# Patient Record
Sex: Male | Born: 1945 | Race: White | Hispanic: No | Marital: Married | Smoking: Former smoker
Health system: Southern US, Community
[De-identification: ages and names within clinical notes are randomized; demographics above are authoritative.]

## PROBLEM LIST (undated history)

## (undated) DIAGNOSIS — R911 Solitary pulmonary nodule: Secondary | ICD-10-CM

## (undated) DIAGNOSIS — I10 Essential (primary) hypertension: Secondary | ICD-10-CM

## (undated) DIAGNOSIS — I495 Sick sinus syndrome: Secondary | ICD-10-CM

## (undated) DIAGNOSIS — I499 Cardiac arrhythmia, unspecified: Secondary | ICD-10-CM

## (undated) DIAGNOSIS — I6529 Occlusion and stenosis of unspecified carotid artery: Secondary | ICD-10-CM

## (undated) DIAGNOSIS — C20 Malignant neoplasm of rectum: Secondary | ICD-10-CM

## (undated) DIAGNOSIS — I219 Acute myocardial infarction, unspecified: Secondary | ICD-10-CM

## (undated) DIAGNOSIS — R569 Unspecified convulsions: Secondary | ICD-10-CM

## (undated) DIAGNOSIS — E875 Hyperkalemia: Secondary | ICD-10-CM

## (undated) DIAGNOSIS — J45909 Unspecified asthma, uncomplicated: Secondary | ICD-10-CM

## (undated) DIAGNOSIS — I739 Peripheral vascular disease, unspecified: Secondary | ICD-10-CM

## (undated) DIAGNOSIS — M1990 Unspecified osteoarthritis, unspecified site: Secondary | ICD-10-CM

## (undated) DIAGNOSIS — J449 Chronic obstructive pulmonary disease, unspecified: Secondary | ICD-10-CM

## (undated) DIAGNOSIS — H269 Unspecified cataract: Secondary | ICD-10-CM

## (undated) DIAGNOSIS — I209 Angina pectoris, unspecified: Secondary | ICD-10-CM

## (undated) DIAGNOSIS — B029 Zoster without complications: Secondary | ICD-10-CM

## (undated) DIAGNOSIS — I2510 Atherosclerotic heart disease of native coronary artery without angina pectoris: Secondary | ICD-10-CM

## (undated) DIAGNOSIS — E785 Hyperlipidemia, unspecified: Secondary | ICD-10-CM

## (undated) DIAGNOSIS — I4892 Unspecified atrial flutter: Secondary | ICD-10-CM

## (undated) DIAGNOSIS — F419 Anxiety disorder, unspecified: Secondary | ICD-10-CM

## (undated) DIAGNOSIS — C61 Malignant neoplasm of prostate: Secondary | ICD-10-CM

## (undated) DIAGNOSIS — R55 Syncope and collapse: Secondary | ICD-10-CM

## (undated) HISTORY — DX: Atherosclerotic heart disease of native coronary artery without angina pectoris: I25.10

## (undated) HISTORY — DX: Essential (primary) hypertension: I10

## (undated) HISTORY — DX: Malignant neoplasm of rectum: C20

## (undated) HISTORY — DX: Peripheral vascular disease, unspecified: I73.9

## (undated) HISTORY — PX: KNEE ARTHROSCOPY: SUR90

## (undated) HISTORY — DX: Sick sinus syndrome: I49.5

## (undated) HISTORY — DX: Occlusion and stenosis of unspecified carotid artery: I65.29

## (undated) HISTORY — DX: Zoster without complications: B02.9

## (undated) HISTORY — DX: Unspecified atrial flutter: I48.92

## (undated) HISTORY — DX: Chronic obstructive pulmonary disease, unspecified: J44.9

## (undated) HISTORY — PX: CORONARY ANGIOPLASTY: SHX604

## (undated) HISTORY — PX: OTHER SURGICAL HISTORY: SHX169

## (undated) HISTORY — PX: CATARACT EXTRACTION: SUR2

## (undated) HISTORY — DX: Malignant neoplasm of prostate: C61

## (undated) HISTORY — DX: Syncope and collapse: R55

## (undated) HISTORY — DX: Solitary pulmonary nodule: R91.1

## (undated) HISTORY — PX: COLON SURGERY: SHX602

## (undated) HISTORY — DX: Hyperlipidemia, unspecified: E78.5

## (undated) HISTORY — DX: Unspecified cataract: H26.9

---

## 2003-10-31 ENCOUNTER — Ambulatory Visit: Payer: Self-pay | Admitting: Internal Medicine

## 2003-12-02 ENCOUNTER — Ambulatory Visit: Payer: Self-pay | Admitting: Internal Medicine

## 2003-12-31 ENCOUNTER — Ambulatory Visit: Payer: Self-pay | Admitting: Internal Medicine

## 2004-02-24 ENCOUNTER — Ambulatory Visit: Payer: Self-pay | Admitting: Internal Medicine

## 2004-03-02 ENCOUNTER — Ambulatory Visit: Payer: Self-pay | Admitting: Internal Medicine

## 2004-04-20 ENCOUNTER — Ambulatory Visit: Payer: Self-pay | Admitting: Internal Medicine

## 2004-04-30 ENCOUNTER — Ambulatory Visit: Payer: Self-pay | Admitting: Internal Medicine

## 2004-05-30 ENCOUNTER — Ambulatory Visit: Payer: Self-pay | Admitting: Internal Medicine

## 2004-06-30 ENCOUNTER — Ambulatory Visit: Payer: Self-pay | Admitting: Internal Medicine

## 2004-07-30 ENCOUNTER — Ambulatory Visit: Payer: Self-pay | Admitting: Internal Medicine

## 2004-09-07 ENCOUNTER — Ambulatory Visit: Payer: Self-pay | Admitting: Internal Medicine

## 2004-09-30 ENCOUNTER — Ambulatory Visit: Payer: Self-pay | Admitting: Internal Medicine

## 2004-10-30 ENCOUNTER — Ambulatory Visit: Payer: Self-pay | Admitting: Internal Medicine

## 2004-12-01 ENCOUNTER — Ambulatory Visit: Payer: Self-pay | Admitting: Internal Medicine

## 2004-12-30 ENCOUNTER — Ambulatory Visit: Payer: Self-pay | Admitting: Internal Medicine

## 2005-01-30 ENCOUNTER — Ambulatory Visit: Payer: Self-pay | Admitting: Internal Medicine

## 2005-03-02 ENCOUNTER — Other Ambulatory Visit: Payer: Self-pay

## 2005-03-03 ENCOUNTER — Inpatient Hospital Stay: Payer: Self-pay | Admitting: Internal Medicine

## 2005-03-03 ENCOUNTER — Other Ambulatory Visit: Payer: Self-pay

## 2005-03-09 ENCOUNTER — Ambulatory Visit: Payer: Self-pay | Admitting: Internal Medicine

## 2005-03-21 ENCOUNTER — Other Ambulatory Visit: Payer: Self-pay

## 2005-03-21 ENCOUNTER — Inpatient Hospital Stay: Payer: Self-pay | Admitting: Cardiology

## 2005-03-22 ENCOUNTER — Other Ambulatory Visit: Payer: Self-pay

## 2005-03-30 ENCOUNTER — Ambulatory Visit: Payer: Self-pay | Admitting: Internal Medicine

## 2005-04-30 ENCOUNTER — Ambulatory Visit: Payer: Self-pay | Admitting: Internal Medicine

## 2005-05-30 ENCOUNTER — Ambulatory Visit: Payer: Self-pay | Admitting: Internal Medicine

## 2005-06-30 ENCOUNTER — Ambulatory Visit: Payer: Self-pay | Admitting: Internal Medicine

## 2005-07-29 ENCOUNTER — Inpatient Hospital Stay: Payer: Self-pay | Admitting: Internal Medicine

## 2005-07-29 ENCOUNTER — Other Ambulatory Visit: Payer: Self-pay

## 2005-07-30 ENCOUNTER — Ambulatory Visit: Payer: Self-pay | Admitting: Internal Medicine

## 2005-08-30 ENCOUNTER — Ambulatory Visit: Payer: Self-pay | Admitting: Internal Medicine

## 2005-09-30 ENCOUNTER — Ambulatory Visit: Payer: Self-pay | Admitting: Internal Medicine

## 2005-10-30 ENCOUNTER — Ambulatory Visit: Payer: Self-pay | Admitting: Internal Medicine

## 2005-11-30 ENCOUNTER — Ambulatory Visit: Payer: Self-pay | Admitting: Internal Medicine

## 2005-12-30 ENCOUNTER — Ambulatory Visit: Payer: Self-pay | Admitting: Internal Medicine

## 2006-02-07 ENCOUNTER — Ambulatory Visit: Payer: Self-pay | Admitting: Internal Medicine

## 2006-03-02 ENCOUNTER — Ambulatory Visit: Payer: Self-pay | Admitting: Internal Medicine

## 2006-03-31 ENCOUNTER — Ambulatory Visit: Payer: Self-pay | Admitting: Internal Medicine

## 2006-04-05 ENCOUNTER — Other Ambulatory Visit: Payer: Self-pay

## 2006-04-05 ENCOUNTER — Ambulatory Visit: Payer: Self-pay | Admitting: Internal Medicine

## 2006-05-01 ENCOUNTER — Ambulatory Visit: Payer: Self-pay | Admitting: Internal Medicine

## 2006-05-31 ENCOUNTER — Ambulatory Visit: Payer: Self-pay | Admitting: Internal Medicine

## 2006-07-01 ENCOUNTER — Ambulatory Visit: Payer: Self-pay | Admitting: Internal Medicine

## 2006-07-31 ENCOUNTER — Ambulatory Visit: Payer: Self-pay | Admitting: Internal Medicine

## 2006-09-05 ENCOUNTER — Ambulatory Visit: Payer: Self-pay | Admitting: Internal Medicine

## 2006-10-01 ENCOUNTER — Ambulatory Visit: Payer: Self-pay | Admitting: Internal Medicine

## 2006-10-17 ENCOUNTER — Ambulatory Visit: Payer: Self-pay | Admitting: Internal Medicine

## 2006-10-31 ENCOUNTER — Ambulatory Visit: Payer: Self-pay | Admitting: Internal Medicine

## 2006-12-01 ENCOUNTER — Ambulatory Visit: Payer: Self-pay | Admitting: Internal Medicine

## 2006-12-31 ENCOUNTER — Ambulatory Visit: Payer: Self-pay | Admitting: Internal Medicine

## 2007-01-31 ENCOUNTER — Ambulatory Visit: Payer: Self-pay | Admitting: Internal Medicine

## 2007-02-18 ENCOUNTER — Ambulatory Visit: Payer: Self-pay | Admitting: Internal Medicine

## 2007-03-03 ENCOUNTER — Ambulatory Visit: Payer: Self-pay | Admitting: Internal Medicine

## 2007-03-15 ENCOUNTER — Ambulatory Visit: Payer: Self-pay | Admitting: Surgery

## 2007-03-31 ENCOUNTER — Ambulatory Visit: Payer: Self-pay | Admitting: Internal Medicine

## 2007-05-01 ENCOUNTER — Ambulatory Visit: Payer: Self-pay | Admitting: Internal Medicine

## 2007-05-31 ENCOUNTER — Ambulatory Visit: Payer: Self-pay | Admitting: Internal Medicine

## 2007-07-01 ENCOUNTER — Ambulatory Visit: Payer: Self-pay | Admitting: Internal Medicine

## 2007-07-23 ENCOUNTER — Ambulatory Visit: Payer: Self-pay | Admitting: Internal Medicine

## 2007-07-31 ENCOUNTER — Ambulatory Visit: Payer: Self-pay | Admitting: Internal Medicine

## 2007-08-31 ENCOUNTER — Ambulatory Visit: Payer: Self-pay | Admitting: Internal Medicine

## 2007-09-25 ENCOUNTER — Ambulatory Visit: Payer: Self-pay | Admitting: Internal Medicine

## 2008-07-27 ENCOUNTER — Emergency Department: Payer: Self-pay | Admitting: Internal Medicine

## 2008-08-18 ENCOUNTER — Ambulatory Visit: Payer: Self-pay | Admitting: Family Medicine

## 2009-01-30 HISTORY — PX: CARDIAC SURGERY: SHX584

## 2009-01-30 HISTORY — PX: INSERT / REPLACE / REMOVE PACEMAKER: SUR710

## 2009-07-30 HISTORY — PX: CARDIAC CATHETERIZATION: SHX172

## 2009-08-07 ENCOUNTER — Inpatient Hospital Stay: Payer: Self-pay | Admitting: *Deleted

## 2010-01-13 ENCOUNTER — Ambulatory Visit: Payer: Self-pay | Admitting: Cardiology

## 2010-01-17 ENCOUNTER — Ambulatory Visit: Payer: Self-pay | Admitting: Cardiology

## 2010-01-17 HISTORY — PX: PACEMAKER PLACEMENT: SHX43

## 2010-01-30 HISTORY — PX: OTHER SURGICAL HISTORY: SHX169

## 2010-08-05 ENCOUNTER — Ambulatory Visit: Payer: Self-pay | Admitting: Oncology

## 2010-08-31 ENCOUNTER — Ambulatory Visit: Payer: Self-pay | Admitting: Oncology

## 2010-09-30 ENCOUNTER — Ambulatory Visit: Payer: Self-pay | Admitting: Surgery

## 2010-10-05 LAB — PATHOLOGY REPORT

## 2010-10-19 ENCOUNTER — Ambulatory Visit: Payer: Self-pay | Admitting: Ophthalmology

## 2010-10-31 ENCOUNTER — Ambulatory Visit: Payer: Self-pay | Admitting: Ophthalmology

## 2011-09-20 ENCOUNTER — Ambulatory Visit: Payer: Self-pay | Admitting: Family Medicine

## 2011-11-01 ENCOUNTER — Emergency Department: Payer: Self-pay | Admitting: Emergency Medicine

## 2012-03-27 ENCOUNTER — Ambulatory Visit: Payer: Self-pay | Admitting: Specialist

## 2012-05-25 ENCOUNTER — Emergency Department: Payer: Self-pay | Admitting: Emergency Medicine

## 2012-05-25 LAB — COMPREHENSIVE METABOLIC PANEL
Alkaline Phosphatase: 78 U/L (ref 50–136)
BUN: 13 mg/dL (ref 7–18)
Bilirubin,Total: 0.4 mg/dL (ref 0.2–1.0)
Calcium, Total: 8.4 mg/dL — ABNORMAL LOW (ref 8.5–10.1)
Co2: 25 mmol/L (ref 21–32)
Creatinine: 0.71 mg/dL (ref 0.60–1.30)
EGFR (African American): >60
EGFR (Non-African Amer.): >60
Osmolality: 275 (ref 275–301)
Potassium: 3.7 mmol/L (ref 3.5–5.1)
SGPT (ALT): 36 U/L (ref 12–78)
Sodium: 137 mmol/L (ref 136–145)
Total Protein: 6.7 g/dL (ref 6.4–8.2)

## 2012-05-25 LAB — CBC
HCT: 43.8 % (ref 40.0–52.0)
HGB: 15.3 g/dL (ref 13.0–18.0)
MCHC: 35 g/dL (ref 32.0–36.0)
MCV: 95 fL (ref 80–100)
Platelet: 197 10*3/uL (ref 150–440)
RBC: 4.62 10*6/uL (ref 4.40–5.90)
RDW: 14.4 % (ref 11.5–14.5)

## 2012-05-25 LAB — URINALYSIS, COMPLETE
Bacteria: NONE SEEN
Blood: NEGATIVE
Glucose,UR: NEGATIVE mg/dL (ref 0–75)
Leukocyte Esterase: NEGATIVE
Nitrite: NEGATIVE
Ph: 5 (ref 4.5–8.0)
Protein: NEGATIVE
Specific Gravity: 1.016 (ref 1.003–1.030)

## 2012-05-25 LAB — TROPONIN I: Troponin-I: <0.02 ng/mL

## 2012-05-25 LAB — LIPASE, BLOOD: Lipase: 91 U/L (ref 73–393)

## 2012-10-10 ENCOUNTER — Ambulatory Visit: Payer: Self-pay | Admitting: Specialist

## 2012-10-12 ENCOUNTER — Observation Stay: Payer: Self-pay | Admitting: Student

## 2012-10-12 LAB — COMPREHENSIVE METABOLIC PANEL
Anion Gap: 4 — ABNORMAL LOW (ref 7–16)
Bilirubin,Total: 0.4 mg/dL (ref 0.2–1.0)
Calcium, Total: 9.1 mg/dL (ref 8.5–10.1)
Co2: 27 mmol/L (ref 21–32)
Glucose: 137 mg/dL — ABNORMAL HIGH (ref 65–99)
Osmolality: 277 (ref 275–301)
SGOT(AST): 35 U/L (ref 15–37)
SGPT (ALT): 40 U/L (ref 12–78)
Sodium: 138 mmol/L (ref 136–145)
Total Protein: 7.4 g/dL (ref 6.4–8.2)

## 2012-10-12 LAB — CBC
MCH: 31.9 pg (ref 26.0–34.0)
MCHC: 34.2 g/dL (ref 32.0–36.0)
MCV: 93 fL (ref 80–100)
Platelet: 201 10*3/uL (ref 150–440)
RBC: 4.93 10*6/uL (ref 4.40–5.90)
RDW: 13.3 % (ref 11.5–14.5)
WBC: 6.7 10*3/uL (ref 3.8–10.6)

## 2012-10-13 LAB — CBC WITH DIFFERENTIAL/PLATELET
Basophil #: 0 10*3/uL (ref 0.0–0.1)
Eosinophil %: 0 %
HCT: 47.7 % (ref 40.0–52.0)
Lymphocyte %: 4.3 %
MCH: 32.6 pg (ref 26.0–34.0)
MCHC: 34.7 g/dL (ref 32.0–36.0)
Monocyte #: 0.2 x10 3/mm (ref 0.2–1.0)
Monocyte %: 2.4 %
Neutrophil %: 93.2 %
RBC: 5.07 10*6/uL (ref 4.40–5.90)
RDW: 13.1 % (ref 11.5–14.5)
WBC: 9.6 10*3/uL (ref 3.8–10.6)

## 2012-10-13 LAB — BASIC METABOLIC PANEL
BUN: 17 mg/dL (ref 7–18)
Calcium, Total: 9.1 mg/dL (ref 8.5–10.1)
Chloride: 103 mmol/L (ref 98–107)
EGFR (Non-African Amer.): >60
Glucose: 154 mg/dL — ABNORMAL HIGH (ref 65–99)
Potassium: 3.9 mmol/L (ref 3.5–5.1)
Sodium: 135 mmol/L — ABNORMAL LOW (ref 136–145)

## 2013-01-15 ENCOUNTER — Emergency Department: Payer: Self-pay | Admitting: Emergency Medicine

## 2013-01-15 LAB — TROPONIN I: Troponin-I: <0.02 ng/mL

## 2013-01-15 LAB — BASIC METABOLIC PANEL
Calcium, Total: 9.1 mg/dL (ref 8.5–10.1)
Chloride: 105 mmol/L (ref 98–107)
Co2: 25 mmol/L (ref 21–32)
Creatinine: 0.82 mg/dL (ref 0.60–1.30)
EGFR (African American): >60
Glucose: 118 mg/dL — ABNORMAL HIGH (ref 65–99)
Osmolality: 275 (ref 275–301)
Potassium: 4.3 mmol/L (ref 3.5–5.1)

## 2013-01-15 LAB — PRO B NATRIURETIC PEPTIDE: B-Type Natriuretic Peptide: 138 pg/mL — ABNORMAL HIGH (ref 0–125)

## 2013-01-15 LAB — CBC
MCH: 31.9 pg (ref 26.0–34.0)
MCV: 93 fL (ref 80–100)
WBC: 6.9 10*3/uL (ref 3.8–10.6)

## 2013-01-30 DIAGNOSIS — I219 Acute myocardial infarction, unspecified: Secondary | ICD-10-CM

## 2013-01-30 HISTORY — PX: CARDIAC CATHETERIZATION: SHX172

## 2013-01-30 HISTORY — DX: Acute myocardial infarction, unspecified: I21.9

## 2013-02-18 ENCOUNTER — Ambulatory Visit: Payer: Self-pay | Admitting: Internal Medicine

## 2013-02-18 LAB — BASIC METABOLIC PANEL
Anion Gap: 3 — ABNORMAL LOW (ref 7–16)
BUN: 18 mg/dL (ref 7–18)
CHLORIDE: 106 mmol/L (ref 98–107)
Calcium, Total: 9.2 mg/dL (ref 8.5–10.1)
Co2: 28 mmol/L (ref 21–32)
Creatinine: 0.8 mg/dL (ref 0.60–1.30)
EGFR (African American): >60
GLUCOSE: 91 mg/dL (ref 65–99)
OSMOLALITY: 275 (ref 275–301)
Potassium: 4.8 mmol/L (ref 3.5–5.1)
SODIUM: 137 mmol/L (ref 136–145)

## 2013-03-28 ENCOUNTER — Encounter: Payer: Self-pay | Admitting: Pulmonary Disease

## 2013-03-28 ENCOUNTER — Ambulatory Visit (INDEPENDENT_AMBULATORY_CARE_PROVIDER_SITE_OTHER): Payer: Medicare Other | Admitting: Pulmonary Disease

## 2013-03-28 VITALS — BP 142/74 | HR 90 | Ht 69.0 in | Wt 229.0 lb

## 2013-03-28 DIAGNOSIS — J449 Chronic obstructive pulmonary disease, unspecified: Secondary | ICD-10-CM

## 2013-03-28 DIAGNOSIS — R911 Solitary pulmonary nodule: Secondary | ICD-10-CM | POA: Insufficient documentation

## 2013-03-28 DIAGNOSIS — J432 Centrilobular emphysema: Secondary | ICD-10-CM | POA: Insufficient documentation

## 2013-03-28 DIAGNOSIS — J441 Chronic obstructive pulmonary disease with (acute) exacerbation: Secondary | ICD-10-CM

## 2013-03-28 MED ORDER — ALBUTEROL SULFATE HFA 108 (90 BASE) MCG/ACT IN AERS: 2.0000 | INHALATION_SPRAY | Freq: Four times a day (QID) | RESPIRATORY_TRACT | Status: DC | PRN

## 2013-03-28 MED ORDER — ROFLUMILAST 500 MCG PO TABS: 500.0000 ug | ORAL_TABLET | Freq: Every day | ORAL | Status: DC

## 2013-03-28 NOTE — Assessment & Plan Note (Addendum)
I am very concerned about all the recurrent exacerbations Donald Juarez has experienced in his COPD lately. I do not know the severity based on spirometry but clinically he has severe disease. We need to try to identify underlying conditions that may be contributing. I first questioned MAI versus ABPA but as these are rare conditions, the most likely explanation is weight gain and effects of aging.  Plan:  -Obtain full pulmonary function test -IgE -CBC with differential with eosinophils -continue Spiriva and Advair as these are more than adequate therapy for COPD. -Add Roflumilast for ongoing severe infections

## 2013-03-28 NOTE — Assessment & Plan Note (Signed)
He has had 2 CT chests since February of 2014 which showed a 7 mm nodule which has not grown. The most recent study recommended a six-month followup. We will order a CT chest without contrast to evaluate this nodule.

## 2013-03-28 NOTE — Progress Notes (Signed)
Subjective:    Patient ID: Donald Juarez, male    DOB: 1945/06/12, 68 y.o.   MRN: [id number]  HPI  PCP: Dr. Ozella Juarez  Mr. Donald Juarez has COPD and is here to see me because started having more dyspnea in 9/204 after experiencing shigles for about 6 months.  He was atually admitted for what sounds like a COPD flare at that time. Not long after that he started having recurrent COPD flares that required admission again and visits with his PCP. He has beeen seen by his cardiology doctor and had a repeat stress and left heart cath which were abnormal.  Before 09/2012 he might have one or two episodes of pneumonia ever.  He says that when he climbs one flight of stairs he has a bad episode of dyspnea.  His wife is disabled and he does all the work around his house.  He is really struggling to keep up right now.  In addition to dyspnea he has noticed fatigue, some malaise and poor sleep quality.  He is not coughing more.  He does have more chest congestion which is clear.   He has ben noted to have a pulmonary nodule in the past and went through serial CT scans for this.    He previously smoked 1 pack per day for 30 years an quit 4 years ago.    He used moradic acid one time to clean ceramic floors in a room poorly ventilated.  This was in the 1990's, after that he has had a lot of trouble with his breathing.    He has been treated with prednisone and this really helps a lot. As soon as he comes off he starts to change worse.    He has been on symbicort and Spiriva for years. He also uses an albuterol nebulizer a few times per day and it helps.    Past Medical History  Diagnosis Date  . COPD (chronic obstructive pulmonary disease)   . Heart disease   . Hx of colon cancer, stage I   . Hypertension   . Irregular heartbeat      Family History  Problem Relation Age of Onset  . Cancer Mother     'male cancer"  . Heart disease Father      History   Social History  . Marital Status:  Married    Spouse Name: N/A    Number of Children: N/A  . Years of Education: N/A   Occupational History  . Not on file.   Social History Main Topics  . Smoking status: Former Smoker -- 1.00 packs/day for 30 years    Types: Cigarettes    Quit date: 07/26/2008  . Smokeless tobacco: Never Used  . Alcohol Use: No  . Drug Use: No  . Sexual Activity: Not on file   Other Topics Concern  . Not on file   Social History Narrative  . No narrative on file     Not on File   No outpatient prescriptions prior to visit.   No facility-administered medications prior to visit.      Review of Systems  Constitutional: Negative for fever and unexpected weight change.  HENT: Positive for congestion, postnasal drip and sinus pressure. Negative for dental problem, ear pain, nosebleeds, rhinorrhea, sneezing, sore throat and trouble swallowing.   Eyes: Negative for redness and itching.  Respiratory: Positive for cough and shortness of breath. Negative for chest tightness and wheezing.   Cardiovascular: Negative for palpitations and  leg swelling.  Gastrointestinal: Negative for nausea and vomiting.  Genitourinary: Negative for dysuria.  Musculoskeletal: Negative for joint swelling.  Skin: Negative for rash.  Neurological: Negative for headaches.  Hematological: Does not bruise/bleed easily.  Psychiatric/Behavioral: Negative for dysphoric mood. The patient is not nervous/anxious.        Objective:   Physical Exam  Filed Vitals:   03/28/13 1338 03/28/13 1431  BP: 142/74   Pulse:  90  Height: 5\' 9"  (1.753 m)   Weight: 229 lb (103.874 kg)   SpO2:  95%   Gen: well appearing, no acute distress HEENT: NCAT, PERRL, EOMi, OP clear, neck supple without masses PULM: few crackles in bases with exp wheezing CV: RRR, no mgr, no JVD AB: BS+, soft, nontender, no hsm Ext: warm, no edema, no clubbing, no cyanosis Derm: no rash or skin breakdown Neuro: A&Ox4, CN II-XII intact, strength 5/5 in  all 4 extremities        Assessment & Plan:   COPD, moderate I am very concerned about all the recurrent exacerbations Donald Juarez has experienced in his COPD lately. I do not know the severity based on spirometry but clinically he has severe disease. We need to try to identify underlying conditions that may be contributing. I first questioned MAI versus ABPA but as these are rare conditions, the most likely explanation is weight gain and effects of aging.  Plan:  -Obtain full pulmonary function test -IgE -CBC with differential with eosinophils -continue Spiriva and Advair as these are more than adequate therapy for COPD. -Add Roflumilast for ongoing severe infections  Solitary pulmonary nodule He has had 2 CT chests since February of 2014 which showed a 7 mm nodule which has not grown. The most recent study recommended a six-month followup. We will order a CT chest without contrast to evaluate this nodule.    Updated Medication List Outpatient Encounter Prescriptions as of 03/28/2013  Medication Sig  . albuterol (PROVENTIL HFA;VENTOLIN HFA) 108 (90 BASE) MCG/ACT inhaler Inhale 1-2 puffs into the lungs every 6 (six) hours as needed for wheezing or shortness of breath.  [name] albuterol (PROVENTIL) (2.5 MG/3ML) 0.083% nebulizer solution Take 2.5 mg by nebulization every 6 (six) hours as needed for wheezing or shortness of breath.  [name] apixaban (ELIQUIS) 5 MG TABS tablet Take 5 mg by mouth 2 (two) times daily.  [name] aspirin 81 MG tablet Take 81 mg by mouth daily.  [name] atorvastatin (LIPITOR) 40 MG tablet Take 40 mg by mouth daily.  . budesonide-formoterol (SYMBICORT) 160-4.5 MCG/ACT inhaler Inhale 2 puffs into the lungs 2 (two) times daily.  [name] diltiazem (TIAZAC) 300 MG 24 hr capsule Take 300 mg by mouth daily.  . isosorbide dinitrate (ISORDIL) 30 MG tablet Take 30 mg by mouth daily.  . metoprolol (LOPRESSOR) 100 MG tablet Take 100 mg by mouth 2 (two) times daily.  [name] tiotropium (SPIRIVA) 18 MCG  inhalation capsule Place 18 mcg into inhaler and inhale daily.

## 2013-03-28 NOTE — Patient Instructions (Signed)
Take the daliresp daily; if you experience upset stomach, take it every other day Keep taking your inhalers as you are doing Provide [place] three samples of mucus We will set up a lung function test for you  We will see you back in 6-8 weeks or sooner if needed (after the culture results are back)

## 2013-04-01 ENCOUNTER — Other Ambulatory Visit: Payer: Self-pay | Admitting: Pulmonary Disease

## 2013-04-01 ENCOUNTER — Ambulatory Visit: Payer: Self-pay | Admitting: Pulmonary Disease

## 2013-04-01 ENCOUNTER — Other Ambulatory Visit: Payer: Medicare Other

## 2013-04-01 LAB — PULMONARY FUNCTION TEST

## 2013-04-03 ENCOUNTER — Telehealth: Payer: Self-pay | Admitting: Pulmonary Disease

## 2013-04-03 NOTE — Telephone Encounter (Signed)
Dr. Lake Bells placed an order for a battery operated nebulizer machine with [facility] for the pt. Mandy called to let us know that they will have to order the machine for the pt. When [facility] called the pt to let him know, he got upset and told them that he didn't want to wait. Leafy Ro states that it will not take long to get this machine in. I attempted to call the pt. Left a message from him to call back and try to explain to him that it will not take long to get the machine.

## 2013-04-04 NOTE — Telephone Encounter (Signed)
Spoke with pt. He denies telling [facility] that he didn't want to wait on the battery operated neb machine. [facility] told him that they couldn't get the machine. I have cleared this up and let him know that they are ordering the machine and he should get it shortly.  Called Mandy to clarify that this can still be ordered. She states that they have already ordered this and will get to the pt as soon as they get it in. Nothing further is needed.

## 2013-04-04 NOTE — Telephone Encounter (Signed)
[id number] on cell number. Home number listed is disconnected.Crockett Bing, CMA

## 2013-04-09 ENCOUNTER — Encounter: Payer: Self-pay | Admitting: Pulmonary Disease

## 2013-04-10 ENCOUNTER — Telehealth: Payer: Self-pay

## 2013-04-10 NOTE — Telephone Encounter (Signed)
Pt aware of results and recs.  Nothing further needed. 

## 2013-04-10 NOTE — Telephone Encounter (Signed)
Message copied by Len Blalock on Thu Apr 10, 2013  2:02 PM ------      Message from: Simonne Maffucci B      Created: Wed Apr 09, 2013  9:30 PM       A,      Please let him know that his PFTs showed COPD with asthma and there is nothing new to do today.      Thanks      B ------

## 2013-05-01 ENCOUNTER — Telehealth: Payer: Self-pay | Admitting: Pulmonary Disease

## 2013-05-01 MED ORDER — ROFLUMILAST 500 MCG PO TABS: 500.0000 ug | ORAL_TABLET | Freq: Every day | ORAL | Status: DC

## 2013-05-01 MED ORDER — ALBUTEROL SULFATE (2.5 MG/3ML) 0.083% IN NEBU: 2.5000 mg | INHALATION_SOLUTION | Freq: Four times a day (QID) | RESPIRATORY_TRACT | Status: DC | PRN

## 2013-05-01 NOTE — Telephone Encounter (Signed)
Called and spoke with pt and he stated that he will need the albuterol for the neb sent in to the pharmacy.  This has been done.  Pt stated that the daliresp the pharmacy did not have this rx.  rx has been sent in for the daliresp but this may need a PA done.  Will wait to hear from the pharmacy on this med.

## 2013-05-14 ENCOUNTER — Telehealth: Payer: Self-pay | Admitting: *Deleted

## 2013-05-14 LAB — AFB CULTURE WITH SMEAR (NOT AT ARMC): Acid Fast Smear: NONE SEEN

## 2013-05-14 NOTE — Telephone Encounter (Signed)
Called and did the PA for the daliresp 500 mg  1 tablet daily.  This has been approved until 05-16-2014.  i have called the pharmacy and they will get this ready for the pt.  Nothing further is needed.

## 2013-05-16 LAB — AFB CULTURE WITH SMEAR (NOT AT ARMC): ACID FAST SMEAR: NONE SEEN

## 2013-05-28 ENCOUNTER — Encounter: Payer: Self-pay | Admitting: Pulmonary Disease

## 2013-05-28 ENCOUNTER — Ambulatory Visit (INDEPENDENT_AMBULATORY_CARE_PROVIDER_SITE_OTHER): Payer: Medicare Other | Admitting: Pulmonary Disease

## 2013-05-28 VITALS — BP 138/82 | HR 107 | Ht 69.0 in | Wt 223.0 lb

## 2013-05-28 DIAGNOSIS — J45901 Unspecified asthma with (acute) exacerbation: Secondary | ICD-10-CM

## 2013-05-28 DIAGNOSIS — J449 Chronic obstructive pulmonary disease, unspecified: Secondary | ICD-10-CM

## 2013-05-28 DIAGNOSIS — R911 Solitary pulmonary nodule: Secondary | ICD-10-CM

## 2013-05-28 MED ORDER — AEROCHAMBER MV MISC: Status: DC

## 2013-05-28 NOTE — Progress Notes (Signed)
Subjective:    Patient ID: Donald Juarez, male    DOB: 01-Mar-1945, 68 y.o.   MRN: [id number]  Synopsis: Mr. Belson first saw the [facility] clinic in 2014 for COPD after smoking 1 PPD for 30 years and quitting in 2010.  He has frequent exacerbations.    03/2013 Full PFT> Ratio 53% FEV1 1.89 (65% pred, 13% change), TLC 5.83 L (93% pred), DLCO 16.6 (69% pred)  HPI   05/28/2013 [name] > Since the last visit Javon ended up in an urgent care facility again with dyspnea, runny nose, cough, sinus congestion and itching eyes.  He was put on a prednisone taper, flonase.  He now feels much better.  He was doing Ok prior to this and felt like his symptoms had initially improved after our last visit.  For some reason he did not get his lab work.  He continues to cough up clear mucus.  He has been using his albuterol more in the last week or so with the sinus symptoms.  He has noticed increasing dyspnea with walking to the mailbox.  He has been taking his Symbicort twice a day but feels like sometimes he "doesn't get any" when he uses the inhaler.  Past Medical History  Diagnosis Date  . COPD (chronic obstructive pulmonary disease)   . Heart disease   . Hx of colon cancer, stage I   . Hypertension   . Irregular heartbeat      Review of Systems  Constitutional: Positive for fatigue. Negative for fever and chills.  HENT: Positive for postnasal drip, rhinorrhea and sinus pressure.   Respiratory: Positive for cough, shortness of breath and wheezing.   Cardiovascular: Negative for chest pain, palpitations and leg swelling.       Objective:   Physical Exam Filed Vitals:   05/28/13 0937  BP: 138/82  Pulse: 107  Height: 5\' 9"  (1.753 m)  Weight: 223 lb (101.152 kg)  SpO2: 96%    RA  Gen: well appearing, no acute distress HEENT: NCAT, EOMi, OP clear PULM: few exp wheezes, good air movement CV: RRR, no mgr, no JVD AB: BS+, soft, nontender Ext: warm, no edema, no clubbing, no  cyanosis Derm: no rash or skin breakdown Neuro: A&Ox4, MAEW       Assessment & Plan:   Solitary pulmonary nodule For some reason he did not have the CT chest we ordered after the last visit (nor did he have the blood work).  Will order after next visit.  COPD with asthma I am concerned about his ongoing exacerbations (though the most recent seemed more sinus related).  He is not interested in [facility] due to cost.  I explained the importance of adherence to his medication regimen, but that he needed to stick to the inhalers.  I wonder if he is really getting anything from the Symbicort with is current strategy.  Will add a spacer to see if that helps with drug delivery.  Plan: -continue prednisone taper -2 weeks after prednisone taper, get cbc with diff and IgE -add spacer to symbicort -continue Spiriva -discuss restarting Daliresp (applying for assistance) next visit    Updated Medication List Outpatient Encounter Prescriptions as of 05/28/2013  Medication Sig  . albuterol (PROAIR HFA) 108 (90 BASE) MCG/ACT inhaler Inhale 2 puffs into the lungs every 6 (six) hours as needed for wheezing or shortness of breath.  [name] albuterol (PROVENTIL) (2.5 MG/3ML) 0.083% nebulizer solution Take 3 mLs (2.5 mg total) by nebulization every  6 (six) hours as needed for wheezing or shortness of breath.  [name] apixaban (ELIQUIS) 5 MG TABS tablet Take 5 mg by mouth 2 (two) times daily.  [name] aspirin 81 MG tablet Take 81 mg by mouth daily.  [name] atorvastatin (LIPITOR) 40 MG tablet Take 40 mg by mouth daily.  . budesonide-formoterol (SYMBICORT) 160-4.5 MCG/ACT inhaler Inhale 2 puffs into the lungs 2 (two) times daily.  [name] diltiazem (TIAZAC) 300 MG 24 hr capsule Take 300 mg by mouth daily.  . fluticasone (FLONASE) 50 MCG/ACT nasal spray Place 2 sprays into both nostrils daily.  . isosorbide dinitrate (ISORDIL) 30 MG tablet Take 30 mg by mouth daily.  . metoprolol (LOPRESSOR) 100 MG tablet Take 100 mg by mouth 2 (two)  times daily.  . predniSONE (DELTASONE) 10 MG tablet Taper as directed  . tiotropium (SPIRIVA) 18 MCG inhalation capsule Place 18 mcg into inhaler and inhale daily.  . [DISCONTINUED] roflumilast (DALIRESP) 500 MCG TABS tablet Take 1 tablet (500 mcg total) by mouth daily.

## 2013-05-28 NOTE — Patient Instructions (Signed)
Take your symbicort with a spacer device two puffs twice a day no matter how you feel Continue using the symbicort for two weeks after completing the prednisone taper and let us know how it is working for you Get blood work done at our office two weeks after completing the prednisone We will see you back in 6 weeks

## 2013-05-29 NOTE — Assessment & Plan Note (Addendum)
I am concerned about his ongoing exacerbations (though the most recent seemed more sinus related).  He is not interested in [facility] due to cost.  I explained the importance of adherence to his medication regimen, but that he needed to stick to the inhalers.  I wonder if he is really getting anything from the Symbicort with is current strategy.  Will add a spacer to see if that helps with drug delivery.  Plan: -continue prednisone taper -2 weeks after prednisone taper, get cbc with diff and IgE -add spacer to symbicort -continue Spiriva -discuss restarting Daliresp (applying for assistance) next visit

## 2013-05-29 NOTE — Assessment & Plan Note (Signed)
For some reason he did not have the CT chest we ordered after the last visit (nor did he have the blood work).  Will order after next visit.

## 2013-06-02 ENCOUNTER — Encounter: Payer: Self-pay | Admitting: Pulmonary Disease

## 2013-06-24 ENCOUNTER — Telehealth: Payer: Self-pay | Admitting: Pulmonary Disease

## 2013-06-24 ENCOUNTER — Other Ambulatory Visit (INDEPENDENT_AMBULATORY_CARE_PROVIDER_SITE_OTHER): Payer: Medicare Other

## 2013-06-24 ENCOUNTER — Encounter: Payer: Self-pay | Admitting: Pulmonary Disease

## 2013-06-24 ENCOUNTER — Ambulatory Visit (INDEPENDENT_AMBULATORY_CARE_PROVIDER_SITE_OTHER): Payer: Medicare Other | Admitting: Pulmonary Disease

## 2013-06-24 VITALS — BP 140/72 | HR 121 | Ht 69.0 in | Wt 225.0 lb

## 2013-06-24 DIAGNOSIS — R911 Solitary pulmonary nodule: Secondary | ICD-10-CM

## 2013-06-24 DIAGNOSIS — J449 Chronic obstructive pulmonary disease, unspecified: Secondary | ICD-10-CM

## 2013-06-24 LAB — CBC WITH DIFFERENTIAL/PLATELET
BASOS PCT: 0.2 % (ref 0.0–3.0)
Basophils Absolute: 0 10*3/uL (ref 0.0–0.1)
EOS PCT: 1.5 % (ref 0.0–5.0)
Eosinophils Absolute: 0.2 10*3/uL (ref 0.0–0.7)
HEMATOCRIT: 46.4 % (ref 39.0–52.0)
HEMOGLOBIN: 16 g/dL (ref 13.0–17.0)
LYMPHS ABS: 1.3 10*3/uL (ref 0.7–4.0)
Lymphocytes Relative: 12.7 % (ref 12.0–46.0)
MCHC: 34.4 g/dL (ref 30.0–36.0)
MCV: 94.4 fl (ref 78.0–100.0)
MONOS PCT: 6.9 % (ref 3.0–12.0)
Monocytes Absolute: 0.7 10*3/uL (ref 0.1–1.0)
NEUTROS ABS: 8.2 10*3/uL — AB (ref 1.4–7.7)
Neutrophils Relative %: 78.7 % — ABNORMAL HIGH (ref 43.0–77.0)
Platelets: 271 10*3/uL (ref 150.0–400.0)
RBC: 4.92 Mil/uL (ref 4.22–5.81)
RDW: 14.2 % (ref 11.5–15.5)
WBC: 10.4 10*3/uL (ref 4.0–10.5)

## 2013-06-24 MED ORDER — PREDNISONE 10 MG PO TABS: 10.0000 mg | ORAL_TABLET | Freq: Every day | ORAL | Status: DC

## 2013-06-24 MED ORDER — BUDESONIDE 0.5 MG/2ML IN SUSP: 0.5000 mg | Freq: Two times a day (BID) | RESPIRATORY_TRACT | Status: DC

## 2013-06-24 MED ORDER — ARFORMOTEROL TARTRATE 15 MCG/2ML IN NEBU: 15.0000 ug | INHALATION_SOLUTION | Freq: Two times a day (BID) | RESPIRATORY_TRACT | Status: DC

## 2013-06-24 MED ORDER — TRAMADOL HCL 50 MG PO TABS: 50.0000 mg | ORAL_TABLET | Freq: Four times a day (QID) | ORAL | Status: DC | PRN

## 2013-06-24 NOTE — Patient Instructions (Addendum)
Take the brovana and pulmicort twice a day no matter how you feel Take the tramadol as needed for cough every 6 hours Take the prednisone as prescribed We will arrange a CT chest for you at [facility] We will arrange pulmonary rehab We will see you back in 4 weeks or sooner if needed

## 2013-06-24 NOTE — Assessment & Plan Note (Signed)
Donald Juarez has really been struggling lately. I question his compliance with his medicines but he claims that they have not been helping.  Today I will not let him leave until we get some blood work to check for eosinophils and an IgE (we have been trying to get this for several weeks now).  We also need to get a CT chest to followup the pulmonary nodule and make sure there is nothing else going on.  Plan: -Start pulmonary rehabilitation -Slow prednisone taper after blood work today - Tramadol as needed for cough> 0 refills provided -Change Symbicort 2 nebulized brovana and Pulmicort - Followup 4 weeks

## 2013-06-24 NOTE — Telephone Encounter (Signed)
Spoke with [employer]. She is calling to let us know that the pt will need alternatives for Brovana and Budesonide. Garlon Hatchet is going to cost $130, Budesonide is going to cost $95. Pt was previously on Symbicort which only cost him $45.  BQ -  Please advise. Thanks.

## 2013-06-24 NOTE — Assessment & Plan Note (Signed)
Repeat CT chest now for a final evaluation of the pulmonary nodule.

## 2013-06-24 NOTE — Progress Notes (Signed)
Subjective:    Patient ID: Donald Juarez, male    DOB: September 01, 1945, 68 y.o.   MRN: [id number]  Synopsis: Donald Juarez first saw the [facility] Pulmonary clinic in 2014 for COPD after smoking 1 PPD for 30 years and quitting in 2010.  He has frequent exacerbations.    03/2013 Full PFT> Ratio 53% FEV1 1.89 (65% pred, 13% change), TLC 5.83 L (93% pred), DLCO 16.6 (69% pred)  HPI    05/28/2013 [name] > Since the last visit Donald Juarez ended up in an urgent care facility again with dyspnea, runny nose, cough, sinus congestion and itching eyes.  He was put on a prednisone taper, flonase.  He now feels much better.  He was doing Ok prior to this and felt like his symptoms had initially improved after our last visit.  For some reason he did not get his lab work.  He continues to cough up clear mucus.  He has been using his albuterol more in the last week or so with the sinus symptoms.  He has noticed increasing dyspnea with walking to the mailbox.  He has been taking his Symbicort twice a day but feels like sometimes he "doesn't get any" when he uses the inhaler.  06/24/2013 [name] > Donald Juarez has really been struggling since the last visit. He continues to have shortness of breath with minimal activity. He coughs on a regular basis. He often has trouble producing mucus. The cough bothers him all day long. He has been on a prednisone taper since I saw in the last time. He has not had fevers or chills. He has gained a significant amount of weight and he says that this is contributing to his dyspnea. Albuterol helps when he takes it. He continues to take the Symbicort and the Spiriva. He says that it takes at least 3-4 hours for them to kick in and do any good.  Past Medical History  Diagnosis Date  . COPD (chronic obstructive pulmonary disease)   . Heart disease   . Hx of colon cancer, stage I   . Hypertension   . Irregular heartbeat      Review of Systems  Constitutional: Positive for fatigue. Negative for  fever and chills.  HENT: Positive for postnasal drip, rhinorrhea and sinus pressure.   Respiratory: Positive for cough, shortness of breath and wheezing.   Cardiovascular: Negative for chest pain, palpitations and leg swelling.       Objective:   Physical Exam  Filed Vitals:   06/24/13 1147  BP: 140/72  Pulse: 121  Height: 5\' 9"  (1.753 m)  Weight: 225 lb (102.059 kg)  SpO2: 95%    RA  Gen: well appearing, no acute distress HEENT: NCAT, EOMi, OP clear PULM: Wheezes bilaterally, limited air movement CV: RRR, no mgr, no JVD AB: BS+, soft, nontender Ext: warm, no edema, no clubbing, no cyanosis Derm: no rash or skin breakdown Neuro: A&Ox4, MAEW       Assessment & Plan:   COPD with asthma Donald Juarez has really been struggling lately. I question his compliance with his medicines but he claims that they have not been helping.  Today I will not let him leave until we get some blood work to check for eosinophils and an IgE (we have been trying to get this for several weeks now).  We also need to get a CT chest to followup the pulmonary nodule and make sure there is nothing else going on.  Plan: -Start pulmonary rehabilitation -Slow  prednisone taper after blood work today - Tramadol as needed for cough> 0 refills provided -Change Symbicort 2 nebulized brovana and Pulmicort - Followup 4 weeks  Solitary pulmonary nodule Repeat CT chest now for a final evaluation of the pulmonary nodule.    Updated Medication List Outpatient Encounter Prescriptions as of 06/24/2013  Medication Sig  . albuterol (PROAIR HFA) 108 (90 BASE) MCG/ACT inhaler Inhale 2 puffs into the lungs every 6 (six) hours as needed for wheezing or shortness of breath.  [name] albuterol (PROVENTIL) (2.5 MG/3ML) 0.083% nebulizer solution Take 3 mLs (2.5 mg total) by nebulization every 6 (six) hours as needed for wheezing or shortness of breath.  [name] apixaban (ELIQUIS) 5 MG TABS tablet Take 5 mg by mouth 2 (two) times  daily.  [name] aspirin 81 MG tablet Take 81 mg by mouth daily.  [name] atorvastatin (LIPITOR) 40 MG tablet Take 40 mg by mouth daily.  . budesonide-formoterol (SYMBICORT) 160-4.5 MCG/ACT inhaler Inhale 2 puffs into the lungs 2 (two) times daily.  [name] diltiazem (TIAZAC) 300 MG 24 hr capsule Take 300 mg by mouth daily.  . fluticasone (FLONASE) 50 MCG/ACT nasal spray Place 2 sprays into both nostrils daily.  . isosorbide dinitrate (ISORDIL) 30 MG tablet Take 30 mg by mouth daily.  . metoprolol (LOPRESSOR) 100 MG tablet Take 100 mg by mouth 2 (two) times daily.  [name] Spacer/Aero-Holding Chambers (AEROCHAMBER MV) inhaler Use as instructed  . tiotropium (SPIRIVA) 18 MCG inhalation capsule Place 18 mcg into inhaler and inhale daily.  . [DISCONTINUED] predniSONE (DELTASONE) 10 MG tablet Taper as directed

## 2013-06-25 MED ORDER — ARFORMOTEROL TARTRATE 15 MCG/2ML IN NEBU: 15.0000 ug | INHALATION_SOLUTION | Freq: Two times a day (BID) | RESPIRATORY_TRACT | Status: DC

## 2013-06-25 MED ORDER — BUDESONIDE 0.5 MG/2ML IN SUSP: 0.5000 mg | Freq: Two times a day (BID) | RESPIRATORY_TRACT | Status: DC

## 2013-06-25 NOTE — Telephone Encounter (Signed)
Let's switch the prescription to a DME like [facility] If the DME needs Perforomist instead of Brovana I am OK with that Please explain to the patient that it should be cheaper this way.

## 2013-06-25 NOTE — Telephone Encounter (Signed)
Spoke with Anderson Malta at [employer]. Advised her that we are going to go through a DME to get medications.  Rx will be printed and given to BQ to sign to get over to [facility]. Will route message to [place] to follow up on.

## 2013-06-25 NOTE — Telephone Encounter (Signed)
This has been signed by BQ and given to [facility] to fax accordingly.  Nothing further needed.

## 2013-06-27 ENCOUNTER — Emergency Department: Payer: Self-pay | Admitting: Emergency Medicine

## 2013-06-27 ENCOUNTER — Other Ambulatory Visit: Payer: Medicare Other

## 2013-06-27 LAB — CBC
HCT: 44.6 % (ref 40.0–52.0)
HGB: 15.2 g/dL (ref 13.0–18.0)
MCH: 32.4 pg (ref 26.0–34.0)
MCHC: 34 g/dL (ref 32.0–36.0)
MCV: 95 fL (ref 80–100)
Platelet: 241 10*3/uL (ref 150–440)
RBC: 4.68 10*6/uL (ref 4.40–5.90)
RDW: 14.3 % (ref 11.5–14.5)
WBC: 9.6 10*3/uL (ref 3.8–10.6)

## 2013-06-27 LAB — BASIC METABOLIC PANEL
ANION GAP: 8 (ref 7–16)
BUN: 23 mg/dL — ABNORMAL HIGH (ref 7–18)
CALCIUM: 8.7 mg/dL (ref 8.5–10.1)
Chloride: 108 mmol/L — ABNORMAL HIGH (ref 98–107)
Co2: 23 mmol/L (ref 21–32)
Creatinine: 0.7 mg/dL (ref 0.60–1.30)
EGFR (African American): >60
EGFR (Non-African Amer.): >60
GLUCOSE: 105 mg/dL — AB (ref 65–99)
Osmolality: 282 (ref 275–301)
Potassium: 4.3 mmol/L (ref 3.5–5.1)
SODIUM: 139 mmol/L (ref 136–145)

## 2013-06-27 LAB — PRO B NATRIURETIC PEPTIDE: B-Type Natriuretic Peptide: 236 pg/mL — ABNORMAL HIGH (ref 0–125)

## 2013-06-27 LAB — TROPONIN I

## 2013-06-30 ENCOUNTER — Ambulatory Visit: Payer: Self-pay | Admitting: Pulmonary Disease

## 2013-07-04 ENCOUNTER — Encounter: Payer: Self-pay | Admitting: Pulmonary Disease

## 2013-07-04 ENCOUNTER — Telehealth: Payer: Self-pay

## 2013-07-04 NOTE — Telephone Encounter (Signed)
Message copied by Len Blalock on Fri Jul 04, 2013 11:09 AM ------      Message from: Juanito Doom      Created: Fri Jul 04, 2013 10:11 AM       A,            Please let him know that his nodules haven't grown since 2008 so they are benign, no more CT's needed            Thanks      B ------

## 2013-07-04 NOTE — Telephone Encounter (Signed)
Pt aware of ct results and recs.  Nothing further needed.

## 2013-07-11 ENCOUNTER — Telehealth: Payer: Self-pay | Admitting: Pulmonary Disease

## 2013-07-11 NOTE — Telephone Encounter (Signed)
Called spoke with pt. He is set up with pulm rehab. He spoke with his insurance company and was told we needed to call them. Please advise PCC's thanks

## 2013-07-11 NOTE — Telephone Encounter (Signed)
Spoke to [email or web address]  pt does not need precert nor notification he is ok to go to pul rehab on [email or web address]  aware also [employer]

## 2013-07-15 ENCOUNTER — Encounter: Payer: Self-pay | Admitting: Pulmonary Disease

## 2013-07-16 ENCOUNTER — Encounter: Payer: Self-pay | Admitting: Pulmonary Disease

## 2013-07-18 ENCOUNTER — Ambulatory Visit (INDEPENDENT_AMBULATORY_CARE_PROVIDER_SITE_OTHER): Payer: Medicare Other | Admitting: Pulmonary Disease

## 2013-07-18 ENCOUNTER — Encounter: Payer: Self-pay | Admitting: Pulmonary Disease

## 2013-07-18 ENCOUNTER — Other Ambulatory Visit: Payer: Medicare Other

## 2013-07-18 VITALS — BP 130/76 | HR 104 | Ht 69.0 in | Wt 231.0 lb

## 2013-07-18 DIAGNOSIS — J4541 Moderate persistent asthma with (acute) exacerbation: Secondary | ICD-10-CM

## 2013-07-18 DIAGNOSIS — R911 Solitary pulmonary nodule: Secondary | ICD-10-CM

## 2013-07-18 DIAGNOSIS — J45901 Unspecified asthma with (acute) exacerbation: Secondary | ICD-10-CM

## 2013-07-18 DIAGNOSIS — J449 Chronic obstructive pulmonary disease, unspecified: Secondary | ICD-10-CM

## 2013-07-18 NOTE — Patient Instructions (Signed)
Take the QVar one puff twice a day  Take Spiriva daily Take Symbicort 2 puffs twice a day Go see your cardiologist We will see you back in 2 months in [place]

## 2013-07-18 NOTE — Progress Notes (Signed)
Subjective:    Patient ID: Donald Juarez, male    DOB: [birth year], 68 y.o.   MRN: [id number]  Synopsis: Donald Juarez first saw the [facility] in 2014 for COPD after smoking 1 PPD for 30 years and quitting in 2010.  He has frequent exacerbations.    03/2013 Full PFT> Ratio 53% FEV1 1.89 (65% pred, 13% change), TLC 5.83 L (93% pred), DLCO 16.6 (69% pred)  HPI  Chief Complaint  Patient presents with  . Follow-up    Review CT chest. Starts pulm rehab on Monday.  Pt states he did not sleep well last night, SOB this morning.  has been great this week up until today.    07/18/2013 [name] > Donald Juarez was feeling well up until this morning when he started to note increased dyspnea and wheezing this morning.  He has not had increased sputum production, fevers or chills.  He has a hard time recalling the names of his inhalers but with some difficulty he tells me that he has been taking them appropriately.    Past Medical History  Diagnosis Date  . COPD (chronic obstructive pulmonary disease)   . Heart disease   . Hx of colon cancer, stage I   . Hypertension   . Irregular heartbeat      Review of Systems  Constitutional: Positive for fatigue. Negative for fever and chills.  HENT: Positive for postnasal drip, rhinorrhea and sinus pressure.   Respiratory: Positive for cough, shortness of breath and wheezing.   Cardiovascular: Negative for chest pain, palpitations and leg swelling.       Objective:   Physical Exam  Filed Vitals:   07/18/13 0959  BP: 130/76  Pulse: 104  Height: 5\' 9"  (1.753 m)  Weight: 231 lb (104.781 kg)  SpO2: 97%    RA  Gen: well appearing, no acute distress HEENT: NCAT, EOMi, OP clear PULM: good air movement, no wheeze CV: RRR, no mgr, no JVD AB: BS+, soft, nontender Ext: warm, no edema, no clubbing, no cyanosis Derm: no rash or skin breakdown       Assessment & Plan:   COPD with asthma It's not clear to me that he is taking his  medications appropriately.  He has COPD and Asthma. He is not clearly in an exacerbation today.  Plan: -add QVar trial for a small particle inhaler for asthma -exercise regularly -reviewed the importance of medication compliance -f/u 2 months   Solitary pulmonary nodule This nodule hasn't changed in 7 years, consistent exceedingly low risk for malignancy.  No f/u imaging needed.    Updated Medication List Outpatient Encounter Prescriptions as of 07/18/2013  Medication Sig  . albuterol (PROAIR HFA) 108 (90 BASE) MCG/ACT inhaler Inhale 2 puffs into the lungs every 6 (six) hours as needed for wheezing or shortness of breath.  [name] albuterol (PROVENTIL) (2.5 MG/3ML) 0.083% nebulizer solution Take 3 mLs (2.5 mg total) by nebulization every 6 (six) hours as needed for wheezing or shortness of breath.  [name] apixaban (ELIQUIS) 5 MG TABS tablet Take 5 mg by mouth 2 (two) times daily.  [name] aspirin 81 MG tablet Take 81 mg by mouth daily.  [name] atorvastatin (LIPITOR) 40 MG tablet Take 40 mg by mouth daily.  . budesonide-formoterol (SYMBICORT) 160-4.5 MCG/ACT inhaler Inhale 2 puffs into the lungs 2 (two) times daily.  [name] diltiazem (TIAZAC) 300 MG 24 hr capsule Take 300 mg by mouth daily.  . fluticasone (FLONASE) 50 MCG/ACT nasal spray Place  2 sprays into both nostrils daily.  . isosorbide dinitrate (ISORDIL) 30 MG tablet Take 30 mg by mouth daily.  . metoprolol (LOPRESSOR) 100 MG tablet Take 100 mg by mouth 2 (two) times daily.  [name] Spacer/Aero-Holding Chambers (AEROCHAMBER MV) inhaler Use as instructed  . tiotropium (SPIRIVA) 18 MCG inhalation capsule Place 18 mcg into inhaler and inhale daily.  . [DISCONTINUED] arformoterol (BROVANA) 15 MCG/2ML NEBU Take 2 mLs (15 mcg total) by nebulization 2 (two) times daily.  . [DISCONTINUED] budesonide (PULMICORT) 0.5 MG/2ML nebulizer solution Take 2 mLs (0.5 mg total) by nebulization every 12 (twelve) hours.  . [DISCONTINUED] predniSONE (DELTASONE) 10 MG tablet Take 1  tablet (10 mg total) by mouth daily with breakfast.  . [DISCONTINUED] traMADol (ULTRAM) 50 MG tablet Take 1 tablet (50 mg total) by mouth every 6 (six) hours as needed (cough).

## 2013-07-19 LAB — IGE: IGE (IMMUNOGLOBULIN E), SERUM: 24.2 [IU]/mL (ref 0.0–180.0)

## 2013-07-28 ENCOUNTER — Encounter: Payer: Self-pay | Admitting: Pulmonary Disease

## 2013-07-28 NOTE — Assessment & Plan Note (Signed)
This nodule hasn't changed in 7 years, consistent exceedingly low risk for malignancy.  No f/u imaging needed.

## 2013-07-28 NOTE — Assessment & Plan Note (Addendum)
It's not clear to me that he is taking his medications appropriately.  He has COPD and Asthma. He is not clearly in an exacerbation today.  Plan: -add QVar trial for a small particle inhaler for asthma -exercise regularly -reviewed the importance of medication compliance -f/u 2 months

## 2013-07-29 ENCOUNTER — Encounter: Payer: Self-pay | Admitting: *Deleted

## 2013-07-29 NOTE — Progress Notes (Signed)
Quick Note:  Spoke with pt, he is aware of results. Nothing further needed. ______

## 2013-08-05 ENCOUNTER — Ambulatory Visit (INDEPENDENT_AMBULATORY_CARE_PROVIDER_SITE_OTHER): Payer: Medicare Other | Admitting: Internal Medicine

## 2013-08-05 ENCOUNTER — Encounter: Payer: Self-pay | Admitting: Internal Medicine

## 2013-08-05 VITALS — BP 125/89 | HR 102 | Ht 69.0 in | Wt 227.5 lb

## 2013-08-05 DIAGNOSIS — I6523 Occlusion and stenosis of bilateral carotid arteries: Secondary | ICD-10-CM

## 2013-08-05 DIAGNOSIS — I658 Occlusion and stenosis of other precerebral arteries: Secondary | ICD-10-CM

## 2013-08-05 DIAGNOSIS — I6529 Occlusion and stenosis of unspecified carotid artery: Secondary | ICD-10-CM

## 2013-08-05 DIAGNOSIS — I509 Heart failure, unspecified: Secondary | ICD-10-CM

## 2013-08-05 DIAGNOSIS — R0602 Shortness of breath: Secondary | ICD-10-CM

## 2013-08-05 NOTE — Progress Notes (Signed)
ELECTROPHYSIOLOGY CONSULT NOTE  Patient ID: Donald Juarez, MRN: [id number], DOB/AGE: 1945/02/15 68 y.o. Admit date: (Not on file) Date of Consult: 08/05/2013  Primary Physician: Sofie Hartigan, MD Primary Cardiologist: BK/DC  Chief Complaint: pacemaker follouwp  HPI Donald Juarez is a 68 y.o. male  Seen in referral from D. Kayleen Memos ANP/BK  He has a history of a pacemaker implanted a few years ago for reasons that are not clear to him. I presume it was related to bradycardia in the context of  What has subsequently been demonstrated to be atrial flutter that has been persistent for about 7 years.  There has been some modest exercise intolerance.  Left ventricular function assessments have been variable. At the time of catheterization 2011 at which time he underwent stenting of the circumflex ejection fraction by echo was recorded at 35-40%. Nuclear scan 12/14 apparently showed EF of 55%. Ventriculography was described catheterization 1/15 but I could not find the report to read catheterization at that time demonstrated stable coronary disease  This was undertaken for chest pain and shortness of breath. He has some nocturnal dyspnea. He has not had peripheral edema.  He has noted that his heart rate has been in the 100 range for some time. There has been ongoing up titration of his AV nodal blocking agents   He has known peripheral vascular disease with carotid stenosis in 2003 reported at 75-99%  He denies prior TIA. CHADS-VASc score is 3 (age, hypertension, vascular disease) He takes apixaban as an anticoagulant although he is not aware of why he takes it.       Past Medical History  Diagnosis Date  . COPD (chronic obstructive pulmonary disease)   . Heart disease   . Hx of colon cancer, stage I   . Hypertension   . Irregular heartbeat   . MI (myocardial infarction)   . Syncope and collapse   . Coronary artery disease   . Cancer   . Rectum cancer       Surgical  History:  Past Surgical History  Procedure Laterality Date  . Cardiac surgery  2011    Stents placed   . Cataract surgery  2012  . Colon surgery      cancer removal  . Knee arthroscopy Right 1980's  . Cardiac catheterization  07/2009    [facility]  . Cardiac catheterization  01/2013    [facility]  . Insert / replace / remove pacemaker       Home Meds: Prior to Admission medications   Medication Sig Start Date End Date Taking? Authorizing Provider  albuterol (PROAIR HFA) 108 (90 BASE) MCG/ACT inhaler Inhale 2 puffs into the lungs every 6 (six) hours as needed for wheezing or shortness of breath. 03/28/13   Donald Doom, MD  albuterol (PROVENTIL) (2.5 MG/3ML) 0.083% nebulizer solution Take 3 mLs (2.5 mg total) by nebulization every 6 (six) hours as needed for wheezing or shortness of breath. 05/01/13   Donald Doom, MD  apixaban (ELIQUIS) 5 MG TABS tablet Take 5 mg by mouth 2 (two) times daily.    Historical Provider, MD  aspirin 81 MG tablet Take 81 mg by mouth daily.    Historical Provider, MD  atorvastatin (LIPITOR) 40 MG tablet Take 40 mg by mouth daily.    Historical Provider, MD  budesonide-formoterol (SYMBICORT) 160-4.5 MCG/ACT inhaler Inhale 2 puffs into the lungs 2 (two) times daily.    Historical Provider, MD  diltiazem (TIAZAC) 300 MG 24  hr capsule Take 300 mg by mouth daily.    Historical Provider, MD  fluticasone (FLONASE) 50 MCG/ACT nasal spray Place 2 sprays into both nostrils daily. 05/12/13   Historical Provider, MD  isosorbide dinitrate (ISORDIL) 30 MG tablet Take 30 mg by mouth daily.    Historical Provider, MD  metoprolol (LOPRESSOR) 100 MG tablet Take 100 mg by mouth 2 (two) times daily.    Historical Provider, MD  Spacer/Aero-Holding Chambers (AEROCHAMBER MV) inhaler Use as instructed 05/28/13   Donald Doom, MD  tiotropium (SPIRIVA) 18 MCG inhalation capsule Place 18 mcg into inhaler and inhale daily.    Historical Provider, MD    Allergies: Not on File  History    Social History  . Marital Status: Married    Spouse Name: N/A    Number of Children: N/A  . Years of Education: N/A   Occupational History  . Not on file.   Social History Main Topics  . Smoking status: Former Smoker -- 1.00 packs/day for 30 years    Types: Cigarettes    Quit date: 07/26/2008  . Smokeless tobacco: Never Used  . Alcohol Use: No  . Drug Use: No  . Sexual Activity: Not on file   Other Topics Concern  . Not on file   Social History Narrative  . No narrative on file     Family History  Problem Relation Age of Onset  . Cancer Mother     'male cancer"  . Hypertension Mother   . Hyperlipidemia Mother   . Heart disease Father   . Heart attack Father   . Hypertension Father   . Hyperlipidemia Father      ROS:  Please see the history of present illness.     All other systems reviewed and negative.    Physical Exa   Blood pressure 125/89, pulse 102, height 5\' 9"  (1.753 m), weight 227 lb 8 oz (103.193 kg). General: Well developed, well nourished male in no acute distress. Head: Normocephalic, atraumatic, sclera non-icteric, no xanthomas, nares are without discharge. EENT: normal Lymph Nodes:  none Back: without scoliosis/kyphosis , no CVA tendersness Neck: Negative for carotid bruits. JVD 7-8 cm Lungs: Clear bilaterally to auscultation without wheezes, rales, or rhonchi. Breathing is unlabored. Heart: rapid and irregular rate with 2/6 systolic murmur , rubs, or gallops appreciated. Abdomen: Soft, non-tender, non-distended with normoactive bowel sounds. No hepatomegaly. No rebound/guarding. No obvious abdominal masses. Msk:  Strength and tone appear norma tr edema.  Distal pedal pulses are 2+ and equal bilaterally. Skin: Warm and Dry Neuro: Alert and oriented X 3. CN III-XII intact Grossly normal sensory and motor function . Psych:  Responds to questions appropriately with a normal affect.      Labs: Cardiac Enzymes No results found for this  basename: CKTOTAL, CKMB, TROPONINI,  in the last 72 hours CBC Lab Results  Component Value Date   WBC 10.4 06/24/2013   HGB 16.0 06/24/2013   HCT 46.4 06/24/2013   MCV 94.4 06/24/2013   PLT 271.0 06/24/2013   PROTIME: No results found for this basename: LABPROT, INR,  in the last 72 hours Chemistry No results found for this basename: NA, K, CL, CO2, BUN, CREATININE, CALCIUM, LABALBU, PROT, BILITOT, ALKPHOS, ALT, AST, GLUCOSE,  in the last 168 hours Lipids No results found for this basename: CHOL,  HDL,  LDLCALC,  TRIG   BNP No results found for this basename: probnp   Miscellaneous No results found for this basename: DDIMER  Radiology/Studies:  No results found.  EKG: Atfrial flutter  With variable conduction but mostly 2-1 ECG may/15 and 12/14 will demonstrate atrial flutter ECGs 2007-2013 All demonstrate atrial flutter>> there is no evidence of atrial fibrillation     Assessment and Plan:   Atrial flutter-rapid ventricular response  Pacemaker-[employer]? Indication  Ischemic heart disease with prior stenting  Left ventricular dysfunction by echo 2011 current status unknown  Carotid stenosis-75-99% 2003 current status unknown  Dyspnea on exertion-heart failure  The patient has persistent atrial arrhythmias. Currently we have only seen atrial flutter and this is consistent with the long-term histogram data from his pacemaker. This being the case I suggested that we anticipate undertaking catheter ablation of his atrial flutter substrate.  We need to understand left ventricular function. We'll undertake an echo to look at this. This will also inform our understanding of his left atrial size.  In the event that he has atrial fibrillation it will likely be easier to control his atrial flutter. Currently 75% of his heartbeat or faster than 110 beats per minute  He also significant carotid stenosis. We will repeat a carotid Doppler.  ACE inhibitors and diuretics will be  deferred currently as he is only mildly volume overloaded and we don't know his left ventricular function  Donald Juarez

## 2013-08-05 NOTE — Patient Instructions (Signed)
Your physician has requested that you have a carotid duplex. This test is an ultrasound of the carotid arteries in your neck. It looks at blood flow through these arteries that supply the brain with blood. Allow one hour for this exam. There are no restrictions or special instructions.  Your physician has requested that you have an echocardiogram. Echocardiography is a painless test that uses sound waves to create images of your heart. It provides your doctor with information about the size and shape of your heart and how well your heart's chambers and valves are working. This procedure takes approximately one hour. There are no restrictions for this procedure.  Roderic Palau will call you

## 2013-08-19 ENCOUNTER — Other Ambulatory Visit (INDEPENDENT_AMBULATORY_CARE_PROVIDER_SITE_OTHER): Payer: Medicare Other

## 2013-08-19 ENCOUNTER — Encounter (INDEPENDENT_AMBULATORY_CARE_PROVIDER_SITE_OTHER): Payer: Medicare Other

## 2013-08-19 ENCOUNTER — Other Ambulatory Visit: Payer: Self-pay

## 2013-08-19 DIAGNOSIS — I509 Heart failure, unspecified: Secondary | ICD-10-CM

## 2013-08-19 DIAGNOSIS — I6523 Occlusion and stenosis of bilateral carotid arteries: Secondary | ICD-10-CM

## 2013-08-19 DIAGNOSIS — R0602 Shortness of breath: Secondary | ICD-10-CM

## 2013-08-19 DIAGNOSIS — I6529 Occlusion and stenosis of unspecified carotid artery: Secondary | ICD-10-CM

## 2013-08-28 ENCOUNTER — Telehealth: Payer: Self-pay

## 2013-08-28 ENCOUNTER — Other Ambulatory Visit: Payer: Self-pay

## 2013-08-28 MED ORDER — TIOTROPIUM BROMIDE MONOHYDRATE 18 MCG IN CAPS: 18.0000 ug | ORAL_CAPSULE | Freq: Every day | RESPIRATORY_TRACT | Status: DC

## 2013-08-28 NOTE — Telephone Encounter (Signed)
Spoke with pt's wife Blanch Media to let her know that I still have their original paperwork from the [facility] for pt's Spiriva assistance program.  I let her know that I would be in [place] with VM on 8/12 and would leave them at the front office for them, as they do not want me to mail such sensitive paperwork.  I will hold this and place it up front on 8/12.  Nothing further needed.

## 2013-08-28 NOTE — Telephone Encounter (Signed)
Patient assistance for [employer] has been faxed for Spiriva.  Nothing needed at this time.

## 2013-09-05 ENCOUNTER — Telehealth: Payer: Self-pay | Admitting: *Deleted

## 2013-09-05 DIAGNOSIS — I6523 Occlusion and stenosis of bilateral carotid arteries: Secondary | ICD-10-CM

## 2013-09-05 NOTE — Telephone Encounter (Signed)
Called patient to schedule flutter ablation  Patient stated he was very confused and can not understand me over the phone He stated he would not schedule procedure until he had a conversation in a person with a doctor about it  I asked him if he can see Donald Bayley NP as Dr. Caryl Comes has no availability until September  He agreed  Patient scheduled to see Donald Juarez 08/15 Patient scheduled for ablation on 10/10/13 at 10 am  I scheduled this now because it is the next available slot with general anesthesia

## 2013-09-05 NOTE — Telephone Encounter (Signed)
Message copied by Tracie Harrier on Fri Sep 05, 2013  9:24 AM ------      Message from: [name]      Created: Wed Sep 03, 2013  8:34 AM       That should be scheduled  I would tell him that his LA size is prettty good as is his LV function      Please schedule with Sherri on a Friday with general anesthesia      Thanks steve      ----- Message -----         From: Tracie Harrier, RN         Sent: 08/25/2013  10:16 AM           To: [name], MD            Catheter ablation has not been scheduled       When would you like to do ablation?       ASAP?       ------

## 2013-09-11 ENCOUNTER — Ambulatory Visit (INDEPENDENT_AMBULATORY_CARE_PROVIDER_SITE_OTHER): Payer: Medicare Other | Admitting: Nurse Practitioner

## 2013-09-11 ENCOUNTER — Encounter: Payer: Self-pay | Admitting: Nurse Practitioner

## 2013-09-11 ENCOUNTER — Encounter: Payer: Self-pay | Admitting: *Deleted

## 2013-09-11 VITALS — BP 122/82 | HR 116 | Ht 69.0 in | Wt 227.0 lb

## 2013-09-11 DIAGNOSIS — I658 Occlusion and stenosis of other precerebral arteries: Secondary | ICD-10-CM

## 2013-09-11 DIAGNOSIS — I739 Peripheral vascular disease, unspecified: Secondary | ICD-10-CM

## 2013-09-11 DIAGNOSIS — I6523 Occlusion and stenosis of bilateral carotid arteries: Secondary | ICD-10-CM

## 2013-09-11 DIAGNOSIS — I4892 Unspecified atrial flutter: Secondary | ICD-10-CM

## 2013-09-11 DIAGNOSIS — I2510 Atherosclerotic heart disease of native coronary artery without angina pectoris: Secondary | ICD-10-CM | POA: Insufficient documentation

## 2013-09-11 DIAGNOSIS — Z01812 Encounter for preprocedural laboratory examination: Secondary | ICD-10-CM

## 2013-09-11 DIAGNOSIS — I10 Essential (primary) hypertension: Secondary | ICD-10-CM

## 2013-09-11 DIAGNOSIS — I499 Cardiac arrhythmia, unspecified: Secondary | ICD-10-CM

## 2013-09-11 DIAGNOSIS — I6529 Occlusion and stenosis of unspecified carotid artery: Secondary | ICD-10-CM | POA: Insufficient documentation

## 2013-09-11 NOTE — Progress Notes (Signed)
Patient Name: Donald Juarez Date of Encounter: 09/11/2013  Primary Care Provider:  [facility], MD Primary Cardiologist:  Olin Pia, MD   Patient Profile  68 y/o male with h/o CAD, carotid stenosis, COPD, and persistent atrial flutter who presents today to discuss test results and plans for aflutter ablation.  Problem List   Past Medical History  Diagnosis Date  . COPD (chronic obstructive pulmonary disease)   . Carotid stenosis     a. 07/2013 Carotid U/S: 100% RICA (pt prev reported h/o 91%), LICA 47-82%, bilat ECA 50%, normal vertebrals and subclavians bilat-->F/U needed in 01/2014.  [name] Rectal cancer     a. Status post colostomy  . Hypertension   . Paroxysmal atrial flutter     a. CHA2DS2VASc = 3 - on eliquis;  b. 07/2013 Echo: EF 50-55%, mildly dil LA.  [name] Syncope and collapse   . Coronary artery disease     a. 06/1996 & 03/2005 Cath: non-obs dzs;  b. 07/2009 NSTEMI/Cath: LCX 99->PCI/DES extending into OM1;  c. 01/2013 Cath: LM nl, LAD 30p, 76m, LCX 30ost, 20p ISR, 69m, OM1 patent stent, OM2 30/small, RCA 20/40p, 91m, RPL 75, EF 55%-->Med Rx.  . SVT (supraventricular tachycardia)     a. Dating back to the mid '90's with reported prior DCCV.  [name] Hyperlipidemia   . Cataract, right     a. 10/2010 s/p cataract surgery   . Shingles     a. 09/2012.  . Pulmonary nodule, right     a. 0.7cm RLL - stable by CT 06/2013.  . Bilateral claudication of lower limb   . SSS (sick sinus syndrome)     a. 12/2009 S/P MDT PPM ([name])   Past Surgical History  Procedure Laterality Date  . Cardiac surgery  2011    Stents placed   . Cataract surgery  2012  . Colon surgery      cancer removal  . Knee arthroscopy Right 1980's  . Cardiac catheterization  07/2009    [facility]  . Cardiac catheterization  01/2013    [facility]  . Insert / replace / remove pacemaker      Allergies  No Known Allergies  HPI  69 y/o male with the above problem list. He was previously followed in [place] [facility] with a  h/o SVT dating back to the 90's, paroxysmal atrial flutter, and CAD with prior stenting of the LCX/OM1 with patent stents on cath 01/2013.  He was recently seen by Dr. Caryl Comes in July 2/2 persistent atrial flutter with associated fatigue and reduced exercise tolerance.  He was felt to be a good candidate for aflutter RFCA.  Echo was performed, which showed nl LV fxn and mild LAE.  Pt also reported a h/o right sided carotid dzs and had not had it evaluated in some time.  A carotid U/S was performed and revealed an occluded RICA with 95-62% LICA stenosis and 13% bilat ECA stenosis.    He presents today to discuss test results and plans for ablation.  He remains relatively fatigued but does try to exercise 3 days/wk.  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  He has some degree of chronic DOE and this has not changed recently.  He denies chest pain, palpitations, pnd, orthopnea, n, v, dizziness, syncope, early satiety, unilateral wkns, or aphasia.  He does report bilat LE/calf claudication when walking up inclines.  This has been present for some time.  Home Medications  Prior to Admission medications  Medication Sig Start Date End Date Taking? Authorizing Provider  albuterol (PROAIR HFA) 108 (90 BASE) MCG/ACT inhaler Inhale 2 puffs into the lungs every 6 (six) hours as needed for wheezing or shortness of breath. 03/28/13  Yes Juanito Doom, MD  albuterol (PROVENTIL) (2.5 MG/3ML) 0.083% nebulizer solution Take 3 mLs (2.5 mg total) by nebulization every 6 (six) hours as needed for wheezing or shortness of breath. 05/01/13  Yes Juanito Doom, MD  apixaban (ELIQUIS) 5 MG TABS tablet Take 5 mg by mouth 2 (two) times daily.   Yes Historical Provider, MD  aspirin 81 MG tablet Take 81 mg by mouth daily.   Yes Historical Provider, MD  atorvastatin (LIPITOR) 40 MG tablet Take 40 mg by mouth daily.   Yes Historical Provider, MD  budesonide-formoterol (SYMBICORT) 160-4.5 MCG/ACT inhaler Inhale 2 puffs into  the lungs 2 (two) times daily.   Yes Historical Provider, MD  Cholecalciferol (VITAMIN D) 2000 UNITS tablet Take 2,000 Units by mouth daily.   Yes Historical Provider, MD  diltiazem (TIAZAC) 360 MG 24 hr capsule Take 360 mg by mouth daily.   Yes Historical Provider, MD  fluticasone (FLONASE) 50 MCG/ACT nasal spray Place 2 sprays into both nostrils daily. 05/12/13  Yes Historical Provider, MD  isosorbide dinitrate (ISORDIL) 30 MG tablet Take 30 mg by mouth daily.   Yes Historical Provider, MD  metoprolol (LOPRESSOR) 100 MG tablet Take 100 mg by mouth 2 (two) times daily.   Yes Historical Provider, MD  Spacer/Aero-Holding Chambers (AEROCHAMBER MV) inhaler Use as instructed 05/28/13  Yes Juanito Doom, MD  tiotropium (SPIRIVA) 18 MCG inhalation capsule Place 1 capsule (18 mcg total) into inhaler and inhale daily. 08/28/13  Yes Juanito Doom, MD    Family History  Family History  Problem Relation Age of Onset  . Cancer Mother     'male cancer"  . Hypertension Mother   . Hyperlipidemia Mother   . Heart disease Father   . Heart attack Father   . Hypertension Father   . Hyperlipidemia Father     Social History  History   Social History  . Marital Status: Married    Spouse Name: N/A    Number of Children: N/A  . Years of Education: N/A   Occupational History  . Not on file.   Social History Main Topics  . Smoking status: Former Smoker -- 1.00 packs/day for 30 years    Types: Cigarettes    Quit date: 07/26/2008  . Smokeless tobacco: Never Used  . Alcohol Use: No  . Drug Use: No  . Sexual Activity: Not on file   Other Topics Concern  . Not on file   Social History Narrative  . No narrative on file     Review of Systems General:  No chills, fever, night sweats or weight changes.  Cardiovascular:  No chest pain, +++ dyspnea on exertion, +++ bilat claudication.  No edema, orthopnea, palpitations, paroxysmal nocturnal dyspnea. Dermatological: No rash,  lesions/masses Respiratory: No cough, dyspnea Urologic: No hematuria, dysuria Abdominal:   No nausea, vomiting, diarrhea, bright red blood per rectum, melena, or hematemesis Neurologic:  No visual changes, wkns, changes in mental status. All other systems reviewed and are otherwise negative except as noted above.  Physical Exam  Blood pressure 122/82, pulse 116, height 5\' 9"  (1.753 m), weight 227 lb (102.967 kg).  General: Pleasant, NAD Psych: Normal affect. Neuro: Alert and oriented X 3. Moves all extremities spontaneously. HEENT: Normal  Neck: Supple  without bruits or JVD. Lungs:  Resp regular and unlabored, diminished breath sounds bilat. Heart: RRR, tachy, distant.  No murmurs. Abdomen: Soft, non-tender, non-distended, BS + x 4.  Extremities: Bilat forearms with extensive bruising.  No clubbing, cyanosis or edema. PT 1+ bilat - difficult to palpate DPs.  Accessory Clinical Findings  ECG - aflutter, 116, no acute st/t changes.  Assessment & Plan  1.  Persistent atrial flutter with RVR:  Pt presents with persistent atrial flutter to discuss planned RFCA, currently scheduled for 10/10/2013.  We discussed the indications, risks, and benefits of the procedure and all questions were answered.  He is very interested in proceeding as he does have baseline fatigue and relatively poor exercise tolerance, which he attributes to both his rhythm as well as high dose dilt and BB therapy.  He remains on eliquis and he is tolerating this, though he bruises easily.  2.  CAD:  No chest pain/angina.  Cath in January showed patent LCX/OM1 stents.  Cont asa, bb, nitrate, statin.  3.  Bilateral Carotid Arterial Disease:  Carotid U/S last month showed 100% occlusion of the RICA with 41-93% LICA stenosis.  He is asymptomatic and denies unilateral wkns, aphasia.  Neuro exam is nl.  Discussed with Dr. Fletcher Anon.  Cont ASA/Statin.  He will need f/u U/S in months (January 2016).  4.  Bilateral LE Claudication:   Pt reports a long h/o bilateral calf cramping and pain with ambulation, esp with inclines.  I will check ABI's. Cont asa/statin.  5.  HTN:  Stable.  Cont current meds.  6.  HL:  Cont statin.  7.  COPD:  Quit smoking ~ 5 yrs ago. Followed closely by pulmonology.  He is hopeful that RFCA for flutter will allow him to come off of BB.  8.  Dispo:  Ablation scheduled for 9/11 with Dr. Caryl Comes.  Will arrange f/u for 4-6 wks after that with Dr. Caryl Comes in the office.  Needs f/u carotid u/s in January 2016.   Murray Hodgkins, NP 09/11/2013, 3:01 PM

## 2013-09-11 NOTE — Patient Instructions (Addendum)
Your physician has recommended that you have an ablation. Catheter ablation is a medical procedure used to treat some cardiac arrhythmias (irregular heartbeats). During catheter ablation, a long, thin, flexible tube is put into a blood vessel in your groin (upper thigh), or neck. This tube is called an ablation catheter. It is then guided to your heart through the blood vessel. Radio frequency waves destroy small areas of heart tissue where abnormal heartbeats may cause an arrhythmia to start. Please see the instruction sheet given to you today.  Your ablation is scheduled for 10/10/13.  Please arrive at the [facility] entrance of [facility] at [place] has requested that you have an ankle brachial index (ABI). During this test an ultrasound and blood pressure cuff are used to evaluate the arteries that supply the arms and legs with blood. Allow thirty minutes for this exam. There are no restrictions or special instructions.  Please followup w/ Dr. Caryl Comes 4-6 weeks after your procedure (the end of October).

## 2013-09-12 LAB — BASIC METABOLIC PANEL
BUN / CREAT RATIO: 21 (ref 10–22)
BUN: 24 mg/dL (ref 8–27)
CHLORIDE: 103 mmol/L (ref 97–108)
CO2: 19 mmol/L (ref 18–29)
Calcium: 10 mg/dL (ref 8.6–10.2)
Creatinine, Ser: 1.12 mg/dL (ref 0.76–1.27)
GFR calc non Af Amer: 67 mL/min/{1.73_m2} (ref >59)
GFR, EST AFRICAN AMERICAN: 77 mL/min/{1.73_m2} (ref >59)
Glucose: 104 mg/dL — ABNORMAL HIGH (ref 65–99)
Potassium: 5.9 mmol/L — ABNORMAL HIGH (ref 3.5–5.2)
Sodium: 145 mmol/L — ABNORMAL HIGH (ref 134–144)

## 2013-09-12 LAB — CBC WITH DIFFERENTIAL
Basophils Absolute: 0 10*3/uL (ref 0.0–0.2)
Basos: 0 %
Eos: 2 %
Eosinophils Absolute: 0.2 10*3/uL (ref 0.0–0.4)
HEMATOCRIT: 46.3 % (ref 37.5–51.0)
Hemoglobin: 16.5 g/dL (ref 12.6–17.7)
Immature Grans (Abs): 0 10*3/uL (ref 0.0–0.1)
Immature Granulocytes: 0 %
LYMPHS ABS: 0.9 10*3/uL (ref 0.7–3.1)
Lymphs: 10 %
MCH: 33.6 pg — AB (ref 26.6–33.0)
MCHC: 35.6 g/dL (ref 31.5–35.7)
MCV: 94 fL (ref 79–97)
MONOCYTES: 10 %
Monocytes Absolute: 0.9 10*3/uL (ref 0.1–0.9)
NEUTROS ABS: 7.3 10*3/uL — AB (ref 1.4–7.0)
Neutrophils Relative %: 78 %
Platelets: 229 10*3/uL (ref 150–379)
RBC: 4.91 x10E6/uL (ref 4.14–5.80)
RDW: 13.7 % (ref 12.3–15.4)
WBC: 9.2 10*3/uL (ref 3.4–10.8)

## 2013-09-12 LAB — PROTIME-INR
INR: 1.1 (ref 0.8–1.2)
PROTHROMBIN TIME: 11.2 s (ref 9.1–12.0)

## 2013-09-15 ENCOUNTER — Other Ambulatory Visit: Payer: Self-pay

## 2013-09-15 DIAGNOSIS — E875 Hyperkalemia: Secondary | ICD-10-CM

## 2013-09-16 ENCOUNTER — Other Ambulatory Visit (HOSPITAL_COMMUNITY): Payer: Self-pay | Admitting: *Deleted

## 2013-09-16 ENCOUNTER — Ambulatory Visit (INDEPENDENT_AMBULATORY_CARE_PROVIDER_SITE_OTHER): Payer: Medicare Other

## 2013-09-16 DIAGNOSIS — I70219 Atherosclerosis of native arteries of extremities with intermittent claudication, unspecified extremity: Secondary | ICD-10-CM

## 2013-09-16 DIAGNOSIS — E875 Hyperkalemia: Secondary | ICD-10-CM

## 2013-09-17 ENCOUNTER — Other Ambulatory Visit: Payer: Self-pay

## 2013-09-17 ENCOUNTER — Other Ambulatory Visit: Payer: Self-pay | Admitting: Cardiovascular Disease

## 2013-09-17 DIAGNOSIS — E875 Hyperkalemia: Secondary | ICD-10-CM

## 2013-09-17 LAB — BASIC METABOLIC PANEL
ANION GAP: 5 — AB (ref 7–16)
BUN/Creatinine Ratio: 15 (ref 10–22)
BUN: 21 mg/dL (ref 8–27)
BUN: 22 mg/dL — AB (ref 7–18)
CALCIUM: 9.5 mg/dL (ref 8.6–10.2)
CALCIUM: 9.2 mg/dL (ref 8.5–10.1)
CO2: 23 mmol/L (ref 18–29)
Chloride: 103 mmol/L (ref 97–108)
Chloride: 105 mmol/L (ref 98–107)
Co2: 31 mmol/L (ref 21–32)
Creatinine, Ser: 1.36 mg/dL — ABNORMAL HIGH (ref 0.76–1.27)
Creatinine: 1.13 mg/dL (ref 0.60–1.30)
EGFR (Non-African Amer.): >60
GFR calc Af Amer: 61 mL/min/{1.73_m2} (ref >59)
GFR, EST NON AFRICAN AMERICAN: 53 mL/min/{1.73_m2} — AB (ref >59)
GLUCOSE: 100 mg/dL — AB (ref 65–99)
GLUCOSE: 127 mg/dL — AB (ref 65–99)
OSMOLALITY: 286 (ref 275–301)
Potassium: 6.4 mmol/L — ABNORMAL HIGH (ref 3.5–5.2)
Potassium: 5.2 mmol/L — ABNORMAL HIGH (ref 3.5–5.1)
SODIUM: 143 mmol/L (ref 134–144)
SODIUM: 141 mmol/L (ref 136–145)

## 2013-09-18 ENCOUNTER — Encounter: Payer: Self-pay | Admitting: Cardiovascular Disease

## 2013-09-23 ENCOUNTER — Encounter: Payer: Self-pay | Admitting: Pulmonary Disease

## 2013-09-23 ENCOUNTER — Ambulatory Visit (INDEPENDENT_AMBULATORY_CARE_PROVIDER_SITE_OTHER): Payer: Medicare Other | Admitting: Pulmonary Disease

## 2013-09-23 ENCOUNTER — Other Ambulatory Visit: Payer: Self-pay

## 2013-09-23 VITALS — BP 124/68 | HR 73 | Ht 69.0 in | Wt 227.0 lb

## 2013-09-23 DIAGNOSIS — I4892 Unspecified atrial flutter: Secondary | ICD-10-CM

## 2013-09-23 DIAGNOSIS — J449 Chronic obstructive pulmonary disease, unspecified: Secondary | ICD-10-CM

## 2013-09-23 MED ORDER — ARFORMOTEROL TARTRATE 15 MCG/2ML IN NEBU: 15.0000 ug | INHALATION_SOLUTION | Freq: Two times a day (BID) | RESPIRATORY_TRACT | Status: DC

## 2013-09-23 MED ORDER — PREDNISONE 10 MG PO TABS: ORAL_TABLET | ORAL | Status: DC

## 2013-09-23 MED ORDER — ALBUTEROL SULFATE (2.5 MG/3ML) 0.083% IN NEBU: 2.5000 mg | INHALATION_SOLUTION | Freq: Four times a day (QID) | RESPIRATORY_TRACT | Status: DC | PRN

## 2013-09-23 MED ORDER — BUDESONIDE 0.25 MG/2ML IN SUSP: 0.2500 mg | Freq: Two times a day (BID) | RESPIRATORY_TRACT | Status: DC

## 2013-09-23 MED ORDER — DOXYCYCLINE HYCLATE 100 MG PO CAPS: 100.0000 mg | ORAL_CAPSULE | Freq: Two times a day (BID) | ORAL | Status: DC

## 2013-09-23 NOTE — Telephone Encounter (Signed)
Message copied by Tracie Harrier on Tue Sep 23, 2013  8:31 AM ------      Message from: [name]      Created: Fri Aug 22, 2013  5:02 PM       Please Inform Patient that carotid study   is abnormal and will require further eval   Please ask Dr MA to look at the study and suggest appropriate followup             Thanks       ------

## 2013-09-23 NOTE — Assessment & Plan Note (Addendum)
Today on exam he is was rate controlled in the 70's  Plan for likely ablation from Dr. Caryl Comes in the future, suspect he will feel better in sinus rhythm

## 2013-09-23 NOTE — Patient Instructions (Signed)
Take the prednisone and the doxycycline for five days We will send prescriptions for Brovana and Pulmicort to [place]; when you get these they should be able to replace the Symbicort and QVar  We will see you back in 3 months or sooner if needed

## 2013-09-23 NOTE — Assessment & Plan Note (Signed)
Donald Juarez is having another mild exacerbation as he is wheezing again.  He thinks that it has something to do when the cannister of Symbicort is running low.  I doubt this is the case, but I do think he would have better drug delivery with nebulized bronchodilators and steroids.  Plan: -prednisone 5 days -doxycycline 5 days -change symbicort and QVar to pulmicort/brovana from a DME company (NOT pharmacy as we tried before) -f/u 2 months

## 2013-09-23 NOTE — Progress Notes (Signed)
Subjective:    Patient ID: Donald Juarez, male    DOB: [birth year], 68 y.o.   MRN: [id number]  Synopsis: Donald Juarez first saw the [facility] clinic in 2014 for COPD after smoking 1 PPD for 30 years and quitting in 2010.  He has frequent exacerbations.    03/2013 Full PFT> Ratio 53% FEV1 1.89 (65% pred, 13% change), TLC 5.83 L (93% pred), DLCO 16.6 (69% pred)  HPI  Chief Complaint  Patient presents with  . Follow-up    Pt c/o increased SOB with exertion X1 week, prod cough with clear mucus, sinus congestion, PND.     09/23/2013 [name]> Donald Juarez thinks that he felt better after taking the QVar with the Symbicort. He said that he felt better for the first time in a lont time.  However he started feeling worse again about a week ago, mostly low energy.  He has been eercising with silver sneaker and his oxygen level has been running 94-95% on room air with exercise.  His blood pressure has been a little higher lately.  In the last week he has been using his rescue inhaler more and has been more short of breath.  He continues to use the Spiriva, symbicort, and QVar.  He feels chest congestion that he can't get out.      Past Medical History  Diagnosis Date  . COPD (chronic obstructive pulmonary disease)   . Carotid stenosis     a. 07/2013 Carotid U/S: 100% RICA (pt prev reported h/o 25%), LICA 36-64%, bilat ECA 50%, normal vertebrals and subclavians bilat-->F/U needed in 01/2014.  [name] Rectal cancer     a. Status post colostomy  . Hypertension   . Paroxysmal atrial flutter     a. CHA2DS2VASc = 3 - on eliquis;  b. 07/2013 Echo: EF 50-55%, mildly dil LA.  [name] Syncope and collapse   . Coronary artery disease     a. 06/1996 & 03/2005 Cath: non-obs dzs;  b. 07/2009 NSTEMI/Cath: LCX 99->PCI/DES extending into OM1;  c. 01/2013 Cath: LM nl, LAD 30p, 71m, LCX 30ost, 20p ISR, 57m, OM1 patent stent, OM2 30/small, RCA 20/40p, 58m, RPL 75, EF 55%-->Med Rx.  . SVT (supraventricular tachycardia)     a.  Dating back to the mid '90's with reported prior DCCV.  [name] Hyperlipidemia   . Cataract, right     a. 10/2010 s/p cataract surgery   . Shingles     a. 09/2012.  . Pulmonary nodule, right     a. 0.7cm RLL - stable by CT 06/2013.  . Bilateral claudication of lower limb   . SSS (sick sinus syndrome)     a. 12/2009 S/P MDT PPM ([name])     Review of Systems  Constitutional: Positive for fatigue. Negative for fever and chills.  HENT: Negative for postnasal drip, rhinorrhea and sinus pressure.   Respiratory: Positive for cough, shortness of breath and wheezing.   Cardiovascular: Negative for chest pain, palpitations and leg swelling.       Objective:   Physical Exam  Filed Vitals:   09/23/13 1521  BP: 124/68  Pulse: 73  Height: 5\' 9"  (1.753 m)  Weight: 227 lb (102.967 kg)  SpO2: 95%    RA  Gen: well appearing, no acute distress HEENT: NCAT, EOMi, OP clear PULM: wheezing today, mildly reduced air movement CV: RRR, no mgr, no JVD AB: BS+, soft, nontender Ext: warm, no edema, no clubbing, no cyanosis Derm: no rash or skin breakdown  Assessment & Plan:   COPD with asthma Donald Juarez is having another mild exacerbation as he is wheezing again.  He thinks that it has something to do when the cannister of Symbicort is running low.  I doubt this is the case, but I do think he would have better drug delivery with nebulized bronchodilators and steroids.  Plan: -prednisone 5 days -doxycycline 5 days -change symbicort and QVar to pulmicort/brovana from a DME company (NOT pharmacy as we tried before) -f/u 2 months  Paroxysmal atrial flutter Today on exam he is was rate controlled in the 70's  Plan for likely ablation from Dr. Caryl Comes in the future, suspect he will feel better in sinus rhythm    Updated Medication List Outpatient Encounter Prescriptions as of 09/23/2013  Medication Sig  . albuterol (PROAIR HFA) 108 (90 BASE) MCG/ACT inhaler Inhale 2 puffs into the lungs  every 6 (six) hours as needed for wheezing or shortness of breath.  [name] albuterol (PROVENTIL) (2.5 MG/3ML) 0.083% nebulizer solution Take 3 mLs (2.5 mg total) by nebulization every 6 (six) hours as needed for wheezing or shortness of breath.  [name] apixaban (ELIQUIS) 5 MG TABS tablet Take 5 mg by mouth 2 (two) times daily.  [name] aspirin 81 MG tablet Take 81 mg by mouth daily.  [name] atorvastatin (LIPITOR) 40 MG tablet Take 40 mg by mouth daily.  . budesonide-formoterol (SYMBICORT) 160-4.5 MCG/ACT inhaler Inhale 2 puffs into the lungs 2 (two) times daily.  . Cholecalciferol (VITAMIN D) 2000 UNITS tablet Take 2,000 Units by mouth daily.  [name] diltiazem (TIAZAC) 360 MG 24 hr capsule Take 360 mg by mouth daily.  . fluticasone (FLONASE) 50 MCG/ACT nasal spray Place 2 sprays into both nostrils daily.  . isosorbide dinitrate (ISORDIL) 30 MG tablet Take 30 mg by mouth daily.  . metoprolol (LOPRESSOR) 100 MG tablet Take 100 mg by mouth 2 (two) times daily.  [name] Spacer/Aero-Holding Chambers (AEROCHAMBER MV) inhaler Use as instructed  . tiotropium (SPIRIVA) 18 MCG inhalation capsule Place 1 capsule (18 mcg total) into inhaler and inhale daily.

## 2013-10-07 ENCOUNTER — Telehealth: Payer: Self-pay | Admitting: *Deleted

## 2013-10-07 ENCOUNTER — Encounter (HOSPITAL_COMMUNITY): Payer: Self-pay | Admitting: Pharmacy Technician

## 2013-10-07 ENCOUNTER — Other Ambulatory Visit: Payer: Medicare Other

## 2013-10-07 DIAGNOSIS — Z01812 Encounter for preprocedural laboratory examination: Secondary | ICD-10-CM

## 2013-10-07 DIAGNOSIS — I4892 Unspecified atrial flutter: Secondary | ICD-10-CM

## 2013-10-07 NOTE — Telephone Encounter (Signed)
Scheduled patient for labs and ekg for upcomming apt

## 2013-10-08 ENCOUNTER — Other Ambulatory Visit: Payer: Self-pay

## 2013-10-08 ENCOUNTER — Other Ambulatory Visit: Payer: Medicare Other

## 2013-10-08 ENCOUNTER — Other Ambulatory Visit: Payer: Self-pay | Admitting: Internal Medicine

## 2013-10-08 DIAGNOSIS — Z01812 Encounter for preprocedural laboratory examination: Secondary | ICD-10-CM

## 2013-10-08 DIAGNOSIS — I4892 Unspecified atrial flutter: Secondary | ICD-10-CM

## 2013-10-08 LAB — CBC WITH DIFFERENTIAL/PLATELET
BASOS ABS: 0.1 10*3/uL (ref 0.0–0.1)
BASOS PCT: 1 %
EOS ABS: 0.1 10*3/uL (ref 0.0–0.7)
EOS PCT: 1 %
HCT: 51.9 % (ref 40.0–52.0)
HGB: 16.8 g/dL (ref 13.0–18.0)
Lymphocyte #: 0.9 10*3/uL — ABNORMAL LOW (ref 1.0–3.6)
Lymphocyte %: 7.7 %
MCH: 31.9 pg (ref 26.0–34.0)
MCHC: 32.4 g/dL (ref 32.0–36.0)
MCV: 99 fL (ref 80–100)
Monocyte #: 0.8 x10 3/mm (ref 0.2–1.0)
Monocyte %: 7.1 %
NEUTROS PCT: 83.2 %
Neutrophil #: 9.9 10*3/uL — ABNORMAL HIGH (ref 1.4–6.5)
Platelet: 228 10*3/uL (ref 150–440)
RBC: 5.27 10*6/uL (ref 4.40–5.90)
RDW: 13.4 % (ref 11.5–14.5)
WBC: 11.9 10*3/uL — AB (ref 3.8–10.6)

## 2013-10-08 LAB — BASIC METABOLIC PANEL
Anion Gap: 5 — ABNORMAL LOW (ref 7–16)
BUN: 24 mg/dL — AB (ref 7–18)
CREATININE: 1.1 mg/dL (ref 0.60–1.30)
Calcium, Total: 9.2 mg/dL (ref 8.5–10.1)
Chloride: 106 mmol/L (ref 98–107)
Co2: 28 mmol/L (ref 21–32)
EGFR (African American): >60
EGFR (Non-African Amer.): >60
GLUCOSE: 116 mg/dL — AB (ref 65–99)
Osmolality: 283 (ref 275–301)
Potassium: 4.8 mmol/L (ref 3.5–5.1)
SODIUM: 139 mmol/L (ref 136–145)

## 2013-10-08 LAB — PROTIME-INR
INR: 1.1
Prothrombin Time: 14.3 secs (ref 11.5–14.7)

## 2013-10-09 ENCOUNTER — Other Ambulatory Visit: Payer: Self-pay

## 2013-10-09 MED ORDER — ALBUTEROL SULFATE (2.5 MG/3ML) 0.083% IN NEBU: 2.5000 mg | INHALATION_SOLUTION | Freq: Four times a day (QID) | RESPIRATORY_TRACT | Status: DC | PRN

## 2013-10-10 ENCOUNTER — Encounter (HOSPITAL_COMMUNITY): Payer: Self-pay | Admitting: Anesthesiology

## 2013-10-10 ENCOUNTER — Encounter (HOSPITAL_COMMUNITY)
Admission: RE | Disposition: A | Discharge status: Home / Self Care | Payer: Self-pay | Source: Ambulatory Visit | Attending: Internal Medicine

## 2013-10-10 ENCOUNTER — Ambulatory Visit (HOSPITAL_COMMUNITY)
Admission: RE | Admit: 2013-10-10 | Discharge: 2013-10-10 | Disposition: A | Discharge status: Home / Self Care | Payer: Medicare Other | Source: Ambulatory Visit | Attending: Internal Medicine | Admitting: Internal Medicine

## 2013-10-10 DIAGNOSIS — I10 Essential (primary) hypertension: Secondary | ICD-10-CM | POA: Diagnosis not present

## 2013-10-10 DIAGNOSIS — I2510 Atherosclerotic heart disease of native coronary artery without angina pectoris: Secondary | ICD-10-CM | POA: Insufficient documentation

## 2013-10-10 DIAGNOSIS — J449 Chronic obstructive pulmonary disease, unspecified: Secondary | ICD-10-CM | POA: Insufficient documentation

## 2013-10-10 DIAGNOSIS — Z79899 Other long term (current) drug therapy: Secondary | ICD-10-CM | POA: Diagnosis not present

## 2013-10-10 DIAGNOSIS — I495 Sick sinus syndrome: Secondary | ICD-10-CM | POA: Diagnosis not present

## 2013-10-10 DIAGNOSIS — J4489 Other specified chronic obstructive pulmonary disease: Secondary | ICD-10-CM | POA: Insufficient documentation

## 2013-10-10 DIAGNOSIS — I6529 Occlusion and stenosis of unspecified carotid artery: Secondary | ICD-10-CM | POA: Insufficient documentation

## 2013-10-10 DIAGNOSIS — Z933 Colostomy status: Secondary | ICD-10-CM | POA: Diagnosis not present

## 2013-10-10 DIAGNOSIS — Z950 Presence of cardiac pacemaker: Secondary | ICD-10-CM | POA: Diagnosis not present

## 2013-10-10 DIAGNOSIS — Z85048 Personal history of other malignant neoplasm of rectum, rectosigmoid junction, and anus: Secondary | ICD-10-CM | POA: Insufficient documentation

## 2013-10-10 DIAGNOSIS — Z7982 Long term (current) use of aspirin: Secondary | ICD-10-CM | POA: Insufficient documentation

## 2013-10-10 DIAGNOSIS — I4892 Unspecified atrial flutter: Secondary | ICD-10-CM | POA: Diagnosis present

## 2013-10-10 DIAGNOSIS — I739 Peripheral vascular disease, unspecified: Secondary | ICD-10-CM | POA: Diagnosis not present

## 2013-10-10 DIAGNOSIS — E785 Hyperlipidemia, unspecified: Secondary | ICD-10-CM | POA: Diagnosis not present

## 2013-10-10 DIAGNOSIS — Z87891 Personal history of nicotine dependence: Secondary | ICD-10-CM | POA: Diagnosis not present

## 2013-10-10 DIAGNOSIS — Z5309 Procedure and treatment not carried out because of other contraindication: Secondary | ICD-10-CM | POA: Insufficient documentation

## 2013-10-10 DIAGNOSIS — Z7901 Long term (current) use of anticoagulants: Secondary | ICD-10-CM | POA: Diagnosis not present

## 2013-10-10 SURGERY — ATRIAL FLUTTER ABLATION
Anesthesia: Monitor Anesthesia Care

## 2013-10-10 MED ORDER — SODIUM CHLORIDE 0.9 % IV SOLN
INTRAVENOUS | Status: DC
  Administered 2013-10-10: 08:00:00 via INTRAVENOUS

## 2013-10-10 NOTE — H&P (View-Only) (Signed)
Patient Name: Donald Juarez Date of Encounter: 09/11/2013  Primary Care Provider:  [facility], MD Primary Cardiologist:  Olin Pia, MD   Patient Profile  68 y/o male with h/o CAD, carotid stenosis, COPD, and persistent atrial flutter who presents today to discuss test results and plans for aflutter ablation.  Problem List   Past Medical History  Diagnosis Date  . COPD (chronic obstructive pulmonary disease)   . Carotid stenosis     a. 07/2013 Carotid U/S: 100% RICA (pt prev reported h/o 42%), LICA 70-62%, bilat ECA 50%, normal vertebrals and subclavians bilat-->F/U needed in 01/2014.  [name] Rectal cancer     a. Status post colostomy  . Hypertension   . Paroxysmal atrial flutter     a. CHA2DS2VASc = 3 - on eliquis;  b. 07/2013 Echo: EF 50-55%, mildly dil LA.  [name] Syncope and collapse   . Coronary artery disease     a. 06/1996 & 03/2005 Cath: non-obs dzs;  b. 07/2009 NSTEMI/Cath: LCX 99->PCI/DES extending into OM1;  c. 01/2013 Cath: LM nl, LAD 30p, 52m, LCX 30ost, 20p ISR, 53m, OM1 patent stent, OM2 30/small, RCA 20/40p, 97m, RPL 75, EF 55%-->Med Rx.  . SVT (supraventricular tachycardia)     a. Dating back to the mid '90's with reported prior DCCV.  [name] Hyperlipidemia   . Cataract, right     a. 10/2010 s/p cataract surgery   . Shingles     a. 09/2012.  . Pulmonary nodule, right     a. 0.7cm RLL - stable by CT 06/2013.  . Bilateral claudication of lower limb   . SSS (sick sinus syndrome)     a. 12/2009 S/P MDT PPM ([name])   Past Surgical History  Procedure Laterality Date  . Cardiac surgery  2011    Stents placed   . Cataract surgery  2012  . Colon surgery      cancer removal  . Knee arthroscopy Right 1980's  . Cardiac catheterization  07/2009    [facility]  . Cardiac catheterization  01/2013    [facility]  . Insert / replace / remove pacemaker      Allergies  No Known Allergies  HPI  68 y/o male with the above problem list. He was previously followed in [place] [facility] with a  h/o SVT dating back to the 90's, paroxysmal atrial flutter, and CAD with prior stenting of the LCX/OM1 with patent stents on cath 01/2013.  He was recently seen by Dr. Caryl Comes in July 2/2 persistent atrial flutter with associated fatigue and reduced exercise tolerance.  He was felt to be a good candidate for aflutter RFCA.  Echo was performed, which showed nl LV fxn and mild LAE.  Pt also reported a h/o right sided carotid dzs and had not had it evaluated in some time.  A carotid U/S was performed and revealed an occluded RICA with 37-62% LICA stenosis and 83% bilat ECA stenosis.    He presents today to discuss test results and plans for ablation.  He remains relatively fatigued but does try to exercise 3 days/wk.  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  He has some degree of chronic DOE and this has not changed recently.  He denies chest pain, palpitations, pnd, orthopnea, n, v, dizziness, syncope, early satiety, unilateral wkns, or aphasia.  He does report bilat LE/calf claudication when walking up inclines.  This has been present for some time.  Home Medications  Prior to Admission medications  Medication Sig Start Date End Date Taking? Authorizing Provider  albuterol (PROAIR HFA) 108 (90 BASE) MCG/ACT inhaler Inhale 2 puffs into the lungs every 6 (six) hours as needed for wheezing or shortness of breath. 03/28/13  Yes Juanito Doom, MD  albuterol (PROVENTIL) (2.5 MG/3ML) 0.083% nebulizer solution Take 3 mLs (2.5 mg total) by nebulization every 6 (six) hours as needed for wheezing or shortness of breath. 05/01/13  Yes Juanito Doom, MD  apixaban (ELIQUIS) 5 MG TABS tablet Take 5 mg by mouth 2 (two) times daily.   Yes Historical Provider, MD  aspirin 81 MG tablet Take 81 mg by mouth daily.   Yes Historical Provider, MD  atorvastatin (LIPITOR) 40 MG tablet Take 40 mg by mouth daily.   Yes Historical Provider, MD  budesonide-formoterol (SYMBICORT) 160-4.5 MCG/ACT inhaler Inhale 2 puffs into  the lungs 2 (two) times daily.   Yes Historical Provider, MD  Cholecalciferol (VITAMIN D) 2000 UNITS tablet Take 2,000 Units by mouth daily.   Yes Historical Provider, MD  diltiazem (TIAZAC) 360 MG 24 hr capsule Take 360 mg by mouth daily.   Yes Historical Provider, MD  fluticasone (FLONASE) 50 MCG/ACT nasal spray Place 2 sprays into both nostrils daily. 05/12/13  Yes Historical Provider, MD  isosorbide dinitrate (ISORDIL) 30 MG tablet Take 30 mg by mouth daily.   Yes Historical Provider, MD  metoprolol (LOPRESSOR) 100 MG tablet Take 100 mg by mouth 2 (two) times daily.   Yes Historical Provider, MD  Spacer/Aero-Holding Chambers (AEROCHAMBER MV) inhaler Use as instructed 05/28/13  Yes Juanito Doom, MD  tiotropium (SPIRIVA) 18 MCG inhalation capsule Place 1 capsule (18 mcg total) into inhaler and inhale daily. 08/28/13  Yes Juanito Doom, MD    Family History  Family History  Problem Relation Age of Onset  . Cancer Mother     'male cancer"  . Hypertension Mother   . Hyperlipidemia Mother   . Heart disease Father   . Heart attack Father   . Hypertension Father   . Hyperlipidemia Father     Social History  History   Social History  . Marital Status: Married    Spouse Name: N/A    Number of Children: N/A  . Years of Education: N/A   Occupational History  . Not on file.   Social History Main Topics  . Smoking status: Former Smoker -- 1.00 packs/day for 30 years    Types: Cigarettes    Quit date: 07/26/2008  . Smokeless tobacco: Never Used  . Alcohol Use: No  . Drug Use: No  . Sexual Activity: Not on file   Other Topics Concern  . Not on file   Social History Narrative  . No narrative on file     Review of Systems General:  No chills, fever, night sweats or weight changes.  Cardiovascular:  No chest pain, +++ dyspnea on exertion, +++ bilat claudication.  No edema, orthopnea, palpitations, paroxysmal nocturnal dyspnea. Dermatological: No rash,  lesions/masses Respiratory: No cough, dyspnea Urologic: No hematuria, dysuria Abdominal:   No nausea, vomiting, diarrhea, bright red blood per rectum, melena, or hematemesis Neurologic:  No visual changes, wkns, changes in mental status. All other systems reviewed and are otherwise negative except as noted above.  Physical Exam  Blood pressure 122/82, pulse 116, height 5\' 9"  (1.753 m), weight 227 lb (102.967 kg).  General: Pleasant, NAD Psych: Normal affect. Neuro: Alert and oriented X 3. Moves all extremities spontaneously. HEENT: Normal  Neck: Supple  without bruits or JVD. Lungs:  Resp regular and unlabored, diminished breath sounds bilat. Heart: RRR, tachy, distant.  No murmurs. Abdomen: Soft, non-tender, non-distended, BS + x 4.  Extremities: Bilat forearms with extensive bruising.  No clubbing, cyanosis or edema. PT 1+ bilat - difficult to palpate DPs.  Accessory Clinical Findings  ECG - aflutter, 116, no acute st/t changes.  Assessment & Plan  1.  Persistent atrial flutter with RVR:  Pt presents with persistent atrial flutter to discuss planned RFCA, currently scheduled for 10/10/2013.  We discussed the indications, risks, and benefits of the procedure and all questions were answered.  He is very interested in proceeding as he does have baseline fatigue and relatively poor exercise tolerance, which he attributes to both his rhythm as well as high dose dilt and BB therapy.  He remains on eliquis and he is tolerating this, though he bruises easily.  2.  CAD:  No chest pain/angina.  Cath in January showed patent LCX/OM1 stents.  Cont asa, bb, nitrate, statin.  3.  Bilateral Carotid Arterial Disease:  Carotid U/S last month showed 100% occlusion of the RICA with 64-33% LICA stenosis.  He is asymptomatic and denies unilateral wkns, aphasia.  Neuro exam is nl.  Discussed with Dr. Fletcher Anon.  Cont ASA/Statin.  He will need f/u U/S in months (January 2016).  4.  Bilateral LE Claudication:   Pt reports a long h/o bilateral calf cramping and pain with ambulation, esp with inclines.  I will check ABI's. Cont asa/statin.  5.  HTN:  Stable.  Cont current meds.  6.  HL:  Cont statin.  7.  COPD:  Quit smoking ~ 5 yrs ago. Followed closely by pulmonology.  He is hopeful that RFCA for flutter will allow him to come off of BB.  8.  Dispo:  Ablation scheduled for 9/11 with Dr. Caryl Comes.  Will arrange f/u for 4-6 wks after that with Dr. Caryl Comes in the office.  Needs f/u carotid u/s in January 2016.   Murray Hodgkins, NP 09/11/2013, 3:01 PM

## 2013-10-10 NOTE — Anesthesia Preprocedure Evaluation (Deleted)
Anesthesia Evaluation  Patient identified by MRN, date of birth, ID band Patient awake    Reviewed: Allergy & Precautions, H&P , NPO status , Patient's Chart, lab work & pertinent test results, reviewed documented beta blocker date and time   Airway       Dental   Pulmonary asthma , COPD COPD inhaler, former smoker,          Cardiovascular hypertension, Pt. on home beta blockers and Pt. on medications + CAD, + Cardiac Stents and + Peripheral Vascular Disease + dysrhythmias Atrial Fibrillation and Supra Ventricular Tachycardia  EF 50-55%. Valves normal.   Neuro/Psych negative neurological ROS  negative psych ROS   GI/Hepatic negative GI ROS, Neg liver ROS,   Endo/Other  Morbid obesity  Renal/GU negative Renal ROS  negative genitourinary   Musculoskeletal negative musculoskeletal ROS (+)   Abdominal   Peds  Hematology negative hematology ROS (+)   Anesthesia Other Findings   Reproductive/Obstetrics negative OB ROS                          Anesthesia Physical Anesthesia Plan  ASA: III  Anesthesia Plan: General   Post-op Pain Management:    Induction: Intravenous  Airway Management Planned: Oral ETT  Additional Equipment: None  Intra-op Plan:   Post-operative Plan: Extubation in OR  Informed Consent: I have reviewed the patients History and Physical, chart, labs and discussed the procedure including the risks, benefits and alternatives for the proposed anesthesia with the patient or authorized representative who has indicated his/her understanding and acceptance.   Dental advisory given  Plan Discussed with: CRNA, Anesthesiologist and Surgeon  Anesthesia Plan Comments:         Anesthesia Quick Evaluation

## 2013-10-10 NOTE — Interval H&P Note (Signed)
History and Physical Interval Note:  10/10/2013 10:40 AM  Rosalita Levan  has presented today for surgery, with the diagnosis of flutter  The various methods of treatment have been discussed with the patient and family. After consideration of risks, benefits and other options for treatment, the patient has consented to  Procedure(s): ATRIAL FLUTTER ABLATION (N/A) as a surgical intervention .  The patient's history has been reviewed, patient examined, no change in status, stable for surgery.  I have reviewed the patient's chart and labs.  Questions were answered to the patient's satisfaction.     Virl Axe  Failed to take apixoban last pm and this am Will defer procedure and plan TEE DCCV next week and RFCA in 3-4 weeks

## 2013-10-10 NOTE — Interval H&P Note (Signed)
History and Physical Interval Note:  10/10/2013 10:31 AM  Donald Juarez  has presented today for surgery, with the diagnosis of flutter  The various methods of treatment have been discussed with the patient and family. After consideration of risks, benefits and other options for treatment, the patient has consented to  Procedure(s): ATRIAL FLUTTER ABLATION (N/A) as a surgical intervention .  The patient's history has been reviewed, patient examined, no change in status, stable for surgery.  I have reviewed the patient's chart and labs.  Questions were answered to the patient's satisfaction.     Donald Juarez  has seen Dr Lake Bells with interval problem for asthmatic bronchitis   Pt much improved occ chest pains associated with coughing Took apixoban

## 2013-10-13 ENCOUNTER — Telehealth: Payer: Self-pay | Admitting: Pulmonary Disease

## 2013-10-13 ENCOUNTER — Telehealth: Payer: Self-pay | Admitting: *Deleted

## 2013-10-13 NOTE — Telephone Encounter (Signed)
Called and spoke with pt and he is aware of his medications and the ones that he will need to take per BQ last OV note.  Nothing further is needed.

## 2013-10-13 NOTE — Telephone Encounter (Signed)
Spoke with patient to set up TEE/Cardioversion per verbal from Dr. Caryl Comes  Patient stated he did not want to have this procedure if he did not have to He wanted to know if he can just wait and have the ablation first   LVM for Dr. Caryl Comes

## 2013-10-14 ENCOUNTER — Telehealth: Payer: Self-pay | Admitting: Pulmonary Disease

## 2013-10-14 NOTE — Telephone Encounter (Signed)
OK to write albuterol 1 daily prn

## 2013-10-14 NOTE — Telephone Encounter (Signed)
Spoke with the pharmacist at [facility]  She states since the pt takes Brovana, her ins will only allow the albuterol neb to be taken 1 qd prn  Dr Lake Bells, please advise thanks!

## 2013-10-15 NOTE — Telephone Encounter (Signed)
LMTCB

## 2013-10-16 NOTE — Telephone Encounter (Signed)
LMTCB

## 2013-10-16 NOTE — Telephone Encounter (Signed)
Spoke with Anderson Malta and notified of recs per BQ  She verbalized understanding and nothing further needed

## 2013-10-17 NOTE — Telephone Encounter (Signed)
Per Dr. Caryl Comes have patient keep his appt to discuss cardioversion  LVM to inform patient

## 2013-10-17 NOTE — Telephone Encounter (Signed)
Patient returned call and stated he will keep his 9/22 appt to talk with Dr. Caryl Comes

## 2013-10-21 ENCOUNTER — Ambulatory Visit (INDEPENDENT_AMBULATORY_CARE_PROVIDER_SITE_OTHER): Payer: Medicare Other | Admitting: Internal Medicine

## 2013-10-21 ENCOUNTER — Other Ambulatory Visit: Payer: Self-pay | Admitting: *Deleted

## 2013-10-21 ENCOUNTER — Other Ambulatory Visit (HOSPITAL_COMMUNITY): Payer: Self-pay | Admitting: *Deleted

## 2013-10-21 ENCOUNTER — Encounter: Payer: Self-pay | Admitting: *Deleted

## 2013-10-21 ENCOUNTER — Encounter (INDEPENDENT_AMBULATORY_CARE_PROVIDER_SITE_OTHER): Payer: Medicare Other

## 2013-10-21 ENCOUNTER — Ambulatory Visit: Payer: Self-pay | Admitting: Internal Medicine

## 2013-10-21 ENCOUNTER — Encounter: Payer: Self-pay | Admitting: Internal Medicine

## 2013-10-21 ENCOUNTER — Other Ambulatory Visit: Payer: Self-pay

## 2013-10-21 ENCOUNTER — Ambulatory Visit: Payer: Medicare Other | Admitting: Internal Medicine

## 2013-10-21 VITALS — BP 130/92 | HR 91 | Ht 69.0 in | Wt 228.0 lb

## 2013-10-21 DIAGNOSIS — I4892 Unspecified atrial flutter: Secondary | ICD-10-CM

## 2013-10-21 DIAGNOSIS — I739 Peripheral vascular disease, unspecified: Secondary | ICD-10-CM

## 2013-10-21 DIAGNOSIS — I70219 Atherosclerosis of native arteries of extremities with intermittent claudication, unspecified extremity: Secondary | ICD-10-CM

## 2013-10-21 NOTE — Progress Notes (Signed)
Patient Care Team: Sofie Hartigan, MD as PCP - General (Family Medicine)   HPI  Donald Juarez is a 68 y.o. male Seen in followup for atrial flutter. He was scheduled for catheter ablation on 9/11. Unfortunately, he came in having failed to take his prior 24 hours of apixaban. The patient procedure was deferred.  He comes in today with complaints of abrupt onset of a brief vertiginous-like episode yesterday associated with left-sided headache. There is no significant residua.  He continues to complain of exercise intolerance. He recalls similar symptoms prior to his stenting in 2012.   CAD with prior stenting of the LCX/OM1 with patent stents on cath 01/2013.   Echo was performed, which showed nl LV fxn and mild LAE. Pt also reported a h/o right sided carotid dzs and had not had it evaluated in some time. A carotid U/S was performed and revealed an occluded RICA with 54-00% LICA stenosis and 86% bilat ECA stenosis.     Past Medical History  Diagnosis Date  . COPD (chronic obstructive pulmonary disease)   . Carotid stenosis     a. 07/2013 Carotid U/S: 100% RICA (pt prev reported h/o 76%), LICA 19-50%, bilat ECA 50%, normal vertebrals and subclavians bilat-->F/U needed in 01/2014.  [name] Rectal cancer     a. Status post colostomy  . Hypertension   . Paroxysmal atrial flutter     a. CHA2DS2VASc = 3 - on eliquis;  b. 07/2013 Echo: EF 50-55%, mildly dil LA.  [name] Syncope and collapse   . Coronary artery disease     a. 06/1996 & 03/2005 Cath: non-obs dzs;  b. 07/2009 NSTEMI/Cath: LCX 99->PCI/DES extending into OM1;  c. 01/2013 Cath: LM nl, LAD 30p, 53m, LCX 30ost, 20p ISR, 69m, OM1 patent stent, OM2 30/small, RCA 20/40p, 43m, RPL 75, EF 55%-->Med Rx.  . SVT (supraventricular tachycardia)     a. Dating back to the mid '90's with reported prior DCCV.  [name] Hyperlipidemia   . Cataract, right     a. 10/2010 s/p cataract surgery   . Shingles     a. 09/2012.  . Pulmonary nodule, right     a.  0.7cm RLL - stable by CT 06/2013.  . Bilateral claudication of lower limb   . SSS (sick sinus syndrome)     a. 12/2009 S/P MDT PPM ([name])    Past Surgical History  Procedure Laterality Date  . Cardiac surgery  2011    Stents placed   . Cataract surgery  2012  . Colon surgery      cancer removal  . Knee arthroscopy Right 1980's  . Cardiac catheterization  07/2009    [facility]  . Cardiac catheterization  01/2013    [facility]  . Insert / replace / remove pacemaker      Current Outpatient Prescriptions  Medication Sig Dispense Refill  . albuterol (PROVENTIL) (2.5 MG/3ML) 0.083% nebulizer solution Take 3 mLs (2.5 mg total) by nebulization every 6 (six) hours as needed for wheezing or shortness of breath. DX code 493.20  120 mL  3  . apixaban (ELIQUIS) 5 MG TABS tablet Take 5 mg by mouth 2 (two) times daily.      [name] arformoterol (BROVANA) 15 MCG/2ML NEBU Take 2 mLs (15 mcg total) by nebulization 2 (two) times daily. DX code 493.20      . aspirin 81 MG tablet Take 81 mg by mouth daily.      [name] atorvastatin (LIPITOR) 40 MG  tablet Take 40 mg by mouth daily.      . Cholecalciferol (VITAMIN D) 2000 UNITS tablet Take 2,000 Units by mouth daily.      [name] diltiazem (TIAZAC) 360 MG 24 hr capsule Take 360 mg by mouth daily.      . fluticasone (FLONASE) 50 MCG/ACT nasal spray Place 2 sprays into both nostrils daily.      . isosorbide dinitrate (ISORDIL) 30 MG tablet Take 30 mg by mouth daily.      . metoprolol (LOPRESSOR) 100 MG tablet Take 100 mg by mouth 2 (two) times daily.      [name] Spacer/Aero-Holding Chambers (AEROCHAMBER MV) inhaler Use as instructed  1 each  0  . tiotropium (SPIRIVA) 18 MCG inhalation capsule Place 1 capsule (18 mcg total) into inhaler and inhale daily.  30 capsule  11   No current facility-administered medications for this visit.    No Known Allergies  Review of Systems negative except from HPI and PMH  Physical Exam BP 130/92  Pulse 91  Ht 5\' 9"  (1.753 m)  Wt 228 lb (103.42  kg)  BMI 33.65 kg/m2 Well developed and well nourished in no acute distress HENT normal E scleral and icterus clear Neck Supple JVP flat; carotids brisk and full Clear to ausculation Fast and irregular  rate and rhythm, no murmurs gallops or rub Soft with active bowel sounds No clubbing cyanosis Trace Edema Alert and oriented, grossly normal motor and sensory function Skin Warm and Dry    Assessment and  Plan  Atrial flutter-persistent  Vertigo/headache  Hypertension  Coronary disease with prior stenting  HFpEF  Given his new onset headache in the context of anticoagulation CT scanning is indicated.  We will reschedule his catheter ablation 10-14 days for now and we have gone over the instructions reminding him that he should not interrupt his anticoagulation prior to the procedure.  Cath was unremarkable January 2015

## 2013-10-21 NOTE — Patient Instructions (Signed)
Non-Cardiac CT scanning, (CAT scanning), is a noninvasive, special x-ray that produces cross-sectional images of the body using x-rays and a computer. CT scans help physicians diagnose and treat medical conditions. For some CT exams, a contrast material is used to enhance visibility in the area of the body being studied. CT scans provide greater clarity and reveal more details than regular x-ray exams. Please report to the registration desk at [facility] for your head CT when you leave clinic today.   Your physician recommends that you return for lab work and EKG: 10/29/13 EKG  CBC  BMP INR   Your physician has recommended that you have an ablation. Catheter ablation is a medical procedure used to treat some cardiac arrhythmias (irregular heartbeats). During catheter ablation, a long, thin, flexible tube is put into a blood vessel in your groin (upper thigh), or neck. This tube is called an ablation catheter. It is then guided to your heart through the blood vessel. Radio frequency waves destroy small areas of heart tissue where abnormal heartbeats may cause an arrhythmia to start. Please see the instruction sheet given to you today. Please see attached letter for instructions.

## 2013-10-28 ENCOUNTER — Encounter (HOSPITAL_COMMUNITY): Payer: Self-pay | Admitting: Pharmacy Technician

## 2013-10-29 ENCOUNTER — Other Ambulatory Visit: Payer: Medicare Other

## 2013-10-29 ENCOUNTER — Telehealth: Payer: Self-pay | Admitting: *Deleted

## 2013-10-29 ENCOUNTER — Ambulatory Visit (INDEPENDENT_AMBULATORY_CARE_PROVIDER_SITE_OTHER): Payer: Medicare Other | Admitting: *Deleted

## 2013-10-29 VITALS — BP 140/82 | HR 100 | Ht 69.0 in | Wt 227.0 lb

## 2013-10-29 DIAGNOSIS — I4892 Unspecified atrial flutter: Secondary | ICD-10-CM

## 2013-10-29 DIAGNOSIS — I4891 Unspecified atrial fibrillation: Secondary | ICD-10-CM

## 2013-10-29 NOTE — Telephone Encounter (Signed)
Reviewed Ct results with patient (given verbally by Dr. Rockey Situ)  Reviewed ablation instructions  Patient verbalized understanding

## 2013-10-30 LAB — CBC WITH DIFFERENTIAL
Basophils Absolute: 0 10*3/uL (ref 0.0–0.2)
Basos: 0 %
Eos: 4 %
Eosinophils Absolute: 0.3 10*3/uL (ref 0.0–0.4)
HCT: 46.1 % (ref 37.5–51.0)
Hemoglobin: 15.9 g/dL (ref 12.6–17.7)
IMMATURE GRANS (ABS): 0 10*3/uL (ref 0.0–0.1)
IMMATURE GRANULOCYTES: 0 %
Lymphocytes Absolute: 1 10*3/uL (ref 0.7–3.1)
Lymphs: 17 %
MCH: 32.5 pg (ref 26.6–33.0)
MCHC: 34.5 g/dL (ref 31.5–35.7)
MCV: 94 fL (ref 79–97)
MONOCYTES: 11 %
Monocytes Absolute: 0.6 10*3/uL (ref 0.1–0.9)
NEUTROS PCT: 68 %
Neutrophils Absolute: 3.9 10*3/uL (ref 1.4–7.0)
PLATELETS: 219 10*3/uL (ref 150–379)
RBC: 4.89 x10E6/uL (ref 4.14–5.80)
RDW: 13.3 % (ref 12.3–15.4)
WBC: 5.8 10*3/uL (ref 3.4–10.8)

## 2013-10-30 LAB — BASIC METABOLIC PANEL
BUN/Creatinine Ratio: 18 (ref 10–22)
BUN: 16 mg/dL (ref 8–27)
CHLORIDE: 103 mmol/L (ref 97–108)
CO2: 24 mmol/L (ref 18–29)
Calcium: 9.3 mg/dL (ref 8.6–10.2)
Creatinine, Ser: 0.88 mg/dL (ref 0.76–1.27)
GFR calc Af Amer: 101 mL/min/{1.73_m2} (ref >59)
GFR calc non Af Amer: 88 mL/min/{1.73_m2} (ref >59)
Glucose: 107 mg/dL — ABNORMAL HIGH (ref 65–99)
Potassium: 5.6 mmol/L — ABNORMAL HIGH (ref 3.5–5.2)
SODIUM: 141 mmol/L (ref 134–144)

## 2013-10-31 ENCOUNTER — Ambulatory Visit (HOSPITAL_COMMUNITY): Payer: Medicare Other | Admitting: Anesthesiology

## 2013-10-31 ENCOUNTER — Ambulatory Visit (HOSPITAL_COMMUNITY)
Admission: RE | Admit: 2013-10-31 | Discharge: 2013-10-31 | Disposition: A | Discharge status: Home / Self Care | Payer: Medicare Other | Source: Ambulatory Visit | Attending: Internal Medicine | Admitting: Internal Medicine

## 2013-10-31 ENCOUNTER — Encounter (HOSPITAL_COMMUNITY): Payer: Medicare Other | Admitting: Anesthesiology

## 2013-10-31 ENCOUNTER — Encounter (HOSPITAL_COMMUNITY)
Admission: RE | Disposition: A | Discharge status: Home / Self Care | Payer: Self-pay | Source: Ambulatory Visit | Attending: Internal Medicine

## 2013-10-31 ENCOUNTER — Encounter (HOSPITAL_COMMUNITY): Payer: Self-pay | Admitting: Anesthesiology

## 2013-10-31 DIAGNOSIS — E785 Hyperlipidemia, unspecified: Secondary | ICD-10-CM | POA: Insufficient documentation

## 2013-10-31 DIAGNOSIS — Z6833 Body mass index (BMI) 33.0-33.9, adult: Secondary | ICD-10-CM | POA: Diagnosis not present

## 2013-10-31 DIAGNOSIS — I6529 Occlusion and stenosis of unspecified carotid artery: Secondary | ICD-10-CM | POA: Diagnosis not present

## 2013-10-31 DIAGNOSIS — I495 Sick sinus syndrome: Secondary | ICD-10-CM | POA: Diagnosis not present

## 2013-10-31 DIAGNOSIS — R911 Solitary pulmonary nodule: Secondary | ICD-10-CM | POA: Diagnosis not present

## 2013-10-31 DIAGNOSIS — I10 Essential (primary) hypertension: Secondary | ICD-10-CM | POA: Diagnosis not present

## 2013-10-31 DIAGNOSIS — Z7901 Long term (current) use of anticoagulants: Secondary | ICD-10-CM | POA: Insufficient documentation

## 2013-10-31 DIAGNOSIS — J432 Centrilobular emphysema: Secondary | ICD-10-CM | POA: Diagnosis present

## 2013-10-31 DIAGNOSIS — I739 Peripheral vascular disease, unspecified: Secondary | ICD-10-CM | POA: Insufficient documentation

## 2013-10-31 DIAGNOSIS — J45909 Unspecified asthma, uncomplicated: Secondary | ICD-10-CM | POA: Insufficient documentation

## 2013-10-31 DIAGNOSIS — I483 Typical atrial flutter: Secondary | ICD-10-CM | POA: Diagnosis present

## 2013-10-31 DIAGNOSIS — Z955 Presence of coronary angioplasty implant and graft: Secondary | ICD-10-CM | POA: Diagnosis not present

## 2013-10-31 DIAGNOSIS — I4892 Unspecified atrial flutter: Secondary | ICD-10-CM | POA: Diagnosis present

## 2013-10-31 DIAGNOSIS — Z87891 Personal history of nicotine dependence: Secondary | ICD-10-CM | POA: Diagnosis not present

## 2013-10-31 DIAGNOSIS — Z7951 Long term (current) use of inhaled steroids: Secondary | ICD-10-CM | POA: Insufficient documentation

## 2013-10-31 DIAGNOSIS — I471 Supraventricular tachycardia: Secondary | ICD-10-CM | POA: Insufficient documentation

## 2013-10-31 DIAGNOSIS — Z950 Presence of cardiac pacemaker: Secondary | ICD-10-CM | POA: Insufficient documentation

## 2013-10-31 DIAGNOSIS — I2510 Atherosclerotic heart disease of native coronary artery without angina pectoris: Secondary | ICD-10-CM | POA: Diagnosis present

## 2013-10-31 DIAGNOSIS — J449 Chronic obstructive pulmonary disease, unspecified: Secondary | ICD-10-CM | POA: Diagnosis not present

## 2013-10-31 DIAGNOSIS — Z7982 Long term (current) use of aspirin: Secondary | ICD-10-CM | POA: Diagnosis not present

## 2013-10-31 DIAGNOSIS — Z79899 Other long term (current) drug therapy: Secondary | ICD-10-CM | POA: Insufficient documentation

## 2013-10-31 HISTORY — DX: Hyperkalemia: E87.5

## 2013-10-31 HISTORY — DX: Cardiac arrhythmia, unspecified: I49.9

## 2013-10-31 HISTORY — PX: ATRIAL FLUTTER ABLATION: SHX5733

## 2013-10-31 HISTORY — PX: ABLATION: SHX5711

## 2013-10-31 LAB — BASIC METABOLIC PANEL
Anion gap: 12 (ref 5–15)
BUN: 19 mg/dL (ref 6–23)
CHLORIDE: 104 meq/L (ref 96–112)
CO2: 24 meq/L (ref 19–32)
Calcium: 8.7 mg/dL (ref 8.4–10.5)
Creatinine, Ser: 0.74 mg/dL (ref 0.50–1.35)
GFR calc Af Amer: >90 mL/min (ref >90)
GFR calc non Af Amer: >90 mL/min (ref >90)
GLUCOSE: 102 mg/dL — AB (ref 70–99)
POTASSIUM: 4.3 meq/L (ref 3.7–5.3)
Sodium: 140 mEq/L (ref 137–147)

## 2013-10-31 SURGERY — ATRIAL FLUTTER ABLATION
Anesthesia: General

## 2013-10-31 MED ORDER — ALBUTEROL SULFATE (2.5 MG/3ML) 0.083% IN NEBU
2.5000 mg | INHALATION_SOLUTION | Freq: Four times a day (QID) | RESPIRATORY_TRACT | Status: DC | PRN
Start: 2013-10-31 — End: 2013-11-01

## 2013-10-31 MED ORDER — PROPOFOL 10 MG/ML IV BOLUS
INTRAVENOUS | Status: DC | PRN
  Administered 2013-10-31: 30 mg via INTRAVENOUS
  Administered 2013-10-31: 130 mg via INTRAVENOUS
  Administered 2013-10-31: 40 mg via INTRAVENOUS

## 2013-10-31 MED ORDER — ONDANSETRON HCL 4 MG/2ML IJ SOLN
INTRAMUSCULAR | Status: DC | PRN
  Administered 2013-10-31: 4 mg via INTRAVENOUS

## 2013-10-31 MED ORDER — LACTATED RINGERS IV SOLN
INTRAVENOUS | Status: DC | PRN
  Administered 2013-10-31: 08:00:00 via INTRAVENOUS

## 2013-10-31 MED ORDER — LIDOCAINE HCL (CARDIAC) 20 MG/ML IV SOLN
INTRAVENOUS | Status: DC | PRN
  Administered 2013-10-31: 80 mg via INTRAVENOUS

## 2013-10-31 MED ORDER — METOPROLOL TARTRATE 50 MG PO TABS: 100.0000 mg | ORAL_TABLET | Freq: Two times a day (BID) | ORAL | Status: DC

## 2013-10-31 MED ORDER — ISOSORBIDE MONONITRATE ER 30 MG PO TB24: 30.0000 mg | ORAL_TABLET | Freq: Every day | ORAL | Status: DC

## 2013-10-31 MED ORDER — SODIUM CHLORIDE 0.9 % IV SOLN
INTRAVENOUS | Status: DC | PRN
  Administered 2013-10-31: 08:00:00 via INTRAVENOUS

## 2013-10-31 MED ORDER — APIXABAN 5 MG PO TABS: 5.0000 mg | ORAL_TABLET | Freq: Two times a day (BID) | ORAL | Status: DC

## 2013-10-31 MED ORDER — SODIUM CHLORIDE 0.9 % IV SOLN
250.0000 mL | INTRAVENOUS | Status: DC | PRN
Start: 2013-10-31 — End: 2013-11-01

## 2013-10-31 MED ORDER — TIOTROPIUM BROMIDE MONOHYDRATE 18 MCG IN CAPS
18.0000 ug | ORAL_CAPSULE | Freq: Every day | RESPIRATORY_TRACT | Status: DC
  Filled 2013-10-31: qty 5

## 2013-10-31 MED ORDER — ONDANSETRON HCL 4 MG/2ML IJ SOLN
4.0000 mg | Freq: Four times a day (QID) | INTRAMUSCULAR | Status: DC | PRN
Start: 2013-10-31 — End: 2013-11-01

## 2013-10-31 MED ORDER — ARTIFICIAL TEARS OP OINT
TOPICAL_OINTMENT | OPHTHALMIC | Status: DC | PRN
  Administered 2013-10-31: 1 via OPHTHALMIC

## 2013-10-31 MED ORDER — MIDAZOLAM HCL 5 MG/5ML IJ SOLN
INTRAMUSCULAR | Status: DC | PRN
  Administered 2013-10-31 (×2): 1 mg via INTRAVENOUS

## 2013-10-31 MED ORDER — METOPROLOL TARTRATE 50 MG PO TABS
50.0000 mg | ORAL_TABLET | Freq: Two times a day (BID) | ORAL | Status: DC
  Filled 2013-10-31: qty 1

## 2013-10-31 MED ORDER — SODIUM CHLORIDE 0.9 % IJ SOLN: 3.0000 mL | Freq: Two times a day (BID) | INTRAMUSCULAR | Status: DC

## 2013-10-31 MED ORDER — SODIUM CHLORIDE 0.9 % IJ SOLN: 3.0000 mL | INTRAMUSCULAR | Status: DC | PRN

## 2013-10-31 MED ORDER — FLUTICASONE PROPIONATE 50 MCG/ACT NA SUSP
1.0000 | Freq: Every day | NASAL | Status: DC
  Filled 2013-10-31: qty 16

## 2013-10-31 MED ORDER — AMLODIPINE BESYLATE 5 MG PO TABS: 5.0000 mg | ORAL_TABLET | Freq: Every day | ORAL | Status: DC

## 2013-10-31 MED ORDER — ATORVASTATIN CALCIUM 40 MG PO TABS
40.0000 mg | ORAL_TABLET | Freq: Every day | ORAL | Status: DC
  Filled 2013-10-31: qty 1

## 2013-10-31 MED ORDER — APIXABAN 5 MG PO TABS
5.0000 mg | ORAL_TABLET | Freq: Two times a day (BID) | ORAL | Status: DC
  Filled 2013-10-31: qty 1

## 2013-10-31 MED ORDER — BUPIVACAINE HCL (PF) 0.25 % IJ SOLN
INTRAMUSCULAR | Status: AC
  Filled 2013-10-31: qty 60

## 2013-10-31 MED ORDER — ACETAMINOPHEN 325 MG PO TABS
650.0000 mg | ORAL_TABLET | ORAL | Status: DC | PRN
  Filled 2013-10-31: qty 2

## 2013-10-31 MED ORDER — SUCCINYLCHOLINE CHLORIDE 20 MG/ML IJ SOLN
INTRAMUSCULAR | Status: DC | PRN
  Administered 2013-10-31: 60 mg via INTRAVENOUS

## 2013-10-31 MED ORDER — FENTANYL CITRATE 0.05 MG/ML IJ SOLN
INTRAMUSCULAR | Status: DC | PRN
  Administered 2013-10-31 (×4): 50 ug via INTRAVENOUS

## 2013-10-31 MED ORDER — BUDESONIDE 0.25 MG/2ML IN SUSP
0.2500 mg | Freq: Two times a day (BID) | RESPIRATORY_TRACT | Status: DC
  Filled 2013-10-31 (×2): qty 2

## 2013-10-31 MED ORDER — AMLODIPINE BESYLATE 5 MG PO TABS
5.0000 mg | ORAL_TABLET | Freq: Every day | ORAL | Status: DC
  Filled 2013-10-31: qty 1

## 2013-10-31 MED ORDER — EPHEDRINE SULFATE 50 MG/ML IJ SOLN
INTRAMUSCULAR | Status: DC | PRN
  Administered 2013-10-31: 15 mg via INTRAVENOUS
  Administered 2013-10-31 (×2): 10 mg via INTRAVENOUS

## 2013-10-31 NOTE — CV Procedure (Signed)
YOSSEF GILKISON [id number]  [id number]  Preop GB:MBOMQT flutter sinus node dysfunction with prev medtronic pacemaker Postop Dx same/ elevated right atrial pacing threshold  Procedure: E{PS RFCA pacemaker reprogramming  Cx: None   EBL: Minimal    Dictation number dont remember  Virl Axe, MD 10/31/2013 10:40 AM

## 2013-10-31 NOTE — Op Note (Signed)
NAME:  Donald Juarez, Donald Juarez NO.:  [id number]  MEDICAL RECORD NO.:  [id number]  LOCATION:  [facility]                         FACILITY:  [place]  PHYSICIAN:  [name], MD, FACCDATE OF BIRTH:  1945/08/06  DATE OF PROCEDURE:  10/31/2013 DATE OF DISCHARGE:                              OPERATIVE REPORT   PREOPERATIVE DIAGNOSES:  Atrial flutter, sinus node dysfunction, previously implanted pacemaker.  POSTOPERATIVE DIAGNOSES:  Atrial flutter, sinus node dysfunction, previously implanted pacemaker.  PROCEDURE:  Invasive electrophysiological study-comprehensive left atrial mapping and pacing coronaries, RF catheter ablation, pacemaker interrogation and reprogramming.  DESCRIPTION OF PROCEDURE:  Following obtaining informed consent, the patient was brought to the electrophysiology laboratory and placed on the fluoroscopic table in supine position.  After routine prep and drape, cardiac catheterization was performed with local anesthesia and general anesthesia.  Please see the anesthetic record.  Catheterization was performed and following the procedure, the catheters were removed.  Hemostasis was obtained.  The patient was transferred to the holding area for sheath removal.  CATHETERS:  A 5-French quadripolar catheter was inserted via the left femoral vein to the AV junction.  A 6-French octapolar catheter was inserted in the right femoral vein to the coronary sinus. A 7-French dual decapolar catheter was inserted via the left femoral vein to the tricuspid anulus. An 8-French 10 mm deflectable tip ablation catheter was inserted via the right femoral vein using an SAFL sheath to mapping sites in the posterior septal space.  Surface leads, 1, aVF and V1 were monitored continuously throughout the procedure, V1 was turned off because of noise.  Following insertion of the catheters, a stimulation protocol included incremental atrial pacing and incremental ventricular  pacing.  Single atrial extrastimuli at a paced cycle length of 600 milliseconds.  END-TIDAL RESULTS AND BASIC INTERVALS:  Initial and final rhythm: Atrial flutter; RR interval 752 milliseconds; AA interval 280 milliseconds; PR interval N/A; QRS duration 88 milliseconds; QTc interval N/M. AH interval N/A; HV interval:  40 milliseconds.  FINAL:  Rhythm:  Sinus; RR interval 1342 milliseconds; PR interval 210 milliseconds; P-wave duration 130 millisecond; QRS duration 98 millisecond; QT interval 452 milliseconds; AH interval 138 milliseconds; HV interval 55 milliseconds.  AV NODAL FUNCTION:  AV Wenckebach was 450 milliseconds. VA conduction was dissociated at 600 milliseconds.  The AV nodal effective refractory period at 600 milliseconds with 360 milliseconds without evidence of dual AV nodal physiology.  ACCESSORY PATHWAY FUNCTION:  No evidence of any accessory pathway was identified.  Ventricular response to programmed stimulation was normal as described above.  ARRHYTHMIAS INDUCED:  The patient presented to the lab in atrial flutter.  Electrogram mapping demonstrated counter-clockwise isthmus conduction.  Catheter ablation across the cavotricuspid isthmus terminated atrial flutter and resulted in bidirectional isthmus conduction block.  A total of 6 minutes and 58 seconds of RF energy was applied in 5 applications across the cavotricuspid isthmus.  Following ablation, the A1 A2 interval was 170 milliseconds.  FLUOROSCOPY TIME:  A total of 6.2 minutes of fluoroscopy time was utilized.  IMPRESSION: 1. Sinus bradycardia. 2. Abnormal atrial function with atrial flutter-sustained with     successful catheter ablation of the cavotricuspid isthmus with  bidirectional isthmus conduction block. 3. Normal AV nodal function. 4. Normal His-Purkinje system function. 5. No accessory pathway. 6. Normal ventricular response to programmed stimulation. 7. Normal device function with  an elevated right atrial pacing     threshold at 2.5 V at 0.76 milliseconds.  Summary of conclusion, the results of electrophysiological testing confirmed cavotricuspid isthmus dependent atrial flutter.  Catheter ablation successfully interrupted the ablation at the cavotricuspid isthmus and resulted in bidirectional isthmus conduction block.  Interrogation of the atrial lead following the ablation procedure demonstrated a high atrial threshold.  The device was programmed at 4 V at 0.7 milliseconds.  There was intrinsic conduction.     [name], MD, [facility]     SCK/MEDQ  D:  10/31/2013  T:  10/31/2013  Job:  [id number]

## 2013-10-31 NOTE — H&P (View-Only) (Signed)
Patient Care Team: Sofie Hartigan, MD as PCP - General (Family Medicine)   HPI  Donald Juarez is a 68 y.o. male Seen in followup for atrial flutter. He was scheduled for catheter ablation on 9/11. Unfortunately, he came in having failed to take his prior 24 hours of apixaban. The patient procedure was deferred.  He comes in today with complaints of abrupt onset of a brief vertiginous-like episode yesterday associated with left-sided headache. There is no significant residua.  He continues to complain of exercise intolerance. He recalls similar symptoms prior to his stenting in 2012.   CAD with prior stenting of the LCX/OM1 with patent stents on cath 01/2013.   Echo was performed, which showed nl LV fxn and mild LAE. Pt also reported a h/o right sided carotid dzs and had not had it evaluated in some time. A carotid U/S was performed and revealed an occluded RICA with 29-79% LICA stenosis and 89% bilat ECA stenosis.     Past Medical History  Diagnosis Date  . COPD (chronic obstructive pulmonary disease)   . Carotid stenosis     a. 07/2013 Carotid U/S: 100% RICA (pt prev reported h/o 21%), LICA 19-41%, bilat ECA 50%, normal vertebrals and subclavians bilat-->F/U needed in 01/2014.  [name] Rectal cancer     a. Status post colostomy  . Hypertension   . Paroxysmal atrial flutter     a. CHA2DS2VASc = 3 - on eliquis;  b. 07/2013 Echo: EF 50-55%, mildly dil LA.  [name] Syncope and collapse   . Coronary artery disease     a. 06/1996 & 03/2005 Cath: non-obs dzs;  b. 07/2009 NSTEMI/Cath: LCX 99->PCI/DES extending into OM1;  c. 01/2013 Cath: LM nl, LAD 30p, 34m, LCX 30ost, 20p ISR, 41m, OM1 patent stent, OM2 30/small, RCA 20/40p, 12m, RPL 75, EF 55%-->Med Rx.  . SVT (supraventricular tachycardia)     a. Dating back to the mid '90's with reported prior DCCV.  [name] Hyperlipidemia   . Cataract, right     a. 10/2010 s/p cataract surgery   . Shingles     a. 09/2012.  . Pulmonary nodule, right     a.  0.7cm RLL - stable by CT 06/2013.  . Bilateral claudication of lower limb   . SSS (sick sinus syndrome)     a. 12/2009 S/P MDT PPM ([name])    Past Surgical History  Procedure Laterality Date  . Cardiac surgery  2011    Stents placed   . Cataract surgery  2012  . Colon surgery      cancer removal  . Knee arthroscopy Right 1980's  . Cardiac catheterization  07/2009    [facility]  . Cardiac catheterization  01/2013    [facility]  . Insert / replace / remove pacemaker      Current Outpatient Prescriptions  Medication Sig Dispense Refill  . albuterol (PROVENTIL) (2.5 MG/3ML) 0.083% nebulizer solution Take 3 mLs (2.5 mg total) by nebulization every 6 (six) hours as needed for wheezing or shortness of breath. DX code 493.20  120 mL  3  . apixaban (ELIQUIS) 5 MG TABS tablet Take 5 mg by mouth 2 (two) times daily.      [name] arformoterol (BROVANA) 15 MCG/2ML NEBU Take 2 mLs (15 mcg total) by nebulization 2 (two) times daily. DX code 493.20      . aspirin 81 MG tablet Take 81 mg by mouth daily.      [name] atorvastatin (LIPITOR) 40 MG  tablet Take 40 mg by mouth daily.      . Cholecalciferol (VITAMIN D) 2000 UNITS tablet Take 2,000 Units by mouth daily.      [name] diltiazem (TIAZAC) 360 MG 24 hr capsule Take 360 mg by mouth daily.      . fluticasone (FLONASE) 50 MCG/ACT nasal spray Place 2 sprays into both nostrils daily.      . isosorbide dinitrate (ISORDIL) 30 MG tablet Take 30 mg by mouth daily.      . metoprolol (LOPRESSOR) 100 MG tablet Take 100 mg by mouth 2 (two) times daily.      [name] Spacer/Aero-Holding Chambers (AEROCHAMBER MV) inhaler Use as instructed  1 each  0  . tiotropium (SPIRIVA) 18 MCG inhalation capsule Place 1 capsule (18 mcg total) into inhaler and inhale daily.  30 capsule  11   No current facility-administered medications for this visit.    No Known Allergies  Review of Systems negative except from HPI and PMH  Physical Exam BP 130/92  Pulse 91  Ht 5\' 9"  (1.753 m)  Wt 228 lb (103.42  kg)  BMI 33.65 kg/m2 Well developed and well nourished in no acute distress HENT normal E scleral and icterus clear Neck Supple JVP flat; carotids brisk and full Clear to ausculation Fast and irregular  rate and rhythm, no murmurs gallops or rub Soft with active bowel sounds No clubbing cyanosis Trace Edema Alert and oriented, grossly normal motor and sensory function Skin Warm and Dry    Assessment and  Plan  Atrial flutter-persistent  Vertigo/headache  Hypertension  Coronary disease with prior stenting  HFpEF  Given his new onset headache in the context of anticoagulation CT scanning is indicated.  We will reschedule his catheter ablation 10-14 days for now and we have gone over the instructions reminding him that he should not interrupt his anticoagulation prior to the procedure.  Cath was unremarkable January 2015

## 2013-10-31 NOTE — Progress Notes (Addendum)
Site area: rt groin Site Prior to Removal:  Level 0 Pressure Applied For: 15 minutes Manual:   yes Patient Status During Pull:  stable Post Pull Site:  Level 0 Post Pull Instructions Given:  yes Post Pull Pulses Present: yes Dressing Applied:  tegaderm Bedrest begins @ 1120 Comments: no complications. IV saline locked

## 2013-10-31 NOTE — Progress Notes (Signed)
With significant sinus node dysfunction will d/c dilt, decrease metoprolol and add amlodipine for alternative blood pressure control Will avoid ACE/ARB given the confusion of hyperkalemia in the past

## 2013-10-31 NOTE — Anesthesia Postprocedure Evaluation (Signed)
  Anesthesia Post-op Note  Patient: Donald Juarez  Procedure(s) Performed: Procedure(s): ATRIAL FLUTTER ABLATION (N/A)  Patient Location: Cath Lab  Anesthesia Type:General  Level of Consciousness: awake and alert   Airway and Oxygen Therapy: Patient Spontanous Breathing  Post-op Pain: none  Post-op Assessment: Post-op Vital signs reviewed, Patient's Cardiovascular Status Stable, Respiratory Function Stable, Patent Airway, No signs of Nausea or vomiting and Pain level controlled  Post-op Vital Signs: Reviewed and stable  Last Vitals:  Filed Vitals:   10/31/13 1243  BP: 122/71  Pulse: 66  Temp: 36.3 C  Resp: 18    Complications: No apparent anesthesia complications

## 2013-10-31 NOTE — Interval H&P Note (Signed)
History and Physical Interval Note:  10/31/2013 7:49 AM  Donald Juarez  has presented today for surgery, with the diagnosis of aflutter  The various methods of treatment have been discussed with the patient and family. After consideration of risks, benefits and other options for treatment, the patient has consented to  Procedure(s): ATRIAL FLUTTER ABLATION (N/A) as a surgical intervention .  The patient's history has been reviewed, patient examined, no change in status, stable for surgery.  I have reviewed the patient's chart and labs.  Questions were answered to the patient's satisfaction.     Virl Axe  K is elevated and this has been true in the past    He says it has normalized repeatedly  will recheck stat

## 2013-10-31 NOTE — Progress Notes (Signed)
Pt discharged to home.  Pt left floor in wheelchair with belongings and escort of RN.  Pt in no distress, with no complaints.  Pt's right hand and left wrist IV's removed along with telemetry.  VS stable as charted.  Pt left with wife and niece in private car after discharge instructions reviewed with patient.

## 2013-10-31 NOTE — Discharge Summary (Signed)
Discharge Summary   Patient ID: Donald Juarez MRN: [id number], DOB/AGE: [birth year] 68 y.o. Admit date: 10/31/2013 D/C date:     10/31/2013  Primary Cardiologist: Dr. Caryl Comes   Principal Problem:   Typical atrial flutter Active Problems:   COPD with asthma   Solitary pulmonary nodule   Bilateral claudication of lower limb   Coronary artery disease   Hypertension   Carotid stenosis    Admission Dates: 10/31/13- 10/31/13 Discharge Diagnosis: PAF and sinus node dysfunction s/p RF catheter ablation, interrogation and reprogramming   HPI: Donald Juarez is a 68 y.o. male with a history of COPD, carotic stenosis, HTN, CAD s/p multiple interventions, SVT, HLD, HEFpEF, SSS s/p MDT PPM and PAF who presented to [facility] for a planned atrial flutter ablation.   He was originally scheduled for catheter ablation on 9/11. Unfortunately, he came in having failed to take his prior 24 hours of apixaban and the procedure was deferred. He was seen in the office on 10/21/13 and complained of abrupt onset of a brief vertiginous-like episode associated with left-sided headache. He also reported continued exercise intolerance. He recalls similar symptoms prior to his stenting in 2012.  He has a hx of CAD with prior stenting of the LCX/OM1 with patent stents on cath 01/2013. Echo was performed, which showed nl LV fxn and mild LAE. Pt also reported a h/o right sided carotid dzs and had not had it evaluated in some time. A carotid U/S was performed and revealed an occluded RICA with 97-35% LICA stenosis and 32% bilat ECA stenosis.    Hospital Course:  Atrial flutter- s/p successful ablation.  -- Pt would like to go home. Very eager to leave.  -- Groins and VS ok  -- Discharge off dilt and lopressor 50 bid and add amlodipine 5  -- Followup [facility] [place] 5-6 weeks  -- Can stop apixoban In 4 weeks  -- Will avoid ACE/ARB given the confusion of hyperkalemia in the past   The patient has had an uncomplicated hospital  course and is recovering well. He has been seen by Dr. Caryl Comes today and deemed ready for discharge home. Discharge medications are listed below.    Discharge Vitals: Blood pressure 137/94, pulse 69, temperature 97.6 F (36.4 C), temperature source Oral, resp. rate 18, height 5\' 9"  (1.753 m), weight 225 lb (102.059 kg), SpO2 97.00%.  Labs: Lab Results  Component Value Date   WBC 5.8 10/29/2013   HGB 15.3 10/31/2013   HCT 45.0 10/31/2013   MCV 94 10/29/2013   PLT 219 10/29/2013     Recent Labs Lab 10/31/13 0806  NA 140  K 4.3  CL 104  CO2 24  BUN 19  CREATININE 0.74  CALCIUM 8.7  GLUCOSE 102*     Diagnostic Studies/Procedures   PHYSICIAN: [name], MD, FACCDATE OF BIRTH: 31-Jul-1945  DATE OF PROCEDURE: 10/31/2013  DATE OF DISCHARGE:  OPERATIVE REPORT  PREOPERATIVE DIAGNOSES: Atrial flutter, sinus node dysfunction,  previously implanted pacemaker.  POSTOPERATIVE DIAGNOSES: Atrial flutter, sinus node dysfunction,  previously implanted pacemaker.  PROCEDURE: Invasive electrophysiological study-comprehensive left  atrial mapping and pacing coronaries, RF catheter ablation, pacemaker  interrogation and reprogramming.  DESCRIPTION OF PROCEDURE: Following obtaining informed consent, the  patient was brought to the electrophysiology laboratory and placed on  the fluoroscopic table in supine position. After routine prep and  drape, cardiac catheterization was performed with local anesthesia and  general anesthesia. Please see the anesthetic record.  Catheterization was performed and  following the procedure, the catheters  were removed. Hemostasis was obtained. The patient was transferred to  the holding area for sheath removal.  CATHETERS: A 5-French quadripolar catheter was inserted via the left  femoral vein to the AV junction. A 6-French octapolar catheter was  inserted in the right femoral vein to the coronary sinus.  A 7-French dual decapolar catheter was inserted via  the left femoral  vein to the tricuspid anulus.  An 8-French 10 mm deflectable tip ablation catheter was inserted via the  right femoral vein using an SAFL sheath to mapping sites in the  posterior septal space.  Surface leads, 1, aVF and V1 were monitored continuously throughout the  procedure, V1 was turned off because of noise.  Following insertion of the catheters, a stimulation protocol included  incremental atrial pacing and incremental ventricular pacing.  Single atrial extrastimuli at a paced cycle length of 600 milliseconds.  END-TIDAL RESULTS AND BASIC INTERVALS: Initial and final rhythm:  Atrial flutter; RR interval 752 milliseconds; AA interval 280  milliseconds; PR interval N/A; QRS duration 88 milliseconds; QTc  interval N/M.  AH interval N/A; HV interval: 40 milliseconds.  FINAL: Rhythm: Sinus; RR interval 1342 milliseconds; PR interval 210  milliseconds; P-wave duration 130 millisecond; QRS duration 98  millisecond; QT interval 452 milliseconds; AH interval 138 milliseconds;  HV interval 55 milliseconds.  AV NODAL FUNCTION: AV Wenckebach was 450 milliseconds.  VA conduction was dissociated at 600 milliseconds.  The AV nodal effective refractory period at 600 milliseconds with 360  milliseconds without evidence of dual AV nodal physiology.  ACCESSORY PATHWAY FUNCTION: No evidence of any accessory pathway was  identified.  Ventricular response to programmed stimulation was normal as described  above.  ARRHYTHMIAS INDUCED: The patient presented to the lab in atrial  flutter. Electrogram mapping demonstrated counter-clockwise isthmus  conduction. Catheter ablation across the cavotricuspid isthmus  terminated atrial flutter and resulted in bidirectional isthmus  conduction block.  A total of 6 minutes and 58 seconds of RF energy was applied in 5  applications across the cavotricuspid isthmus.  Following ablation, the A1 A2 interval was 170 milliseconds.  FLUOROSCOPY  TIME: A total of 6.2 minutes of fluoroscopy time was  utilized.  IMPRESSION:  1. Sinus bradycardia.  2. Abnormal atrial function with atrial flutter-sustained with  successful catheter ablation of the cavotricuspid isthmus with  bidirectional isthmus conduction block.  3. Normal AV nodal function.  4. Normal His-Purkinje system function.  5. No accessory pathway.  6. Normal ventricular response to programmed stimulation.  7. Normal device function with an elevated right atrial pacing  threshold at 2.5 V at 0.76 milliseconds.  Summary of conclusion, the results of electrophysiological testing  confirmed cavotricuspid isthmus dependent atrial flutter. Catheter  ablation successfully interrupted the ablation at the cavotricuspid  isthmus and resulted in bidirectional isthmus conduction block.  Interrogation of the atrial lead following the ablation procedure  demonstrated a high atrial threshold. The device was programmed at 4 V  at 0.7 milliseconds. There was intrinsic conduction.    Discharge Medications     Medication List    STOP taking these medications       diltiazem 360 MG 24 hr capsule  Commonly known as:  TIAZAC      TAKE these medications       albuterol (2.5 MG/3ML) 0.083% nebulizer solution  Commonly known as:  PROVENTIL  Take 3 mLs (2.5 mg total) by nebulization every 6 (six) hours as needed for wheezing or  shortness of breath. DX code 493.20     amLODipine 5 MG tablet  Commonly known as:  NORVASC  Take 1 tablet (5 mg total) by mouth daily.     apixaban 5 MG Tabs tablet  Commonly known as:  ELIQUIS  Take 1 tablet (5 mg total) by mouth 2 (two) times daily. Can stop this drug after 4 weeks     arformoterol 15 MCG/2ML Nebu  Commonly known as:  BROVANA  Take 2 mLs (15 mcg total) by nebulization 2 (two) times daily. DX code 493.20     aspirin 81 MG tablet  Take 81 mg by mouth daily.     atorvastatin 40 MG tablet  Commonly known as:  LIPITOR  Take 40 mg by  mouth daily.     budesonide 0.25 MG/2ML nebulizer solution  Commonly known as:  PULMICORT  Take 0.25 mg by nebulization 2 (two) times daily.     fluticasone 50 MCG/ACT nasal spray  Commonly known as:  FLONASE  Place 1 spray into both nostrils daily.     isosorbide mononitrate 30 MG 24 hr tablet  Commonly known as:  IMDUR  Take 30 mg by mouth daily.     metoprolol 50 MG tablet  Commonly known as:  LOPRESSOR  Take 2 tablets (100 mg total) by mouth 2 (two) times daily.     tiotropium 18 MCG inhalation capsule  Commonly known as:  SPIRIVA  Place 1 capsule (18 mcg total) into inhaler and inhale daily.     Vitamin D 2000 UNITS tablet  Take 2,000 Units by mouth daily.        Disposition   The patient will be discharged in stable condition to home.  Follow-up Information   Follow up with Virl Axe, MD. (Please follow up wtih Dr. Caryl Comes in 4-6 weeks  )    Specialty:  Cardiology   Contact information:   [place] [phone]         Duration of Discharge Encounter: Greater than 30 minutes including physician and PA time.  Mable Fill R PA-C 10/31/2013, 8:08 PM

## 2013-10-31 NOTE — Anesthesia Preprocedure Evaluation (Addendum)
Anesthesia Evaluation  Patient identified by MRN, date of birth, ID band Patient awake    Reviewed: Allergy & Precautions, H&P , NPO status , Patient's Chart, lab work & pertinent test results, reviewed documented beta blocker date and time   History of Anesthesia Complications Negative for: history of anesthetic complications  Airway Mallampati: II TM Distance: >3 FB Neck ROM: Full    Dental  (+) Edentulous Upper, Edentulous Lower   Pulmonary shortness of breath and with exertion, asthma , COPD COPD inhaler, former smoker,  breath sounds clear to auscultation        Cardiovascular hypertension, Pt. on home beta blockers and Pt. on medications + CAD, + Cardiac Stents and + Peripheral Vascular Disease + dysrhythmias Atrial Fibrillation and Supra Ventricular Tachycardia + pacemaker Rhythm:Irregular  EF 50-55%. Valves normal.   Neuro/Psych negative neurological ROS  negative psych ROS   GI/Hepatic negative GI ROS, Neg liver ROS,   Endo/Other  Morbid obesity  Renal/GU negative Renal ROS  negative genitourinary   Musculoskeletal negative musculoskeletal ROS (+)   Abdominal   Peds  Hematology negative hematology ROS (+)   Anesthesia Other Findings   Reproductive/Obstetrics negative OB ROS                        Anesthesia Physical  Anesthesia Plan  ASA: III  Anesthesia Plan: General   Post-op Pain Management:    Induction: Intravenous  Airway Management Planned: Oral ETT  Additional Equipment: None  Intra-op Plan:   Post-operative Plan: Extubation in OR  Informed Consent: I have reviewed the patients History and Physical, chart, labs and discussed the procedure including the risks, benefits and alternatives for the proposed anesthesia with the patient or authorized representative who has indicated his/her understanding and acceptance.   Dental advisory given  Plan Discussed with:  Surgeon and CRNA  Anesthesia Plan Comments:       Anesthesia Quick Evaluation

## 2013-10-31 NOTE — Interval H&P Note (Signed)
History and Physical Interval Note:  10/31/2013 7:47 AM  Donald Juarez  has presented today for surgery, with the diagnosis of aflutter  The various methods of treatment have been discussed with the patient and family. After consideration of risks, benefits and other options for treatment, the patient has consented to  Procedure(s): ATRIAL FLUTTER ABLATION (N/A) as a surgical intervention .  The patient's history has been reviewed, patient examined, no change in status, stable for surgery.  I have reviewed the patient's chart and labs.  Questions were answered to the patient's satisfaction.     Virl Axe

## 2013-10-31 NOTE — Anesthesia Procedure Notes (Signed)
Procedure Name: Intubation Date/Time: 10/31/2013 9:15 AM Performed by: Susa Loffler Pre-anesthesia Checklist: Patient identified, Timeout performed, Emergency Drugs available, Suction available and Patient being monitored Patient Re-evaluated:Patient Re-evaluated prior to inductionOxygen Delivery Method: Circle system utilized Preoxygenation: Pre-oxygenation with 100% oxygen Intubation Type: IV induction Ventilation: Mask ventilation without difficulty and Oral airway inserted - appropriate to patient size Laryngoscope Size: Mac and 4 Grade View: Grade I Tube type: Oral Tube size: 7.5 mm Number of attempts: 1 Airway Equipment and Method: Stylet Placement Confirmation: ETT inserted through vocal cords under direct vision,  positive ETCO2 and breath sounds checked- equal and bilateral Secured at: 22 cm Tube secured with: Tape Dental Injury: Teeth and Oropharynx as per pre-operative assessment

## 2013-10-31 NOTE — Progress Notes (Signed)
Pt would like to go home Groins and VS ok Discharge off dilt and lopressor 50 bid and add amlodipine 5   followup [facility] [place] 5-6 weeks  Can stop apixoban  In 4 weeks

## 2013-10-31 NOTE — Transfer of Care (Signed)
Immediate Anesthesia Transfer of Care Note  Patient: Donald Juarez  Procedure(s) Performed: Procedure(s): ATRIAL FLUTTER ABLATION (N/A)  Patient Location: Cath Lab  Anesthesia Type:General  Level of Consciousness: awake, alert  and oriented  Airway & Oxygen Therapy: Patient Spontanous Breathing and Patient connected to nasal cannula oxygen  Post-op Assessment: Report given to PACU RN and Post -op Vital signs reviewed and stable  Post vital signs: Reviewed and stable  Complications: No apparent anesthesia complications

## 2013-10-31 NOTE — Progress Notes (Signed)
Site area: Left  Groin  a  23fr and 59fr venous sheath was removed2  Site Prior to Removal:  Level  0   Pressure Applied For 15  MINUTES    Minutes Beginning at  1110am  Manual:   Yes.    Patient Status During Pull:  stable  Post Pull Groin Site:  Level 0  Post Pull Instructions Given:  Yes.    Post Pull Pulses Present:  Yes.    Dressing Applied:  Yes.    Comments:  VS remain stable during sheath pull.  Pt denies any discomfort at the left groin site at this time

## 2013-11-03 LAB — POCT I-STAT, CHEM 8
BUN: 26 mg/dL — AB (ref 6–23)
CREATININE: 0.8 mg/dL (ref 0.50–1.35)
Calcium, Ion: 1.04 mmol/L — ABNORMAL LOW (ref 1.13–1.30)
Chloride: 112 mEq/L (ref 96–112)
Glucose, Bld: 102 mg/dL — ABNORMAL HIGH (ref 70–99)
HCT: 45 % (ref 39.0–52.0)
Hemoglobin: 15.3 g/dL (ref 13.0–17.0)
POTASSIUM: 7.4 meq/L — AB (ref 3.7–5.3)
Sodium: 134 mEq/L — ABNORMAL LOW (ref 137–147)
TCO2: 27 mmol/L (ref 0–100)

## 2013-11-10 ENCOUNTER — Telehealth: Payer: Self-pay

## 2013-11-10 NOTE — Telephone Encounter (Signed)
Pt states he has an appt with a Dr. This week about his legs ? Not sure the name of the Dr.. Would like for you to cancel that appt.

## 2013-11-10 NOTE — Telephone Encounter (Signed)
Informed patient per verbal from Dr. [name]  As long as patient is not having any pain or issues with his leg he can be seen in January 2016 Patient verbalized understanding

## 2013-11-10 NOTE — Telephone Encounter (Signed)
Patient stated he needs to cancel his aapt with Dr. Fletcher Anon this week  He states he can no longer afford to see the doctor until the first of 2016 I informed him that this appt was scheduled for an abnormal test result  He asked me to ask Dr. Fletcher Anon if there was any way we could just explain over the phone

## 2013-11-13 ENCOUNTER — Ambulatory Visit: Payer: Medicare Other | Admitting: Cardiovascular Disease

## 2013-11-14 ENCOUNTER — Ambulatory Visit (INDEPENDENT_AMBULATORY_CARE_PROVIDER_SITE_OTHER): Payer: Medicare Other | Admitting: Pulmonary Disease

## 2013-11-14 ENCOUNTER — Encounter: Payer: Self-pay | Admitting: Pulmonary Disease

## 2013-11-14 ENCOUNTER — Ambulatory Visit (INDEPENDENT_AMBULATORY_CARE_PROVIDER_SITE_OTHER)
Admission: RE | Admit: 2013-11-14 | Discharge: 2013-11-14 | Disposition: A | Discharge status: Home / Self Care | Payer: Medicare Other | Source: Ambulatory Visit | Attending: Pulmonary Disease | Admitting: Pulmonary Disease

## 2013-11-14 VITALS — BP 128/68 | HR 100 | Ht 69.0 in | Wt 230.8 lb

## 2013-11-14 DIAGNOSIS — J441 Chronic obstructive pulmonary disease with (acute) exacerbation: Secondary | ICD-10-CM

## 2013-11-14 DIAGNOSIS — R059 Cough, unspecified: Secondary | ICD-10-CM

## 2013-11-14 DIAGNOSIS — R5383 Other fatigue: Secondary | ICD-10-CM

## 2013-11-14 DIAGNOSIS — R05 Cough: Secondary | ICD-10-CM

## 2013-11-14 DIAGNOSIS — R911 Solitary pulmonary nodule: Secondary | ICD-10-CM

## 2013-11-14 MED ORDER — DOXYCYCLINE HYCLATE 100 MG PO CAPS: 100.0000 mg | ORAL_CAPSULE | Freq: Two times a day (BID) | ORAL | Status: DC

## 2013-11-14 NOTE — Progress Notes (Signed)
Subjective:    Patient ID: Donald Juarez, male    DOB: June 30, 1945, 68 y.o.   MRN: [id number]  Synopsis: Donald Juarez first saw the [facility] in 2014 for COPD after smoking 1 PPD for 30 years and quitting in 2010.  He has frequent exacerbations.    03/2013 Full PFT> Ratio 53% FEV1 1.89 (65% pred, 13% change), TLC 5.83 L (93% pred), DLCO 16.6 (69% pred)  HPI  Chief Complaint  Patient presents with  . Follow-up    Pt c/o sudden worsening sob with any exertion X4-5 weeks.  Prod cough with  clear mucus, sinus congestion, wheezing, runny nose, PND.  Feels like his meds aren't working anymore.    11/14/2013 [name] > Issachar is still struggling with dyspnea relieved by albuterol.  He is getting chest congestion and wheezing.  He typically produces clear mucus which is thin.  He says that his nebulizer machine is blowing the hose off the nebulizer.  He has been more dyspnic since his recent a-flutter ablation.  He has a hard time sleeping.   He also says that he can't sleep well.  He wakes up frequently in the night and only sleeps about 4 hours tops, usually only 45 minutes at a time.  He snores.  He is sleepy in the daytime, no naps, no morning headaches, but doesn't feel well rested in the mornings.     Past Medical History  Diagnosis Date  . COPD (chronic obstructive pulmonary disease)   . Carotid stenosis     a. 07/2013 Carotid U/S: 100% RICA (pt prev reported h/o 65%), LICA 46-50%, bilat ECA 50%, normal vertebrals and subclavians bilat-->F/U needed in 01/2014.  [name] Rectal cancer     a. Status post colostomy  . Hypertension   . Paroxysmal atrial flutter     a. CHA2DS2VASc = 3 - on eliquis;  b. 07/2013 Echo: EF 50-55%, mildly dil LA.  [name] Syncope and collapse   . Coronary artery disease     a. 06/1996 & 03/2005 Cath: non-obs dzs;  b. 07/2009 NSTEMI/Cath: LCX 99->PCI/DES extending into OM1;  c. 01/2013 Cath: LM nl, LAD 30p, 46m, LCX 30ost, 20p ISR, 13m, OM1 patent stent, OM2 30/small,  RCA 20/40p, 66m, RPL 75, EF 55%-->Med Rx.  [name] Hyperlipidemia   . Cataract, right     a. 10/2010 s/p cataract surgery   . Shingles     a. 09/2012.  . Pulmonary nodule, right     a. 0.7cm RLL - stable by CT 06/2013.  . Bilateral claudication of lower limb   . SSS (sick sinus syndrome)     a. 12/2009 S/P MDT PPM ([name])  . Hyperkalemia   . Dysrhythmia     ATRIAL FLUTTER     Review of Systems  Constitutional: Positive for fatigue. Negative for fever and chills.  HENT: Negative for postnasal drip, rhinorrhea and sinus pressure.   Respiratory: Positive for cough, shortness of breath and wheezing.   Cardiovascular: Negative for chest pain, palpitations and leg swelling.       Objective:   Physical Exam  Filed Vitals:   11/14/13 1030  BP: 128/68  Pulse: 100  Height: 5\' 9"  (1.753 m)  Weight: 230 lb 12.8 oz (104.69 kg)  SpO2: 95%    RA  Gen: well appearing, no acute distress HEENT: NCAT, EOMi, OP clear PULM: wheezing upper lobes, no crackles, OK air movement CV: RRR, no mgr, no JVD AB: BS+, soft, nontender Ext: warm, no  edema, no clubbing, no cyanosis Derm: bruising over extensor surfaces of forearms       Assessment & Plan:   COPD exacerbation Parris has really struggled in the last year.  I have tested him extensively with sputum cultures, PFTs, and a CT of the chest and cannot find anything other than moderate airflow obstruction from COPD and recurrent exacerbations indicating severe disease.  I have changed his medications and he is taking them appopriately.  However, I will ask him to start using the pulmicort and brovana separately again.  Plan: -doxycycline 7 days, he refused prednisone -continue pulmicort and brovana, use them separately (don't pour them in together) -continue Spiriva -prescreened for Galathea, a biologic agent trial for patients with COPD and eosinophilia> he is interested  Solitary pulmonary nodule No follow up needed as these were stable  over 7 years  Fatigue It sounds like he has obstructive sleep apnea. He is overweight, has a thick neck, doesn't sleep well, and feels sleepy all day.  He does not feel well rested in the mornings.    Plan: -because of the high overlap between COPD and OSA and his symptoms I will order a split night study.    Updated Medication List Outpatient Encounter Prescriptions as of 11/14/2013  Medication Sig  . albuterol (PROVENTIL) (2.5 MG/3ML) 0.083% nebulizer solution Take 3 mLs (2.5 mg total) by nebulization every 6 (six) hours as needed for wheezing or shortness of breath. DX code 493.20  . amLODipine (NORVASC) 5 MG tablet Take 1 tablet (5 mg total) by mouth daily.  [name] apixaban (ELIQUIS) 5 MG TABS tablet Take 1 tablet (5 mg total) by mouth 2 (two) times daily. Can stop this drug after 4 weeks  . arformoterol (BROVANA) 15 MCG/2ML NEBU Take 2 mLs (15 mcg total) by nebulization 2 (two) times daily. DX code 493.20  . aspirin 81 MG tablet Take 81 mg by mouth daily.  [name] atorvastatin (LIPITOR) 40 MG tablet Take 40 mg by mouth daily.  . budesonide (PULMICORT) 0.25 MG/2ML nebulizer solution Take 0.25 mg by nebulization 2 (two) times daily.  . Cholecalciferol (VITAMIN D) 2000 UNITS tablet Take 2,000 Units by mouth daily.  . fluticasone (FLONASE) 50 MCG/ACT nasal spray Place 1 spray into both nostrils daily.   . isosorbide mononitrate (IMDUR) 30 MG 24 hr tablet Take 30 mg by mouth daily.  . metoprolol (LOPRESSOR) 50 MG tablet Take 50 mg by mouth 2 (two) times daily.  [name] tiotropium (SPIRIVA) 18 MCG inhalation capsule Place 1 capsule (18 mcg total) into inhaler and inhale daily.  . [DISCONTINUED] metoprolol (LOPRESSOR) 50 MG tablet Take 2 tablets (100 mg total) by mouth 2 (two) times daily.  [name] doxycycline (VIBRAMYCIN) 100 MG capsule Take 1 capsule (100 mg total) by mouth 2 (two) times daily.

## 2013-11-14 NOTE — Assessment & Plan Note (Signed)
No follow up needed as these were stable over 7 years

## 2013-11-14 NOTE — Assessment & Plan Note (Signed)
It sounds like he has obstructive sleep apnea. He is overweight, has a thick neck, doesn't sleep well, and feels sleepy all day.  He does not feel well rested in the mornings.    Plan: -because of the high overlap between COPD and OSA and his symptoms I will order a split night study.

## 2013-11-14 NOTE — Patient Instructions (Addendum)
Stop mixing your pulmicort and brovana together, talk to the pharmacist about this Call [facility] and ask them to look at your nebulizer machine, you may need a new one Take the doxyxycline for a week, take it with a probiotic Get a flu shot next week We will call you with the result of your Chest x-ray You need to have a sleep study, we will arrange this in [place] with Dr. Gwenette Greet We will see you back in 4-6 weeks

## 2013-11-14 NOTE — Assessment & Plan Note (Signed)
Donald Juarez has really struggled in the last year.  I have tested him extensively with sputum cultures, PFTs, and a CT of the chest and cannot find anything other than moderate airflow obstruction from COPD and recurrent exacerbations indicating severe disease.  I have changed his medications and he is taking them appopriately.  However, I will ask him to start using the pulmicort and brovana separately again.  Plan: -doxycycline 7 days, he refused prednisone -continue pulmicort and brovana, use them separately (don't pour them in together) -continue Spiriva -prescreened for Galathea, a biologic agent trial for patients with COPD and eosinophilia> he is interested

## 2013-11-17 ENCOUNTER — Telehealth: Payer: Self-pay | Admitting: Pulmonary Disease

## 2013-11-17 NOTE — Telephone Encounter (Signed)
Will forward to Dr. Lake Bells as an Juluis Rainier

## 2013-11-19 NOTE — Progress Notes (Signed)
Quick Note:  lmtcb X1 to relay results/recs. Place cxr order after speaking with pt for [facility]. ______

## 2013-11-21 ENCOUNTER — Telehealth: Payer: Self-pay | Admitting: Pulmonary Disease

## 2013-11-21 DIAGNOSIS — R911 Solitary pulmonary nodule: Secondary | ICD-10-CM

## 2013-11-21 NOTE — Telephone Encounter (Signed)
Message copied by Len Blalock on Fri Nov 21, 2013 11:25 AM ------      Message from: Donald Juarez      Created: Mon Nov 17, 2013  9:46 PM       Caryl Pina,            Please let them know that the recent chest x-ray showed a nodule in the left lung. I do not think that this is anything new as his CT scans have been normal. Please order a repeat chest x-ray for when he sees me next time, reason pulmonary nodule            Thank,      Ruby Cola ------

## 2013-11-21 NOTE — Telephone Encounter (Signed)
Spoke with pt, he is aware of the lung nodule.  States it's been stable for a few years now, followed up with annual ct scans.  Aware of cxr on next visit. Order placed.  Nothing further needed.

## 2013-12-07 ENCOUNTER — Encounter (HOSPITAL_COMMUNITY): Payer: Self-pay | Admitting: *Deleted

## 2013-12-08 ENCOUNTER — Telehealth: Payer: Self-pay | Admitting: Pulmonary Disease

## 2013-12-08 MED ORDER — CEFDINIR 300 MG PO CAPS
300.0000 mg | ORAL_CAPSULE | Freq: Two times a day (BID) | ORAL | Status: DC
Start: 2013-12-08 — End: 2013-12-17

## 2013-12-08 MED ORDER — PREDNISONE 10 MG PO TABS: ORAL_TABLET | ORAL | Status: DC

## 2013-12-08 NOTE — Telephone Encounter (Signed)
Spoke with pt, he is c/o worsening sob with any exertion, prod cough with clear mucus.  No fever, no sinus congestion.  This has been worsening for several days.  Was put on doxycycline at last visit, states that helped him for only a few days.  Is requesting a prednisone taper to be sent to [facility].  Last visit 10/16, next visit 11/18.    MR please advise.  Thank you.

## 2013-12-08 NOTE — Telephone Encounter (Signed)
HE is having AECOPD  This time try   Cefdinir 300mg  twice daily x 5 days  Take prednisone 40 mg daily x 2 days, then 20mg  daily x 2 days, then 10mg  daily x 2 days, then 5mg  daily x 2 days and stop  Thanks  Dr. Brand Males, M.D., [facility].C.P Pulmonary and Critical Care Medicine Staff Physician [facility] Pager: [phone], If no answer or between  15:00h - 7:00h: call [phone]  12/08/2013 10:34 AM

## 2013-12-08 NOTE — Telephone Encounter (Signed)
Spoke with pt Aware of rec's per Dr Chase Caller Rx's sent to [facility]  Nothing further needed.

## 2013-12-17 ENCOUNTER — Ambulatory Visit (INDEPENDENT_AMBULATORY_CARE_PROVIDER_SITE_OTHER): Payer: Medicare Other | Admitting: Pulmonary Disease

## 2013-12-17 ENCOUNTER — Encounter: Payer: Self-pay | Admitting: Pulmonary Disease

## 2013-12-17 ENCOUNTER — Ambulatory Visit: Payer: Medicare Other

## 2013-12-17 VITALS — BP 110/72 | HR 81 | Ht 69.0 in | Wt 227.0 lb

## 2013-12-17 DIAGNOSIS — I4892 Unspecified atrial flutter: Secondary | ICD-10-CM

## 2013-12-17 DIAGNOSIS — J432 Centrilobular emphysema: Secondary | ICD-10-CM

## 2013-12-17 DIAGNOSIS — IMO0001 Reserved for inherently not codable concepts without codable children: Secondary | ICD-10-CM

## 2013-12-17 DIAGNOSIS — J449 Chronic obstructive pulmonary disease, unspecified: Secondary | ICD-10-CM

## 2013-12-17 DIAGNOSIS — Z23 Encounter for immunization: Secondary | ICD-10-CM

## 2013-12-17 NOTE — Progress Notes (Signed)
Subjective:    Patient ID: Donald Juarez, male    DOB: [birth year], 68 y.o.   MRN: [id number]  Synopsis: Donald Juarez first saw the [facility] Pulmonary clinic in 2014 for COPD after smoking 1 PPD for 30 years and quitting in 2010.  He has frequent exacerbations.    03/2013 Full PFT> Ratio 53% FEV1 1.89 (65% pred, 13% change), TLC 5.83 L (93% pred), DLCO 16.6 (69% pred)  HPI  Chief Complaint  Patient presents with  . Follow-up    pt c/o sob, prod cough with clear mucus.  Was better after taking prednisone, has worsened since coming off prednisone.     12/17/2013 [name] > Donald Juarez says that he continue to struggle with worsening dyspnea and cough with mucus production which is worse when he lies flat at night.  He has coughs out clear mucus mostly at night.  He recently had a humidfier installed 3 weeks ago.  He sleeps on the on the couch to be near his wife.  He notes more dyspnea when he is cooking.  He has tried wearing a mask when he gets outside to cut the grass, etc.  He notes that his dyspnea increased years ago as they wer stepping up his metoprolol for Afib.  He had water damage in his house years ago but this was remedied.  No animals in the house.    Past Medical History  Diagnosis Date  . COPD (chronic obstructive pulmonary disease)   . Carotid stenosis     a. 07/2013 Carotid U/S: 100% RICA (pt prev reported h/o 81%), LICA 27-51%, bilat ECA 50%, normal vertebrals and subclavians bilat-->F/U needed in 01/2014.  [name] Rectal cancer     a. Status post colostomy  . Hypertension   . Paroxysmal atrial flutter     a. CHA2DS2VASc = 3 - on eliquis;  b. 07/2013 Echo: EF 50-55%, mildly dil LA.c. s/p ablation 10/31/2013 by Dr Caryl Comes  . Syncope and collapse   . Coronary artery disease     a. 06/1996 & 03/2005 Cath: non-obs dzs;  b. 07/2009 NSTEMI/Cath: LCX 99->PCI/DES extending into OM1;  c. 01/2013 Cath: LM nl, LAD 30p, 53m, LCX 30ost, 20p ISR, 79m, OM1 patent stent, OM2 30/small, RCA 20/40p, 66m, RPL  75, EF 55%-->Med Rx.  [name] Hyperlipidemia   . Cataract, right     a. 10/2010 s/p cataract surgery   . Shingles     a. 09/2012.  . Pulmonary nodule, right     a. 0.7cm RLL - stable by CT 06/2013.  . Bilateral claudication of lower limb   . SSS (sick sinus syndrome)     a. 12/2009 S/P MDT PPM ([name])  . Hyperkalemia      Review of Systems  Constitutional: Positive for fatigue. Negative for fever and chills.  HENT: Negative for postnasal drip, rhinorrhea and sinus pressure.   Respiratory: Positive for shortness of breath. Negative for cough and wheezing.   Cardiovascular: Negative for chest pain, palpitations and leg swelling.       Objective:   Physical Exam  Filed Vitals:   12/17/13 1056  BP: 110/72  Pulse: 81  Height: 5\' 9"  (1.753 m)  Weight: 227 lb (102.967 kg)  SpO2: 93%    RA  Gen: well appearing, no acute distress HEENT: NCAT, EOMi, OP clear PULM: good air movement, minimal wheezing upper lobes CV: RRR, no mgr, no JVD AB: BS+, soft, nontender Ext: warm, no edema, no clubbing, no cyanosis Derm: bruising over  extensor surfaces of forearms       Assessment & Plan:   COPD, severe As stated previously, [name] has severe COPD based on her recurrent exacerbations he experiences. Interestingly he does have an elevated eosinophil count. I am going to send a serum allergy profile today and I have asked him to look around his house for evidence of mold or mildew.  I also wonder whether or not the metoprolol is contributing to his symptoms. He said that he noted a direct correlation to increasing dyspnea when this medication was originally started and titrated upwards.  Plan: -I will contact his cardiologist about changing to an alternative agent other than metoprolol  -continue nebulized and inhaled therapies as written -Look around the house for mold or mildew -Serum allergy profile -Will refer back to [facility] for consideration of biologic therapy in clinical trial  form for COPD with eosinophilia (Galathea trial)  Paroxysmal atrial flutter He has done well since his ablation. I last his electrophysiology cardiologist if it's possible to change from metoprolol.    Updated Medication List Outpatient Encounter Prescriptions as of 12/17/2013  Medication Sig  . albuterol (PROVENTIL) (2.5 MG/3ML) 0.083% nebulizer solution Take 3 mLs (2.5 mg total) by nebulization every 6 (six) hours as needed for wheezing or shortness of breath. DX code 493.20  . amLODipine (NORVASC) 5 MG tablet Take 1 tablet (5 mg total) by mouth daily.  [name] arformoterol (BROVANA) 15 MCG/2ML NEBU Take 2 mLs (15 mcg total) by nebulization 2 (two) times daily. DX code 493.20  . aspirin 81 MG tablet Take 81 mg by mouth daily.  [name] atorvastatin (LIPITOR) 40 MG tablet Take 40 mg by mouth daily.  . budesonide (PULMICORT) 0.25 MG/2ML nebulizer solution Take 0.25 mg by nebulization 2 (two) times daily.  . Cholecalciferol (VITAMIN D) 2000 UNITS tablet Take 2,000 Units by mouth daily.  . fluticasone (FLONASE) 50 MCG/ACT nasal spray Place 1 spray into both nostrils daily.   . isosorbide mononitrate (IMDUR) 30 MG 24 hr tablet Take 30 mg by mouth daily.  . metoprolol (LOPRESSOR) 50 MG tablet Take 50 mg by mouth 2 (two) times daily.  [name] tiotropium (SPIRIVA) 18 MCG inhalation capsule Place 1 capsule (18 mcg total) into inhaler and inhale daily.  . [DISCONTINUED] apixaban (ELIQUIS) 5 MG TABS tablet Take 1 tablet (5 mg total) by mouth 2 (two) times daily. Can stop this drug after 4 weeks  . [DISCONTINUED] cefdinir (OMNICEF) 300 MG capsule Take 1 capsule (300 mg total) by mouth 2 (two) times daily.  . [DISCONTINUED] doxycycline (VIBRAMYCIN) 100 MG capsule Take 1 capsule (100 mg total) by mouth 2 (two) times daily.  . [DISCONTINUED] predniSONE (DELTASONE) 10 MG tablet Take 4 tabs po x 2 days, then 2 x 2 days, then 1 x 2 days, then 1/2 x 2 days then stop.

## 2013-12-17 NOTE — Patient Instructions (Signed)
Look around your house (humidifier and crawl space) for mold I will call Dr. Caryl Comes about your metoprolol We will contact [facility] about the clinical trial We will see you back in 2 months or sooner if needed

## 2013-12-17 NOTE — Assessment & Plan Note (Signed)
As stated previously, Donald Juarez has severe COPD based on her recurrent exacerbations he experiences. Interestingly he does have an elevated eosinophil count. I am going to send a serum allergy profile today and I have asked him to look around his house for evidence of mold or mildew.  I also wonder whether or not the metoprolol is contributing to his symptoms. He said that he noted a direct correlation to increasing dyspnea when this medication was originally started and titrated upwards.  Plan: -I will contact his cardiologist about changing to an alternative agent other than metoprolol  -continue nebulized and inhaled therapies as written -Look around the house for mold or mildew -Serum allergy profile -Will refer back to [facility] for consideration of biologic therapy in clinical trial form for COPD with eosinophilia (Galathea trial)

## 2013-12-17 NOTE — Assessment & Plan Note (Signed)
He has done well since his ablation. I last his electrophysiology cardiologist if it's possible to change from metoprolol.

## 2013-12-18 ENCOUNTER — Telehealth: Payer: Self-pay

## 2013-12-18 LAB — ALLERGY FULL PROFILE
Allergen, D pternoyssinus,d7: <0.1 kU/L
Allergen,Goose feathers, e70: <0.1 kU/L
Alternaria Alternata: <0.1 kU/L
Bahia Grass: <0.1 kU/L
Bermuda Grass: <0.1 kU/L
Box Elder IgE: <0.1 kU/L
Cat Dander: <0.1 kU/L
Common Ragweed: <0.1 kU/L
Curvularia lunata: <0.1 kU/L
Dog Dander: <0.1 kU/L
Fescue: <0.1 kU/L
G005 Rye, Perennial: <0.1 kU/L
Goldenrod: <0.1 kU/L
Helminthosporium halodes: <0.1 kU/L
House Dust Hollister: <0.1 kU/L
IgE (Immunoglobulin E), Serum: 88 kU/L (ref <115)
Lamb's Quarters: <0.1 kU/L
Plantain: <0.1 kU/L
Stemphylium Botryosum: <0.1 kU/L
Sycamore Tree: <0.1 kU/L

## 2013-12-18 MED ORDER — NEBIVOLOL HCL 5 MG PO TABS: 5.0000 mg | ORAL_TABLET | Freq: Every day | ORAL | Status: DC

## 2013-12-18 NOTE — Telephone Encounter (Signed)
Pt aware of recs. Med sent in to pharmacy.  Nothing further needed.

## 2013-12-18 NOTE — Telephone Encounter (Signed)
-----   Message from Juanito Doom, MD sent at 12/17/2013  5:19 PM EST ----- Caryl Pina,  Please call him and tell him that I want him to stop taking the metoprolol. Instead I want to send a new prescription for Bystolic 5mg  daily.  Thanks [employer]

## 2013-12-22 NOTE — Progress Notes (Signed)
Quick Note:  Pt aware of results. ______ 

## 2013-12-30 ENCOUNTER — Ambulatory Visit (INDEPENDENT_AMBULATORY_CARE_PROVIDER_SITE_OTHER): Payer: Medicare Other | Admitting: Internal Medicine

## 2013-12-30 ENCOUNTER — Other Ambulatory Visit: Payer: Self-pay | Admitting: Internal Medicine

## 2013-12-30 ENCOUNTER — Encounter: Payer: Self-pay | Admitting: Internal Medicine

## 2013-12-30 ENCOUNTER — Other Ambulatory Visit: Payer: Self-pay

## 2013-12-30 VITALS — BP 129/80 | HR 63 | Ht 69.0 in | Wt 227.0 lb

## 2013-12-30 DIAGNOSIS — R0602 Shortness of breath: Secondary | ICD-10-CM

## 2013-12-30 DIAGNOSIS — I4892 Unspecified atrial flutter: Secondary | ICD-10-CM

## 2013-12-30 LAB — MDC_IDC_ENUM_SESS_TYPE_INCLINIC
Battery Impedance: 295 Ohm
Battery Remaining Longevity: 55 mo
Battery Voltage: 2.78 V
Brady Statistic AP VS Percent: 82 %
Date Time Interrogation Session: 20151201083352
Lead Channel Impedance Value: 435 Ohm
Lead Channel Impedance Value: 564 Ohm
Lead Channel Pacing Threshold Amplitude: 0.5 V
Lead Channel Pacing Threshold Pulse Width: 0.4 ms
Lead Channel Pacing Threshold Pulse Width: 0.76 ms
Lead Channel Sensing Intrinsic Amplitude: 1 mV
Lead Channel Sensing Intrinsic Amplitude: 5.6 mV
Lead Channel Setting Pacing Amplitude: 2 V
Lead Channel Setting Pacing Amplitude: 3.5 V
Lead Channel Setting Pacing Pulse Width: 0.4 ms
Lead Channel Setting Sensing Sensitivity: 2.8 mV
MDC IDC MSMT LEADCHNL RA PACING THRESHOLD AMPLITUDE: 2.75 V
MDC IDC STAT BRADY AP VP PERCENT: 6 %
MDC IDC STAT BRADY AS VP PERCENT: 1 %
MDC IDC STAT BRADY AS VS PERCENT: 12 %

## 2013-12-30 MED ORDER — FLUTICASONE PROPIONATE 50 MCG/ACT NA SUSP: 1.0000 | Freq: Every day | NASAL | Status: DC

## 2013-12-30 MED ORDER — ALBUTEROL SULFATE (2.5 MG/3ML) 0.083% IN NEBU: 2.5000 mg | INHALATION_SOLUTION | Freq: Four times a day (QID) | RESPIRATORY_TRACT | Status: DC | PRN

## 2013-12-30 NOTE — Patient Instructions (Addendum)
Your physician has recommended you make the following change in your medication:  [facility] [place] physician recommends that you schedule a follow-up appointment in:  3 months with Dr. Caryl Comes

## 2013-12-30 NOTE — Progress Notes (Signed)
Patient Care Team: Sofie Hartigan, MD as PCP - General (Family Medicine)   HPI  Donald Juarez is a 68 y.o. male Seen in followup for atrial flutter. He was scheduled for catheter ablation on 9/11. Unfortunately, he came in having failed to take his prior 24 hours of apixaban. The patient procedure was undertaken 10/15. He has prior MDT pacemaker insertion for sinus node dysfunction   He continues to complain of exercise intolerance. He recalls similar symptoms prior to his stenting in 2012. He got much better for a few days post ablation and has good days and bad days   We need to get cath records and myvoeiw reports   He is seen pulmonary; he has had no peripheral edema.   CAD with prior stenting of the LCX/OM1 with patent stents on cath 01/2013.   Echo was performed, which showed nl LV fxn and mild LAE. Pt also reported a h/o right sided carotid dzs and had not had it evaluated in some time. A carotid U/S was performed and revealed an occluded RICA with 47-42% LICA stenosis and 59% bilat ECA stenosis.     Past Medical History  Diagnosis Date  . COPD (chronic obstructive pulmonary disease)   . Carotid stenosis     a. 07/2013 Carotid U/S: 100% RICA (pt prev reported h/o 56%), LICA 38-75%, bilat ECA 50%, normal vertebrals and subclavians bilat-->F/U needed in 01/2014.  [name] Rectal cancer     a. Status post colostomy  . Hypertension   . Paroxysmal atrial flutter     a. CHA2DS2VASc = 3 - on eliquis;  b. 07/2013 Echo: EF 50-55%, mildly dil LA.c. s/p ablation 10/31/2013 by Dr Caryl Comes  . Syncope and collapse   . Coronary artery disease     a. 06/1996 & 03/2005 Cath: non-obs dzs;  b. 07/2009 NSTEMI/Cath: LCX 99->PCI/DES extending into OM1;  c. 01/2013 Cath: LM nl, LAD 30p, 38m, LCX 30ost, 20p ISR, 60m, OM1 patent stent, OM2 30/small, RCA 20/40p, 81m, RPL 75, EF 55%-->Med Rx.  [name] Hyperlipidemia   . Cataract, right     a. 10/2010 s/p cataract surgery   . Shingles     a. 09/2012.  . Pulmonary  nodule, right     a. 0.7cm RLL - stable by CT 06/2013.  . Bilateral claudication of lower limb   . SSS (sick sinus syndrome)     a. 12/2009 S/P MDT PPM ([name])  . Hyperkalemia     Past Surgical History  Procedure Laterality Date  . Cardiac surgery  2011    Stents placed   . Cataract surgery  2012  . Colon surgery      cancer removal  . Knee arthroscopy Right 1980's  . Cardiac catheterization  07/2009    [facility]  . Cardiac catheterization  01/2013    [facility]  . Insert / replace / remove pacemaker    . Ablation  10/31/2013    CTI ablation by Dr Caryl Comes for atrial flutter    Current Outpatient Prescriptions  Medication Sig Dispense Refill  . albuterol (PROVENTIL) (2.5 MG/3ML) 0.083% nebulizer solution Take 3 mLs (2.5 mg total) by nebulization every 6 (six) hours as needed for wheezing or shortness of breath. DX code 493.20 120 mL 3  . amLODipine (NORVASC) 5 MG tablet Take 1 tablet (5 mg total) by mouth daily. 30 tablet 3  . arformoterol (BROVANA) 15 MCG/2ML NEBU Take 2 mLs (15 mcg total) by nebulization 2 (two) times daily.  DX code 493.20    . aspirin 81 MG tablet Take 81 mg by mouth daily.    [name] atorvastatin (LIPITOR) 40 MG tablet Take 40 mg by mouth daily.    . budesonide (PULMICORT) 0.25 MG/2ML nebulizer solution Take 0.25 mg by nebulization 2 (two) times daily.    . Cholecalciferol (VITAMIN D) 2000 UNITS tablet Take 2,000 Units by mouth daily.    . fluticasone (FLONASE) 50 MCG/ACT nasal spray Place 1 spray into both nostrils daily.     . isosorbide mononitrate (IMDUR) 30 MG 24 hr tablet Take 30 mg by mouth daily.    . nebivolol (BYSTOLIC) 5 MG tablet Take 1 tablet (5 mg total) by mouth daily. 30 tablet 2  . tiotropium (SPIRIVA) 18 MCG inhalation capsule Place 1 capsule (18 mcg total) into inhaler and inhale daily. 30 capsule 11   No current facility-administered medications for this visit.    No Known Allergies  Review of Systems negative except from HPI and PMH  Physical  Exam BP 129/80 mmHg  Pulse 63  Ht 5\' 9"  (1.753 m)  Wt 227 lb (102.967 kg)  BMI 33.51 kg/m2 Well developed and well nourished in no acute distress HENT normal E scleral and icterus clear Neck Supple JVP flat; carotids brisk and full Clear to ausculation Fast and irregular  rate and rhythm, no murmurs gallops or rub Soft with active bowel sounds No clubbing cyanosis Trace Edema Alert and oriented, grossly normal motor and sensory function Skin Warm and Dry  ECG demonstrates atrially paced rhythm with frequent PVCs  Assessment and  Plan  Atrial flutter-s/p ablation  Sinus node dysfunction   Hypertension  Carotid stenosis  Coronary disease with prior stenting  HFpEF  COPD He has had no recurrent atrial flutter. Out of the ablation it has become evident that he has sinus node dysfunction. He is atrially paced about 85% of the time it is possible that the 15% residual is related to PVCs. 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 We will need to follow this along and he may well need Holter monitoring.  His sinus node dysfunction may be further aggravated by his beta blockers. While he has a history of hypertension, is well-controlled today. We will stop his bisoprolol.  His carotid stenosis is stable; however, he is approaching a threshold at which point endarterectomy would be appropriately considered. We will plan to recheck his carotid Dopplers at next visit.  I'm also concerned that the coronary disease is contributing to his functional status. We will need to retrieve the records from the prior hospital evaluation one year ago. It may be appropriate to reconsider Myoview scanning

## 2014-01-02 ENCOUNTER — Telehealth: Payer: Self-pay | Admitting: Pulmonary Disease

## 2014-01-02 MED ORDER — LEVOFLOXACIN 500 MG PO TABS: 500.0000 mg | ORAL_TABLET | Freq: Every day | ORAL | Status: DC

## 2014-01-02 MED ORDER — PREDNISONE 10 MG PO TABS: ORAL_TABLET | ORAL | Status: DC

## 2014-01-02 NOTE — Telephone Encounter (Signed)
Rx for AECOPD  Please take prednisone 40 mg x1 day, then 30 mg x1 day, then 20 mg x1 day, then 10 mg x1 day, and then 5 mg x1 day and stop  Also, recommend .  take levaquin 500mg  once daily  X 5 days

## 2014-01-02 NOTE — Telephone Encounter (Signed)
Pt aware of results.  meds called in.  Nothing further needed.

## 2014-01-02 NOTE — Telephone Encounter (Signed)
Pt c/o prod cough with clear mucus, increased sob and wheezing with exertion since Tuesday.  Has been taking mucinex which has not helped.   Pt is requesting prednisone sent to [facility].   Since BQ is on vacation sending to doc of day.  MR please advise.  Thanks!  Last visit: 11/18 Next visit: 12/10  No Known Allergies Current Outpatient Prescriptions on File Prior to Visit  Medication Sig Dispense Refill  . albuterol (PROVENTIL) (2.5 MG/3ML) 0.083% nebulizer solution Take 3 mLs (2.5 mg total) by nebulization every 6 (six) hours as needed for wheezing or shortness of breath. DX code 493.20 120 mL 3  . amLODipine (NORVASC) 5 MG tablet Take 1 tablet (5 mg total) by mouth daily. 30 tablet 3  . arformoterol (BROVANA) 15 MCG/2ML NEBU Take 2 mLs (15 mcg total) by nebulization 2 (two) times daily. DX code 493.20    . aspirin 81 MG tablet Take 81 mg by mouth daily.    [name] atorvastatin (LIPITOR) 40 MG tablet Take 40 mg by mouth daily.    . budesonide (PULMICORT) 0.25 MG/2ML nebulizer solution Take 0.25 mg by nebulization 2 (two) times daily.    . Cholecalciferol (VITAMIN D) 2000 UNITS tablet Take 2,000 Units by mouth daily.    . fluticasone (FLONASE) 50 MCG/ACT nasal spray Place 1 spray into both nostrils daily. 16 g 3  . isosorbide mononitrate (IMDUR) 30 MG 24 hr tablet Take 30 mg by mouth daily.    [name] tiotropium (SPIRIVA) 18 MCG inhalation capsule Place 1 capsule (18 mcg total) into inhaler and inhale daily. 30 capsule 11   No current facility-administered medications on file prior to visit.

## 2014-01-08 ENCOUNTER — Encounter (HOSPITAL_COMMUNITY): Payer: Self-pay | Admitting: Internal Medicine

## 2014-01-08 ENCOUNTER — Ambulatory Visit: Payer: Medicare Other | Admitting: Adult Health

## 2014-01-16 ENCOUNTER — Encounter: Payer: Self-pay | Admitting: Adult Health

## 2014-01-16 ENCOUNTER — Ambulatory Visit (INDEPENDENT_AMBULATORY_CARE_PROVIDER_SITE_OTHER): Payer: Medicare Other | Admitting: Adult Health

## 2014-01-16 VITALS — BP 162/82 | HR 58 | Temp 98.3°F | Ht 69.5 in | Wt 231.0 lb

## 2014-01-16 DIAGNOSIS — J432 Centrilobular emphysema: Secondary | ICD-10-CM

## 2014-01-16 MED ORDER — AMOXICILLIN-POT CLAVULANATE 875-125 MG PO TABS: 1.0000 | ORAL_TABLET | Freq: Two times a day (BID) | ORAL | Status: AC

## 2014-01-16 MED ORDER — PREDNISONE 10 MG PO TABS: ORAL_TABLET | ORAL | Status: DC

## 2014-01-16 MED ORDER — HYDROCODONE-HOMATROPINE 5-1.5 MG/5ML PO SYRP: 5.0000 mL | ORAL_SOLUTION | Freq: Four times a day (QID) | ORAL | Status: DC | PRN

## 2014-01-16 NOTE — Patient Instructions (Addendum)
Augmentin 875mg  Twice daily  For 7 days , take with food  Prednisone taper over next week.  Mucinex DM Twice daily  As needed  Cough/congestion  Hydromet 1 tsp At bedtime  As needed  Cough, may make you sleepy.  Follow up Dr. Lake Bells in 3-4 weeks and As needed   Please contact office for sooner follow up if symptoms do not improve or worsen or seek emergency care

## 2014-01-19 NOTE — Assessment & Plan Note (Signed)
Exacerbation   Plan  Augmentin 875mg  Twice daily  For 7 days , take with food  Prednisone taper over next week.  Mucinex DM Twice daily  As needed  Cough/congestion  Hydromet 1 tsp At bedtime  As needed  Cough, may make you sleepy.  Follow up Dr. Lake Bells in 3-4 weeks and As needed   Please contact office for sooner follow up if symptoms do not improve or worsen or seek emergency care

## 2014-01-19 NOTE — Progress Notes (Signed)
Subjective:    Patient ID: Donald Juarez, male    DOB: 1945/09/12, 68 y.o.   MRN: [id number]  Synopsis: Donald Juarez first saw the [facility] clinic in 2014 for COPD after smoking 1 PPD for 30 years and quitting in 2010.  He has frequent exacerbations.    03/2013 Full PFT> Ratio 53% FEV1 1.89 (65% pred, 13% change), TLC 5.83 L (93% pred), DLCO 16.6 (69% pred)  HPI  Chief Complaint  Patient presents with  . Follow-up    pt c/o sob, prod cough with clear mucus.  Was better after taking prednisone, has worsened since coming off prednisone.     12/17/2013 [name] > Donald Juarez says that he continue to struggle with worsening dyspnea and cough with mucus production which is worse when he lies flat at night.  He has coughs out clear mucus mostly at night.  He recently had a humidfier installed 3 weeks ago.  He sleeps on the on the couch to be near his wife.  He notes more dyspnea when he is cooking.  He has tried wearing a mask when he gets outside to cut the grass, etc.  He notes that his dyspnea increased years ago as they wer stepping up his metoprolol for Afib.  He had water damage in his house years ago but this was remedied.  No animals in the house.    01/16/14 Acute OV (COPD )  Pt presents for an acute office visit.  Complains of cough, congestion , sinus drainage, wheezing and dyspnea for 1 week .  Coughing up thick mucus.  Cough is wose at night.  No fever, chest pain, orthopnea, edema or hemoptysis.  Taking mucinex without much help  Remains on Brovana/budesonide Neb and Spiriva.     Review of Systems  Constitutional:   No  weight loss, night sweats,  Fevers, chills,  +fatigue, or  lassitude.  HEENT:   No headaches,  Difficulty swallowing,  Tooth/dental problems, or  Sore throat,                No sneezing, itching, ear ache, + nasal congestion, post nasal drip,   CV:  No chest pain,  Orthopnea, PND, swelling in lower extremities, anasarca, dizziness, palpitations,  syncope.   GI  No heartburn, indigestion, abdominal pain, nausea, vomiting, diarrhea, change in bowel habits, loss of appetite, bloody stools.   Resp:  No chest wall deformity  Skin: no rash or lesions.  GU: no dysuria, change in color of urine, no urgency or frequency.  No flank pain, no hematuria   MS:  No joint pain or swelling.  No decreased range of motion.  No back pain.  Psych:  No change in mood or affect. No depression or anxiety.  No memory loss.          Objective:   Physical Exam GEN: A/Ox3; pleasant , NAD, elderly   HEENT:  [place]/AT,  EACs-clear, TMs-wnl, NOSE-clear, THROAT-clear, no lesions, no postnasal drip or exudate noted.   NECK:  Supple w/ fair ROM; no JVD; normal carotid impulses w/o bruits; no thyromegaly or nodules palpated; no lymphadenopathy.  RESP Decreased BS in bases no accessory muscle use, no dullness to percussion  CARD:  RRR, no m/r/g  , no peripheral edema, pulses intact, no cyanosis or clubbing.  GI:   Soft & nt; nml bowel sounds; no organomegaly or masses detected.  Musco: Warm bil, no deformities or joint swelling noted.   Neuro: alert, no focal  deficits noted.    Skin: Warm, no lesions or rashes        Assessment & Plan:

## 2014-02-02 ENCOUNTER — Encounter (HOSPITAL_BASED_OUTPATIENT_CLINIC_OR_DEPARTMENT_OTHER): Payer: Medicare Other

## 2014-02-06 ENCOUNTER — Encounter: Payer: Self-pay | Admitting: Pulmonary Disease

## 2014-02-06 ENCOUNTER — Ambulatory Visit (INDEPENDENT_AMBULATORY_CARE_PROVIDER_SITE_OTHER): Payer: Medicare Other | Admitting: Pulmonary Disease

## 2014-02-06 VITALS — BP 148/80 | HR 93 | Temp 98.0°F | Ht 69.0 in | Wt 225.0 lb

## 2014-02-06 DIAGNOSIS — J432 Centrilobular emphysema: Secondary | ICD-10-CM

## 2014-02-06 MED ORDER — PREDNISONE 10 MG PO TABS: ORAL_TABLET | ORAL | Status: DC

## 2014-02-06 NOTE — Progress Notes (Signed)
Subjective:    Patient ID: Donald Juarez, male    DOB: 06-16-1945, 69 y.o.   MRN: [id number]  Synopsis: Donald Juarez first saw the [facility] in 2014 for COPD after smoking 1 PPD for 30 years and quitting in 2010.  He has frequent exacerbations.    03/2013 Full PFT> Ratio 53% FEV1 1.89 (65% pred, 13% change), TLC 5.83 L (93% pred), DLCO 16.6 (69% pred)  HPI  Chief Complaint  Patient presents with  . Follow-up    Pt was doing well until walking through [employer] last Wednesday, started having nonprod cough, sob, has been since.     02/06/2013 [name] > Donald Juarez was seen by my partner last month and took an antibiotic which helps.  But for the last few days he is wheezing more and he is more short of breath.  He has been coughing up more mucus.  He is just taking his home medications.  No leg swelling, no chest pain. He has not had fevers or chills. The mucus is white in color.   Past Medical History  Diagnosis Date  . COPD (chronic obstructive pulmonary disease)   . Carotid stenosis     a. 07/2013 Carotid U/S: 100% RICA (pt prev reported h/o 07%), LICA 62-26%, bilat ECA 50%, normal vertebrals and subclavians bilat-->F/U needed in 01/2014.  [name] Rectal cancer     a. Status post colostomy  . Hypertension   . Paroxysmal atrial flutter     a. CHA2DS2VASc = 3 - on eliquis;  b. 07/2013 Echo: EF 50-55%, mildly dil LA.c. s/p ablation 10/31/2013 by Dr Caryl Comes  . Syncope and collapse   . Coronary artery disease     a. 06/1996 & 03/2005 Cath: non-obs dzs;  b. 07/2009 NSTEMI/Cath: LCX 99->PCI/DES extending into OM1;  c. 01/2013 Cath: LM nl, LAD 30p, 70m, LCX 30ost, 20p ISR, 5m, OM1 patent stent, OM2 30/small, RCA 20/40p, 50m, RPL 75, EF 55%-->Med Rx.  [name] Hyperlipidemia   . Cataract, right     a. 10/2010 s/p cataract surgery   . Shingles     a. 09/2012.  . Pulmonary nodule, right     a. 0.7cm RLL - stable by CT 06/2013.  . Bilateral claudication of lower limb   . SSS (sick sinus  syndrome)     a. 12/2009 S/P MDT PPM ([name])  . Hyperkalemia      Review of Systems  Constitutional: Positive for fatigue. Negative for fever and chills.  HENT: Negative for postnasal drip, rhinorrhea and sinus pressure.   Respiratory: Positive for cough, shortness of breath and wheezing.   Cardiovascular: Negative for chest pain, palpitations and leg swelling.       Objective:   Physical Exam  Filed Vitals:   02/06/14 1055  BP: 148/80  Pulse: 93  Temp: 98 F (36.7 C)  TempSrc: Oral  Height: 5\' 9"  (1.753 m)  Weight: 225 lb (102.059 kg)  SpO2: 96%    RA  Gen: well appearing, no acute distress HEENT: NCAT, EOMi, OP clear PULM: Wheezing in upper lobes, rhonchi noted, decreased air movement CV: RRR, no mgr, no JVD AB: BS+, soft, nontender Ext: warm, no edema, no clubbing, no cyanosis Derm: bruising over extensor surfaces of forearms       Assessment & Plan:   COPD, severe GOLD GRADE D Donald Juarez has very severe COPD with recurrent exacerbations which pretends a poor prognosis. We have been unable to use daily azithromycin in the past  because of recurrent arrhythmia problems. Because of his elevated eosinophil count on previous lab draws he would be a very reasonable candidate for the clinical trial for a biologic agent created for exactly his phenotype.  Unfortunately today he is suffering from another exacerbation. There is no clear environmental trigger or change that we have been able to identify 3 history or lab work.  Plan: -Treat with a prednisone taper again -Continue inhaled therapies  -continue plan for follow-up with pulmonary for evaluation for clinical trial -If he is unable to be enrolled in a clinical trial then we will Roflumilast -If no improvement with Roflumilast then attempt rotating antibiotics apart from azithromycin    Updated Medication List Outpatient Encounter Prescriptions as of 02/06/2014  Medication Sig  . albuterol (PROVENTIL) (2.5  MG/3ML) 0.083% nebulizer solution Take 3 mLs (2.5 mg total) by nebulization every 6 (six) hours as needed for wheezing or shortness of breath. DX code 493.20  . amLODipine (NORVASC) 5 MG tablet Take 1 tablet (5 mg total) by mouth daily.  [name] arformoterol (BROVANA) 15 MCG/2ML NEBU Take 2 mLs (15 mcg total) by nebulization 2 (two) times daily. DX code 493.20  . aspirin 81 MG tablet Take 81 mg by mouth daily.  [name] atorvastatin (LIPITOR) 40 MG tablet Take 40 mg by mouth daily.  . budesonide (PULMICORT) 0.25 MG/2ML nebulizer solution Take 0.25 mg by nebulization 2 (two) times daily.  . Cholecalciferol (VITAMIN D) 2000 UNITS tablet Take 2,000 Units by mouth daily.  . fluticasone (FLONASE) 50 MCG/ACT nasal spray Place 1 spray into both nostrils daily.  . isosorbide mononitrate (IMDUR) 30 MG 24 hr tablet Take 30 mg by mouth daily.  [name] tiotropium (SPIRIVA) 18 MCG inhalation capsule Place 1 capsule (18 mcg total) into inhaler and inhale daily.  . predniSONE (DELTASONE) 10 MG tablet Take 40mg  po daily for 3 days, then take 30mg  po daily for 3 days, then take 20mg  po daily for two days, then take 10mg  po daily for 2 days  . [DISCONTINUED] HYDROcodone-homatropine (HYDROMET) 5-1.5 MG/5ML syrup Take 5 mLs by mouth every 6 (six) hours as needed. (Patient not taking: Reported on 02/06/2014)  . [DISCONTINUED] levofloxacin (LEVAQUIN) 500 MG tablet Take 1 tablet (500 mg total) by mouth daily.  . [DISCONTINUED] predniSONE (DELTASONE) 10 MG tablet 4 tabs for 2 days, then 3 tabs for 2 days, 2 tabs for 2 days, then 1 tab for 2 days, then stop (Patient not taking: Reported on 02/06/2014)

## 2014-02-06 NOTE — Assessment & Plan Note (Signed)
Donald Juarez has very severe COPD with recurrent exacerbations which pretends a poor prognosis. We have been unable to use daily azithromycin in the past because of recurrent arrhythmia problems. Because of his elevated eosinophil count on previous lab draws he would be a very reasonable candidate for the clinical trial for a biologic agent created for exactly his phenotype.  Unfortunately today he is suffering from another exacerbation. There is no clear environmental trigger or change that we have been able to identify 3 history or lab work.  Plan: -Treat with a prednisone taper again -Continue inhaled therapies  -continue plan for follow-up with pulmonary for evaluation for clinical trial -If he is unable to be enrolled in a clinical trial then we will Roflumilast -If no improvement with Roflumilast then attempt rotating antibiotics apart from azithromycin

## 2014-02-06 NOTE — Patient Instructions (Signed)
Take the prednisone taper Take mucinex D twice a day Take your inhalers regularly as you are doing and the nebulizers We will see you back in 6-8 weeks or sooner if needed

## 2014-02-12 ENCOUNTER — Encounter (HOSPITAL_COMMUNITY): Payer: Self-pay | Admitting: Internal Medicine

## 2014-02-27 ENCOUNTER — Telehealth: Payer: Self-pay | Admitting: Internal Medicine

## 2014-02-27 NOTE — Telephone Encounter (Signed)
Pt called asking to see to [facility], stated that he does not have any opening until mid march. But he is  having problems such as dizziness Chest pains- not bad but comes and goes And indigestion Not a lot energy this happen several years ago and it did not end well

## 2014-02-27 NOTE — Telephone Encounter (Signed)
Pt states that he would like to be seen as soon possible.  Stated to patient that we would ask dr Caryl Comes when can he see him Patient then said to call him to let him know if we can see him or not.

## 2014-02-27 NOTE — Telephone Encounter (Signed)
Spoke w/ pt.  He reports indigestion-like discomfort, but states that this in not an emergency and he will not go to the ED.  He asks for an appt to see Dr. Caryl Comes soon.  Advised him that Dr. Caryl Comes does not have any openings until March, but I can see if the girls up front can accomodate him. He asks for an appt for [facility].  Transferred him to scheduling.

## 2014-03-02 ENCOUNTER — Other Ambulatory Visit: Payer: Self-pay | Admitting: Physician Assistant

## 2014-03-02 ENCOUNTER — Telehealth: Payer: Self-pay

## 2014-03-02 HISTORY — PX: CARDIAC CATHETERIZATION: SHX172

## 2014-03-02 NOTE — Telephone Encounter (Signed)
-----   Message from [name], MD sent at 02/27/2014  5:39 PM EST ----- We can see him on 2/9 either at 8 or 1145  Chest pain though is a concern that should be evaluated in the ER

## 2014-03-02 NOTE — Telephone Encounter (Signed)
Pt called back, made appt for 2-9 11:45. Informed pt if he experienced CP anymore before his appt to go to ER. Pt verbally understood.

## 2014-03-02 NOTE — Telephone Encounter (Signed)
l message with wife for pt to call and schedule appt for 03-10-14

## 2014-03-06 ENCOUNTER — Encounter: Payer: Self-pay | Admitting: Internal Medicine

## 2014-03-10 ENCOUNTER — Telehealth: Payer: Self-pay | Admitting: Pulmonary Disease

## 2014-03-10 ENCOUNTER — Ambulatory Visit (INDEPENDENT_AMBULATORY_CARE_PROVIDER_SITE_OTHER): Payer: Medicare Other | Admitting: Internal Medicine

## 2014-03-10 ENCOUNTER — Encounter: Payer: Self-pay | Admitting: Internal Medicine

## 2014-03-10 ENCOUNTER — Ambulatory Visit: Payer: Self-pay | Admitting: Internal Medicine

## 2014-03-10 VITALS — BP 130/74 | HR 81 | Ht 69.0 in | Wt 223.8 lb

## 2014-03-10 DIAGNOSIS — I6523 Occlusion and stenosis of bilateral carotid arteries: Secondary | ICD-10-CM

## 2014-03-10 DIAGNOSIS — I4892 Unspecified atrial flutter: Secondary | ICD-10-CM

## 2014-03-10 DIAGNOSIS — I495 Sick sinus syndrome: Secondary | ICD-10-CM

## 2014-03-10 DIAGNOSIS — Z950 Presence of cardiac pacemaker: Secondary | ICD-10-CM

## 2014-03-10 DIAGNOSIS — R079 Chest pain, unspecified: Secondary | ICD-10-CM

## 2014-03-10 LAB — LAB REPORT - SCANNED: INR: 1

## 2014-03-10 MED ORDER — PREDNISONE 10 MG PO TABS: ORAL_TABLET | ORAL | Status: DC

## 2014-03-10 MED ORDER — NEBIVOLOL HCL 2.5 MG PO TABS: 2.5000 mg | ORAL_TABLET | Freq: Every day | ORAL | Status: DC

## 2014-03-10 NOTE — Telephone Encounter (Signed)
Spoke with pt, c/o worsening SOB with any exertion since yesterday, some prod cough with clear mucus, pnd, no fever.  Is requesting a prednisone rx to be sent to [facility].    BQ please advise.  Thank you.

## 2014-03-10 NOTE — Progress Notes (Signed)
Patient Care Team: Sofie Hartigan, MD as PCP - General (Family Medicine)   HPI  Donald Juarez is a 69 y.o. male Seen in followup for atrial flutter. He was scheduled for catheter ablation on 9/11. Unfortunately, he came in having failed to take his prior 24 hours of apixaban. The patient procedure was undertaken 10/15. He has prior MDT pacemaker insertion for sinus node dysfunction   He continues to complain of exercise intolerance. He recalls similar symptoms prior to his stenting in 2012. He got much better for a few days post ablation and has good days and bad days; He is complaining of exertional chest tightness that is reproducible and predictable, crit by shortness of breath. He is also having nocturnal jaw discomfort which is reminiscent of his pre-stent symptoms.  He is also having shortness of breath and wheezing which is described to his COPD; it is his impression that these are distinct from his chest pain syndrome.   e is seen pulmonary; he has had no peripheral edema.   CAD with prior stenting of the LCX/OM1 with patent stents on cath 01/2013.   Echo was performed, which showed nl LV fxn and mild LAE. Pt also reported a h/o right sided carotid dzs and had not had it evaluated in some time. A carotid U/S was performed 7/15 and revealed an occluded RICA with 50-27% LICA stenosis and 74% bilat ECA stenosis.     Past Medical History  Diagnosis Date  . COPD (chronic obstructive pulmonary disease)   . Carotid stenosis     a. 07/2013 Carotid U/S: 100% RICA (pt prev reported h/o 12%), LICA 87-86%, bilat ECA 50%, normal vertebrals and subclavians bilat-->F/U needed in 01/2014.  [name] Rectal cancer     a. Status post colostomy  . Hypertension   . Paroxysmal atrial flutter     a. CHA2DS2VASc = 3 - on eliquis;  b. 07/2013 Echo: EF 50-55%, mildly dil LA.c. s/p ablation 10/31/2013 by Dr Caryl Comes  . Syncope and collapse   . Coronary artery disease     a. 06/1996 & 03/2005 Cath: non-obs  dzs;  b. 07/2009 NSTEMI/Cath: LCX 99->PCI/DES extending into OM1;  c. 01/2013 Cath: LM nl, LAD 30p, 65m, LCX 30ost, 20p ISR, 14m, OM1 patent stent, OM2 30/small, RCA 20/40p, 86m, RPL 75, EF 55%-->Med Rx.  [name] Hyperlipidemia   . Cataract, right     a. 10/2010 s/p cataract surgery   . Shingles     a. 09/2012.  . Pulmonary nodule, right     a. 0.7cm RLL - stable by CT 06/2013.  . Bilateral claudication of lower limb   . SSS (sick sinus syndrome)     a. 12/2009 S/P MDT PPM ([name])  . Hyperkalemia     Past Surgical History  Procedure Laterality Date  . Cardiac surgery  2011    Stents placed   . Cataract surgery  2012  . Colon surgery      cancer removal  . Knee arthroscopy Right 1980's  . Cardiac catheterization  07/2009    [facility]  . Cardiac catheterization  01/2013    [facility]  . Insert / replace / remove pacemaker    . Ablation  10/31/2013    CTI ablation by Dr Caryl Comes for atrial flutter  . Atrial flutter ablation N/A 10/31/2013    Procedure: ATRIAL FLUTTER ABLATION;  Surgeon: [name], MD;  Location: [facility] CATH LAB;  Service: Cardiovascular;  Laterality: N/A;  Current Outpatient Prescriptions  Medication Sig Dispense Refill  . albuterol (PROVENTIL) (2.5 MG/3ML) 0.083% nebulizer solution Take 3 mLs (2.5 mg total) by nebulization every 6 (six) hours as needed for wheezing or shortness of breath. DX code 493.20 120 mL 3  . amLODipine (NORVASC) 5 MG tablet TAKE ONE (1) TABLET EACH DAY 30 tablet 6  . arformoterol (BROVANA) 15 MCG/2ML NEBU Take 2 mLs (15 mcg total) by nebulization 2 (two) times daily. DX code 493.20    . aspirin 81 MG tablet Take 81 mg by mouth daily.    [name] atorvastatin (LIPITOR) 40 MG tablet Take 40 mg by mouth daily.    . budesonide (PULMICORT) 0.25 MG/2ML nebulizer solution Take 0.25 mg by nebulization 2 (two) times daily.    . Cholecalciferol (VITAMIN D) 2000 UNITS tablet Take 2,000 Units by mouth daily.    . fluticasone (FLONASE) 50 MCG/ACT nasal spray Place 1 spray into  both nostrils daily. 16 g 3  . isosorbide mononitrate (IMDUR) 30 MG 24 hr tablet Take 30 mg by mouth daily.    . predniSONE (DELTASONE) 10 MG tablet Take 40mg  po daily for 3 days, then take 30mg  po daily for 3 days, then take 20mg  po daily for two days, then take 10mg  po daily for 2 days 27 tablet 0  . tiotropium (SPIRIVA) 18 MCG inhalation capsule Place 1 capsule (18 mcg total) into inhaler and inhale daily. 30 capsule 11   No current facility-administered medications for this visit.    No Known Allergies  Review of Systems negative except from HPI and PMH  Physical Exam BP 130/74 mmHg  Pulse 81  Ht 5\' 9"  (1.753 m)  Wt 223 lb 12 oz (101.492 kg)  BMI 33.03 kg/m2 Well developed and well nourished in no acute distress HENT normal E scleral and icterus clear Neck Supple JVP flat; carotids brisk and full Clear to ausculation Fast and irregular  rate and rhythm, no murmurs gallops or rub Soft with active bowel sounds No clubbing cyanosis Trace Edema Alert and oriented, grossly normal motor and sensory function Skin Warm and Dry  ECG demonstrates atrially paced rhythm with frequent PVCs  Assessment and  Plan  Atrial flutter-s/p ablation  Sinus node dysfunction  PVCs   Hypertension  Carotid stenosis  Coronary disease with prior stenting  Increasing chest pain   HFpEF  COPD He has had no recurrent atrial flutter.  PVC burden is about 10%; this may require further attention.  His sinus node dysfunction had been aggravated by his beta blockers, we held his bisoprolol; there has been a flare and exertional chest discomfort. This may be related to the weaning of his beta blocker; we will introduce it again at half-strength. 2.5 mg   Given the recurrence of exertional chest discomfort reproducible and analogous to his prior angina, we will undertake catheterization.   His carotid stenosis is stable; however, he is approaching a threshold at which point endarterectomy would  be appropriately considered. We will plan to recheck his carotid Dopplers at next visit.

## 2014-03-10 NOTE — Telephone Encounter (Signed)
OK by me: Take 40mg  po daily for 3 days, then take 30mg  po daily for 3 days, then take 20mg  po daily for two days, then take 10mg  po daily for 2 days

## 2014-03-10 NOTE — Patient Instructions (Addendum)
[facility] Cardiac Cath Instructions   You are scheduled for a Cardiac Cath on:__2/11/16_______________________  Please arrive at _______am on the day of your procedure  You will need to pre-register prior to the day of your procedure.  Enter through the [employer] at [facility].  Registration is the first desk on your right.  Please take the procedure order we have given you in order to be registered appropriately  Do not eat/drink anything after midnight  Someone will need to drive you home  It is recommended someone be with you for the first 24 hours after your procedure  Wear clothes that are easy to get on/off and wear slip on shoes if possible   Medications bring a current list of all medications with you  _x__ You may take all of your medications the morning of your procedure with enough water to swallow safely    Day of your procedure: Arrive at the [facility] entrance.  Free valet service is available.  After entering the [facility] please check-in at the registration desk (1st desk on your right) to receive your armband. After receiving your armband someone will escort you to the cardiac cath/special procedures waiting area.  The usual length of stay after your procedure is about 2 to 3 hours.  This can vary.  If you have any questions, please call our office at [phone], or you may call the cardiac cath lab at [facility] directly at [phone]  Your physician recommends that you have labs today:  CBC  BMP  INR  Your physician has recommended you make the following change in your medication:  Start Bystolic 2.5 mg once daily

## 2014-03-10 NOTE — Telephone Encounter (Signed)
Pt aware of recs.  Med sent to pharmacy.  Nothing further needed.

## 2014-03-11 ENCOUNTER — Other Ambulatory Visit: Payer: Self-pay

## 2014-03-11 ENCOUNTER — Telehealth: Payer: Self-pay | Admitting: *Deleted

## 2014-03-11 DIAGNOSIS — R079 Chest pain, unspecified: Secondary | ICD-10-CM

## 2014-03-11 NOTE — Telephone Encounter (Signed)
Cath orders faxed to cath lab  [facility] aware  Dr. Fletcher Anon to take completed orders with him to cath in the am  Dr. Fletcher Anon is aware

## 2014-03-11 NOTE — Telephone Encounter (Signed)
Informed patient his cath is scheduled for 1030  He will need to arrive ar 0930  Patient verbalized understanding

## 2014-03-12 ENCOUNTER — Ambulatory Visit: Payer: Self-pay | Admitting: Cardiovascular Disease

## 2014-03-12 DIAGNOSIS — I25118 Atherosclerotic heart disease of native coronary artery with other forms of angina pectoris: Secondary | ICD-10-CM

## 2014-03-12 LAB — MDC_IDC_ENUM_SESS_TYPE_INCLINIC
Battery Impedance: 319 Ohm
Battery Remaining Longevity: 91 mo
Brady Statistic AP VP Percent: 4 %
Brady Statistic AP VS Percent: 33 %
Brady Statistic AS VS Percent: 60 %
Date Time Interrogation Session: 20160209171227
Lead Channel Impedance Value: 627 Ohm
Lead Channel Pacing Threshold Amplitude: 0.5 V
Lead Channel Pacing Threshold Pulse Width: 0.4 ms
Lead Channel Pacing Threshold Pulse Width: 1.5 ms
Lead Channel Sensing Intrinsic Amplitude: 8 mV
Lead Channel Setting Pacing Amplitude: 2 V
Lead Channel Setting Sensing Sensitivity: 2.8 mV
MDC IDC MSMT BATTERY VOLTAGE: 2.79 V
MDC IDC MSMT LEADCHNL RA IMPEDANCE VALUE: 470 Ohm
MDC IDC MSMT LEADCHNL RA SENSING INTR AMPL: 1.4 mV
MDC IDC MSMT LEADCHNL RV PACING THRESHOLD AMPLITUDE: 0.5 V
MDC IDC SET LEADCHNL RV PACING AMPLITUDE: 2 V
MDC IDC SET LEADCHNL RV PACING PULSEWIDTH: 0.4 ms
MDC IDC STAT BRADY AS VP PERCENT: 3 %

## 2014-03-13 ENCOUNTER — Telehealth: Payer: Self-pay | Admitting: *Deleted

## 2014-03-13 MED ORDER — NEBIVOLOL HCL 5 MG PO TABS: 5.0000 mg | ORAL_TABLET | Freq: Every day | ORAL | Status: DC

## 2014-03-13 NOTE — Telephone Encounter (Signed)
Pt calling stating he had cath yesterday, and they showed no blockage.   Now he's asking what should he do in the mean time. Since his apt in 13 days. What should he do in the mean time.   Symptoms are still there and placed pt on medication until then, he just needs to know is this going to help.  Pt seem a bit persistent.  Please advise

## 2014-03-13 NOTE — Telephone Encounter (Signed)
Informed patient that Dr Fletcher Anon wants him to increase Bystolic 5 mg once daily  Patient verbalized understanding

## 2014-03-20 ENCOUNTER — Encounter: Payer: Self-pay | Admitting: Pulmonary Disease

## 2014-03-20 ENCOUNTER — Ambulatory Visit (INDEPENDENT_AMBULATORY_CARE_PROVIDER_SITE_OTHER): Payer: Medicare Other | Admitting: Pulmonary Disease

## 2014-03-20 VITALS — BP 128/68 | HR 72 | Ht 69.0 in | Wt 227.0 lb

## 2014-03-20 DIAGNOSIS — J432 Centrilobular emphysema: Secondary | ICD-10-CM

## 2014-03-20 MED ORDER — PREDNISONE 5 MG PO TABS: ORAL_TABLET | ORAL | Status: DC

## 2014-03-20 MED ORDER — NITROGLYCERIN 0.3 MG SL SUBL: 0.3000 mg | SUBLINGUAL_TABLET | SUBLINGUAL | Status: DC | PRN

## 2014-03-20 MED ORDER — ROFLUMILAST 500 MCG PO TABS: 500.0000 ug | ORAL_TABLET | Freq: Every day | ORAL | Status: DC

## 2014-03-20 NOTE — Assessment & Plan Note (Signed)
Armond has struggled recently with multiple recurrent exacerbations of COPD. Unfortunately, he was unable to be enrolled in the clinical trial for the biologic agent in patients with eosinophilic COPD. That trial, it should be noted, has been discontinued from our site because of difficulty with enrollment.  Today we talked about various options for trying to prevent these recurrent exacerbations of COPD. We have been unable to identify an allergen in his environment which is causing this, though with the high eosinophils it does appear that this is an allergic process. His blood testing was not suggestive of ABPA, nor was his CT chest.  He would not be a candidate for daily azithromycin considering his cardiac arrhythmia issues.   Plan: -6 week prednisone taper -Start Roflumilast -Continue inhaled medications -If he cannot afford Roflumilast, then we will apply for financial assistance, if we are unable to get financial assistance then we can consider rotating antibiotics -Follow-up 3 months

## 2014-03-20 NOTE — Progress Notes (Signed)
Subjective:    Patient ID: Donald Juarez, male    DOB: 1945-08-02, 69 y.o.   MRN: [id number]  Synopsis: Donald Juarez first saw the [facility] clinic in 2014 for COPD after smoking 1 PPD for 30 years and quitting in 2010.  He has frequent exacerbations.    03/2013 Full PFT> Ratio 53% FEV1 1.89 (65% pred, 13% change), TLC 5.83 L (93% pred), DLCO 16.6 (69% pred)  HPI Chief Complaint  Patient presents with  . Follow-up    Pt was recently given pred taper, states he lost pred taper.  his breathing is the same as last visit: prod cough with clear mucus, sob with exertion.    Donald Juarez lost his prescription to his prednisone.  He started to feel better after three days of the prdnisone and was feeling great but then he started feeling bad again with more dyspnea since the 15th. He is coughing up clear mucus. He said that the mucinex made his heart race.  It sounds like he was taking the kind with pseudophed.  He has noted decreased urinary frequency while taking the pseudophed.  He said that his problems have been bad since September of 14 when he had Shingles.  He has been making efforts to avoid dust, fumes, smoke, etc by wearing a mask, changing how he prepares food, etc.    Past Medical History  Diagnosis Date  . COPD (chronic obstructive pulmonary disease)   . Carotid stenosis     a. 07/2013 Carotid U/S: 100% RICA (pt prev reported h/o 00%), LICA 86-76%, bilat ECA 50%, normal vertebrals and subclavians bilat-->F/U needed in 01/2014.  [name] Rectal cancer     a. Status post colostomy  . Hypertension   . Paroxysmal atrial flutter     a. CHA2DS2VASc = 3 - on eliquis;  b. 07/2013 Echo: EF 50-55%, mildly dil LA.c. s/p ablation 10/31/2013 by Dr Caryl Comes  . Syncope and collapse   . Coronary artery disease     a. 06/1996 & 03/2005 Cath: non-obs dzs;  b. 07/2009 NSTEMI/Cath: LCX 99->PCI/DES extending into OM1;  c. 01/2013 Cath: LM nl, LAD 30p, 76m, LCX 30ost, 20p ISR, 45m, OM1 patent stent, OM2  30/small, RCA 20/40p, 81m, RPL 75, EF 55%-->Med Rx.  [name] Hyperlipidemia   . Cataract, right     a. 10/2010 s/p cataract surgery   . Shingles     a. 09/2012.  . Pulmonary nodule, right     a. 0.7cm RLL - stable by CT 06/2013.  . Bilateral claudication of lower limb   . SSS (sick sinus syndrome)     a. 12/2009 S/P MDT PPM ([name])  . Hyperkalemia      Review of Systems  Constitutional: Positive for fatigue. Negative for fever and chills.  HENT: Negative for postnasal drip, rhinorrhea and sinus pressure.   Respiratory: Positive for cough, shortness of breath and wheezing.   Cardiovascular: Negative for chest pain, palpitations and leg swelling.       Objective:   Physical Exam Filed Vitals:   03/20/14 1115  BP: 128/68  Pulse: 72  Height: 5\' 9"  (1.753 m)  Weight: 227 lb (102.967 kg)  SpO2: 96%    RA  Gen: well appearing, no acute distress HEENT: NCAT, EOMi, OP clear PULM: Wheezes bilaterally CV: RRR, no mgr, no JVD AB: BS+, soft, nontender Ext: warm, no edema, no clubbing, no cyanosis Derm: bruising over extensor surfaces of forearms      Assessment & Plan:  COPD, severe GOLD GRADE D Donald Juarez has struggled recently with multiple recurrent exacerbations of COPD. Unfortunately, he was unable to be enrolled in the clinical trial for the biologic agent in patients with eosinophilic COPD. That trial, it should be noted, has been discontinued from our site because of difficulty with enrollment.  Today we talked about various options for trying to prevent these recurrent exacerbations of COPD. We have been unable to identify an allergen in his environment which is causing this, though with the high eosinophils it does appear that this is an allergic process. His blood testing was not suggestive of ABPA, nor was his CT chest.  He would not be a candidate for daily azithromycin considering his cardiac arrhythmia issues.   Plan: -6 week prednisone taper -Start  Roflumilast -Continue inhaled medications -If he cannot afford Roflumilast, then we will apply for financial assistance, if we are unable to get financial assistance then we can consider rotating antibiotics -Follow-up 3 months     Updated Medication List Outpatient Encounter Prescriptions as of 03/20/2014  Medication Sig  . albuterol (PROVENTIL) (2.5 MG/3ML) 0.083% nebulizer solution Take 3 mLs (2.5 mg total) by nebulization every 6 (six) hours as needed for wheezing or shortness of breath. DX code 493.20  . amLODipine (NORVASC) 5 MG tablet TAKE ONE (1) TABLET EACH DAY  . arformoterol (BROVANA) 15 MCG/2ML NEBU Take 2 mLs (15 mcg total) by nebulization 2 (two) times daily. DX code 493.20  . aspirin 81 MG tablet Take 81 mg by mouth daily.  [name] atorvastatin (LIPITOR) 40 MG tablet Take 40 mg by mouth daily.  . budesonide (PULMICORT) 0.25 MG/2ML nebulizer solution Take 0.25 mg by nebulization 2 (two) times daily.  . Cholecalciferol (VITAMIN D) 2000 UNITS tablet Take 2,000 Units by mouth daily.  . fluticasone (FLONASE) 50 MCG/ACT nasal spray Place 1 spray into both nostrils daily.  . isosorbide mononitrate (IMDUR) 30 MG 24 hr tablet Take 30 mg by mouth daily.  . nebivolol (BYSTOLIC) 5 MG tablet Take 1 tablet (5 mg total) by mouth daily.  [name] tiotropium (SPIRIVA) 18 MCG inhalation capsule Place 1 capsule (18 mcg total) into inhaler and inhale daily.  . nitroGLYCERIN (NITROSTAT) 0.3 MG SL tablet Place 1 tablet (0.3 mg total) under the tongue every 5 (five) minutes as needed for chest pain.  . predniSONE (DELTASONE) 5 MG tablet Take 30mg  daily for two weeks, then take 20mg  daily for 2 weeks, then take 10mg  daily for a week, then take 5 mg daily for a week  . roflumilast (DALIRESP) 500 MCG TABS tablet Take 1 tablet (500 mcg total) by mouth daily.  . [DISCONTINUED] predniSONE (DELTASONE) 10 MG tablet Take 40mg  po daily for 3 days, then take 30mg  po daily for 3 days, then take 20mg  po daily for two days,  then take 10mg  po daily for 2 days (Patient not taking: Reported on 03/20/2014)

## 2014-03-20 NOTE — Patient Instructions (Signed)
Take the prednisone for 6 weeks Take the roflumilast daily no matter how you feel, if it is too expensive we will come up with another option for you We will see you back in 3 months or sooner if needed

## 2014-03-23 ENCOUNTER — Encounter: Payer: Self-pay | Admitting: Cardiovascular Disease

## 2014-03-24 ENCOUNTER — Ambulatory Visit (INDEPENDENT_AMBULATORY_CARE_PROVIDER_SITE_OTHER): Payer: Medicare Other | Admitting: Cardiovascular Disease

## 2014-03-24 ENCOUNTER — Encounter: Payer: Self-pay | Admitting: Cardiovascular Disease

## 2014-03-24 VITALS — BP 168/100 | HR 78 | Ht 69.0 in | Wt 231.5 lb

## 2014-03-24 DIAGNOSIS — I4892 Unspecified atrial flutter: Secondary | ICD-10-CM

## 2014-03-24 DIAGNOSIS — I25118 Atherosclerotic heart disease of native coronary artery with other forms of angina pectoris: Secondary | ICD-10-CM

## 2014-03-24 DIAGNOSIS — I10 Essential (primary) hypertension: Secondary | ICD-10-CM

## 2014-03-24 DIAGNOSIS — I6523 Occlusion and stenosis of bilateral carotid arteries: Secondary | ICD-10-CM

## 2014-03-24 DIAGNOSIS — R079 Chest pain, unspecified: Secondary | ICD-10-CM

## 2014-03-24 MED ORDER — FUROSEMIDE 20 MG PO TABS: 20.0000 mg | ORAL_TABLET | Freq: Every day | ORAL | Status: DC

## 2014-03-24 MED ORDER — ISOSORBIDE MONONITRATE ER 60 MG PO TB24: 60.0000 mg | ORAL_TABLET | Freq: Every day | ORAL | Status: DC

## 2014-03-24 NOTE — Assessment & Plan Note (Signed)
Blood pressure is elevated. Imdur was increased.

## 2014-03-24 NOTE — Assessment & Plan Note (Signed)
The patient is due for carotid Doppler. He has known occluded right internal carotid artery and 60-79% left internal carotid artery stenosis.

## 2014-03-24 NOTE — Progress Notes (Signed)
HPI  This is a 69 year old male who is here today for a follow-up visit after recent cardiac catheterization. He has known history of coronary artery disease status post stenting of the left circumflex in January 2015, carotid artery stenosis with occlusion on the right side, COPD with previous smoking, atrial flutter status post ablation in October 2015. He is status post pacemaker placement. He was seen recently by Dr. Caryl Comes and reported worsening symptoms of exertional chest tightness. The patient reports chest tightness both at rest and with activities. He does have COPD and was started recently on prednisone due to worsening. I proceeded with cardiac catheterization via the right radial artery which showed patent left circumflex stents. There was 80% stenosis in the ostial right AV groove artery at the bifurcation of the PDA. This was not an optimal location for PCI and supplied a relatively small area. Thus, I felt that the risk of PCI outweighs the benefit. Left ventricular end-diastolic pressure was 22. I increased his dose of Bystolic to 5 mg once daily. He reports no significant improvement.  No Known Allergies   Current Outpatient Prescriptions on File Prior to Visit  Medication Sig Dispense Refill  . albuterol (PROVENTIL) (2.5 MG/3ML) 0.083% nebulizer solution Take 3 mLs (2.5 mg total) by nebulization every 6 (six) hours as needed for wheezing or shortness of breath. DX code 493.20 120 mL 3  . amLODipine (NORVASC) 5 MG tablet TAKE ONE (1) TABLET EACH DAY 30 tablet 6  . arformoterol (BROVANA) 15 MCG/2ML NEBU Take 2 mLs (15 mcg total) by nebulization 2 (two) times daily. DX code 493.20    . aspirin 81 MG tablet Take 81 mg by mouth daily.    [name] atorvastatin (LIPITOR) 40 MG tablet Take 40 mg by mouth daily.    . budesonide (PULMICORT) 0.25 MG/2ML nebulizer solution Take 0.25 mg by nebulization 2 (two) times daily.    . Cholecalciferol (VITAMIN D) 2000 UNITS tablet Take 2,000 Units by mouth  daily.    . fluticasone (FLONASE) 50 MCG/ACT nasal spray Place 1 spray into both nostrils daily. 16 g 3  . nebivolol (BYSTOLIC) 5 MG tablet Take 1 tablet (5 mg total) by mouth daily. 30 tablet 6  . nitroGLYCERIN (NITROSTAT) 0.3 MG SL tablet Place 1 tablet (0.3 mg total) under the tongue every 5 (five) minutes as needed for chest pain. 90 tablet 12  . predniSONE (DELTASONE) 5 MG tablet Take 30mg  daily for two weeks, then take 20mg  daily for 2 weeks, then take 10mg  daily for a week, then take 5 mg daily for a week 170 tablet 0  . roflumilast (DALIRESP) 500 MCG TABS tablet Take 1 tablet (500 mcg total) by mouth daily. 30 tablet 11  . tiotropium (SPIRIVA) 18 MCG inhalation capsule Place 1 capsule (18 mcg total) into inhaler and inhale daily. 30 capsule 11   No current facility-administered medications on file prior to visit.     Past Medical History  Diagnosis Date  . COPD (chronic obstructive pulmonary disease)   . Carotid stenosis     a. 07/2013 Carotid U/S: 100% RICA (pt prev reported h/o 40%), LICA 34-74%, bilat ECA 50%, normal vertebrals and subclavians bilat-->F/U needed in 01/2014.  [name] Rectal cancer     a. Status post colostomy  . Hypertension   . Paroxysmal atrial flutter     a. CHA2DS2VASc = 3 - on eliquis;  b. 07/2013 Echo: EF 50-55%, mildly dil LA.c. s/p ablation 10/31/2013 by Dr Caryl Comes  . Syncope  and collapse   . Coronary artery disease     a. 06/1996 & 03/2005 Cath: non-obs dzs;  b. 07/2009 NSTEMI/Cath: LCX 99->PCI/DES extending into OM1;  c. 01/2013 Cath: LM nl, LAD 30p, 44m, LCX 30ost, 20p ISR, 65m, OM1 patent stent, OM2 30/small, RCA 20/40p, 77m, RPL 75, EF 55%-->Med Rx.  [name] Hyperlipidemia   . Cataract, right     a. 10/2010 s/p cataract surgery   . Shingles     a. 09/2012.  . Pulmonary nodule, right     a. 0.7cm RLL - stable by CT 06/2013.  . Bilateral claudication of lower limb   . SSS (sick sinus syndrome)     a. 12/2009 S/P MDT PPM ([name])  . Hyperkalemia      Past  Surgical History  Procedure Laterality Date  . Cardiac surgery  2011    Stents placed   . Cataract surgery  2012  . Colon surgery      cancer removal  . Knee arthroscopy Right 1980's  . Insert / replace / remove pacemaker    . Ablation  10/31/2013    CTI ablation by Dr Caryl Comes for atrial flutter  . Atrial flutter ablation N/A 10/31/2013    Procedure: ATRIAL FLUTTER ABLATION;  Surgeon: [name], MD;  Location: [facility] CATH LAB;  Service: Cardiovascular;  Laterality: N/A;  . Cardiac catheterization  07/2009    [facility]  . Cardiac catheterization  01/2013    [facility]  . Cardiac catheterization  03/2014    [facility]     Family History  Problem Relation Age of Onset  . Cancer Mother     'male cancer"  . Hypertension Mother   . Hyperlipidemia Mother   . Heart disease Father   . Heart attack Father   . Hypertension Father   . Hyperlipidemia Father      History   Social History  . Marital Status: Married    Spouse Name: N/A  . Number of Children: N/A  . Years of Education: N/A   Occupational History  . Not on file.   Social History Main Topics  . Smoking status: Former Smoker -- 1.00 packs/day for 30 years    Types: Cigarettes    Quit date: 07/26/2008  . Smokeless tobacco: Never Used  . Alcohol Use: No  . Drug Use: No  . Sexual Activity: Not on file   Other Topics Concern  . Not on file   Social History Narrative      PHYSICAL EXAM   BP 168/100 mmHg  Pulse 78  Ht 5\' 9"  (1.753 m)  Wt 231 lb 8 oz (105.008 kg)  BMI 34.17 kg/m2 Constitutional: He is oriented to person, place, and time. He appears well-developed and well-nourished. No distress.  HENT: No nasal discharge.  Head: Normocephalic and atraumatic.  Eyes: Pupils are equal and round.  No discharge. Neck: Normal range of motion. Neck supple. No JVD present. No thyromegaly present.  Cardiovascular: Normal rate, regular rhythm, normal heart sounds. Exam reveals no gallop and no friction rub. No murmur heard.    Pulmonary/Chest: Effort normal and diminished breath sounds . No stridor. No respiratory distress. He has no wheezes. He has no rales. He exhibits no tenderness.  Abdominal: Soft. Bowel sounds are normal. He exhibits no distension. There is no tenderness. There is no rebound and no guarding.  Musculoskeletal: Normal range of motion. He exhibits no edema and no tenderness.  Neurological: He is alert and oriented to person, place, and time.  Coordination normal.  Skin: Skin is warm and dry. No rash noted. He is not diaphoretic. No erythema. No pallor.  Psychiatric: He has a normal mood and affect. His behavior is normal. Judgment and thought content normal.  Right radial pulse is normal with no hematoma       ASSESSMENT AND PLAN

## 2014-03-24 NOTE — Assessment & Plan Note (Signed)
The patient has coronary artery disease as outlined above for which I believe optimizing medical therapy is the best option at the present time. I suspect that his exertional dyspnea and occasional chest tightness are likely multifactorial due to chronic diastolic heart failure as evidenced by his elevated left ventricular end-diastolic pressure, COPD as well as CAD. I increased the dose of Imdur to 60 mg once daily. I added small dose furosemide 20 mg once daily. Check basic metabolic profile in one week.

## 2014-03-24 NOTE — Patient Instructions (Signed)
Your physician has recommended you make the following change in your medication:  Increase Imdur to 60 mg once daily  Start Lasix 20 mg once daily   Your physician recommends that you return for lab work in:  BMP in 1 week   Your physician recommends that you schedule a follow-up appointment in:  3 months with Dr. Fletcher Anon   Your physician recommends that you schedule a follow-up appointment in:  As soon as possible with Dr. Caryl Comes

## 2014-03-31 ENCOUNTER — Ambulatory Visit: Payer: Medicare Other | Admitting: Internal Medicine

## 2014-04-02 ENCOUNTER — Other Ambulatory Visit (INDEPENDENT_AMBULATORY_CARE_PROVIDER_SITE_OTHER): Payer: Medicare Other | Admitting: *Deleted

## 2014-04-02 DIAGNOSIS — I4892 Unspecified atrial flutter: Secondary | ICD-10-CM

## 2014-04-02 DIAGNOSIS — R079 Chest pain, unspecified: Secondary | ICD-10-CM

## 2014-04-03 ENCOUNTER — Telehealth: Payer: Self-pay | Admitting: *Deleted

## 2014-04-03 LAB — BASIC METABOLIC PANEL
BUN / CREAT RATIO: 25 — AB (ref 10–22)
BUN: 23 mg/dL (ref 8–27)
CALCIUM: 9.1 mg/dL (ref 8.6–10.2)
CO2: 24 mmol/L (ref 18–29)
CREATININE: 0.91 mg/dL (ref 0.76–1.27)
Chloride: 100 mmol/L (ref 97–108)
GFR calc Af Amer: 99 mL/min/{1.73_m2} (ref >59)
GFR, EST NON AFRICAN AMERICAN: 86 mL/min/{1.73_m2} (ref >59)
Glucose: 142 mg/dL — ABNORMAL HIGH (ref 65–99)
Potassium: 5.4 mmol/L — ABNORMAL HIGH (ref 3.5–5.2)
Sodium: 139 mmol/L (ref 134–144)

## 2014-04-03 NOTE — Telephone Encounter (Signed)
Lmom to call our office for a carotid doppler (6 mth f/u).

## 2014-05-05 ENCOUNTER — Telehealth: Payer: Self-pay

## 2014-05-05 NOTE — Telephone Encounter (Signed)
Pt called, states he has no energy, and is still having cp  Pt c/o of Chest Pain: STAT if CP now or developed within 24 hours  1. Are you having CP right now? no  2. Are you experiencing any other symptoms (ex. SOB, nausea, vomiting, sweating)? SOB only  3. How long have you been experiencing CP? Since beforecath   4. Is your CP continuous or coming and going? Coming and going  5. Have you taken Nitroglycerin? No   ?   ?

## 2014-05-06 NOTE — Telephone Encounter (Signed)
Dr. Fletcher Anon recommends pt have a f/u w/ him or Thurmond Butts, if he has a schedule available.  Can we call him to set this up?

## 2014-05-10 ENCOUNTER — Emergency Department
Admit: 2014-05-10 | Disposition: A | Discharge status: Home / Self Care | Payer: Self-pay | Admitting: Emergency Medicine

## 2014-05-10 LAB — COMPREHENSIVE METABOLIC PANEL
ALK PHOS: 67 U/L
ALT: 37 U/L
AST: 41 U/L
Albumin: 3.9 g/dL
Anion Gap: 11 (ref 7–16)
BILIRUBIN TOTAL: 0.7 mg/dL
BUN: 20 mg/dL
CHLORIDE: 104 mmol/L
Calcium, Total: 9.1 mg/dL
Co2: 22 mmol/L
Creatinine: 1.15 mg/dL
EGFR (African American): >60
EGFR (Non-African Amer.): >60
Glucose: 187 mg/dL — ABNORMAL HIGH
Potassium: 3.4 mmol/L — ABNORMAL LOW
SODIUM: 137 mmol/L
Total Protein: 7.3 g/dL

## 2014-05-10 LAB — TROPONIN I

## 2014-05-10 LAB — CBC
HCT: 40.4 % (ref 40.0–52.0)
HGB: 13.9 g/dL (ref 13.0–18.0)
MCH: 31.4 pg (ref 26.0–34.0)
MCHC: 34.5 g/dL (ref 32.0–36.0)
MCV: 91 fL (ref 80–100)
PLATELETS: 186 10*3/uL (ref 150–440)
RBC: 4.44 10*6/uL (ref 4.40–5.90)
RDW: 14 % (ref 11.5–14.5)
WBC: 6.9 10*3/uL (ref 3.8–10.6)

## 2014-05-11 ENCOUNTER — Encounter: Payer: Self-pay | Admitting: Physician Assistant

## 2014-05-11 ENCOUNTER — Ambulatory Visit (INDEPENDENT_AMBULATORY_CARE_PROVIDER_SITE_OTHER): Payer: Medicare Other | Admitting: Physician Assistant

## 2014-05-11 VITALS — BP 124/84 | HR 64 | Ht 69.0 in | Wt 226.2 lb

## 2014-05-11 DIAGNOSIS — R5383 Other fatigue: Secondary | ICD-10-CM

## 2014-05-11 DIAGNOSIS — I25118 Atherosclerotic heart disease of native coronary artery with other forms of angina pectoris: Secondary | ICD-10-CM

## 2014-05-11 DIAGNOSIS — E876 Hypokalemia: Secondary | ICD-10-CM

## 2014-05-11 DIAGNOSIS — I6523 Occlusion and stenosis of bilateral carotid arteries: Secondary | ICD-10-CM

## 2014-05-11 DIAGNOSIS — I4892 Unspecified atrial flutter: Secondary | ICD-10-CM | POA: Diagnosis not present

## 2014-05-11 DIAGNOSIS — J432 Centrilobular emphysema: Secondary | ICD-10-CM

## 2014-05-11 DIAGNOSIS — I10 Essential (primary) hypertension: Secondary | ICD-10-CM

## 2014-05-11 LAB — URINALYSIS, COMPLETE
BACTERIA: NONE SEEN
BILIRUBIN, UR: NEGATIVE
Blood: NEGATIVE
Ketone: NEGATIVE
NITRITE: NEGATIVE
Ph: 5 (ref 4.5–8.0)
Protein: NEGATIVE
SPECIFIC GRAVITY: 1.015 (ref 1.003–1.030)

## 2014-05-11 LAB — TROPONIN I

## 2014-05-11 MED ORDER — METOPROLOL TARTRATE 25 MG PO TABS: 25.0000 mg | ORAL_TABLET | Freq: Two times a day (BID) | ORAL | Status: DC

## 2014-05-11 NOTE — Progress Notes (Signed)
Cardiology Office Note:  Date of Encounter: 05/11/2014  ID: Donald Juarez, DOB 1945-06-11, MRN [id number]  PCP:  Sofie Hartigan, MD Primary Cardiologist:  Dr. Fletcher Anon, MD  Chief Complaint  Patient presents with  . other    C/o chest pain, elevated BP and sob. Pt needs eye surgery May 2nd. Meds reviewed verbally with pt.    HPI:  69 year old male with history of CAD s/p stenting of the LCx in January 2015, carotid artery stenosis with occlusion on the right side with 94-70% LICA stenosis in 09/6281, COPD with previous smoking, atrial flutter status post ablation in October 2015, and status post MDT PPM 2/2 sinus node dysfunction in 2011 who is here today for follow up of recent ER visit on 05/10/2014 secondary continued/worsening fatigue and jaw pain. He was found to have a UTI and started on Kelfex 500 mg qid.    Back in January and February 2016 he was seen by pulmonary (Dr. Lake Bells) and Dr. Caryl Comes for exertional chest tightness occuring both at rest and with activities. He was started on prednisone 2/2 his worsening COPD by pulmonary. He also underwent cardiac catheterization It was felt cardiac catheterization was needed to rule out new ischemic changes 03/2014 that showed patent left circumflex stents. There was 80% stenosis in the ostial right AV groove artery at the bifurcation of the PDA. This was not an optimal location for PCI and supplied a relatively small area. Thus, it felt that the risk of PCI outweighed the benefit. Left ventricular end-diastolic pressure was 22. His dose of Bystolic was increased to 5 mg once daily. He reported no significant improvement at follow up with Dr. Fletcher Anon in 03/24/14, so at that time his Imdur was increased to 60 mg daily and Lasix 20 mg daily were added. He called 4/5 with fatigue and continued chest pain and reported to the [facility] ED on 4/10 with chest pain. Troponin negative x 2, CXR with COPD without acute exacerbation. UA showed UTI.   He presents  today stating he has continued to feel fatigued since prior to his cardiac cath in February 2016. He notes considerable dyspnea and fatigue. He denies any chest pain, though does state at times he will have a wave of discomfort come over his chest lasting a few seconds then self resolve. On the evening of 4/10 he developed bilateral jaw pain. This was a similar pain to what he experienced in 2012 that led to his first stent. He had not done anything out of the ordinary during the day on 4/10 and reports actually having one of his better days in a while. He reports feeling cold, but no associated diaphoresis. No nausea on 4/10, though he did have some nausea today. Some associated palpitations. He presented to [facility] ED where he was found to have the above work up. He comes in today feeling better and asks what can be done.      Past Medical History  Diagnosis Date  . COPD (chronic obstructive pulmonary disease)   . Carotid stenosis     a. 07/2013 Carotid U/S: 100% RICA (pt prev reported h/o 66%), LICA 29-47%, bilat ECA 50%, normal vertebrals and subclavians bilat-->F/U needed in 01/2014.  [name] Rectal cancer     a. Status post colostomy  . Hypertension   . Paroxysmal atrial flutter     a. CHA2DS2VASc = 3 - on eliquis;  b. 07/2013 Echo: EF 50-55%, mildly dil LA.c. s/p ablation 10/31/2013 by Dr Caryl Comes  .  Syncope and collapse   . Coronary artery disease     a. 06/1996 & 03/2005 Cath: non-obs dzs;  b. 07/2009 NSTEMI/Cath: LCX 99->PCI/DES extending into OM1;  c. 01/2013 Cath: LM nl, LAD 30p, 46m, LCX 30ost, 20p ISR, 32m, OM1 patent stent, OM2 30/small, RCA 20/40p, 80m, RPL 75, EF 55%-->Med Rx.; d. cath 03/2014: no evidence of occlusive CAD, patent LCx stent, bifurcation stenosis involving ostium of R AV groove artery, no PCI indicated w/o refractory symptom  . Hyperlipidemia   . Cataract, right     a. 10/2010 s/p cataract surgery   . Shingles     a. 09/2012.  . Pulmonary nodule, right     a. 0.7cm RLL - stable by  CT 06/2013.  . Bilateral claudication of lower limb   . SSS (sick sinus syndrome)     a. 12/2009 S/P MDT PPM ([name])  . Hyperkalemia   :  Past Surgical History  Procedure Laterality Date  . Cardiac surgery  2011    Stents placed   . Cataract surgery  2012  . Colon surgery      cancer removal  . Knee arthroscopy Right 1980's  . Insert / replace / remove pacemaker    . Ablation  10/31/2013    CTI ablation by Dr Caryl Comes for atrial flutter  . Atrial flutter ablation N/A 10/31/2013    Procedure: ATRIAL FLUTTER ABLATION;  Surgeon: [name], MD;  Location: [facility] CATH LAB;  Service: Cardiovascular;  Laterality: N/A;  . Cardiac catheterization  07/2009    [facility]  . Cardiac catheterization  01/2013    [facility]  . Cardiac catheterization  03/2014    [facility]  :  Social History:  The patient  reports that he quit smoking about 5 years ago. His smoking use included Cigarettes. He has a 30 pack-year smoking history. He has never used smokeless tobacco. He reports that he does not drink alcohol or use illicit drugs.   Family History  Problem Relation Age of Onset  . Cancer Mother     'male cancer"  . Hypertension Mother   . Hyperlipidemia Mother   . Heart disease Father   . Heart attack Father   . Hypertension Father   . Hyperlipidemia Father      Allergies:  No Known Allergies   Home Medications:  Current Outpatient Prescriptions  Medication Sig Dispense Refill  . albuterol (PROVENTIL) (2.5 MG/3ML) 0.083% nebulizer solution Take 3 mLs (2.5 mg total) by nebulization every 6 (six) hours as needed for wheezing or shortness of breath. DX code 493.20 120 mL 3  . amLODipine (NORVASC) 5 MG tablet TAKE ONE (1) TABLET EACH DAY 30 tablet 6  . arformoterol (BROVANA) 15 MCG/2ML NEBU Take 2 mLs (15 mcg total) by nebulization 2 (two) times daily. DX code 493.20    . aspirin 81 MG tablet Take 81 mg by mouth daily.    [name] atorvastatin (LIPITOR) 40 MG tablet Take 40 mg by mouth daily.    . budesonide  (PULMICORT) 0.25 MG/2ML nebulizer solution Take 0.25 mg by nebulization 2 (two) times daily.    . cephALEXin (KEFLEX) 500 MG capsule Take 500 mg by mouth 4 (four) times daily.    . Cholecalciferol (VITAMIN D) 2000 UNITS tablet Take 2,000 Units by mouth daily.    . fluticasone (FLONASE) 50 MCG/ACT nasal spray Place 1 spray into both nostrils daily. 16 g 3  . furosemide (LASIX) 20 MG tablet Take 1 tablet (20 mg total) by mouth  daily. 30 tablet 6  . isosorbide mononitrate (IMDUR) 60 MG 24 hr tablet Take 1 tablet (60 mg total) by mouth daily. 30 tablet 6  . nitroGLYCERIN (NITROSTAT) 0.3 MG SL tablet Place 1 tablet (0.3 mg total) under the tongue every 5 (five) minutes as needed for chest pain. 90 tablet 12  . roflumilast (DALIRESP) 500 MCG TABS tablet Take 1 tablet (500 mcg total) by mouth daily. 30 tablet 11  . tiotropium (SPIRIVA) 18 MCG inhalation capsule Place 1 capsule (18 mcg total) into inhaler and inhale daily. 30 capsule 11  . metoprolol tartrate (LOPRESSOR) 25 MG tablet Take 1 tablet (25 mg total) by mouth 2 (two) times daily. 60 tablet 6   No current facility-administered medications for this visit.     Review of Systems:  Review of Systems  Constitutional: Positive for weight loss and malaise/fatigue. Negative for fever, chills and diaphoresis.       Intermittent cold episodes on 05/10/2014  Eyes: Negative for blurred vision, double vision, photophobia and pain.  Respiratory: Positive for cough, shortness of breath and wheezing. Negative for hemoptysis and sputum production.   Cardiovascular: Positive for palpitations. Negative for chest pain, orthopnea, claudication, leg swelling and PND.  Gastrointestinal: Positive for heartburn. Negative for nausea, vomiting and abdominal pain.  Genitourinary: Positive for urgency and frequency. Negative for dysuria, hematuria and flank pain.  Musculoskeletal: Negative for myalgias.  Neurological: Positive for weakness. Negative for dizziness,  tingling and tremors.  Psychiatric/Behavioral: The patient is not nervous/anxious.      Physical Exam:  Blood pressure 124/84, pulse 64, height 5\' 9"  (1.753 m), weight 226 lb 4 oz (102.626 kg). Body mass index is 33.4 kg/(m^2). General: Pleasant, NAD Psych: Normal affect. Neuro: Alert and oriented X 3. Moves all extremities spontaneously. HEENT: Normal  Neck: Supple without bruits or JVD. Lungs:  Resp regular and unlabored, CTA. Heart: RRR no s3, s4, or murmurs. Abdomen: Soft, non-tender, non-distended, BS + x 4.  Extremities: No clubbing, cyanosis or edema. DP/PT/Radials 2+ and equal bilaterally.   Accessory Clinical Findings:  EKG - paced, 64 bpm  Other studies Reviewed: Additional studies/ records that were reviewed today include: prior notes.   Recent Labs: 10/29/2013: Platelets 219 10/31/2013: Hemoglobin 15.3 04/02/2014: BUN 23; Creatinine 0.91; Potassium 5.4*; Sodium 139    Lipid Panel No results found for: CHOL, TRIG, HDL, CHOLHDL, VLDL, LDLCALC, LDLDIRECT   Weights: Wt Readings from Last 3 Encounters:  05/11/14 226 lb 4 oz (102.626 kg)  03/24/14 231 lb 8 oz (105.008 kg)  03/20/14 227 lb (102.967 kg)     Assessment & Plan:  1. Increased dyspnea/fatigue/chest pain with moderate risk of cardiac etiology: -Schedule Lexiscan Myoview to evaluate stenosis in the ostial right AV groove artery at the bifurcation of the PDA -If deemed to be significant will review with Dr. Fletcher Anon, MD for possible intervention vs continued medical management  -Patient requests to change from Bystolic back to Lopressor, he understands this will affect his COPD.  -Stop Bystolic, start Lopressor 25 mg bid -Continue Imdur 60 mg daily -Could titrate Imdur vs add Ranexa if needed for further medical management  -Continue Lasix 20 mg daily -Check bmet  2. Carotid stenosis: -Scheduled for carotid duplex 05/2014  3. HTN: -Controlled -Continue current therapy  Dispo: -Follow up with Dr.  Fletcher Anon, in 1 month   Christell Faith, PA-C [place] [place], [place] [address] [phone] [facility] 05/11/2014, 3:29 PM

## 2014-05-11 NOTE — Patient Instructions (Addendum)
We will draw labs today:  BMET  Please stop Bystolic Please start Lopressor 25 mg twice daily  [place] caregiver has ordered a Stress Test with nuclear imaging. The purpose of this test is to evaluate the blood supply to your heart muscle. This procedure is referred to as a "Non-Invasive Stress Test." This is because other than having an IV started in your vein, nothing is inserted or "invades" your body. Cardiac stress tests are done to find areas of poor blood flow to the heart by determining the extent of coronary artery disease (CAD). Some patients exercise on a treadmill, which naturally increases the blood flow to your heart, while others who are  unable to walk on a treadmill due to physical limitations have a pharmacologic/chemical stress agent called Lexiscan . This medicine will mimic walking on a treadmill by temporarily increasing your coronary blood flow.   Please note: these test may take anywhere between 2-4 hours to complete  PLEASE REPORT TO [facility] AT THE FIRST DESK WILL DIRECT YOU WHERE TO GO  Date of Procedure:_____Tuesday, April 12____  Arrival Time for Procedure:____7:45 am_________  Instructions regarding medication:    ____:  Hold AMLODIPINE & LOPRESSOR the night before procedure and morning of procedure   PLEASE NOTIFY THE OFFICE AT LEAST 24 HOURS IN ADVANCE IF YOU ARE UNABLE TO KEEP YOUR APPOINTMENT.  [phone] AND  PLEASE NOTIFY NUCLEAR MEDICINE AT [facility] AT LEAST 24 HOURS IN ADVANCE IF YOU ARE UNABLE TO KEEP YOUR APPOINTMENT. [phone]  How to prepare for your Myoview test:  1. Do not eat or drink after midnight 2. No caffeine for 24 hours prior to test 3. No smoking 24 hours prior to test. 4. Your medication may be taken with water.  If your doctor stopped a medication because of this test, do not take that medication. 5. Ladies, please do not wear dresses.  Skirts or pants are appropriate. Please wear a  short sleeve shirt. 6. No perfume, cologne or lotion. 7. Wear comfortable walking shoes. No heels!   Your physician recommends that you schedule a follow-up appointment in: 1 month with Dr. Fletcher Anon

## 2014-05-12 ENCOUNTER — Ambulatory Visit
Admit: 2014-05-12 | Disposition: A | Discharge status: Home / Self Care | Payer: Self-pay | Attending: Physician Assistant | Admitting: Physician Assistant

## 2014-05-12 DIAGNOSIS — R079 Chest pain, unspecified: Secondary | ICD-10-CM | POA: Diagnosis not present

## 2014-05-12 LAB — BASIC METABOLIC PANEL
BUN/Creatinine Ratio: 21 (ref 10–22)
BUN: 19 mg/dL (ref 8–27)
CALCIUM: 9.3 mg/dL (ref 8.6–10.2)
CO2: 24 mmol/L (ref 18–29)
Chloride: 108 mmol/L (ref 97–108)
Creatinine, Ser: 0.92 mg/dL (ref 0.76–1.27)
GFR calc Af Amer: 98 mL/min/{1.73_m2} (ref >59)
GFR calc non Af Amer: 85 mL/min/{1.73_m2} (ref >59)
Glucose: 110 mg/dL — ABNORMAL HIGH (ref 65–99)
POTASSIUM: 3.7 mmol/L (ref 3.5–5.2)
Sodium: 144 mmol/L (ref 134–144)

## 2014-05-13 ENCOUNTER — Other Ambulatory Visit: Payer: Self-pay

## 2014-05-13 DIAGNOSIS — I4892 Unspecified atrial flutter: Secondary | ICD-10-CM

## 2014-05-13 DIAGNOSIS — E876 Hypokalemia: Secondary | ICD-10-CM

## 2014-05-20 ENCOUNTER — Other Ambulatory Visit: Payer: Medicare Other

## 2014-05-22 ENCOUNTER — Telehealth: Payer: Self-pay | Admitting: *Deleted

## 2014-05-22 ENCOUNTER — Encounter (INDEPENDENT_AMBULATORY_CARE_PROVIDER_SITE_OTHER): Payer: Medicare Other

## 2014-05-22 DIAGNOSIS — I6523 Occlusion and stenosis of bilateral carotid arteries: Secondary | ICD-10-CM | POA: Diagnosis not present

## 2014-05-22 NOTE — Discharge Summary (Signed)
PATIENT NAME:  Donald Juarez, Donald Juarez MR#:  [id number] DATE OF BIRTH:  02/13/1945  DATE OF ADMISSION:  10/12/2012 DATE OF DISCHARGE:  10/13/2012  PRIMARY CARE PHYSICIAN: Dr. Meredith Staggers.  PRIMARY PULMONOLOGIST: Dr. Raul Del.   CHIEF COMPLAINT: Shortness of breath.   DISCHARGE DIAGNOSES: 1.  Acute-on-chronic obstructive pulmonary disease exacerbation.  2.  History of atrial fibrillation/flutter.  3.  Status post pacemaker for sick sinus syndrome.  4.  Coronary artery disease.  5.  History of chronic obstructive pulmonary disease.  6.  Hyperlipidemia.  7.  Recent shingles.  8.  History of myocardial infarction in the past, status post stent.   DISCHARGE MEDICATIONS:  1.  Prednisone 50 mg for 2 days, then taper down by 10 mg every 2 days until done in 10 days.  2.  Azithromycin 3-day Dosepak, 1 tablet daily for 3 days.  3.  Atorvastatin 40 mg daily.  4.  Gabapentin 100 mg daily.  5.  Aspirin 81 mg daily.  6.  Diltiazem, extended-release, 240 mg once a day.  7.  Eliquis 1 tablet 2 times a day.  8.  Metoprolol tartrate 50 mg 2 times a day.  9.  Albuterol nebs every 6 hours as needed for shortness of breath.  10.  Symbicort 160/4.5 mcg, 2 puffs 2 times a day.  11.  Vitamin D3 2000 units 1 capsule once a day.  12.  ProAir, p.r.n.  13.  Spiriva 18 mcg inhaled, 1 capsule daily.   DIET: Low sodium.   ACTIVITY: As tolerated.   FOLLOWUP: Please follow with Dr. Raul Del tomorrow, as he discussed. Follow with PCP within 1 to 2 weeks.   DISPOSITION: Home.   HISTORY OF PRESENT ILLNESS AND HOSPITAL COURSE: For full details of H and P, please see the dictation by Dr. Laurin Coder yesterday, but briefly this is a pleasant 69 year old male with COPD who recently had shingles. Came in for shortness of breath and a cough. He was admitted for observation for a COPD flare. There was no fever, and his O2 sats were 92% on room air in the ER.   Actually, the chest did not show any significant infiltrate.   He had  LFTs which were within normal limits. Troponin was negative, and his initial WBC was 6.7. He was started on around-the-clock steroids and also nebulizers. Of note, he takes Symbicort as well as and Advair as he stated that his Spiriva ran out and he could not fill it because he did not have any money. The importance of taking Advair or Symbicort and not both was stressed, and he is aware now. He has an appointment with Dr. Raul Del more.   At this point he feels significantly better. His cough is improved. His shortness of breath is improved. He is not labored, and today on examining him he is not very wheezy and he is very eager to go home and take care of his bedridden wife, who has no other help. He has been weaned off of oxygen. He feels at baseline.   On the day of discharge, his temperature is 98.7 pulse rate was 110, respiratory rate 17, blood pressure 141/92. His last O2 sat was 92% on room air.   GENERAL: The patient is an obese male sitting on a chair, no obvious distress.  HEENT: Normocephalic, atraumatic. Moist mucous membranes.  NECK: Supple. CARDIOVASCULAR: S1, S2. Regular rate and rhythm.   Examination of the lungs shows good air entry skeletal. Scattered mild wheezing in the right base, but  otherwise no crackles or rhonchus sounds.  ABDOMEN: Obese, nontender.  EXTREMITIES: Show no lower extremity edema.   At this point he will be discharged with a prednisone taper and azithromycin, and he has an appointment with Dr. Raul Del tomorrow morning.   Total time spent: Thirty minutes   ____________________________ Vivien Presto, MD sa:dm D: 10/13/2012 10:43:45 ET T: 10/13/2012 12:48:26 ET JOB#: [id number]  cc: Vivien Presto, MD, <Dictator> Herbon E. Raul Del, MD Vivien Presto MD ELECTRONICALLY SIGNED 10/22/2012 14:06

## 2014-05-22 NOTE — Telephone Encounter (Signed)
Pt c/o medication issue:  1. Name of Medication: Furosmide  2. How are you currently taking this medication (dosage and times per day)? Once a day  3. Are you having a reaction (difficulty breathing--STAT)? Yes   4. What is your medication issue? Patient states he is having uncontrolable urinating during the day and worse at night. Patient mentioned he does have a uti and is on antibiotics.  Patient would also like the results of his stress test.

## 2014-05-22 NOTE — Telephone Encounter (Signed)
1. Patient can decrease his Lasix to 10 mg in the interim  2. Suggest he get a repeat UA with his PCP 3. Stress test has never come to me for review. We need to contact [facility] to find out why this happens.  -Stress test was low risk, no signs of significant ischemia, normal EF, no EKG changes concerning for ischemia  4. Increase Imdur to 90 mg daily (if continuing to have symptoms)

## 2014-05-22 NOTE — Telephone Encounter (Signed)
Spoke w/ pt.  Advised him of Ryan's recommendation.  He verbalizes understanding and will call back w/ any questions or concerns.

## 2014-05-22 NOTE — H&P (Signed)
PATIENT NAME:  Donald Juarez, Donald Juarez MR#:  [id number] DATE OF BIRTH:  Oct 14, 1945  DATE OF ADMISSION:  10/12/2012  REFERRING PHYSICIAN: Dr. Corky Downs.  PULMONOLOGIST: Dr. Raul Del.   PRIMARY CARE PHYSICIAN: Dr. Meredith Staggers.  CARDIOLOGIST: Dr. Nehemiah Massed.   HISTORY OF PRESENT ILLNESS: This is a very nice 69 year old gentleman with history of atrial flutter, status post pacemaker due to sick sinus syndrome, coronary artery disease, COPD, hyperlipidemia, recently diagnosed with shingles, comes with a history of shortness of breath for 4 days. The patient states that he has occasional problems with wheezing and cough that comes and goes for 24 hours, then he coughs, gets all the mucus out and he starts to get better. In this case, it did not get better after 24 hours. The patient stayed at home because he felt like he could not deal with that today. He is just tired of coughing. He feels like his abdomen hurts from all the coughing and has an increase in secretions. The cough is continuous. The sputum is white but very productive. He denies any fever or chills. He was evaluated in the ER, where his oxygen saturations were around 92. He is afebrile. His chest x-ray did not show any consolidations. His white count is normal. The patient is admitted as an observation for treatment of an acute exacerbation of COPD.   The patient is asking me questions about what to do to prevent this. At this moment, he is taking most of all of his medications. The only thing is he is taking Advair and Symbicort at the same time, and he stopped taking his Dulera and Spiriva, which we are going to put him back on. The patient overall is stable and he is going to go to the regular floor.   TWELVE-SYSTEM REVIEW OF SYSTEMS:  CONSTITUTIONAL: No fever, fatigue. No weakness or weight loss or weight gain.  EYES: No blurry vision, double vision.  EARS, NOSE, THROAT: No difficulty swallowing. No tinnitus.  RESPIRATORY: Positive cough. Positive  wheezing. Positive shortness of breath.  CARDIOVASCULAR: No chest pain. No orthopnea. No palpitations.  GASTROINTESTINAL: No nausea, vomiting or diarrhea.  GENITOURINARY: No dysuria or hematuria. No prostatitis.  ENDOCRINE: No polyuria, polydipsia, polyphagia or cold or heat intolerance.  HEMATOLOGIC AND LYMPHATIC: No anemia, easy bruising or swollen glands.  SKIN: No rashes or petechiae.  MUSCULOSKELETAL: No neck pain, back pain or gout.  NEUROLOGIC: No numbness, tingling, CVAs or TIAs.  PSYCHIATRIC: No anxiety or depression.   PAST MEDICAL HISTORY: 1.  Atrial flutter/atrial fibrillation.  2.  History of colon cancer.  3.  Sick sinus syndrome, status post pacemaker.  4.  Coronary artery disease.  5.  MI in 2011, status post stent.  6.  COPD.  7.  Shingles.  8.  Hyperlipidemia.   ALLERGIES: No known drug allergies.   PAST SURGICAL HISTORY: Colon resection with ostomy, cholecystectomy, right knee surgery, cataract surgery and stent placement.   SOCIAL HISTORY: The patient used to smoke, but quit 4 years ago. He said that he smoked occasionally, never a lot, but in previous history said that he could smoke a pack a day for a long time. He lives with his wife. He does not drink. He is retired from a Conservation officer, historic buildings.  FAMILY HISTORY: Positive for MI in his father.   MEDICATIONS AT DISCHARGE: Advair 500/50 (this is sample that he has), albuterol 2.5 mg inhaled, aspirin 81 mg daily, atorvastatin 40 mg daily, diltiazem 240 mg daily, Eliquis unknown doses (the  patient seems to believe it is 2.5 mg twice daily), gabapentin 100 mg daily, metoprolol 25 mg twice daily, ProAir 90 mcg inhaled, Symbicort 160/4.5 mg 2 puffs twice daily, vitamin D 2000 units once a day. The patient states that he used to take Spiriva too, but he cannot afford it most of the time.   PHYSICAL EXAMINATION: VITAL SIGNS: Blood pressure 149/101, heart rate of 114, respirations 18, temperature 97.7. Oxygen  saturation: The lowest was 92% on room air.  GENERAL: Alert, oriented x 3, in no acute distress, no respiratory distress. Very pleasant gentleman.  HEENT: Pupils are equal and reactive. Extraocular movements are intact. Mucosae are moist. Anicteric sclerae. Pink conjunctivae. No oral lesions. No oropharyngeal exudates.  NECK: Supple. No JVD. No thyromegaly. No adenopathy. No carotid bruits. No rigidity.  CARDIOVASCULAR: Regular rate and rhythm, slightly tachycardic. No murmurs, rubs or gallops are appreciated at this moment. No displacement of PMI.  LUNGS: Positive wheezing, diffuse throughout all the respiratory fields. No significant rales. No use of accessory muscles at this moment. No dullness to percussion.  ABDOMEN: Soft, nontender, nondistended. No hepatosplenomegaly. No masses. Bowel sounds are positive. Positive colostomy bag on the left lower quadrant with good amount of stool.  GENITAL: Negative for external lesions.  EXTREMITIES: No edema, cyanosis or clubbing. Pulses +2. Capillary refill less than 3. Bevelyn Buckles is negative.  SKIN: No rashes, petechiae or new lesions.  LYMPHATIC: Negative for lymphadenopathy in the neck or supraclavicular areas.  VASCULAR: Good pulses in upper extremities, lower extremities +1. The patient states that he has significant claudication and never being examined for that. There is decreased hair on the lower extremities and his skin is thin with a little bit of brown coloration to it, possible changes secondary to peripheral artery disease.  NEUROLOGIC: Cranial nerves II through XII intact. Strength is 5 out of 5 in all 4 extremities.  PSYCHIATRIC: Normal judgment. No significant findings of agitation. The patient is alert, oriented x 3.  MUSCULOSKELETAL: No significant joint effusions or joint edema.   LABORATORY, DIAGNOSTIC AND RADIOLOGICAL DATA: Glucose 137, BUN 11, creatinine 1.66, other electrolytes within normal limits. LFTs were normal. Troponin 0.02. White  count 6.7, hemoglobin 15, platelet count 201.   Chest x-ray: No significant infiltrates. Chest CT with contrast shows stable pulmonary nodules; surveillance in 6 months.   EKG: Sinus tachycardia. No ST depression or elevation   ASSESSMENT AND PLAN: This is a 69 year old gentleman with history of chronic obstructive pulmonary disease, coronary artery disease, hyperlipidemia, previous colon cancer, atrial flutter, sick sinus syndrome, status post pacemaker, comes with increased shortness of breath and wheezing. 1.  Chronic obstructive pulmonary disease exacerbation: The patient is admitted for treatment of exacerbation of chronic obstructive pulmonary disease. At this moment, he is not hypoxic. He seems stable but continues to have cough and shortness of breath, not improving with nebulizers.  The patient is admitted for treatment of this problem. Steroids IV are going to be provided at 40 mg every 6 hours. Try to change to oral prednisone tomorrow. We will continue Symbicort. We started him back on Spiriva. Nebulizers q.4 hour schedule. The patient does not seem to have any significant pneumonia, but he has increased secretions severely and the problem does not seem to be improving within the past week, for what we are going to start him on antibiotics to shorten the significant symptomatology. The patient is to follow up outpatient with Dr. Raul Del once discharged. Provide oxygen p.r.n. to keep oxygen saturation in  between 88% and 92%. Provide flu shot and pneumonia shot at discharge.  2.  Atrial fibrillation: The patient has a little bit of increase in his heart rate, likely due to the nebulizers. At this moment is stable. Continue diltiazem. No signs of atrial fibrillation. Right now is normal sinus rhythm.  3.  Sick sinus syndrome: The patient has a pacemaker. At this moment is controlled.  4.  Coronary artery disease: Continue beta blocker. Continue aspirin and statin.  5.  Dyslipidemia: Continue  statin.  6.  Other medical problems are stable.   TIME SPENT: I spent about 50 minutes with this patient.    ____________________________ Hettinger Sink, MD rsg:jm D: 10/12/2012 14:20:53 ET T: 10/12/2012 15:44:50 ET JOB#: [id number]  cc: Cathay Sink, MD, <Dictator> Charde Macfarlane America Brown MD ELECTRONICALLY SIGNED 10/14/2012 13:55

## 2014-05-26 ENCOUNTER — Telehealth: Payer: Self-pay

## 2014-05-26 MED ORDER — ISOSORBIDE MONONITRATE ER 60 MG PO TB24: 90.0000 mg | ORAL_TABLET | Freq: Every day | ORAL | Status: DC

## 2014-05-26 NOTE — Telephone Encounter (Signed)
Pt called, states he needs a new rx for Imdur,  And furosemide. States when he cuts them, they "break all to pieces". Pt also states we were to get a urine specimen from him, and he needs to know where to take it. Pt okays the message to be left with his wife, Blanch Media.

## 2014-05-26 NOTE — Telephone Encounter (Signed)
Spoke w/ pt. Advised her that the he is on the smallest dose of lasix & imdur and that he can speak w/ his pharmacist about possibly cutting these in 1/2 if he is having difficulty, but these may be temporary doses until his UTI is resolved. Advised him to contact his PCP about a repeat u/a. He states that he is worried about his UTI worsening, as his brother died "when they started messing w/ his kidneys." Advised him to f/u w/ PCP, as he states that he cannot sleep due to the pain. He is agreeable and states that he will have them send the results to [place].

## 2014-05-27 ENCOUNTER — Encounter: Payer: Self-pay | Admitting: *Deleted

## 2014-06-01 ENCOUNTER — Ambulatory Visit: Payer: Medicare Other | Admitting: Anesthesiology

## 2014-06-01 ENCOUNTER — Encounter: Payer: Self-pay | Admitting: *Deleted

## 2014-06-01 ENCOUNTER — Ambulatory Visit
Admission: RE | Admit: 2014-06-01 | Discharge: 2014-06-01 | Disposition: A | Discharge status: Home / Self Care | Payer: Medicare Other | Source: Ambulatory Visit | Attending: Ophthalmology | Admitting: Ophthalmology

## 2014-06-01 ENCOUNTER — Encounter
Admission: RE | Disposition: A | Discharge status: Home / Self Care | Payer: Medicare Other | Source: Ambulatory Visit | Attending: Ophthalmology

## 2014-06-01 DIAGNOSIS — Z87891 Personal history of nicotine dependence: Secondary | ICD-10-CM | POA: Insufficient documentation

## 2014-06-01 DIAGNOSIS — R569 Unspecified convulsions: Secondary | ICD-10-CM | POA: Insufficient documentation

## 2014-06-01 DIAGNOSIS — H2512 Age-related nuclear cataract, left eye: Secondary | ICD-10-CM | POA: Diagnosis present

## 2014-06-01 DIAGNOSIS — I10 Essential (primary) hypertension: Secondary | ICD-10-CM | POA: Diagnosis not present

## 2014-06-01 DIAGNOSIS — I252 Old myocardial infarction: Secondary | ICD-10-CM | POA: Insufficient documentation

## 2014-06-01 DIAGNOSIS — Z955 Presence of coronary angioplasty implant and graft: Secondary | ICD-10-CM | POA: Insufficient documentation

## 2014-06-01 HISTORY — DX: Unspecified asthma, uncomplicated: J45.909

## 2014-06-01 HISTORY — DX: Unspecified convulsions: R56.9

## 2014-06-01 HISTORY — DX: Acute myocardial infarction, unspecified: I21.9

## 2014-06-01 HISTORY — PX: CATARACT EXTRACTION W/PHACO: SHX586

## 2014-06-01 SURGERY — PHACOEMULSIFICATION, CATARACT, WITH IOL INSERTION
Anesthesia: Monitor Anesthesia Care | Laterality: Left

## 2014-06-01 MED ORDER — NA CHONDROIT SULF-NA HYALURON 40-17 MG/ML IO SOLN
INTRAOCULAR | Status: AC
  Filled 2014-06-01: qty 1

## 2014-06-01 MED ORDER — TETRACAINE HCL 0.5 % OP SOLN
OPHTHALMIC | Status: AC
  Filled 2014-06-01: qty 2

## 2014-06-01 MED ORDER — MIDAZOLAM HCL 2 MG/2ML IJ SOLN
INTRAMUSCULAR | Status: DC | PRN
  Administered 2014-06-01: 1.5 mg via INTRAVENOUS

## 2014-06-01 MED ORDER — NA CHONDROIT SULF-NA HYALURON 40-17 MG/ML IO SOLN
INTRAOCULAR | Status: DC | PRN
  Administered 2014-06-01: 1 mL via INTRAOCULAR

## 2014-06-01 MED ORDER — BUPIVACAINE HCL (PF) 0.75 % IJ SOLN
INTRAMUSCULAR | Status: AC
  Filled 2014-06-01: qty 10

## 2014-06-01 MED ORDER — TETRACAINE HCL 0.5 % OP SOLN
OPHTHALMIC | Status: DC | PRN
Start: 2014-06-01 — End: 2014-06-01
  Administered 2014-06-01: 2 [drp] via OPHTHALMIC

## 2014-06-01 MED ORDER — SODIUM CHLORIDE 0.9 % IV SOLN
INTRAVENOUS | Status: DC
  Administered 2014-06-01: 07:00:00 via INTRAVENOUS

## 2014-06-01 MED ORDER — LIDOCAINE HCL (PF) 4 % IJ SOLN
INTRAMUSCULAR | Status: DC | PRN
  Administered 2014-06-01: 08:00:00 via OPHTHALMIC

## 2014-06-01 MED ORDER — MOXIFLOXACIN HCL 0.5 % OP SOLN
1.0000 [drp] | OPHTHALMIC | Status: AC
  Administered 2014-06-01 (×3): 1 [drp] via OPHTHALMIC

## 2014-06-01 MED ORDER — PHENYLEPHRINE HCL 10 % OP SOLN
1.0000 [drp] | OPHTHALMIC | Status: AC
  Administered 2014-06-01 (×4): 1 [drp] via OPHTHALMIC

## 2014-06-01 MED ORDER — EPINEPHRINE HCL 1 MG/ML IJ SOLN
INTRAMUSCULAR | Status: AC
  Filled 2014-06-01: qty 1

## 2014-06-01 MED ORDER — MOXIFLOXACIN HCL 0.5 % OP SOLN
OPHTHALMIC | Status: AC
  Filled 2014-06-01: qty 3

## 2014-06-01 MED ORDER — CARBACHOL 0.01 % IO SOLN
INTRAOCULAR | Status: DC | PRN
  Administered 2014-06-01: 0.5 mL via INTRAOCULAR

## 2014-06-01 MED ORDER — CYCLOPENTOLATE HCL 2 % OP SOLN
1.0000 [drp] | OPHTHALMIC | Status: AC
  Administered 2014-06-01 (×4): 1 [drp] via OPHTHALMIC

## 2014-06-01 MED ORDER — CYCLOPENTOLATE HCL 2 % OP SOLN
OPHTHALMIC | Status: AC
  Filled 2014-06-01: qty 2

## 2014-06-01 MED ORDER — CEFUROXIME OPHTHALMIC INJECTION 1 MG/0.1 ML
INJECTION | OPHTHALMIC | Status: AC
  Filled 2014-06-01: qty 0.1

## 2014-06-01 MED ORDER — HYALURONIDASE HUMAN 150 UNIT/ML IJ SOLN
INTRAMUSCULAR | Status: AC
  Filled 2014-06-01: qty 1

## 2014-06-01 MED ORDER — PHENYLEPHRINE HCL 10 % OP SOLN
OPHTHALMIC | Status: AC
  Filled 2014-06-01: qty 5

## 2014-06-01 MED ORDER — EPINEPHRINE HCL 1 MG/ML IJ SOLN
INTRAOCULAR | Status: DC | PRN
  Administered 2014-06-01: 08:00:00

## 2014-06-01 MED ORDER — MOXIFLOXACIN HCL 0.5 % OP SOLN - NO CHARGE
OPHTHALMIC | Status: DC | PRN
  Administered 2014-06-01: 1 [drp]

## 2014-06-01 MED ORDER — ALFENTANIL 500 MCG/ML IJ INJ
INJECTION | INTRAMUSCULAR | Status: DC | PRN
  Administered 2014-06-01: 500 ug via INTRAVENOUS

## 2014-06-01 MED ORDER — LIDOCAINE HCL (PF) 4 % IJ SOLN
INTRAMUSCULAR | Status: AC
  Filled 2014-06-01: qty 5

## 2014-06-01 SURGICAL SUPPLY — 24 items
CORD BIP STRL DISP 12FT (MISCELLANEOUS) ×3 IMPLANT
ERASER HMR WETFIELD 18G (MISCELLANEOUS) ×3 IMPLANT
GLOVE BIO SURGEON STRL SZ8 (GLOVE) ×3 IMPLANT
GLOVE SURG LX 6.5 MICRO (GLOVE) ×2
GLOVE SURG LX 8.0 MICRO (GLOVE) ×2
GLOVE SURG LX STRL 6.5 MICRO (GLOVE) ×1 IMPLANT
GLOVE SURG LX STRL 8.0 MICRO (GLOVE) ×1 IMPLANT
GOWN STRL REUS W/ TWL LRG LVL3 (GOWN DISPOSABLE) ×1 IMPLANT
GOWN STRL REUS W/ TWL XL LVL3 (GOWN DISPOSABLE) ×1 IMPLANT
GOWN STRL REUS W/TWL LRG LVL3 (GOWN DISPOSABLE) ×2
GOWN STRL REUS W/TWL XL LVL3 (GOWN DISPOSABLE) ×2
LENS IOL ACRSF IQ PC 22.0 (Intraocular Lens) ×1 IMPLANT
LENS IOL ACRYSOF IQ POST 22.0 (Intraocular Lens) ×3 IMPLANT
PACK CATARACT (MISCELLANEOUS) ×3 IMPLANT
PACK CATARACT DINGLEDEIN LX (MISCELLANEOUS) ×3 IMPLANT
PACK EYE AFTER SURG (MISCELLANEOUS) ×3 IMPLANT
SHLD EYE VISITEC  UNIV (MISCELLANEOUS) ×3 IMPLANT
SOL PREP PVP 2OZ (MISCELLANEOUS) ×3
SOLUTION PREP PVP 2OZ (MISCELLANEOUS) ×1 IMPLANT
SUT SILK 5-0 (SUTURE) ×3 IMPLANT
SYR 5ML LL (SYRINGE) ×3 IMPLANT
SYR TB 1ML 27GX1/2 LL (SYRINGE) ×3 IMPLANT
WATER STERILE IRR 1000ML POUR (IV SOLUTION) ×3 IMPLANT
WIPE NON LINTING 3.25X3.25 (MISCELLANEOUS) ×3 IMPLANT

## 2014-06-01 NOTE — H&P (Signed)
History and physical has been scanned in. No changes.

## 2014-06-01 NOTE — Transfer of Care (Signed)
Immediate Anesthesia Transfer of Care Note  Patient: Donald Juarez  Procedure(s) Performed: Procedure(s): CATARACT EXTRACTION PHACO AND INTRAOCULAR LENS PLACEMENT (IOC) (Left)  Patient Location: PACU  Anesthesia Type:MAC  Level of Consciousness: awake, alert  and oriented  Airway & Oxygen Therapy: Patient Spontanous Breathing  Post-op Assessment: Report given to RN  Post vital signs: Reviewed and stable  Last Vitals:  Filed Vitals:   06/01/14 0635  BP: 144/74  Pulse: 51  Temp: 36.6 C  Resp: 16    Complications: No apparent anesthesia complications

## 2014-06-01 NOTE — Anesthesia Procedure Notes (Signed)
Procedure Name: MAC Performed by: POPEJoelene Millin Pre-anesthesia Checklist: Patient identified, Emergency Drugs available, Suction available, Patient being monitored and Timeout performed Patient Re-evaluated:Patient Re-evaluated prior to inductionOxygen Delivery Method: Nasal cannula

## 2014-06-01 NOTE — Anesthesia Postprocedure Evaluation (Signed)
Anesthesia Post Note  Patient: Donald Juarez  Procedure(s) Performed: Procedure(s) (LRB): CATARACT EXTRACTION PHACO AND INTRAOCULAR LENS PLACEMENT (IOC) (Left)  Anesthesia type: MAC  Patient location: PACU  Post pain: Pain level controlled  Post assessment: Post-op Vital signs reviewed  Last Vitals:  Filed Vitals:   06/01/14 0847  BP: 148/82  Pulse: 78  Temp: 36.5 C  Resp:     Post vital signs: stable  Level of consciousness: Awake  Complications: No apparent anesthesia complications

## 2014-06-01 NOTE — Op Note (Signed)
Date of Surgery: 06/01/2014 Date of Dictation: 06/01/2014 Pre-operative Diagnosis: Nuclear Sclerotic Cataract Left Eye Post-operative Diagnosis: same Procedure performed: Extra-capsular Cataract Extraction (ECCE) with placement of a posterior chamber intraocular lens (IOL) Left Eye Lens: Alcon [id number] 21.5 D sn [id number] Anesthesia: 2% Lidocaine and 4% Marcaine in a 50/50 mixture with 10 unites/ml of Hylenex given as a peribulbar Anesthesiologist: [place] [facility] Complications: none Estimated Blood Loss: less than 1 ml  Description of procedure:  The patient was given anesthesia and sedation via intravenous access. The patient was then prepped and draped in the usual fashion. A 25-gauge needle was bent for initiating the capsulorhexis. A 5-0 silk suture was placed through the conjunctiva superior and inferiorly to serve as bridle sutures. Hemostasis was obtained at the superior limbus using an eraser cautery. A partial thickness groove was made at the anterior surgical limbus with a 64 Beaver blade and this was dissected anteriorly with an [employer]. The anterior chamber was entered at 10 o'clock with a 1.0 mm paracentesis knife and through the lamellar dissection with a 2.6 mm Alcon keratome. DiscoVisc was injected to replace the aqueous and a continuous tear curvilinear capsulorhexis was performed using a bent 25-gauge needle.  Balance salt on a syringe was used to perform hydro-dissection and phacoemulsification was carried out using a divide and conquer technique. Total ultrasound time was 1:10. The average ultrasonic power was 24.0. The CDE was 28.99.  Irrigation/aspiration was used to remove the residual cortex and the capsular bag was inflated with DiscoVisc. The intraocular lens was inserted into the capsular bag using a pre-loaded AcrySert Delivery System. Irrigation/aspiration was used to remove the residual DiscoVisc. The wound was inflated with balanced salt and checked for leaks.  None were found. Miostat was injected via the paracentesis track and 0.1 ml of cefuroxime containing 1 mg of drug was injected via the paracentesis track. The wound was checked for leaks again and none were found.   The bridal sutures were removed and two drops of Vigamox were placed on the eye. An eye shield was placed to protect the eye and the patient was discharged to the recovery area in good condition.   Ayla Dunigan MD

## 2014-06-01 NOTE — Discharge Instructions (Signed)
Reviewed Eye Surgery Discharge InstructionsCataract Surgery  A cataract is a clouding of the lens of the eye. When a lens becomes cloudy, vision is reduced based on the degree and nature of the clouding. Surgery may be needed to improve vision. Surgery removes the cloudy lens and usually replaces it with a substitute lens (intraocular lens, IOL). LET YOUR EYE DOCTOR KNOW ABOUT:  Allergies to food or medicine.  Medicines taken including herbs, eye drops, over-the-counter medicines, and creams.  Use of steroids (by mouth or creams).  Previous problems with anesthetics or numbing medicine.  History of bleeding problems or blood clots.  Previous surgery.  Other health problems, including diabetes and kidney problems.  Possibility of pregnancy, if this applies. RISKS AND COMPLICATIONS  Infection.  Inflammation of the eyeball (endophthalmitis) that can spread to both eyes (sympathetic ophthalmia).  Poor wound healing.  If an IOL is inserted, it can later fall out of proper position. This is very uncommon.  Clouding of the part of your eye that holds an IOL in place. This is called an "after-cataract." These are uncommon but easily treated. BEFORE THE PROCEDURE  Do not eat or drink anything except small amounts of water for 8 to 12 before your surgery, or as directed by your caregiver.  Unless you are told otherwise, continue any eye drops you have been prescribed.  Talk to your primary caregiver about all other medicines that you take (both prescription and nonprescription). In some cases, you may need to stop or change medicines near the time of your surgery. This is most important if you are taking blood-thinning medicine.Do not stop medicines unless you are told to do so.  Arrange for someone to drive you to and from the procedure.  Do not put contact lenses in either eye on the day of your surgery. PROCEDURE There is more than one method for safely removing a cataract. Your  doctor can explain the differences and help determine which is best for you. Phacoemulsification surgery is the most common form of cataract surgery.  An injection is given behind the eye or eye drops are given to make this a painless procedure.  A small cut (incision) is made on the edge of the clear, dome-shaped surface that covers the front of the eye (cornea).  A tiny probe is painlessly inserted into the eye. This device gives off ultrasound waves that soften and break up the cloudy center of the lens. This makes it easier for the cloudy lens to be removed by suction.  An IOL may be implanted.  The normal lens of the eye is covered by a clear capsule. Part of that capsule is intentionally left in the eye to support the IOL.  Your surgeon may or may not use stitches to close the incision. There are other forms of cataract surgery that require a larger incision and stitches to close the eye. This approach is taken in cases where the doctor feels that the cataract cannot be easily removed using phacoemulsification. AFTER THE PROCEDURE  When an IOL is implanted, it does not need care. It becomes a permanent part of your eye and cannot be seen or felt.  Your doctor will schedule follow-up exams to check on your progress.  Review your other medicines with your doctor to see which can be resumed after surgery.  Use eye drops or take medicine as prescribed by your doctor. Document Released: 01/05/2011 Document Revised: 06/02/2013 Document Reviewed: 01/05/2011 [facility] Patient Information 2015 [place], [place]. This information  is not intended to replace advice given to you by your health care provider. Make sure you discuss any questions you have with your health care provider.

## 2014-06-01 NOTE — Addendum Note (Signed)
Addendum  created 06/01/14 1024 by Iver Nestle, MD   Modules edited: Anesthesia Attestations, Clinical Notes   Clinical Notes:  File: [id number]

## 2014-06-01 NOTE — OR Nursing (Addendum)
[place] 01:10 AP% 24.0 CDE 28.99  Cefuroxime intracameral 0.84ml /mg left eye

## 2014-06-01 NOTE — Anesthesia Preprocedure Evaluation (Addendum)
Anesthesia Evaluation  Patient identified by MRN, date of birth, ID band Patient awake    Airway Mallampati: II       Dental no notable dental hx.    Pulmonary former smoker,    Pulmonary exam normal       Cardiovascular hypertension, + CAD, + Past MI and + Cardiac Stents     Neuro/Psych Seizures -,  negative psych ROS   GI/Hepatic negative GI ROS, Neg liver ROS,   Endo/Other  negative endocrine ROS  Renal/GU negative Renal ROS  negative genitourinary   Musculoskeletal negative musculoskeletal ROS (+)   Abdominal Normal abdominal exam  (+)   Peds negative pediatric ROS (+)  Hematology negative hematology ROS (+)   Anesthesia Other Findings   Reproductive/Obstetrics                          Anesthesia Physical Anesthesia Plan  ASA: II  Anesthesia Plan: MAC   Post-op Pain Management:    Induction:   Airway Management Planned: Natural Airway  Additional Equipment:   Intra-op Plan:   Post-operative Plan:   Informed Consent: I have reviewed the patients History and Physical, chart, labs and discussed the procedure including the risks, benefits and alternatives for the proposed anesthesia with the patient or authorized representative who has indicated his/her understanding and acceptance.     Plan Discussed with: CRNA and Surgeon  Anesthesia Plan Comments:        Anesthesia Quick Evaluation

## 2014-06-01 NOTE — Discharge Summary (Signed)
Patient was received to the unit alert and oriented. He stated he was experiencing no pain. Vital signs were assessed and documented. Discharge instructions were reviewed with the patient and the present family member. The patient was discharged via wheelchair to his family members car.

## 2014-06-01 NOTE — Anesthesia Postprocedure Evaluation (Signed)
  Anesthesia Post-op Note  Patient: Donald Juarez  Procedure(s) Performed: Procedure(s): CATARACT EXTRACTION PHACO AND INTRAOCULAR LENS PLACEMENT (IOC) (Left)  Anesthesia type:MAC  Patient location: PACU  Post pain: Pain level controlled  Post assessment: Post-op Vital signs reviewed, Patient's Cardiovascular Status Stable, Respiratory Function Stable, Patent Airway and No signs of Nausea or vomiting  Post vital signs: Reviewed and stable  Last Vitals:  Filed Vitals:   06/01/14 0635  BP: 144/74  Pulse: 51  Temp: 36.6 C  Resp: 16    Level of consciousness: awake, alert  and patient cooperative  Complications: No apparent anesthesia complications

## 2014-06-08 ENCOUNTER — Encounter: Payer: Self-pay | Admitting: Ophthalmology

## 2014-06-12 ENCOUNTER — Ambulatory Visit (INDEPENDENT_AMBULATORY_CARE_PROVIDER_SITE_OTHER): Payer: Medicare Other | Admitting: Pulmonary Disease

## 2014-06-12 ENCOUNTER — Encounter: Payer: Self-pay | Admitting: Pulmonary Disease

## 2014-06-12 VITALS — BP 152/86 | HR 66 | Ht 69.0 in | Wt 219.0 lb

## 2014-06-12 DIAGNOSIS — J432 Centrilobular emphysema: Secondary | ICD-10-CM

## 2014-06-12 DIAGNOSIS — I2583 Coronary atherosclerosis due to lipid rich plaque: Secondary | ICD-10-CM

## 2014-06-12 DIAGNOSIS — R911 Solitary pulmonary nodule: Secondary | ICD-10-CM

## 2014-06-12 DIAGNOSIS — I2510 Atherosclerotic heart disease of native coronary artery without angina pectoris: Secondary | ICD-10-CM

## 2014-06-12 DIAGNOSIS — I4892 Unspecified atrial flutter: Secondary | ICD-10-CM | POA: Diagnosis not present

## 2014-06-12 NOTE — Assessment & Plan Note (Signed)
Bralyn tolerated the long course of prednisone fairly well but he did have some insomnia. Fortunately he has not had further exacerbation since we did that. That said, he has very severe COPD and has frequent recurrent exacerbations and continues to be quite symptomatic in terms of exertional dyspnea. I explained to him today that we need to continue to make efforts to prevent these from happening again, to that and I want him to take the Roflumilast. Unfortunately, he has not been taking it because of cost.  Plan: Continue Brovana and Pulmicort Continue Spiriva Roflumilast daily, we will apply for financial assistance I told him to call us in 2 weeks if he has not heard about financial assistance If we cannot get financial assistance for him for the Roflumilast then we will use doxycycline on a one-week basis at the beginning of every month to try to prevent exacerbations Because of his cardiac disease I'm reluctant to use daily azithromycin Follow-up 3 months or sooner if needed

## 2014-06-12 NOTE — Assessment & Plan Note (Signed)
LHC results reviewed Agree with cardiology management Continue current regimen

## 2014-06-12 NOTE — Assessment & Plan Note (Signed)
Normal heart rate today, tolerating metoprolol despite COPD.  Plan Continue metoprolol

## 2014-06-12 NOTE — Assessment & Plan Note (Signed)
Stable imaging, no further imaging needed

## 2014-06-12 NOTE — Progress Notes (Signed)
Subjective:    Patient ID: Donald Juarez, male    DOB: 02/17/1945, 69 y.o.   MRN: [id number]  Synopsis: Donald Juarez first saw the [facility] clinic in 2014 for COPD after smoking 1 PPD for 30 years and quitting in 2010.  He has frequent exacerbations.    03/2013 Full PFT> Ratio 53% FEV1 1.89 (65% pred, 13% change), TLC 5.83 L (93% pred), DLCO 16.6 (69% pred)  HPI Chief Complaint  Patient presents with  . Follow-up    Pt states his breathing is stable since last visit- prod cough with clear mucus, sob with exertion.  Had a heart cath since last visit, was told his heart is affecting his breathing as well.    Donald Juarez has been having problems with his blood pressure at home lately and ended up in the ER in April.  He had a CXR.  His BP has been better since.  He had a stress test after his heart catheterization in February.  He was having some chest pain and was put on metoprolol. This helped the chest pain but not his breathing.  Breathing is still bad, but not worse.  He has not been treated for a COPD exacerbation since he saw me last.  No episodes of bronchitis.  He completed the six weeks of prednisone.  He said that the daliresp didn't make a difference with his breathing.    Had a UTI. Associated with some incontinence.   Past Medical History  Diagnosis Date  . COPD (chronic obstructive pulmonary disease)   . Carotid stenosis     a. 07/2013 Carotid U/S: 100% RICA (pt prev reported h/o 38%), LICA 38-18%, bilat ECA 50%, normal vertebrals and subclavians bilat-->F/U needed in 01/2014.  [name] Hypertension   . Paroxysmal atrial flutter     a. CHA2DS2VASc = 3 - on eliquis;  b. 07/2013 Echo: EF 50-55%, mildly dil LA.c. s/p ablation 10/31/2013 by Dr Caryl Comes  . Syncope and collapse   . Coronary artery disease     a. 06/1996 & 03/2005 Cath: non-obs dzs;  b. 07/2009 NSTEMI/Cath: LCX 99->PCI/DES extending into OM1;  c. 01/2013 Cath: LM nl, LAD 30p, 4m, LCX 30ost, 20p ISR, 39m, OM1 patent  stent, OM2 30/small, RCA 20/40p, 53m, RPL 75, EF 55%-->Med Rx.; d. cath 03/2014: no evidence of occlusive CAD, patent LCx stent, bifurcation stenosis involving ostium of R AV groove artery, no PCI indicated w/o refractory symptom  . Hyperlipidemia   . Cataract, right     a. 10/2010 s/p cataract surgery   . Shingles     a. 09/2012.  . Pulmonary nodule, right     a. 0.7cm RLL - stable by CT 06/2013.  . Bilateral claudication of lower limb   . SSS (sick sinus syndrome)     a. 12/2009 S/P MDT PPM ([name])  . Hyperkalemia   . Presence of permanent cardiac pacemaker   . Shortness of breath dyspnea   . Asthma   . Myocardial infarction     2003  . History of radiofrequency ablation procedure for cardiac arrhythmia     9/15  . Seizures     last 30 years ago  . Rectal cancer     a. Status post colostomy  . H/O wheezing   . Dysrhythmia      Review of Systems  Constitutional: Positive for fatigue. Negative for fever and chills.  HENT: Negative for postnasal drip, rhinorrhea and sinus pressure.   Respiratory: Positive for shortness  of breath. Negative for cough and wheezing.   Cardiovascular: Negative for chest pain, palpitations and leg swelling.       Objective:   Physical Exam Filed Vitals:   06/12/14 0853  BP: 152/86  Pulse: 66  Height: 5\' 9"  (1.753 m)  Weight: 219 lb (99.338 kg)  SpO2: 98%    RA  Gen: well appearing, no acute distress HEENT: NCAT, EOMi, OP clear PULM: Few rhonchi, no wheezing CV: RRR, no mgr, no JVD AB: BS+, soft, nontender Ext: warm, no edema, no clubbing, no cyanosis Derm: bruising over extensor surfaces of forearms  03/12/2014 left heart catheterization reviewed> moderately elevated LVEDP, patent stents, occlusion noted in a bifurcation in the RCA territory, LVEF 65% 05/10/2014 chest x-ray images personally reviewed there are no pulmonary infiltrates but there is emphysema bilaterally pacemaker in place    Assessment & Plan:   COPD, severe GOLD  GRADE D Donald Juarez tolerated the long course of prednisone fairly well but he did have some insomnia. Fortunately he has not had further exacerbation since we did that. That said, he has very severe COPD and has frequent recurrent exacerbations and continues to be quite symptomatic in terms of exertional dyspnea. I explained to him today that we need to continue to make efforts to prevent these from happening again, to that and I want him to take the Roflumilast. Unfortunately, he has not been taking it because of cost.  Plan: Continue Brovana and Pulmicort Continue Spiriva Roflumilast daily, we will apply for financial assistance I told him to call us in 2 weeks if he has not heard about financial assistance If we cannot get financial assistance for him for the Roflumilast then we will use doxycycline on a one-week basis at the beginning of every month to try to prevent exacerbations Because of his cardiac disease I'm reluctant to use daily azithromycin Follow-up 3 months or sooner if needed   Paroxysmal atrial flutter Normal heart rate today, tolerating metoprolol despite COPD.  Plan Continue metoprolol   Coronary artery disease LHC results reviewed Agree with cardiology management Continue current regimen   Solitary pulmonary nodule Stable imaging, no further imaging needed     Updated Medication List Outpatient Encounter Prescriptions as of 06/12/2014  Medication Sig  . albuterol (PROVENTIL) (2.5 MG/3ML) 0.083% nebulizer solution Take 3 mLs (2.5 mg total) by nebulization every 6 (six) hours as needed for wheezing or shortness of breath. DX code 493.20  . amLODipine (NORVASC) 5 MG tablet TAKE ONE (1) TABLET EACH DAY  . arformoterol (BROVANA) 15 MCG/2ML NEBU Take 2 mLs (15 mcg total) by nebulization 2 (two) times daily. DX code 493.20  . aspirin 81 MG tablet Take 81 mg by mouth daily.  [name] atorvastatin (LIPITOR) 40 MG tablet Take 40 mg by mouth daily.  . budesonide (PULMICORT) 0.25  MG/2ML nebulizer solution Take 0.25 mg by nebulization 2 (two) times daily.  . Cholecalciferol (VITAMIN D) 2000 UNITS tablet Take 2,000 Units by mouth daily.  . fluticasone (FLONASE) 50 MCG/ACT nasal spray Place 1 spray into both nostrils daily.  . furosemide (LASIX) 20 MG tablet Take 1 tablet (20 mg total) by mouth daily. (Patient taking differently: Take 10 mg by mouth daily. )  . isosorbide mononitrate (IMDUR) 60 MG 24 hr tablet Take 1.5 tablets (90 mg total) by mouth daily.  . metoprolol tartrate (LOPRESSOR) 25 MG tablet Take 1 tablet (25 mg total) by mouth 2 (two) times daily.  . nitroGLYCERIN (NITROSTAT) 0.3 MG SL tablet Place 1  tablet (0.3 mg total) under the tongue every 5 (five) minutes as needed for chest pain.  [name] tiotropium (SPIRIVA) 18 MCG inhalation capsule Place 1 capsule (18 mcg total) into inhaler and inhale daily.  . [DISCONTINUED] ciprofloxacin (CIPRO) 500 MG tablet Take 500 mg by mouth 2 (two) times daily.   No facility-administered encounter medications on file as of 06/12/2014.

## 2014-06-12 NOTE — Patient Instructions (Signed)
Keep your inhaled medicine as you are doing If you have not heard from [place] about the [place], then call We will see you back in 3 months or sooner if needed

## 2014-06-18 ENCOUNTER — Encounter: Payer: Self-pay | Admitting: Cardiovascular Disease

## 2014-06-18 ENCOUNTER — Other Ambulatory Visit: Payer: Self-pay

## 2014-06-18 ENCOUNTER — Ambulatory Visit (INDEPENDENT_AMBULATORY_CARE_PROVIDER_SITE_OTHER): Payer: Medicare Other | Admitting: Cardiovascular Disease

## 2014-06-18 VITALS — BP 120/70 | HR 71 | Ht 69.0 in | Wt 217.2 lb

## 2014-06-18 DIAGNOSIS — F418 Other specified anxiety disorders: Secondary | ICD-10-CM | POA: Diagnosis not present

## 2014-06-18 DIAGNOSIS — I6523 Occlusion and stenosis of bilateral carotid arteries: Secondary | ICD-10-CM | POA: Diagnosis not present

## 2014-06-18 DIAGNOSIS — I10 Essential (primary) hypertension: Secondary | ICD-10-CM | POA: Diagnosis not present

## 2014-06-18 DIAGNOSIS — I25118 Atherosclerotic heart disease of native coronary artery with other forms of angina pectoris: Secondary | ICD-10-CM | POA: Diagnosis not present

## 2014-06-18 DIAGNOSIS — F419 Anxiety disorder, unspecified: Secondary | ICD-10-CM

## 2014-06-18 DIAGNOSIS — F329 Major depressive disorder, single episode, unspecified: Secondary | ICD-10-CM | POA: Insufficient documentation

## 2014-06-18 MED ORDER — FLUTICASONE PROPIONATE 50 MCG/ACT NA SUSP
1.0000 | Freq: Every day | NASAL | Status: DC
Start: 2014-06-18 — End: 2014-10-26

## 2014-06-18 NOTE — Assessment & Plan Note (Signed)
He seems to be stable from a cardiac standpoint although he continues to be frustrated. I believe he is on optimal medical therapy. I don't think PCI on the right AV groove artery with of her him great symptomatic improvement. His symptoms seem to be multifactorial with a clear element of anxiety and depression.

## 2014-06-18 NOTE — Assessment & Plan Note (Signed)
Most recent carotid Doppler last month showed chronically occluded right ICA and 60-79% left ICA stenosis. Continue aggressive medical therapy and repeat carotid Doppler in 6-12 months.

## 2014-06-18 NOTE — Patient Instructions (Signed)
Medication Instructions: You can use Benadryl 25-50 mg as needed to help you sleep.   Labwork: None  Procedures/Testing: None  Follow-Up: 6 months with Dr. Fletcher Anon  Any Additional Special Instructions Will Be Listed Below (If Applicable).

## 2014-06-18 NOTE — Assessment & Plan Note (Signed)
He can use Benadryl as a sleeping aid. I advised him to follow-up with his primary care physician and consider treatment with an SSRI.

## 2014-06-18 NOTE — Progress Notes (Signed)
HPI  This is a 69 year old male who is here today for a follow-up visit regarding coronary artery disease. He has known history of coronary artery disease status post stenting of the left circumflex in January 2015, carotid artery stenosis with occlusion on the right side, COPD with previous smoking, atrial flutter status post ablation in October 2015. He is status post pacemaker placement. He had worsening exertional dyspnea and chest pain recently. Cardiac catheterization was done in February  which showed patent left circumflex stents. There was 80% stenosis in the ostial right AV groove artery at the bifurcation of the PDA. This was not an optimal location for PCI and supplied a relatively small area. Thus, I felt that the risk of PCI outweighs the benefit. Left ventricular end-diastolic pressure was 22. I started him on small dose furosemide. He saw Dr. Lake Bells for optimization of his COPD with some improvement. However, he continues to "feel bad"almost on a daily basis. It appears from discussing with him that he has been under stress due to his wife being disabled. He reports having hallucinations in the past but not recently. He admits to being depressed and anxious most of the time. He also has problems with insomnia.  No Known Allergies   Current Outpatient Prescriptions on File Prior to Visit  Medication Sig Dispense Refill  . albuterol (PROVENTIL) (2.5 MG/3ML) 0.083% nebulizer solution Take 3 mLs (2.5 mg total) by nebulization every 6 (six) hours as needed for wheezing or shortness of breath. DX code 493.20 120 mL 3  . amLODipine (NORVASC) 5 MG tablet TAKE ONE (1) TABLET EACH DAY 30 tablet 6  . arformoterol (BROVANA) 15 MCG/2ML NEBU Take 2 mLs (15 mcg total) by nebulization 2 (two) times daily. DX code 493.20    . aspirin 81 MG tablet Take 81 mg by mouth daily.    [name] atorvastatin (LIPITOR) 40 MG tablet Take 40 mg by mouth daily.    . budesonide (PULMICORT) 0.25 MG/2ML nebulizer solution  Take 0.25 mg by nebulization 2 (two) times daily.    . Cholecalciferol (VITAMIN D) 2000 UNITS tablet Take 2,000 Units by mouth daily.    . furosemide (LASIX) 20 MG tablet Take 1 tablet (20 mg total) by mouth daily. (Patient taking differently: Take 10 mg by mouth daily. ) 30 tablet 6  . isosorbide mononitrate (IMDUR) 60 MG 24 hr tablet Take 1.5 tablets (90 mg total) by mouth daily. 30 tablet 6  . metoprolol tartrate (LOPRESSOR) 25 MG tablet Take 1 tablet (25 mg total) by mouth 2 (two) times daily. 60 tablet 6  . nitroGLYCERIN (NITROSTAT) 0.3 MG SL tablet Place 1 tablet (0.3 mg total) under the tongue every 5 (five) minutes as needed for chest pain. 90 tablet 12  . tiotropium (SPIRIVA) 18 MCG inhalation capsule Place 1 capsule (18 mcg total) into inhaler and inhale daily. 30 capsule 11   No current facility-administered medications on file prior to visit.     Past Medical History  Diagnosis Date  . COPD (chronic obstructive pulmonary disease)   . Carotid stenosis     a. 07/2013 Carotid U/S: 100% RICA (pt prev reported h/o 59%), LICA 93-57%, bilat ECA 50%, normal vertebrals and subclavians bilat-->F/U needed in 01/2014.  [name] Hypertension   . Paroxysmal atrial flutter     a. CHA2DS2VASc = 3 - on eliquis;  b. 07/2013 Echo: EF 50-55%, mildly dil LA.c. s/p ablation 10/31/2013 by Dr Caryl Comes  . Syncope and collapse   . Coronary artery disease  a. 06/1996 & 03/2005 Cath: non-obs dzs;  b. 07/2009 NSTEMI/Cath: LCX 99->PCI/DES extending into OM1;  c. 01/2013 Cath: LM nl, LAD 30p, 39m, LCX 30ost, 20p ISR, 71m, OM1 patent stent, OM2 30/small, RCA 20/40p, 93m, RPL 75, EF 55%-->Med Rx.; d. cath 03/2014: no evidence of occlusive CAD, patent LCx stent, bifurcation stenosis involving ostium of R AV groove artery, no PCI indicated w/o refractory symptom  . Hyperlipidemia   . Cataract, right     a. 10/2010 s/p cataract surgery   . Shingles     a. 09/2012.  . Pulmonary nodule, right     a. 0.7cm RLL - stable by CT  06/2013.  . Bilateral claudication of lower limb   . SSS (sick sinus syndrome)     a. 12/2009 S/P MDT PPM ([name])  . Hyperkalemia   . Presence of permanent cardiac pacemaker   . Shortness of breath dyspnea   . Asthma   . Myocardial infarction     2003  . History of radiofrequency ablation procedure for cardiac arrhythmia     9/15  . Seizures     last 30 years ago  . Rectal cancer     a. Status post colostomy  . H/O wheezing   . Dysrhythmia      Past Surgical History  Procedure Laterality Date  . Cardiac surgery  2011    Stents placed   . Cataract surgery  2012  . Colon surgery      cancer removal  . Knee arthroscopy Right 1980's  . Insert / replace / remove pacemaker    . Ablation  10/31/2013    CTI ablation by Dr Caryl Comes for atrial flutter  . Atrial flutter ablation N/A 10/31/2013    Procedure: ATRIAL FLUTTER ABLATION;  Surgeon: [name], MD;  Location: [facility] C[facility] LAB;  Service: Cardiovascular;  Laterality: N/A;  . Cardiac catheterization  07/2009    [facility]  . Cardiac catheterization  01/2013    [facility]  . Cardiac catheterization  03/2014    [facility]  . Coronary angioplasty    . Colon surgery      colostomy  . Cataract extraction w/phaco Left 06/01/2014    Procedure: CATARACT EXTRACTION PHACO AND INTRAOCULAR LENS PLACEMENT (IOC);  Surgeon: [name], MD;  Location: [name] ORS;  Service: Ophthalmology;  Laterality: Left;  . Cataract extraction Left      Family History  Problem Relation Age of Onset  . Cancer Mother     'male cancer"  . Hypertension Mother   . Hyperlipidemia Mother   . Heart disease Father   . Heart attack Father   . Hypertension Father   . Hyperlipidemia Father      History   Social History  . Marital Status: Married    Spouse Name: N/A  . Number of Children: N/A  . Years of Education: N/A   Occupational History  . Not on file.   Social History Main Topics  . Smoking status: Former Smoker -- 1.00 packs/day for 30 years    Types:  Cigarettes    Quit date: 07/26/2008  . Smokeless tobacco: Never Used  . Alcohol Use: No  . Drug Use: No  . Sexual Activity: Not on file   Other Topics Concern  . Not on file   Social History Narrative      PHYSICAL EXAM   BP 120/70 mmHg  Pulse 71  Ht 5\' 9"  (1.753 m)  Wt 217 lb 4 oz (98.544 kg)  BMI  32.07 kg/m2 Constitutional: He is oriented to person, place, and time. He appears well-developed and well-nourished. No distress.  HENT: No nasal discharge.  Head: Normocephalic and atraumatic.  Eyes: Pupils are equal and round.  No discharge. Neck: Normal range of motion. Neck supple. No JVD present. No thyromegaly present.  Cardiovascular: Normal rate, regular rhythm, normal heart sounds. Exam reveals no gallop and no friction rub. No murmur heard.  Pulmonary/Chest: Effort normal and diminished breath sounds . No stridor. No respiratory distress. He has no wheezes. He has no rales. He exhibits no tenderness.  Abdominal: Soft. Bowel sounds are normal. He exhibits no distension. There is no tenderness. There is no rebound and no guarding.  Musculoskeletal: Normal range of motion. He exhibits no edema and no tenderness.  Neurological: He is alert and oriented to person, place, and time. Coordination normal.  Skin: Skin is warm and dry. No rash noted. He is not diaphoretic. No erythema. No pallor.  Psychiatric: He has a normal mood and affect. His behavior is normal. Judgment and thought content normal.        ASSESSMENT AND PLAN

## 2014-06-18 NOTE — Assessment & Plan Note (Signed)
Blood pressure is well controlled on current medications. 

## 2014-07-07 ENCOUNTER — Encounter: Payer: Medicare Other | Admitting: Internal Medicine

## 2014-07-09 ENCOUNTER — Telehealth: Payer: Self-pay

## 2014-07-09 NOTE — Telephone Encounter (Signed)
S/w patient who indicates he passed out twice this week while getting out of a chair.  States he is taking medications as directed. Antibiotic added and flomax recently discontinued. States BP low at PCP office but did not remember what it was. Suggested to patient that he get a BP cuff and take pressures and heart rate while lying, sitting, and standing and to call us with findings. Pt verbalized understanding with no further questions.

## 2014-07-09 NOTE — Telephone Encounter (Signed)
Pt states he is having difficulty standing up and passing out. States he is very dizzy. Pt c/o Syncope: STAT if syncope occurred within 30 minutes and pt complains of lightheadedness High Priority if episode of passing out, completely, today or in last 24 hours   1. Did you pass out today? no  2. When is the last time you passed out? Monday, Wednesday of this week 6/6/ and 6/8  3. Has this occurred multiple times? yes  4. Did you have any symptoms prior to passing out? Dizziness when standing.  5.

## 2014-07-10 ENCOUNTER — Other Ambulatory Visit: Payer: Self-pay

## 2014-07-10 DIAGNOSIS — I10 Essential (primary) hypertension: Secondary | ICD-10-CM

## 2014-07-10 MED ORDER — AMLODIPINE BESYLATE 2.5 MG PO TABS: 2.5000 mg | ORAL_TABLET | Freq: Every day | ORAL | Status: DC

## 2014-07-10 NOTE — Telephone Encounter (Signed)
S/w patient with Dr. Tyrell Antonio recommendations. Labs 6/13 at 9:00am Pt verbalized understanding with no further questions.

## 2014-07-10 NOTE — Telephone Encounter (Signed)
Patient called with BP readings (listed below). Forward to Dr. Fletcher Anon to advise.

## 2014-07-10 NOTE — Telephone Encounter (Signed)
Pt is calling with BP readings 1. Laying down  131/74 hr 71 2. Sitting up 108/66 hr 78 3. Standing up  84/59 hr 84   Please call when we have further instructions for patient.

## 2014-07-10 NOTE — Telephone Encounter (Signed)
Check basic metabolic profile and CBC. Increase fluid intake. Decrease amlodipine to 2.5 mg once daily.

## 2014-07-13 ENCOUNTER — Other Ambulatory Visit (INDEPENDENT_AMBULATORY_CARE_PROVIDER_SITE_OTHER): Payer: Medicare Other

## 2014-07-13 DIAGNOSIS — I10 Essential (primary) hypertension: Secondary | ICD-10-CM | POA: Diagnosis not present

## 2014-07-14 LAB — BASIC METABOLIC PANEL
BUN/Creatinine Ratio: 19 (ref 10–22)
BUN: 26 mg/dL (ref 8–27)
CO2: 20 mmol/L (ref 18–29)
Calcium: 8.7 mg/dL (ref 8.6–10.2)
Chloride: 101 mmol/L (ref 97–108)
Creatinine, Ser: 1.39 mg/dL — ABNORMAL HIGH (ref 0.76–1.27)
GFR calc Af Amer: 59 mL/min/{1.73_m2} — ABNORMAL LOW (ref >59)
GFR calc non Af Amer: 51 mL/min/{1.73_m2} — ABNORMAL LOW (ref >59)
GLUCOSE: 120 mg/dL — AB (ref 65–99)
Potassium: 4.9 mmol/L (ref 3.5–5.2)
Sodium: 138 mmol/L (ref 134–144)

## 2014-07-14 LAB — CBC
HEMOGLOBIN: 12.4 g/dL — AB (ref 12.6–17.7)
Hematocrit: 35.4 % — ABNORMAL LOW (ref 37.5–51.0)
MCH: 30.5 pg (ref 26.6–33.0)
MCHC: 35 g/dL (ref 31.5–35.7)
MCV: 87 fL (ref 79–97)
PLATELETS: 305 10*3/uL (ref 150–379)
RBC: 4.07 x10E6/uL — ABNORMAL LOW (ref 4.14–5.80)
RDW: 13.2 % (ref 12.3–15.4)
WBC: 8.2 10*3/uL (ref 3.4–10.8)

## 2014-07-15 ENCOUNTER — Other Ambulatory Visit: Payer: Self-pay

## 2014-07-28 ENCOUNTER — Ambulatory Visit: Payer: Self-pay

## 2014-07-28 ENCOUNTER — Encounter: Payer: Self-pay | Admitting: Internal Medicine

## 2014-07-28 ENCOUNTER — Ambulatory Visit (INDEPENDENT_AMBULATORY_CARE_PROVIDER_SITE_OTHER): Payer: Medicare Other | Admitting: Internal Medicine

## 2014-07-28 VITALS — BP 95/54 | HR 71 | Ht 69.0 in | Wt 203.2 lb

## 2014-07-28 DIAGNOSIS — Z950 Presence of cardiac pacemaker: Secondary | ICD-10-CM

## 2014-07-28 DIAGNOSIS — I4892 Unspecified atrial flutter: Secondary | ICD-10-CM

## 2014-07-28 LAB — CUP PACEART INCLINIC DEVICE CHECK
Battery Voltage: 2.78 V
Brady Statistic AP VP Percent: 42.1 %
Brady Statistic AP VS Percent: 11.4 %
Brady Statistic AS VP Percent: 11.1 %
Brady Statistic AS VS Percent: 35.4 %
Lead Channel Impedance Value: 429 Ohm
Lead Channel Impedance Value: 589 Ohm
Lead Channel Pacing Threshold Pulse Width: 0.4 ms
Lead Channel Pacing Threshold Pulse Width: 1.5 ms
Lead Channel Setting Pacing Amplitude: 3.5 V
Lead Channel Setting Pacing Pulse Width: 0.4 ms
Lead Channel Setting Sensing Sensitivity: 2.8 mV
MDC IDC MSMT BATTERY IMPEDANCE: 418 Ohm
MDC IDC MSMT LEADCHNL RA PACING THRESHOLD AMPLITUDE: 0.75 V
MDC IDC MSMT LEADCHNL RA SENSING INTR AMPL: 1 mV
MDC IDC MSMT LEADCHNL RV PACING THRESHOLD AMPLITUDE: 0.5 V
MDC IDC MSMT LEADCHNL RV SENSING INTR AMPL: 5.6 mV
MDC IDC SESS DTM: 20160628093515
MDC IDC SET LEADCHNL RV PACING AMPLITUDE: 2 V

## 2014-07-28 NOTE — Progress Notes (Signed)
Patient Care Team: Sofie Hartigan, MD as PCP - General (Family Medicine)   HPI  Donald Juarez is a 69 y.o. male Seen in followup for atrial flutter. He was scheduled for catheter ablation on 9/11. Unfortunately, he came in having failed to take his prior 24 hours of apixaban. The patient procedure was undertaken 10/15. He has prior MDT pacemaker insertion for sinus node dysfunction   When last seen complaing of Chest pain   Cath 2/16  Patent  left circumflex stents. There was 80% stenosis in the ostial right AV groove artery at the bifurcation of the PDA. This was not an optimal location for PCI and supplied a relatively small area Felt that the risk of PCI outweighs the benefit. Left ventricular end-diastolic pressure was 22. Myoview 4/16>> no ischemia EF 55%    he's also had problems with significant lightheadedness and falls. Orthostatic blood pressure recordings that he may get home noted a 30 -35 mm drop in blood pressure from a peak of about 140. He reminds me that he has also been seen in the hospital recently with pain and blood pressures of over 268 systolic.    Previously he had calledThe office with complaints of chest pain subsequent to the above evaluation; he did not get a call back and so he  went to go see Dr. Charlsie Quest at [facility].   Past Medical History  Diagnosis Date  . COPD (chronic obstructive pulmonary disease)   . Carotid stenosis     a. 07/2013 Carotid U/S: 100% RICA (pt prev reported h/o 34%), LICA 19-62%, bilat ECA 50%, normal vertebrals and subclavians bilat-->F/U needed in 01/2014.  [name] Hypertension   . Paroxysmal atrial flutter     a. CHA2DS2VASc = 3 - on eliquis;  b. 07/2013 Echo: EF 50-55%, mildly dil LA.c. s/p ablation 10/31/2013 by Dr Caryl Comes  . Syncope and collapse   . Coronary artery disease     a. 06/1996 & 03/2005 Cath: non-obs dzs;  b. 07/2009 NSTEMI/Cath: LCX 99->PCI/DES extending into OM1;  c. 01/2013 Cath: LM nl, LAD 30p, 59m, LCX 30ost, 20p ISR, 57m, OM1  patent stent, OM2 30/small, RCA 20/40p, 27m, RPL 75, EF 55%-->Med Rx.; d. cath 03/2014: no evidence of occlusive CAD, patent LCx stent, bifurcation stenosis involving ostium of R AV groove artery, no PCI indicated w/o refractory symptom  . Hyperlipidemia   . Cataract, right     a. 10/2010 s/p cataract surgery   . Shingles     a. 09/2012.  . Pulmonary nodule, right     a. 0.7cm RLL - stable by CT 06/2013.  . Bilateral claudication of lower limb   . SSS (sick sinus syndrome)     a. 12/2009 S/P MDT PPM ([name])  . Hyperkalemia   . Presence of permanent cardiac pacemaker   . Shortness of breath dyspnea   . Asthma   . Myocardial infarction     2003  . History of radiofrequency ablation procedure for cardiac arrhythmia     9/15  . Seizures     last 30 years ago  . Rectal cancer     a. Status post colostomy  . H/O wheezing   . Dysrhythmia     Past Surgical History  Procedure Laterality Date  . Cardiac surgery  2011    Stents placed   . Cataract surgery  2012  . Colon surgery      cancer removal  . Knee arthroscopy Right 1980's  .  Insert / replace / remove pacemaker    . Ablation  10/31/2013    CTI ablation by Dr Caryl Comes for atrial flutter  . Atrial flutter ablation N/A 10/31/2013    Procedure: ATRIAL FLUTTER ABLATION;  Surgeon: [name], MD;  Location: [facility] CATH LAB;  Service: Cardiovascular;  Laterality: N/A;  . Cardiac [facility]  07/2009    [facility]  . Cardiac catheter[facility]  01/2013    [facility]  . Cardiac catheterization  03/2014    [facility]  . Coronary angioplasty    . Colon surgery      colostomy  . Cataract extraction w/phaco Left 06/01/2014    Procedure: CATARACT EXTRACTION PHACO AND INTRAOCULAR LENS PLACEMENT (IOC);  Surgeon: [name], MD;  Location: [name] ORS;  Service: Ophthalmology;  Laterality: Left;  . Cataract extraction Left     Current Outpatient Prescriptions  Medication Sig Dispense Refill  . albuterol (PROVENTIL) (2.5 MG/3ML) 0.083% nebulizer solution  Take 3 mLs (2.5 mg total) by nebulization every 6 (six) hours as needed for wheezing or shortness of breath. DX code 493.20 120 mL 3  . amLODipine (NORVASC) 2.5 MG tablet Take 1 tablet (2.5 mg total) by mouth daily. 30 tablet 3  . arformoterol (BROVANA) 15 MCG/2ML NEBU Take 2 mLs (15 mcg total) by nebulization 2 (two) times daily. DX code 493.20    . aspirin 81 MG tablet Take 81 mg by mouth daily.    [name] atorvastatin (LIPITOR) 40 MG tablet Take 40 mg by mouth daily.    . budesonide (PULMICORT) 0.25 MG/2ML nebulizer solution Take 0.25 mg by nebulization 2 (two) times daily.    . Cholecalciferol (VITAMIN D) 2000 UNITS tablet Take 2,000 Units by mouth daily.    . fluticasone (FLONASE) 50 MCG/ACT nasal spray Place 1 spray into both nostrils daily. 16 g 3  . isosorbide mononitrate (IMDUR) 60 MG 24 hr tablet Take 1.5 tablets (90 mg total) by mouth daily. 30 tablet 6  . metoprolol tartrate (LOPRESSOR) 25 MG tablet Take 1 tablet (25 mg total) by mouth 2 (two) times daily. 60 tablet 6  . nitroGLYCERIN (NITROSTAT) 0.3 MG SL tablet Place 1 tablet (0.3 mg total) under the tongue every 5 (five) minutes as needed for chest pain. 90 tablet 12  . tiotropium (SPIRIVA) 18 MCG inhalation capsule Place 1 capsule (18 mcg total) into inhaler and inhale daily. 30 capsule 11   No current facility-administered medications for this visit.    No Known Allergies  Review of Systems negative except from HPI and PMH  Physical Exam BP 95/54 mmHg  Pulse 71  Ht 5\' 9"  (1.753 m)  Wt 203 lb 4 oz (92.194 kg)  BMI 30.00 kg/m2 Well developed and well nourished in no acute distress HENT normal E scleral and icterus clear Neck Supple JVP flat; carotids brisk and full Clear to ausculation Fast and irregular  rate and rhythm, no murmurs gallops or rub Soft with active bowel sounds No clubbing cyanosis Trace Edema Alert and oriented, grossly normal motor and sensory function Skin Warm and Dry  ECG demonstrates atrially paced  rhythm with frequent PVCs  Assessment and  Plan  Atrial flutter-s/p ablation  Sinus node dysfunction  PVCs   Hypertension with orthostatic hypotension  Carotid stenosis  Coronary disease with prior stenting  Increasing chest pain   HFpEF  COPD   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  Blood pressure  Measurements confirm orthostatic hypotension. Interestingly, his ECG demonstrated  atrial failure to capture and he is ventricularly pacing as significant rate. Device interrogation demonstrated increasing atrial pacing threshold with failure to capture. This was reprogrammed allow for intrinsic conduction.   in addition to the above we will abdomen decreases indoor and haven't taken at night. We will have him stop his amlodipine.   he has had problems however with significant systolic hypertension; if this were to recur, treating his systolic hypertension and adding Mestinon for orthostatic hypotension and/or an abdominal binder would be appropriate.   He's had problems with chest discomfort. Catheterization and Myoview data are as outlined above. He was very frustrated that he couldn't get seen here swelling went to go see Dr. Helane Gunther at [facility]. Visit did not go well.   I have spoken to the office manager about struggles with communication

## 2014-07-28 NOTE — Patient Instructions (Signed)
Medication Instructions: - Stop amlodipine (norvasc) - Decrease isosorbide to 60 mg 1/2 tablet (30 mg) once daily at bedtime  Labwork: - none  Procedures/Testing: - none  Follow-Up: - Your physician recommends that you schedule a follow-up appointment in: 3-4 weeks with Dr. Caryl Comes.  Any Additional Special Instructions Will Be Listed Below (If Applicable).

## 2014-07-29 ENCOUNTER — Telehealth: Payer: Self-pay | Admitting: *Deleted

## 2014-07-29 NOTE — Telephone Encounter (Signed)
Pt called back. I gave him instructions how to properly connect his [employer]. Pt could not get his [employer] to start. He changed the batteries in his unit which successfully initiated a transmission. Transmission received successfully.

## 2014-07-29 NOTE — Telephone Encounter (Signed)
Pt left VM on my phone. I returned home phone #. Male answered then hung up on me. I called back immediately. Phone was picked up then hung up again. I left VM on pt's cell # to return my call.

## 2014-08-10 ENCOUNTER — Encounter: Payer: Self-pay | Admitting: Internal Medicine

## 2014-08-18 ENCOUNTER — Encounter: Payer: Self-pay | Admitting: Pulmonary Disease

## 2014-08-18 ENCOUNTER — Ambulatory Visit (INDEPENDENT_AMBULATORY_CARE_PROVIDER_SITE_OTHER): Payer: Medicare Other | Admitting: Pulmonary Disease

## 2014-08-18 VITALS — BP 122/58 | HR 68 | Temp 97.7°F | Ht 69.0 in | Wt 200.6 lb

## 2014-08-18 DIAGNOSIS — J432 Centrilobular emphysema: Secondary | ICD-10-CM | POA: Diagnosis not present

## 2014-08-18 MED ORDER — DOXYCYCLINE HYCLATE 100 MG PO TABS: 100.0000 mg | ORAL_TABLET | Freq: Two times a day (BID) | ORAL | Status: DC

## 2014-08-18 NOTE — Patient Instructions (Signed)
Take the doxycycline as prescribed We will complete the paperwork for your Daliresp and mail it in We will see you back in 2 months or sooner if needed

## 2014-08-18 NOTE — Assessment & Plan Note (Signed)
He is having another mild exacerbation of COPD. He has very severe COPD with recurrent exacerbations. As noted previously this portends a poor prognosis. It is associated with elevated eosinophils.  Unfortunately he has not yet received financial assistance for Roflumilast.  Plan: Continue inhaled therapies Start Roflumilast after financial approval Short course of doxycycline, advised to take with a probiotic and use sunscreen Follow-up 2 months or sooner if needed

## 2014-08-18 NOTE — Progress Notes (Signed)
Subjective:    Patient ID: Donald Juarez, male    DOB: May 22, 1945, 69 y.o.   MRN: [id number]  Synopsis: Donald Juarez first saw the [facility] Pulmonary clinic in 2014 for COPD after smoking 1 PPD for 30 years and quitting in 2010.  He has frequent exacerbations.    03/2013 Full PFT> Ratio 53% FEV1 1.89 (65% pred, 13% change), TLC 5.83 L (93% pred), DLCO 16.6 (69% pred)  HPI Chief Complaint  Patient presents with  . Acute Visit    Cough, lot of clear mucus, especially at night; has been doing great with breathing until now, DOE, sweats, may have had fever. sinus drainage, PND   Donald Juarez has had a rough couple of months.  He had difficulty with incontinence, prostate obstruction.  He has also been dealing with postural hypotension and dizziness. His breathing had been doing well until recently.  He has had more trouble breathing, cough at ngith, and wheezing in the evenings.  This typically starts in the evenings.  He thinks that he has had a fever, no chills.  No sick contacts.  He has been producing more mucus lately. He has just now received the paperwork for the [place].  He he has not started taking it yet.   Past Medical History  Diagnosis Date  . COPD (chronic obstructive pulmonary disease)   . Carotid stenosis     a. 07/2013 Carotid U/S: 100% RICA (pt prev reported h/o 34%), LICA 19-62%, bilat ECA 50%, normal vertebrals and subclavians bilat-->F/U needed in 01/2014.  [name] Hypertension   . Paroxysmal atrial flutter     a. CHA2DS2VASc = 3 - on eliquis;  b. 07/2013 Echo: EF 50-55%, mildly dil LA.c. s/p ablation 10/31/2013 by Dr Caryl Comes  . Syncope and collapse   . Coronary artery disease     a. 06/1996 & 03/2005 Cath: non-obs dzs;  b. 07/2009 NSTEMI/Cath: LCX 99->PCI/DES extending into OM1;  c. 01/2013 Cath: LM nl, LAD 30p, 41m, LCX 30ost, 20p ISR, 73m, OM1 patent stent, OM2 30/small, RCA 20/40p, 35m, RPL 75, EF 55%-->Med Rx.; d. cath 03/2014: no evidence of occlusive CAD, patent LCx stent,  bifurcation stenosis involving ostium of R AV groove artery, no PCI indicated w/o refractory symptom  . Hyperlipidemia   . Cataract, right     a. 10/2010 s/p cataract surgery   . Shingles     a. 09/2012.  . Pulmonary nodule, right     a. 0.7cm RLL - stable by CT 06/2013.  . Bilateral claudication of lower limb   . SSS (sick sinus syndrome)     a. 12/2009 S/P MDT PPM ([name])  . Hyperkalemia   . Presence of permanent cardiac pacemaker   . Shortness of breath dyspnea   . Asthma   . Myocardial infarction     2003  . History of radiofrequency ablation procedure for cardiac arrhythmia     9/15  . Seizures     last 30 years ago  . Rectal cancer     a. Status post colostomy  . H/O wheezing   . Dysrhythmia      Review of Systems  Constitutional: Positive for fatigue. Negative for fever and chills.  HENT: Negative for postnasal drip, rhinorrhea and sinus pressure.   Respiratory: Positive for cough, shortness of breath and wheezing.   Cardiovascular: Negative for chest pain, palpitations and leg swelling.       Objective:   Physical Exam Filed Vitals:   08/18/14 1407  BP:  122/58  Pulse: 68  Temp: 97.7 F (36.5 C)  TempSrc: Oral  Height: 5\' 9"  (1.753 m)  Weight: 200 lb 9.6 oz (90.992 kg)  SpO2: 99%    RA  Gen: well appearing, no acute distress HEENT: NCAT, EOMi, OP clear PULM:  Mild end exp wheeze, good air movement CV: RRR, no mgr, no JVD AB: BS+, soft, nontender Ext: warm, no edema, no clubbing, no cyanosis Derm: bruising over extensor surfaces of forearms  03/12/2014 left heart catheterization reviewed> moderately elevated LVEDP, patent stents, occlusion noted in a bifurcation in the RCA territory, LVEF 65% 05/10/2014 chest x-ray images personally reviewed there are no pulmonary infiltrates but there is emphysema bilaterally pacemaker in place    Assessment & Plan:   COPD, severe GOLD GRADE D He is having another mild exacerbation of COPD. He has very severe  COPD with recurrent exacerbations. As noted previously this portends a poor prognosis. It is associated with elevated eosinophils.  Unfortunately he has not yet received financial assistance for Roflumilast.  Plan: Continue inhaled therapies Start Roflumilast after financial approval Short course of doxycycline, advised to take with a probiotic and use sunscreen Follow-up 2 months or sooner if needed    Updated Medication List Outpatient Encounter Prescriptions as of 08/18/2014  Medication Sig  . albuterol (PROVENTIL) (2.5 MG/3ML) 0.083% nebulizer solution Take 3 mLs (2.5 mg total) by nebulization every 6 (six) hours as needed for wheezing or shortness of breath. DX code 493.20  . arformoterol (BROVANA) 15 MCG/2ML NEBU Take 2 mLs (15 mcg total) by nebulization 2 (two) times daily. DX code 493.20  . aspirin 81 MG tablet Take 81 mg by mouth daily.  [name] atorvastatin (LIPITOR) 40 MG tablet Take 40 mg by mouth daily.  . budesonide (PULMICORT) 0.25 MG/2ML nebulizer solution Take 0.25 mg by nebulization 2 (two) times daily.  . Cholecalciferol (VITAMIN D) 2000 UNITS tablet Take 2,000 Units by mouth daily.  . fluticasone (FLONASE) 50 MCG/ACT nasal spray Place 1 spray into both nostrils daily.  . isosorbide mononitrate (IMDUR) 60 MG 24 hr tablet Take 1/2 tablet (30 mg) at bedtime  . metoprolol tartrate (LOPRESSOR) 25 MG tablet Take 1 tablet (25 mg total) by mouth 2 (two) times daily. (Patient taking differently: Take 12.5 mg by mouth 2 (two) times daily. Take 1/2 tablet (12.5 mg) by mouth twice daily)  . nitroGLYCERIN (NITROSTAT) 0.3 MG SL tablet Place 1 tablet (0.3 mg total) under the tongue every 5 (five) minutes as needed for chest pain.  [name] tiotropium (SPIRIVA) 18 MCG inhalation capsule Place 1 capsule (18 mcg total) into inhaler and inhale daily.  [name] doxycycline (VIBRA-TABS) 100 MG tablet Take 1 tablet (100 mg total) by mouth 2 (two) times daily.   No facility-administered encounter medications on  file as of 08/18/2014.

## 2014-08-19 ENCOUNTER — Encounter: Payer: Self-pay | Admitting: Internal Medicine

## 2014-08-25 ENCOUNTER — Encounter: Payer: Self-pay | Admitting: Internal Medicine

## 2014-08-25 ENCOUNTER — Ambulatory Visit (INDEPENDENT_AMBULATORY_CARE_PROVIDER_SITE_OTHER): Payer: Medicare Other | Admitting: Internal Medicine

## 2014-08-25 VITALS — BP 150/80 | HR 65 | Ht 69.0 in | Wt 200.5 lb

## 2014-08-25 DIAGNOSIS — I4892 Unspecified atrial flutter: Secondary | ICD-10-CM | POA: Diagnosis not present

## 2014-08-25 DIAGNOSIS — R55 Syncope and collapse: Secondary | ICD-10-CM | POA: Diagnosis not present

## 2014-08-25 NOTE — Patient Instructions (Signed)
Medication Instructions:  Your physician recommends that you continue on your current medications as directed. Please refer to the Current Medication list given to you today.   Labwork: none  Testing/Procedures: none  Follow-Up: Your physician recommends that you schedule a follow-up appointment in: three months with Dr. Caryl Comes   Any Other Special Instructions Will Be Listed Below (If Applicable).

## 2014-08-25 NOTE — Progress Notes (Signed)
Patient Care Team: Sofie Hartigan, MD as PCP - General (Family Medicine)   HPI  Donald Juarez is a 69 y.o. male Seen in followup for atrial flutter. He was scheduled for catheter ablation on 9/11. Unfortunately, he came in having failed to take his prior 24 hours of apixaban. The patient procedure was undertaken 10/15. He has prior MDT pacemaker insertion for sinus node dysfunction   He also has problems with systolic hypertension and orthostatic intolerance; Amlodipoine was stopped and imdur decreased   Cath 2/16  Patent  left circumflex stents. There was 80% stenosis in the ostial right AV groove artery at the bifurcation of the PDA. This was not an optimal location for PCI and supplied a relatively small area Felt that the risk of PCI outweighs the benefit. Left ventricular end-diastolic pressure was 22. Myoview 4/16>> no ischemia EF 55%     He has difficult ot manage COPD and is also struggling with prostate obstruction   Past Medical History  Diagnosis Date  . COPD (chronic obstructive pulmonary disease)   . Carotid stenosis     a. 07/2013 Carotid U/S: 100% RICA (pt prev reported h/o 00%), LICA 76-22%, bilat ECA 50%, normal vertebrals and subclavians bilat-->F/U needed in 01/2014.  [name] Hypertension   . Paroxysmal atrial flutter     a. CHA2DS2VASc = 3 - on eliquis;  b. 07/2013 Echo: EF 50-55%, mildly dil LA.c. s/p ablation 10/31/2013 by Dr Caryl Comes  . Syncope and collapse   . Coronary artery disease     a. 06/1996 & 03/2005 Cath: non-obs dzs;  b. 07/2009 NSTEMI/Cath: LCX 99->PCI/DES extending into OM1;  c. 01/2013 Cath: LM nl, LAD 30p, 90m, LCX 30ost, 20p ISR, 5m, OM1 patent stent, OM2 30/small, RCA 20/40p, 62m, RPL 75, EF 55%-->Med Rx.; d. cath 03/2014: no evidence of occlusive CAD, patent LCx stent, bifurcation stenosis involving ostium of R AV groove artery, no PCI indicated w/o refractory symptom  . Hyperlipidemia   . Cataract, right     a. 10/2010 s/p cataract surgery   .  Shingles     a. 09/2012.  . Pulmonary nodule, right     a. 0.7cm RLL - stable by CT 06/2013.  . Bilateral claudication of lower limb   . SSS (sick sinus syndrome)     a. 12/2009 S/P MDT PPM ([name])  . Hyperkalemia   . Presence of permanent cardiac pacemaker   . Shortness of breath dyspnea   . Asthma   . Myocardial infarction     2003  . History of radiofrequency ablation procedure for cardiac arrhythmia     9/15  . Seizures     last 30 years ago  . Rectal cancer     a. Status post colostomy  . H/O wheezing   . Dysrhythmia     Past Surgical History  Procedure Laterality Date  . Cardiac surgery  2011    Stents placed   . Cataract surgery  2012  . Colon surgery      cancer removal  . Knee arthroscopy Right 1980's  . Insert / replace / remove pacemaker    . Ablation  10/31/2013    CTI ablation by Dr Caryl Comes for atrial flutter  . Atrial flutter ablation N/A 10/31/2013    Procedure: ATRIAL FLUTTER ABLATION;  Surgeon: [name], MD;  Location: [facility] CATH LAB;  Service: Cardiovascular;  Laterality: N/A;  . Cardiac [facility]  07/2009    [facility]  . Cardiac  catheterization  01/2013    [facility]  . Cardiac catheterization  03/2014    [facility]  . Coronary angioplasty    . Colon surgery      colostomy  . Cataract extraction w/phaco Left 06/01/2014    Procedure: CATARACT EXTRACTION PHACO AND INTRAOCULAR LENS PLACEMENT (IOC);  Surgeon: [name], MD;  Location: [name] ORS;  Service: Ophthalmology;  Laterality: Left;  . Cataract extraction Left     Current Outpatient Prescriptions  Medication Sig Dispense Refill  . albuterol (PROVENTIL) (2.5 MG/3ML) 0.083% nebulizer solution Take 3 mLs (2.5 mg total) by nebulization every 6 (six) hours as needed for wheezing or shortness of breath. DX code 493.20 120 mL 3  . arformoterol (BROVANA) 15 MCG/2ML NEBU Take 2 mLs (15 mcg total) by nebulization 2 (two) times daily. DX code 493.20    . aspirin 81 MG tablet Take 81 mg by mouth daily.    [name]  atorvastatin (LIPITOR) 40 MG tablet Take 40 mg by mouth daily.    . budesonide (PULMICORT) 0.25 MG/2ML nebulizer solution Take 0.25 mg by nebulization 2 (two) times daily.    . Cholecalciferol (VITAMIN D) 2000 UNITS tablet Take 2,000 Units by mouth daily.    . fluticasone (FLONASE) 50 MCG/ACT nasal spray Place 1 spray into both nostrils daily. 16 g 3  . isosorbide mononitrate (IMDUR) 60 MG 24 hr tablet Take 1/2 tablet (30 mg) at bedtime    . metoprolol tartrate (LOPRESSOR) 25 MG tablet Take 1 tablet (25 mg total) by mouth 2 (two) times daily. (Patient taking differently: Take 12.5 mg by mouth 2 (two) times daily. Take 1/2 tablet (12.5 mg) by mouth twice daily) 60 tablet 6  . nitroGLYCERIN (NITROSTAT) 0.3 MG SL tablet Place 1 tablet (0.3 mg total) under the tongue every 5 (five) minutes as needed for chest pain. 90 tablet 12  . tiotropium (SPIRIVA) 18 MCG inhalation capsule Place 1 capsule (18 mcg total) into inhaler and inhale daily. 30 capsule 11   No current facility-administered medications for this visit.    No Known Allergies  Review of Systems negative except from HPI and PMH  Physical Exam BP 150/80 mmHg  Pulse 65  Ht 5\' 9"  (1.753 m)  Wt 90.946 kg (200 lb 8 oz)  BMI 29.60 kg/m2 Well developed and well nourished in no acute distress HENT normal E scleral and icterus clear Neck Supple JVP flat; carotids brisk and full Clear to ausculation Fast and irregular  rate and rhythm, no murmurs gallops or rub Soft with active bowel sounds No clubbing cyanosis no Edema Alert and oriented, grossly normal motor and sensory function Skin Warm and Dry  ECG demonstrates atrially paced rhythm with frequent PVCs  Assessment and  Plan  Atrial flutter-s/p ablation  Sinus node dysfunction  PVCs   Hypertension with orthostatic hypotension  Carotid stenosis  Coronary disease with prior stenting   Atypical CP  HFpEF  COPD   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

## 2014-08-26 ENCOUNTER — Telehealth: Payer: Self-pay | Admitting: Pulmonary Disease

## 2014-08-26 MED ORDER — PREDNISONE 10 MG PO TABS: ORAL_TABLET | ORAL | Status: DC

## 2014-08-26 NOTE — Telephone Encounter (Signed)
Called pt and is aware of recs. RX sent in. Nothing further needed 

## 2014-08-26 NOTE — Telephone Encounter (Signed)
Pt states he finished Doxycycline 5 days ago.  C/o sob worse in am and late afternoons, nasal congestion, prod cough (clear).  Denies chest discomfort.  Pt is requesting rx for Prednisone.  He states Dr Lake Bells had mentioned this at last ov.  Please advise.

## 2014-08-26 NOTE — Telephone Encounter (Signed)
Prednisone: Take 40mg po daily for 3 days, then take 30mg po daily for 3 days, then take 20mg po daily for two days, then take 10mg po daily for 2 days  

## 2014-09-09 ENCOUNTER — Encounter: Payer: Self-pay | Admitting: Internal Medicine

## 2014-10-07 ENCOUNTER — Telehealth: Payer: Self-pay | Admitting: Internal Medicine

## 2014-10-07 NOTE — Telephone Encounter (Signed)
Please call patient

## 2014-10-07 NOTE — Telephone Encounter (Signed)
Patient wants Dr. Caryl Comes to know he did not do well on biopsy for cancer and he wants to see Dr.  Caryl Comes asap to discuss fitness to carry on with cancer surgery.  Patient is scheduled 11-10-14 with Dr. Caryl Comes but he wants to know if he needs to check defib as well.    Patient appt will need to be changed from off visit to phypacer chk .   Let me know which and I will change it    THANKS!

## 2014-10-08 NOTE — Telephone Encounter (Signed)
S/w Melissa who states Dr. Caryl Comes had cancellation 9/13, 10:30am [place].  S/w pt who is agreeable to Sept 13 in [place]. Pt verbalized understanding with no further questions.

## 2014-10-08 NOTE — Telephone Encounter (Signed)
S/w pt wife, Blanch Media, who states they are in the car going to [facility] for bone scan and pt is driving but can hear pt responses. Per wife/husband, pt would like to be seen by Dr. Caryl Comes sooner than scheduled Oct 11 appt. Willing to drive to [place] office to be seen. I will call scheduling and call pt back Pt verbalized understanding with no further questions.   Left message for Melissa in scheduling

## 2014-10-13 ENCOUNTER — Ambulatory Visit (INDEPENDENT_AMBULATORY_CARE_PROVIDER_SITE_OTHER): Payer: Medicare Other | Admitting: Internal Medicine

## 2014-10-13 ENCOUNTER — Encounter: Payer: Self-pay | Admitting: Internal Medicine

## 2014-10-13 VITALS — BP 146/85 | HR 71 | Ht 69.0 in | Wt 208.0 lb

## 2014-10-13 DIAGNOSIS — I483 Typical atrial flutter: Secondary | ICD-10-CM

## 2014-10-13 DIAGNOSIS — Z950 Presence of cardiac pacemaker: Secondary | ICD-10-CM

## 2014-10-13 DIAGNOSIS — Z01810 Encounter for preprocedural cardiovascular examination: Secondary | ICD-10-CM | POA: Diagnosis not present

## 2014-10-13 DIAGNOSIS — I25118 Atherosclerotic heart disease of native coronary artery with other forms of angina pectoris: Secondary | ICD-10-CM

## 2014-10-13 LAB — CUP PACEART INCLINIC DEVICE CHECK
Battery Impedance: 543 Ohm
Brady Statistic AP VP Percent: 1 %
Brady Statistic AP VS Percent: 67 %
Brady Statistic AS VS Percent: 31 %
Lead Channel Impedance Value: 477 Ohm
Lead Channel Impedance Value: 515 Ohm
Lead Channel Pacing Threshold Amplitude: 0.5 V
Lead Channel Pacing Threshold Pulse Width: 1.5 ms
Lead Channel Setting Pacing Amplitude: 3.5 V
Lead Channel Setting Pacing Pulse Width: 0.4 ms
MDC IDC MSMT BATTERY REMAINING LONGEVITY: 47 mo
MDC IDC MSMT BATTERY VOLTAGE: 2.77 V
MDC IDC MSMT LEADCHNL RA PACING THRESHOLD AMPLITUDE: 2 V
MDC IDC MSMT LEADCHNL RA SENSING INTR AMPL: 1 mV
MDC IDC MSMT LEADCHNL RV PACING THRESHOLD PULSEWIDTH: 0.4 ms
MDC IDC SESS DTM: 20160913133458
MDC IDC SET LEADCHNL RV PACING AMPLITUDE: 2 V
MDC IDC SET LEADCHNL RV SENSING SENSITIVITY: 2.8 mV
MDC IDC STAT BRADY AS VP PERCENT: 1 %

## 2014-10-13 NOTE — Patient Instructions (Signed)
Medication Instructions: - no changes  Labwork: - none  Procedures/Testing: - none  Follow-Up: - Your physician wants you to follow-up in: 4 months in the [place] office with Dr. Caryl Comes. You will receive a reminder letter in the mail two months in advance. If you don't receive a letter, please call our office to schedule the follow-up appointment.  Any Additional Special Instructions Will Be Listed Below (If Applicable).

## 2014-10-13 NOTE — Progress Notes (Signed)
Patient Care Team: Sofie Hartigan, MD as PCP - General (Family Medicine)   HPI  Donald Juarez is a 69 y.o. male Seen in followup for atrial flutter. He was scheduled for catheter ablation on 9/11. Unfortunately, he came in having failed to take his prior 24 hours of apixaban. The patient procedure was undertaken 10/15. He has prior MDT pacemaker insertion for sinus node dysfunction   He also has problems with systolic hypertension and orthostatic intolerance; Amlodipoine was stopped and imdur decreased   Cath 2/16  Patent  left circumflex stents. There was 80% stenosis in the ostial right AV groove artery at the bifurcation of the PDA. This was not an optimal location for PCI and supplied a relatively small area Felt that the risk of PCI outweighs the benefit. Left ventricular end-diastolic pressure was 22. Myoview 4/16>> no ischemia EF 55%     He has difficult ot manage COPD   His prostate problem has been identified as prostate cancer; with history of radiation for his rectal cancer, he is not a candidate for that. questions currently as to whether he would be a candidate for surgery or hormone therapy    Past Medical History  Diagnosis Date  . COPD (chronic obstructive pulmonary disease)   . Carotid stenosis     a. 07/2013 Carotid U/S: 100% RICA (pt prev reported h/o 32%), LICA 67-12%, bilat ECA 50%, normal vertebrals and subclavians bilat-->F/U needed in 01/2014.  [name] Hypertension   . Paroxysmal atrial flutter     a. CHA2DS2VASc = 3 - on eliquis;  b. 07/2013 Echo: EF 50-55%, mildly dil LA.c. s/p ablation 10/31/2013 by Dr Caryl Comes  . Syncope and collapse   . Coronary artery disease     a. 06/1996 & 03/2005 Cath: non-obs dzs;  b. 07/2009 NSTEMI/Cath: LCX 99->PCI/DES extending into OM1;  c. 01/2013 Cath: LM nl, LAD 30p, 58m, LCX 30ost, 20p ISR, 30m, OM1 patent stent, OM2 30/small, RCA 20/40p, 78m, RPL 75, EF 55%-->Med Rx.; d. cath 03/2014: no evidence of occlusive CAD, patent LCx  stent, bifurcation stenosis involving ostium of R AV groove artery, no PCI indicated w/o refractory symptom  . Hyperlipidemia   . Cataract, right     a. 10/2010 s/p cataract surgery   . Shingles     a. 09/2012.  . Pulmonary nodule, right     a. 0.7cm RLL - stable by CT 06/2013.  . Bilateral claudication of lower limb   . SSS (sick sinus syndrome)     a. 12/2009 S/P MDT PPM ([name])  . Hyperkalemia   . Presence of permanent cardiac pacemaker   . Shortness of breath dyspnea   . Asthma   . Myocardial infarction     2003  . History of radiofrequency ablation procedure for cardiac arrhythmia     9/15  . Seizures     last 30 years ago  . Rectal cancer     a. Status post colostomy  . H/O wheezing   . Dysrhythmia     Past Surgical History  Procedure Laterality Date  . Cardiac surgery  2011    Stents placed   . Cataract surgery  2012  . Colon surgery      cancer removal  . Knee arthroscopy Right 1980's  . Insert / replace / remove pacemaker    . Ablation  10/31/2013    CTI ablation by Dr Caryl Comes for atrial flutter  . Atrial flutter ablation N/A 10/31/2013  Procedure: ATRIAL FLUTTER ABLATION;  Surgeon: [name], MD;  Location: [facility] CATH LAB;  Service: Cardiovascular;  Laterality: N/A;  . Cardiac catheterization  07/2009    [facility]  . Cardiac catheterization  01/2013    [facility]  . Cardiac catheterization  03/2014    [facility]  . Coronary angioplasty    . Colon surgery      colostomy  . Cataract extraction w/phaco Left 06/01/2014    Procedure: CATARACT EXTRACTION PHACO AND INTRAOCULAR LENS PLACEMENT (IOC);  Surgeon: [name], MD;  Location: [name] ORS;  Service: Ophthalmology;  Laterality: Left;  . Cataract extraction Left     Current Outpatient Prescriptions  Medication Sig Dispense Refill  . albuterol (PROVENTIL) (2.5 MG/3ML) 0.083% nebulizer solution Take 3 mLs (2.5 mg total) by nebulization every 6 (six) hours as needed for wheezing or shortness of breath. DX code 493.20 120  mL 3  . arformoterol (BROVANA) 15 MCG/2ML NEBU Take 2 mLs (15 mcg total) by nebulization 2 (two) times daily. DX code 493.20    . aspirin 81 MG tablet Take 81 mg by mouth daily.    [name] atorvastatin (LIPITOR) 40 MG tablet Take 40 mg by mouth daily.    . budesonide (PULMICORT) 0.25 MG/2ML nebulizer solution Take 0.25 mg by nebulization 2 (two) times daily.    . Cholecalciferol (VITAMIN D) 2000 UNITS tablet Take 2,000 Units by mouth daily.    . fluticasone (FLONASE) 50 MCG/ACT nasal spray Place 1 spray into both nostrils daily. 16 g 3  . isosorbide mononitrate (IMDUR) 60 MG 24 hr tablet Take 1/2 tablet (30 mg) at bedtime    . levofloxacin (LEVAQUIN) 500 MG tablet Take 500 mg by mouth daily. Take 1 tablet daily for 10 days    . metoprolol tartrate (LOPRESSOR) 25 MG tablet Take 1 tablet (25 mg total) by mouth 2 (two) times daily. (Patient taking differently: Take 12.5 mg by mouth 2 (two) times daily. Take 1/2 tablet (12.5 mg) by mouth twice daily) 60 tablet 6  . nitroGLYCERIN (NITROSTAT) 0.3 MG SL tablet Place 1 tablet (0.3 mg total) under the tongue every 5 (five) minutes as needed for chest pain. 90 tablet 12  . nystatin cream (MYCOSTATIN) Use as directed by doctor    . predniSONE (DELTASONE) 10 MG tablet Take 40mg  po daily for 3 days, then take 30mg  po daily for 3 days, then take 20mg  po daily for two days, then take 10mg  po daily for 2 days 27 tablet 0  . tiotropium (SPIRIVA) 18 MCG inhalation capsule Place 1 capsule (18 mcg total) into inhaler and inhale daily. 30 capsule 11  . traMADol (ULTRAM) 50 MG tablet Take 50 mg by mouth daily as needed.     No current facility-administered medications for this visit.    No Known Allergies  Review of Systems negative except from HPI and PMH  Physical Exam BP 146/85 mmHg  Pulse 71  Ht 5\' 9"  (1.753 m)  Wt 208 lb (94.348 kg)  BMI 30.70 kg/m2 Well developed and well nourished in no acute distress HENT normal E scleral and icterus clear Neck  Supple JVP flat; carotids brisk and full Clear to ausculation Fast and irregular  rate and rhythm, no murmurs gallops or rub Soft with active bowel sounds No clubbing cyanosis no Edema Alert and oriented, grossly normal motor and sensory function Skin Warm and Dry  ECG demonstrates atrially paced rhythm with frequent PVCs  Assessment and  Plan  Atrial flutter-s/p ablation  Sinus node  dysfunction  PVCs   Hypertension with orthostatic hypotension  Carotid stenosis  Coronary disease with prior stenting   Atypical CP  HFpEF  COPD  prostsate Cancer   From a cardiovascular point of view, I think his risks for surgery would be acceptable.  Orthostasis is much improved  Blood pressure is reasonable  Without symptoms of ischemia \ Euvolemic continue current meds

## 2014-10-19 ENCOUNTER — Telehealth: Payer: Self-pay | Admitting: Pulmonary Disease

## 2014-10-19 ENCOUNTER — Ambulatory Visit: Payer: Medicare Other | Admitting: Pulmonary Disease

## 2014-10-19 DIAGNOSIS — J432 Centrilobular emphysema: Secondary | ICD-10-CM

## 2014-10-19 NOTE — Telephone Encounter (Signed)
Spoke with pt. States that his current nebulizer machine is causing him issues. Would like a new machine from [place]. Order has been placed. Nothing further was needed at this time.

## 2014-10-26 ENCOUNTER — Telehealth: Payer: Self-pay | Admitting: *Deleted

## 2014-10-26 ENCOUNTER — Other Ambulatory Visit: Payer: Self-pay | Admitting: Physician Assistant

## 2014-10-26 ENCOUNTER — Ambulatory Visit (INDEPENDENT_AMBULATORY_CARE_PROVIDER_SITE_OTHER): Payer: Medicare Other | Admitting: Physician Assistant

## 2014-10-26 ENCOUNTER — Encounter: Payer: Self-pay | Admitting: Physician Assistant

## 2014-10-26 VITALS — BP 160/80 | HR 63 | Ht 69.0 in | Wt 208.2 lb

## 2014-10-26 DIAGNOSIS — F32A Depression, unspecified: Secondary | ICD-10-CM

## 2014-10-26 DIAGNOSIS — I25110 Atherosclerotic heart disease of native coronary artery with unstable angina pectoris: Secondary | ICD-10-CM | POA: Diagnosis not present

## 2014-10-26 DIAGNOSIS — I483 Typical atrial flutter: Secondary | ICD-10-CM

## 2014-10-26 DIAGNOSIS — Z01812 Encounter for preprocedural laboratory examination: Secondary | ICD-10-CM | POA: Diagnosis not present

## 2014-10-26 DIAGNOSIS — R0602 Shortness of breath: Secondary | ICD-10-CM | POA: Diagnosis not present

## 2014-10-26 DIAGNOSIS — R079 Chest pain, unspecified: Secondary | ICD-10-CM

## 2014-10-26 DIAGNOSIS — I739 Peripheral vascular disease, unspecified: Secondary | ICD-10-CM

## 2014-10-26 DIAGNOSIS — I6523 Occlusion and stenosis of bilateral carotid arteries: Secondary | ICD-10-CM

## 2014-10-26 DIAGNOSIS — F419 Anxiety disorder, unspecified: Secondary | ICD-10-CM

## 2014-10-26 DIAGNOSIS — R5383 Other fatigue: Secondary | ICD-10-CM

## 2014-10-26 DIAGNOSIS — I959 Hypotension, unspecified: Secondary | ICD-10-CM

## 2014-10-26 DIAGNOSIS — F418 Other specified anxiety disorders: Secondary | ICD-10-CM

## 2014-10-26 DIAGNOSIS — J432 Centrilobular emphysema: Secondary | ICD-10-CM

## 2014-10-26 DIAGNOSIS — C61 Malignant neoplasm of prostate: Secondary | ICD-10-CM

## 2014-10-26 DIAGNOSIS — F329 Major depressive disorder, single episode, unspecified: Secondary | ICD-10-CM

## 2014-10-26 DIAGNOSIS — I10 Essential (primary) hypertension: Secondary | ICD-10-CM

## 2014-10-26 MED ORDER — ISOSORBIDE MONONITRATE ER 60 MG PO TB24: 30.0000 mg | ORAL_TABLET | Freq: Three times a day (TID) | ORAL | Status: DC

## 2014-10-26 NOTE — Patient Instructions (Addendum)
Medication Instructions:  Your physician has recommended you make the following change in your medication:  INCREASE imdur to 30mg  three times a day   Labwork: Your physician recommends that you have labs today: BMET, CBC, PT/INR   Testing/Procedures: Your physician has requested that you have a cardiac catheterization. Cardiac catheterization is used to diagnose and/or treat various heart conditions. Doctors may recommend this procedure for a number of different reasons. The most common reason is to evaluate chest pain. Chest pain can be a symptom of coronary artery disease (CAD), and cardiac catheterization can show whether plaque is narrowing or blocking your heart's arteries. This procedure is also used to evaluate the valves, as well as measure the blood flow and oxygen levels in different parts of your heart. For further information please visit [email or web address]. Please follow instruction sheet, as given.  [facility] Cardiac Cath Instructions   You are scheduled for a Cardiac Cath on: October 3, 12:30pm  Please arrive at 11:30am on the day of your procedure  Do not eat/drink anything after midnight  Someone will need to drive you home  It is recommended someone be with you for the first 24 hours after your procedure  Wear clothes that are easy to get on/off and wear slip on shoes if possible   Medications bring a current list of all medications with you  __xx_ You may take all of your medications the morning of your procedure with enough water to swallow safely    Day of your procedure: Arrive at the [facility] entrance.  Free valet service is available.  After entering the [facility] please check-in at the registration desk (1st desk on your right) to receive your armband. After receiving your armband someone will escort you to the cardiac cath/special procedures waiting area.  The usual length of stay after your procedure is about 2 to 3 hours.  This can vary.  If you  have any questions, please call our office at [phone], or you may call the cardiac cath lab at [facility] directly at [phone]   Follow-Up: Your physician recommends that you schedule a follow-up appointment after your cath.    Any Other Special Instructions Will Be Listed Below (If Applicable).  Angiogram An angiogram, also called angiography, is a procedure used to look at the blood vessels that carry blood to different parts of your body (arteries). In this procedure, dye is injected through a long, thin tube (catheter) into an artery. X-rays are then taken. The X-rays will show if there is a blockage or problem in a blood vessel.  LET [facility] CARE PROVIDER KNOW ABOUT:  Any allergies you have, including allergies to shellfish or contrast dye.   All medicines you are taking, including vitamins, herbs, eye drops, creams, and over-the-counter medicines.   Previous problems you or members of your family have had with the use of anesthetics.   Any blood disorders you have.   Previous surgeries you have had.  Any previous kidney problems or failure you have had.  Medical conditions you have.   Possibility of pregnancy, if this applies. RISKS AND COMPLICATIONS Generally, an angiogram is a safe procedure. However, as with any procedure, problems can occur. Possible problems include:  Injury to the blood vessels, including rupture or bleeding.  Infection or bruising at the catheter site.  Allergic reaction to the dye or contrast used.  Kidney damage from the dye or contrast used.  Blood clots that can lead to a stroke or heart attack.  BEFORE THE PROCEDURE  Do not eat or drink after midnight on the night before the procedure, or as directed by your health care provider.   Ask your health care provider if you may drink enough water to take any needed medicines the morning of the procedure.  PROCEDURE  You may be given a medicine to help you relax (sedative)  before and during the procedure. This medicine is given through an IV access tube that is inserted into one of your veins.   The area where the catheter will be inserted will be washed and shaved. This is usually done in the groin but may be done in the fold of your arm (near your elbow) or in the wrist.  A medicine will be given to numb the area where the catheter will be inserted (local anesthetic).  The catheter will be inserted with a guide wire into an artery. The catheter is guided by using a type of X-ray (fluoroscopy) to the blood vessel being examined.   Dye is then injected into the catheter, and X-rays are taken. The dye helps to show where any narrowing or blockages are located.  AFTER THE PROCEDURE   If the procedure is done through the leg, you will be kept in bed lying flat for several hours. You will be instructed to not bend or cross your legs.  The insertion site will be checked frequently.  The pulse in your feet or wrist will be checked frequently.  Additional blood tests, X-rays, and electrocardiography may be done.   You may need to stay in the hospital overnight for observation.  Document Released: 10/26/2004 Document Revised: 01/21/2013 Document Reviewed: 06/19/2012 [facility] Patient Information 2015 [place], [place]. This information is not intended to replace advice given to you by your health care provider. Make sure you discuss any questions you have with your health care provider.

## 2014-10-26 NOTE — Progress Notes (Signed)
Cardiology Office Note:  Date of Encounter: 10/26/2014  ID: Donald Juarez, DOB [birth year], MRN [id number]  PCP:  Sofie Hartigan, MD Primary Cardiologist:  Dr. Fletcher Anon, MD Primary Electrophysiologist: Dr. Caryl Comes, MD  Chief Complaint  Patient presents with  . other    Pt. c/o last Tuesday BP elevated to 220/173 with numbness in his jaw; checked at home with his BP cuff, has shortness of breath, chest pain.      HPI:  69 year old male with history of CAD with most recent stenting to LCx in 2011 in the setting of NSTEMI, paroxysmal atrial flutter with prior radiofrequency ablation on 10/31/2013, difficult to manage COPD, systolic HTN, orthostatic intolerance, carotid stenosis chronically occluded on the right side, prior syncope and collapse, SSS s/p MDT PPM in 12/2009, asthma with chronic SOB, rectal cancer s/p post colostomy, and recently diagnosed prostate cancer who presents as a work in today for increased blood pressure and SOB over the past week.   Patient most recently underwent cardiac cath 03/2014 in the setting of worsening exertional dyspnea and chest pain. Cath showed patent LCx stents, 80% stenosis in the ostial right AV grove artery at the bifurcation of the PDA. This was not an optimal location for PCI and supplied a relatively small area. Thus, it was felt that the risk of PCI outweighed the benefit. LVEDP was 22. He was started on low dose Lasix and seen by Dr. Lake Bells for optimization of his COPD with some improvement. However, he continued to feel bad almost on a daily basis. He had been under a signifianct amount of stress due to his wife being disabled. He underwent Myoview in 05/2014 that showed no ischemia with an EF of 55%.   Recently seen by Dr. Caryl Comes, MD 10/13/2014 for cardiac clearance secondary to his recently diagnosed prostate cancer. He was cleared for surgery at that time. His BP was 146/85. He was euvolemic as well. His orthostasis had much improved.   He  previously amitted to depression and anxiety.   He called his pulmonologist's office on 9/19 stating his nebulizer machine had been causing him some issues. An order was placed for a new one.   He called our office on 9/26 stating he was SOB the week prior and that his BP was elevated around 220/173, with associated left sided chest pain. He took a SL NTG and both his chest pain and BP improved. On 9/24 his BP was 175/80. This morning his BP was in the 001'V systolic. He has self titrated his metoprolol initially from 12.5 mg bid to 12.5 mg q AM to 25 mg q PM, then to 25 mg bid and his Imdur from 30 mg qhs to 30 mg bid. He reports he has not been "feeling well," stating he feels like he was "hit by a truck." He has not had any further chest pain since the episode on 9/20, though he continues to feel tried and not quite like himself. BP typically runs in the 494'W systolic. The above SL NTG was the only one he has had to take, and only the third one he has ever taken. He does have a lot on his plate currently as he will be sees the urologist on 9/30 for discussion on the management of his recently diagnosed prostate cancer. He is currently chest pain free.     Past Medical History  Diagnosis Date  . COPD (chronic obstructive pulmonary disease)   . Carotid stenosis  a. 07/2013 Carotid U/S: 100% RICA (pt prev reported h/o 25%), LICA 42-70%, bilat ECA 50%, normal vertebrals and subclavians bilat-->F/U needed in 01/2014.  [name] Hypertension   . Paroxysmal atrial flutter     a. CHA2DS2VASc = 3 - on eliquis;  b. 07/2013 Echo: EF 50-55%, mildly dil LA.c. s/p ablation 10/31/2013 by Dr Caryl Comes  . Syncope and collapse   . Coronary artery disease     a. 06/1996 & 03/2005 Cath: non-obs dzs;  b. 07/2009 NSTEMI/Cath: LCX 99->PCI/DES extending into OM1;  c. 01/2013 Cath: LM nl, LAD 30p, 33m, LCX 30ost, 20p ISR, 67m, OM1 patent stent, OM2 30/small, RCA 20/40p, 20m, RPL 75, EF 55%-->Med Rx.; d. cath 03/2014: no evidence of  occlusive CAD, patent LCx stent, bifurcation stenosis involving ostium of R AV groove artery, no PCI indicated w/o refractory symptom  . Hyperlipidemia   . Cataract, right     a. 10/2010 s/p cataract surgery   . Shingles     a. 09/2012.  . Pulmonary nodule, right     a. 0.7cm RLL - stable by CT 06/2013.  . Bilateral claudication of lower limb   . SSS (sick sinus syndrome)     a. 12/2009 S/P MDT PPM ([name])  . Hyperkalemia   . Presence of permanent cardiac pacemaker   . Shortness of breath dyspnea   . Asthma   . Myocardial infarction     2003  . History of radiofrequency ablation procedure for cardiac arrhythmia     9/15  . Seizures     last 30 years ago  . Rectal cancer     a. Status post colostomy  . H/O wheezing   . Dysrhythmia   :  Past Surgical History  Procedure Laterality Date  . Cardiac surgery  2011    Stents placed   . Cataract surgery  2012  . Colon surgery      cancer removal  . Knee arthroscopy Right 1980's  . Insert / replace / remove pacemaker    . Ablation  10/31/2013    CTI ablation by Dr Caryl Comes for atrial flutter  . Atrial flutter ablation N/A 10/31/2013    Procedure: ATRIAL FLUTTER ABLATION;  Surgeon: [name], MD;  Location: [facility] CATH LAB;  Service: Cardiovascular;  Laterality: N/A;  . Cardiac catheterization  07/2009    [facility]  . Cardiac catheterization  01/2013    [facility]  . Cardiac catheterization  03/2014    [facility]  . Coronary angioplasty    . Colon surgery      colostomy  . Cataract extraction w/phaco Left 06/01/2014    Procedure: CATARACT EXTRACTION PHACO AND INTRAOCULAR LENS PLACEMENT (IOC);  Surgeon: [name], MD;  Location: [name] ORS;  Service: Ophthalmology;  Laterality: Left;  . Cataract extraction Left   :  Social History:  The patient  reports that he quit smoking about 6 years ago. His smoking use included Cigarettes. He has a 30 pack-year smoking history. He has never used smokeless tobacco. He reports that he does not drink  alcohol or use illicit drugs.   Family History  Problem Relation Age of Onset  . Cancer Mother     'male cancer"  . Hypertension Mother   . Hyperlipidemia Mother   . Heart disease Father   . Heart attack Father   . Hypertension Father   . Hyperlipidemia Father      Allergies:  No Known Allergies   Home Medications:  Current Outpatient Prescriptions  Medication Sig  Dispense Refill  . albuterol (PROVENTIL) (2.5 MG/3ML) 0.083% nebulizer solution Take 3 mLs (2.5 mg total) by nebulization every 6 (six) hours as needed for wheezing or shortness of breath. DX code 493.20 120 mL 3  . arformoterol (BROVANA) 15 MCG/2ML NEBU Take 2 mLs (15 mcg total) by nebulization 2 (two) times daily. DX code 493.20    . aspirin 81 MG tablet Take 81 mg by mouth daily.    [name] atorvastatin (LIPITOR) 40 MG tablet Take 40 mg by mouth daily.    . budesonide (PULMICORT) 0.25 MG/2ML nebulizer solution Take 0.25 mg by nebulization 2 (two) times daily.    . Cholecalciferol (VITAMIN D) 2000 UNITS tablet Take 2,000 Units by mouth daily.    . metoprolol tartrate (LOPRESSOR) 25 MG tablet Take 1 tablet (25 mg total) by mouth 2 (two) times daily. (Patient taking differently: Take 12.5 mg by mouth 2 (two) times daily. Take 1/2 tablet (12.5 mg) by mouth twice daily) 60 tablet 6  . nitroGLYCERIN (NITROSTAT) 0.3 MG SL tablet Place 1 tablet (0.3 mg total) under the tongue every 5 (five) minutes as needed for chest pain. 90 tablet 12  . nystatin cream (MYCOSTATIN) Use as directed by doctor    . tiotropium (SPIRIVA) 18 MCG inhalation capsule Place 1 capsule (18 mcg total) into inhaler and inhale daily. 30 capsule 11  . traMADol (ULTRAM) 50 MG tablet Take 50 mg by mouth daily as needed.    . isosorbide mononitrate (IMDUR) 60 MG 24 hr tablet Take 0.5 tablets (30 mg total) by mouth 3 (three) times daily. 45 tablet 3   No current facility-administered medications for this visit.     Review of Systems:  Review of Systems    Constitutional: Positive for malaise/fatigue. Negative for fever, chills, weight loss and diaphoresis.  HENT: Negative for congestion.   Eyes: Negative for discharge and redness.  Respiratory: Positive for shortness of breath. Negative for cough, hemoptysis, sputum production and wheezing.   Cardiovascular: Positive for chest pain and claudication. Negative for palpitations, orthopnea, leg swelling and PND.       No chest pain since 9/20  Gastrointestinal: Negative for heartburn, nausea and vomiting.  Musculoskeletal: Negative for falls.  Skin: Negative for rash.  Neurological: Positive for weakness. Negative for dizziness, tingling, tremors, sensory change, speech change and focal weakness.  Endo/Heme/Allergies: Does not bruise/bleed easily.  Psychiatric/Behavioral: The patient is nervous/anxious.      Physical Exam:  Blood pressure 160/80, pulse 63, height 5\' 9"  (1.753 m), weight 208 lb 4 oz (94.462 kg). BMI: Body mass index is 30.74 kg/(m^2). General: Pleasant, NAD. Psych: Normal affect. Responds to questions with normal affect.  Neuro: Alert and oriented X 3. Moves all extremities spontaneously. HEENT: Normocephalic, atraumatic. EOM intact. Sclera anicteric.  Neck: Trachea midline. Supple without bruits or JVD. Lungs:  Respirations regular and unlabored. Expiratory wheezing without crackles or rhonchi.  Heart: RRR, normal s3, s4. No murmurs, rubs, or gallops.  Abdomen: Soft, non-tender, non-distended, BS + x 4.  Extremities: No clubbing, cyanosis or edema.    Accessory Clinical Findings:  EKG: Atrial paced, 63 bpm, nonspecific inferolateral st/t changes   Recent Labs: 05/10/2014: HGB 13.9; Platelet 186; SGPT (ALT) 37 07/13/2014: BUN 26; Creatinine, Ser 1.39*; Potassium 4.9; Sodium 138  No results found for requested labs within last 365 days.  CrCl cannot be calculated (Patient has no serum creatinine result on file.).  Weights: Wt Readings from Last 3 Encounters:   10/26/14 208 lb 4 oz (94.462  kg)  10/13/14 208 lb (94.348 kg)  08/25/14 200 lb 8 oz (90.946 kg)    Other studies Reviewed: Additional studies/ records that were reviewed today include: prior office notes and studies.  Assessment & Plan:  1. Unstable angina/CAD s/p PCI/DES as above: -Patient with recent nuclear stress test 05/2014 that showed no ischemia -Lesion along the ostial AV groove is unlikely to lead to great symptomatic benefit  -He prefers repeat cardiac catheterization given his recurrence of symptoms and elevation of blood pressure -Question of possible stress/anxiety component to his symptoms given his recent diagnosis of prostate cancer and pending treatment plan -Increase Imdur to 90 mg daily with the patient taking this 30 mg tid in an effort to help prevent hypotension and syncopal episodes  -Agree with him increasing his metoprolol to 25 mg bid  -SL NTG prn -Aspirin 81 mg daily -Lipitor 40 mg daily  -Risks and benefits of cardiac catheterization have been discussed with the patient including risks of bleeding, bruising, infection, kidney damage, stroke, heart attack, and death. The patient understands these risks and is willing to proceed with the procedure. All questions have been answered and concerns listened to.   2. Accelerated HTN: -Possibly in the setting of #1 -Agree with the increase in his metoprolol from 12.5 mg bid to 25 mg bid -Also agree with the increase in his Imdur from 30 mg qhs to 30 mg bid -Unfortunately, he has continued to be hypertensive with readings in the 782'N to 562'Z systolic -Increase Imdur to 90 mg daily with the patient taking this as 30 mg tid in an effort to prevent hypotension and syncopal episodes   3. Carotid artery stenosis: -Continue aggressive medical therapy as above -Repeat carotid doppler 10/2014 (post cardiac cath follow up will get this scheduled)  4. Prostate cancer: -Has appointment with urologist 9/30  5.  Anxiety/depression: -Per PCP   6. History of typical atrial flutter: -Status ablation 10/2013   7. Sinus node dysfunction: -Status post MDT PPM -Followed by EP  8. Orthostatic hypotension: -Doing well -Monitor closely with the increase in the above medications    Dispo: -Follow up post cardiac cath  Current medicines are reviewed at length with the patient today.  The patient did not have any concerns regarding medicines.   Christell Faith, PA-C [place], [place] [address] [phone] [facility] 10/26/2014, 3:59 PM

## 2014-10-26 NOTE — Telephone Encounter (Signed)
S/w pt who states he experienced SOB, elevated BP last week. Took nitro and felt better Since then, BP has been elevated at times. Did not have BP reading from today. States he does not feel that well. "I am not going to die but I don't feel like dancing" and would like to be seen.  Pt agreeable to 2:30pm appt w/Ryan Idolina Primer

## 2014-10-26 NOTE — Telephone Encounter (Signed)
Pt is calling stating last week he got a bit SOB. BP on 10/20/14 was around 200/173 took nitro and it got better  Was fine until Saturday and then his BP was around  175/80 kept going high then low.  Taken metoprolol, took a whole pill and it helped a bit but not sure about today.  He's states not feeling well.  Denies SOB now, but also states he has COPD. States that this sob is not like normal Denies blurred vision,dizzyness.  Would like to know if he needs to be seen today or not.

## 2014-10-27 LAB — CBC
HEMATOCRIT: 42.6 % (ref 37.5–51.0)
HEMOGLOBIN: 14.4 g/dL (ref 12.6–17.7)
MCH: 31.3 pg (ref 26.6–33.0)
MCHC: 33.8 g/dL (ref 31.5–35.7)
MCV: 93 fL (ref 79–97)
Platelets: 181 10*3/uL (ref 150–379)
RBC: 4.6 x10E6/uL (ref 4.14–5.80)
RDW: 14.6 % (ref 12.3–15.4)
WBC: 8.1 10*3/uL (ref 3.4–10.8)

## 2014-10-27 LAB — BASIC METABOLIC PANEL
BUN/Creatinine Ratio: 18 (ref 10–22)
BUN: 19 mg/dL (ref 8–27)
CALCIUM: 9.3 mg/dL (ref 8.6–10.2)
CO2: 19 mmol/L (ref 18–29)
CREATININE: 1.04 mg/dL (ref 0.76–1.27)
Chloride: 105 mmol/L (ref 97–108)
GFR calc Af Amer: 84 mL/min/{1.73_m2} (ref >59)
GFR, EST NON AFRICAN AMERICAN: 72 mL/min/{1.73_m2} (ref >59)
Glucose: 97 mg/dL (ref 65–99)
POTASSIUM: 5.4 mmol/L — AB (ref 3.5–5.2)
Sodium: 141 mmol/L (ref 134–144)

## 2014-10-27 LAB — PROTIME-INR
INR: 1 (ref 0.8–1.2)
Prothrombin Time: 10.5 s (ref 9.1–12.0)

## 2014-10-30 ENCOUNTER — Telehealth: Payer: Self-pay

## 2014-10-30 NOTE — Telephone Encounter (Signed)
S/w pt to review cardiac cath instructions. Pt  states if he does have PCI and is admitted one night, it will need to be stated as "inpatient" and not "observation" so insurance will pay. Suggested to pt he inquire of the status at the hospital on Monday. Pt verbalized understanding with no further questions.

## 2014-11-02 ENCOUNTER — Encounter
Admission: AD | Disposition: A | Discharge status: Home / Self Care | Payer: Self-pay | Source: Ambulatory Visit | Attending: Cardiovascular Disease

## 2014-11-02 ENCOUNTER — Inpatient Hospital Stay
Admission: AD | Admit: 2014-11-02 | Discharge: 2014-11-04 | DRG: 247 | Disposition: A | Discharge status: Home / Self Care | Payer: Medicare Other | Source: Ambulatory Visit | Attending: Cardiovascular Disease | Admitting: Cardiovascular Disease

## 2014-11-02 ENCOUNTER — Encounter: Payer: Self-pay | Admitting: *Deleted

## 2014-11-02 DIAGNOSIS — I208 Other forms of angina pectoris: Secondary | ICD-10-CM

## 2014-11-02 DIAGNOSIS — I6529 Occlusion and stenosis of unspecified carotid artery: Secondary | ICD-10-CM | POA: Diagnosis present

## 2014-11-02 DIAGNOSIS — I214 Non-ST elevation (NSTEMI) myocardial infarction: Secondary | ICD-10-CM | POA: Diagnosis present

## 2014-11-02 DIAGNOSIS — I951 Orthostatic hypotension: Secondary | ICD-10-CM | POA: Diagnosis present

## 2014-11-02 DIAGNOSIS — I2510 Atherosclerotic heart disease of native coronary artery without angina pectoris: Secondary | ICD-10-CM | POA: Diagnosis present

## 2014-11-02 DIAGNOSIS — Z9861 Coronary angioplasty status: Secondary | ICD-10-CM

## 2014-11-02 DIAGNOSIS — I25118 Atherosclerotic heart disease of native coronary artery with other forms of angina pectoris: Secondary | ICD-10-CM | POA: Diagnosis not present

## 2014-11-02 DIAGNOSIS — Z79899 Other long term (current) drug therapy: Secondary | ICD-10-CM

## 2014-11-02 DIAGNOSIS — E785 Hyperlipidemia, unspecified: Secondary | ICD-10-CM | POA: Diagnosis present

## 2014-11-02 DIAGNOSIS — M25559 Pain in unspecified hip: Secondary | ICD-10-CM | POA: Diagnosis not present

## 2014-11-02 DIAGNOSIS — C61 Malignant neoplasm of prostate: Secondary | ICD-10-CM | POA: Diagnosis present

## 2014-11-02 DIAGNOSIS — I25110 Atherosclerotic heart disease of native coronary artery with unstable angina pectoris: Secondary | ICD-10-CM | POA: Diagnosis present

## 2014-11-02 DIAGNOSIS — J432 Centrilobular emphysema: Secondary | ICD-10-CM | POA: Diagnosis not present

## 2014-11-02 DIAGNOSIS — Z7982 Long term (current) use of aspirin: Secondary | ICD-10-CM | POA: Diagnosis not present

## 2014-11-02 DIAGNOSIS — F329 Major depressive disorder, single episode, unspecified: Secondary | ICD-10-CM | POA: Diagnosis present

## 2014-11-02 DIAGNOSIS — J449 Chronic obstructive pulmonary disease, unspecified: Secondary | ICD-10-CM | POA: Diagnosis present

## 2014-11-02 DIAGNOSIS — F419 Anxiety disorder, unspecified: Secondary | ICD-10-CM | POA: Diagnosis present

## 2014-11-02 DIAGNOSIS — Z933 Colostomy status: Secondary | ICD-10-CM | POA: Diagnosis not present

## 2014-11-02 DIAGNOSIS — Z87891 Personal history of nicotine dependence: Secondary | ICD-10-CM

## 2014-11-02 DIAGNOSIS — Z85048 Personal history of other malignant neoplasm of rectum, rectosigmoid junction, and anus: Secondary | ICD-10-CM

## 2014-11-02 DIAGNOSIS — J45909 Unspecified asthma, uncomplicated: Secondary | ICD-10-CM | POA: Diagnosis present

## 2014-11-02 DIAGNOSIS — I252 Old myocardial infarction: Secondary | ICD-10-CM | POA: Diagnosis not present

## 2014-11-02 DIAGNOSIS — Z950 Presence of cardiac pacemaker: Secondary | ICD-10-CM

## 2014-11-02 DIAGNOSIS — I200 Unstable angina: Secondary | ICD-10-CM | POA: Diagnosis not present

## 2014-11-02 DIAGNOSIS — I10 Essential (primary) hypertension: Secondary | ICD-10-CM | POA: Diagnosis present

## 2014-11-02 DIAGNOSIS — I483 Typical atrial flutter: Secondary | ICD-10-CM | POA: Diagnosis present

## 2014-11-02 DIAGNOSIS — Z955 Presence of coronary angioplasty implant and graft: Secondary | ICD-10-CM | POA: Diagnosis not present

## 2014-11-02 DIAGNOSIS — I2089 Other forms of angina pectoris: Secondary | ICD-10-CM | POA: Diagnosis present

## 2014-11-02 HISTORY — PX: CARDIAC CATHETERIZATION: SHX172

## 2014-11-02 LAB — MRSA PCR SCREENING: MRSA BY PCR: POSITIVE — AB

## 2014-11-02 LAB — GLUCOSE, CAPILLARY: Glucose-Capillary: 110 mg/dL — ABNORMAL HIGH (ref 65–99)

## 2014-11-02 SURGERY — LEFT HEART CATH AND CORONARY ANGIOGRAPHY
Anesthesia: Moderate Sedation

## 2014-11-02 SURGERY — LEFT HEART CATH
Anesthesia: Moderate Sedation

## 2014-11-02 MED ORDER — CLOPIDOGREL BISULFATE 75 MG PO TABS
ORAL_TABLET | ORAL | Status: DC | PRN
  Administered 2014-11-02: 600 mg via ORAL

## 2014-11-02 MED ORDER — SODIUM CHLORIDE 0.9 % IV SOLN
250.0000 mg | INTRAVENOUS | Status: DC | PRN
  Administered 2014-11-02: 1.75 mg/kg/h via INTRAVENOUS

## 2014-11-02 MED ORDER — CLOPIDOGREL BISULFATE 75 MG PO TABS
75.0000 mg | ORAL_TABLET | Freq: Every day | ORAL | Status: DC
  Administered 2014-11-03 – 2014-11-04 (×2): 75 mg via ORAL
  Filled 2014-11-02 (×2): qty 1

## 2014-11-02 MED ORDER — ONDANSETRON HCL 4 MG/2ML IJ SOLN
4.0000 mg | Freq: Four times a day (QID) | INTRAMUSCULAR | Status: DC | PRN
  Administered 2014-11-02: 4 mg via INTRAVENOUS

## 2014-11-02 MED ORDER — SODIUM CHLORIDE 0.9 % IJ SOLN
3.0000 mL | Freq: Two times a day (BID) | INTRAMUSCULAR | Status: DC
  Administered 2014-11-03: 3 mL via INTRAVENOUS

## 2014-11-02 MED ORDER — CLOPIDOGREL BISULFATE 75 MG PO TABS
ORAL_TABLET | ORAL | Status: AC
  Filled 2014-11-02: qty 8

## 2014-11-02 MED ORDER — MORPHINE SULFATE (PF) 2 MG/ML IV SOLN
INTRAVENOUS | Status: AC
  Administered 2014-11-02: 2 mg via INTRAVENOUS
  Filled 2014-11-02: qty 1

## 2014-11-02 MED ORDER — SODIUM CHLORIDE 0.9 % WEIGHT BASED INFUSION: 1.0000 mL/kg/h | INTRAVENOUS | Status: DC

## 2014-11-02 MED ORDER — HEPARIN (PORCINE) IN NACL 2-0.9 UNIT/ML-% IJ SOLN
INTRAMUSCULAR | Status: AC
  Filled 2014-11-02: qty 1000

## 2014-11-02 MED ORDER — CHLORHEXIDINE GLUCONATE CLOTH 2 % EX PADS
6.0000 | MEDICATED_PAD | Freq: Every day | CUTANEOUS | Status: DC
  Administered 2014-11-03: 6 via TOPICAL

## 2014-11-02 MED ORDER — MIDAZOLAM HCL 2 MG/2ML IJ SOLN
INTRAMUSCULAR | Status: AC
  Filled 2014-11-02: qty 2

## 2014-11-02 MED ORDER — SODIUM CHLORIDE 0.9 % WEIGHT BASED INFUSION
3.0000 mL/kg/h | INTRAVENOUS | Status: DC
  Administered 2014-11-02: 3 mL/kg/h via INTRAVENOUS

## 2014-11-02 MED ORDER — IOHEXOL 300 MG/ML  SOLN
INTRAMUSCULAR | Status: DC | PRN
  Administered 2014-11-02: 205 mL via INTRA_ARTERIAL

## 2014-11-02 MED ORDER — ARFORMOTEROL TARTRATE 15 MCG/2ML IN NEBU
15.0000 ug | INHALATION_SOLUTION | Freq: Two times a day (BID) | RESPIRATORY_TRACT | Status: DC
  Administered 2014-11-02 – 2014-11-04 (×4): 15 ug via RESPIRATORY_TRACT
  Filled 2014-11-02 (×8): qty 2

## 2014-11-02 MED ORDER — FENTANYL CITRATE (PF) 100 MCG/2ML IJ SOLN: 50.0000 ug | Freq: Once | INTRAMUSCULAR | Status: DC

## 2014-11-02 MED ORDER — VITAMIN D3 25 MCG (1000 UNIT) PO TABS
2000.0000 [IU] | ORAL_TABLET | Freq: Every day | ORAL | Status: DC
  Administered 2014-11-03 – 2014-11-04 (×2): 2000 [IU] via ORAL
  Filled 2014-11-02 (×5): qty 2

## 2014-11-02 MED ORDER — FENTANYL CITRATE (PF) 100 MCG/2ML IJ SOLN
INTRAMUSCULAR | Status: AC
  Filled 2014-11-02: qty 2

## 2014-11-02 MED ORDER — ASPIRIN 81 MG PO CHEW
CHEWABLE_TABLET | ORAL | Status: DC | PRN
  Administered 2014-11-02: 324 mg via ORAL

## 2014-11-02 MED ORDER — NITROGLYCERIN 5 MG/ML IV SOLN
INTRAVENOUS | Status: AC
  Filled 2014-11-02: qty 10

## 2014-11-02 MED ORDER — MUPIROCIN 2 % EX OINT
1.0000 "application " | TOPICAL_OINTMENT | Freq: Two times a day (BID) | CUTANEOUS | Status: DC
  Administered 2014-11-02 – 2014-11-03 (×3): 1 via NASAL
  Filled 2014-11-02: qty 22

## 2014-11-02 MED ORDER — ASPIRIN 81 MG PO CHEW: 81.0000 mg | CHEWABLE_TABLET | ORAL | Status: DC

## 2014-11-02 MED ORDER — SODIUM CHLORIDE 0.9 % IV SOLN
INTRAVENOUS | Status: AC
  Administered 2014-11-02: 16:00:00 via INTRAVENOUS

## 2014-11-02 MED ORDER — HEPARIN SODIUM (PORCINE) 1000 UNIT/ML IJ SOLN
INTRAMUSCULAR | Status: AC
  Filled 2014-11-02: qty 1

## 2014-11-02 MED ORDER — SODIUM CHLORIDE 0.9 % IJ SOLN: 3.0000 mL | INTRAMUSCULAR | Status: DC | PRN

## 2014-11-02 MED ORDER — NITROGLYCERIN 1 MG/10 ML FOR IR/CATH LAB
INTRA_ARTERIAL | Status: DC | PRN
  Administered 2014-11-02: 200 ug via INTRA_ARTERIAL
  Administered 2014-11-02: 100 ug via INTRACORONARY

## 2014-11-02 MED ORDER — ASPIRIN 81 MG PO CHEW
CHEWABLE_TABLET | ORAL | Status: AC
  Filled 2014-11-02: qty 3

## 2014-11-02 MED ORDER — HEPARIN SODIUM (PORCINE) 1000 UNIT/ML IJ SOLN
INTRAMUSCULAR | Status: DC | PRN
  Administered 2014-11-02: 5000 [IU] via INTRAVENOUS

## 2014-11-02 MED ORDER — HYDRALAZINE HCL 20 MG/ML IJ SOLN
INTRAMUSCULAR | Status: AC
  Filled 2014-11-02: qty 1

## 2014-11-02 MED ORDER — HYDRALAZINE HCL 20 MG/ML IJ SOLN
10.0000 mg | Freq: Once | INTRAMUSCULAR | Status: AC
  Administered 2014-11-02: 10 mg via INTRAVENOUS

## 2014-11-02 MED ORDER — TIOTROPIUM BROMIDE MONOHYDRATE 18 MCG IN CAPS
18.0000 ug | ORAL_CAPSULE | Freq: Every day | RESPIRATORY_TRACT | Status: DC
  Filled 2014-11-02: qty 5

## 2014-11-02 MED ORDER — SODIUM CHLORIDE 0.9 % IV SOLN: 250.0000 mL | INTRAVENOUS | Status: DC | PRN

## 2014-11-02 MED ORDER — ACETAMINOPHEN 325 MG PO TABS: 650.0000 mg | ORAL_TABLET | ORAL | Status: DC | PRN

## 2014-11-02 MED ORDER — BUDESONIDE 0.25 MG/2ML IN SUSP
0.2500 mg | Freq: Two times a day (BID) | RESPIRATORY_TRACT | Status: DC
  Administered 2014-11-02 – 2014-11-04 (×4): 0.25 mg via RESPIRATORY_TRACT
  Filled 2014-11-02 (×4): qty 2

## 2014-11-02 MED ORDER — NITROGLYCERIN 0.4 MG SL SUBL
SUBLINGUAL_TABLET | SUBLINGUAL | Status: AC
  Administered 2014-11-02: 0.4 mg via SUBLINGUAL
  Filled 2014-11-02: qty 1

## 2014-11-02 MED ORDER — NITROGLYCERIN 0.4 MG SL SUBL
0.4000 mg | SUBLINGUAL_TABLET | SUBLINGUAL | Status: DC | PRN
  Administered 2014-11-02 (×3): 0.4 mg via SUBLINGUAL

## 2014-11-02 MED ORDER — NITROGLYCERIN 0.3 MG SL SUBL: 0.3000 mg | SUBLINGUAL_TABLET | SUBLINGUAL | Status: DC | PRN

## 2014-11-02 MED ORDER — NITROGLYCERIN IN D5W 200-5 MCG/ML-% IV SOLN
0.0000 ug/min | INTRAVENOUS | Status: DC
  Administered 2014-11-02: 10 ug/min via INTRAVENOUS

## 2014-11-02 MED ORDER — SODIUM CHLORIDE 0.9 % IJ SOLN
3.0000 mL | Freq: Two times a day (BID) | INTRAMUSCULAR | Status: DC
  Administered 2014-11-02: 10 mL via INTRAVENOUS

## 2014-11-02 MED ORDER — BIVALIRUDIN BOLUS VIA INFUSION - CUPID
INTRAVENOUS | Status: DC | PRN
  Administered 2014-11-02: 68.7 mg via INTRAVENOUS

## 2014-11-02 MED ORDER — MORPHINE SULFATE (PF) 2 MG/ML IV SOLN
2.0000 mg | Freq: Once | INTRAVENOUS | Status: AC
  Administered 2014-11-02: 2 mg via INTRAVENOUS

## 2014-11-02 MED ORDER — VERAPAMIL HCL 2.5 MG/ML IV SOLN
INTRAVENOUS | Status: AC
  Filled 2014-11-02: qty 2

## 2014-11-02 MED ORDER — BIVALIRUDIN 250 MG IV SOLR
INTRAVENOUS | Status: AC
  Filled 2014-11-02: qty 250

## 2014-11-02 MED ORDER — NITROGLYCERIN IN D5W 200-5 MCG/ML-% IV SOLN
INTRAVENOUS | Status: AC
  Filled 2014-11-02: qty 250

## 2014-11-02 MED ORDER — ISOSORBIDE MONONITRATE ER 30 MG PO TB24
30.0000 mg | ORAL_TABLET | Freq: Three times a day (TID) | ORAL | Status: DC
  Administered 2014-11-02 – 2014-11-04 (×5): 30 mg via ORAL
  Filled 2014-11-02 (×5): qty 1

## 2014-11-02 MED ORDER — ALBUTEROL SULFATE (2.5 MG/3ML) 0.083% IN NEBU
2.5000 mg | INHALATION_SOLUTION | Freq: Four times a day (QID) | RESPIRATORY_TRACT | Status: DC | PRN
  Administered 2014-11-02 – 2014-11-04 (×5): 2.5 mg via RESPIRATORY_TRACT
  Filled 2014-11-02 (×4): qty 3

## 2014-11-02 MED ORDER — MORPHINE SULFATE (PF) 2 MG/ML IV SOLN
2.0000 mg | Freq: Once | INTRAVENOUS | Status: DC
  Filled 2014-11-02: qty 1

## 2014-11-02 MED ORDER — VERAPAMIL HCL 2.5 MG/ML IV SOLN
INTRAVENOUS | Status: DC | PRN
  Administered 2014-11-02: 2.5 mg via INTRA_ARTERIAL

## 2014-11-02 MED ORDER — ATORVASTATIN CALCIUM 20 MG PO TABS
40.0000 mg | ORAL_TABLET | Freq: Every day | ORAL | Status: DC
  Administered 2014-11-02: 40 mg via ORAL
  Filled 2014-11-02 (×3): qty 2

## 2014-11-02 MED ORDER — METOPROLOL TARTRATE 25 MG PO TABS
25.0000 mg | ORAL_TABLET | Freq: Two times a day (BID) | ORAL | Status: DC
  Administered 2014-11-02 – 2014-11-03 (×2): 25 mg via ORAL
  Filled 2014-11-02 (×2): qty 1

## 2014-11-02 MED ORDER — MIDAZOLAM HCL 2 MG/2ML IJ SOLN
INTRAMUSCULAR | Status: DC | PRN
  Administered 2014-11-02 (×2): 1 mg via INTRAVENOUS

## 2014-11-02 MED ORDER — ONDANSETRON HCL 4 MG/2ML IJ SOLN
INTRAMUSCULAR | Status: AC
  Filled 2014-11-02: qty 2

## 2014-11-02 MED ORDER — FENTANYL CITRATE (PF) 100 MCG/2ML IJ SOLN
INTRAMUSCULAR | Status: DC | PRN
  Administered 2014-11-02: 50 ug via INTRAVENOUS
  Administered 2014-11-02: 25 ug via INTRAVENOUS

## 2014-11-02 MED ORDER — ASPIRIN EC 81 MG PO TBEC
81.0000 mg | DELAYED_RELEASE_TABLET | Freq: Every day | ORAL | Status: DC
  Administered 2014-11-03 – 2014-11-04 (×2): 81 mg via ORAL
  Filled 2014-11-02 (×2): qty 1

## 2014-11-02 SURGICAL SUPPLY — 17 items
BALLN ANGIOSCULPT RX 2.5X10 (BALLOONS) ×3
BALLN TREK RX 2.5X12 (BALLOONS) ×3
BALLOON ANGIOSCULPT RX 2.5X10 (BALLOONS) ×1 IMPLANT
BALLOON TREK RX 2.5X12 (BALLOONS) ×1 IMPLANT
CATH INFINITI 5FR ANG PIGTAIL (CATHETERS) ×3 IMPLANT
CATH OPTITORQUE JACKY 4.0 5F (CATHETERS) ×3 IMPLANT
CATH VISTA GUIDE 6FR JR4 (CATHETERS) ×3 IMPLANT
DEVICE INFLAT 30 PLUS (MISCELLANEOUS) ×3 IMPLANT
DEVICE RAD TR BAND REGULAR (VASCULAR PRODUCTS) ×3 IMPLANT
GLIDESHEATH SLEND SS 6F .021 (SHEATH) ×3 IMPLANT
GUIDEWIRE INTUITION 300CM (WIRE) ×3 IMPLANT
KIT MANI 3VAL PERCEP (MISCELLANEOUS) ×3 IMPLANT
PACK CARDIAC CATH (CUSTOM PROCEDURE TRAY) ×3 IMPLANT
STENT XIENCE ALPINE RX 2.5X8 (Permanent Stent) ×3 IMPLANT
VALVE COPILOT STAT (MISCELLANEOUS) ×3 IMPLANT
WIRE RUNTHROUGH .014X180CM (WIRE) ×3 IMPLANT
WIRE SAFE-T 1.5MM-J .035X260CM (WIRE) ×3 IMPLANT

## 2014-11-02 NOTE — H&P (View-Only) (Signed)
Cardiology Office Note:  Date of Encounter: 10/26/2014  ID: Donald Juarez, DOB 09/13/1945, MRN [id number]  PCP:  Sofie Hartigan, MD Primary Cardiologist:  Dr. Fletcher Anon, MD Primary Electrophysiologist: Dr. Caryl Comes, MD  Chief Complaint  Patient presents with  . other    Pt. c/o last Tuesday BP elevated to 220/173 with numbness in his jaw; checked at home with his BP cuff, has shortness of breath, chest pain.      HPI:  69 year old male with history of CAD with most recent stenting to LCx in 2011 in the setting of NSTEMI, paroxysmal atrial flutter with prior radiofrequency ablation on 10/31/2013, difficult to manage COPD, systolic HTN, orthostatic intolerance, carotid stenosis chronically occluded on the right side, prior syncope and collapse, SSS s/p MDT PPM in 12/2009, asthma with chronic SOB, rectal cancer s/p post colostomy, and recently diagnosed prostate cancer who presents as a work in today for increased blood pressure and SOB over the past week.   Patient most recently underwent cardiac cath 03/2014 in the setting of worsening exertional dyspnea and chest pain. Cath showed patent LCx stents, 80% stenosis in the ostial right AV grove artery at the bifurcation of the PDA. This was not an optimal location for PCI and supplied a relatively small area. Thus, it was felt that the risk of PCI outweighed the benefit. LVEDP was 22. He was started on low dose Lasix and seen by Dr. Lake Bells for optimization of his COPD with some improvement. However, he continued to feel bad almost on a daily basis. He had been under a signifianct amount of stress due to his wife being disabled. He underwent Myoview in 05/2014 that showed no ischemia with an EF of 55%.   Recently seen by Dr. Caryl Comes, MD 10/13/2014 for cardiac clearance secondary to his recently diagnosed prostate cancer. He was cleared for surgery at that time. His BP was 146/85. He was euvolemic as well. His orthostasis had much improved.   He  previously amitted to depression and anxiety.   He called his pulmonologist's office on 9/19 stating his nebulizer machine had been causing him some issues. An order was placed for a new one.   He called our office on 9/26 stating he was SOB the week prior and that his BP was elevated around 220/173, with associated left sided chest pain. He took a SL NTG and both his chest pain and BP improved. On 9/24 his BP was 175/80. This morning his BP was in the 546'T systolic. He has self titrated his metoprolol initially from 12.5 mg bid to 12.5 mg q AM to 25 mg q PM, then to 25 mg bid and his Imdur from 30 mg qhs to 30 mg bid. He reports he has not been "feeling well," stating he feels like he was "hit by a truck." He has not had any further chest pain since the episode on 9/20, though he continues to feel tried and not quite like himself. BP typically runs in the 035'W systolic. The above SL NTG was the only one he has had to take, and only the third one he has ever taken. He does have a lot on his plate currently as he will be sees the urologist on 9/30 for discussion on the management of his recently diagnosed prostate cancer. He is currently chest pain free.     Past Medical History  Diagnosis Date  . COPD (chronic obstructive pulmonary disease)   . Carotid stenosis  a. 07/2013 Carotid U/S: 100% RICA (pt prev reported h/o 26%), LICA 83-41%, bilat ECA 50%, normal vertebrals and subclavians bilat-->F/U needed in 01/2014.  [name] Hypertension   . Paroxysmal atrial flutter     a. CHA2DS2VASc = 3 - on eliquis;  b. 07/2013 Echo: EF 50-55%, mildly dil LA.c. s/p ablation 10/31/2013 by Dr Caryl Comes  . Syncope and collapse   . Coronary artery disease     a. 06/1996 & 03/2005 Cath: non-obs dzs;  b. 07/2009 NSTEMI/Cath: LCX 99->PCI/DES extending into OM1;  c. 01/2013 Cath: LM nl, LAD 30p, 48m, LCX 30ost, 20p ISR, 66m, OM1 patent stent, OM2 30/small, RCA 20/40p, 51m, RPL 75, EF 55%-->Med Rx.; d. cath 03/2014: no evidence of  occlusive CAD, patent LCx stent, bifurcation stenosis involving ostium of R AV groove artery, no PCI indicated w/o refractory symptom  . Hyperlipidemia   . Cataract, right     a. 10/2010 s/p cataract surgery   . Shingles     a. 09/2012.  . Pulmonary nodule, right     a. 0.7cm RLL - stable by CT 06/2013.  . Bilateral claudication of lower limb   . SSS (sick sinus syndrome)     a. 12/2009 S/P MDT PPM ([name])  . Hyperkalemia   . Presence of permanent cardiac pacemaker   . Shortness of breath dyspnea   . Asthma   . Myocardial infarction     2003  . History of radiofrequency ablation procedure for cardiac arrhythmia     9/15  . Seizures     last 30 years ago  . Rectal cancer     a. Status post colostomy  . H/O wheezing   . Dysrhythmia   :  Past Surgical History  Procedure Laterality Date  . Cardiac surgery  2011    Stents placed   . Cataract surgery  2012  . Colon surgery      cancer removal  . Knee arthroscopy Right 1980's  . Insert / replace / remove pacemaker    . Ablation  10/31/2013    CTI ablation by Dr Caryl Comes for atrial flutter  . Atrial flutter ablation N/A 10/31/2013    Procedure: ATRIAL FLUTTER ABLATION;  Surgeon: [name], MD;  Location: [facility] CATH LAB;  Service: Cardiovascular;  Laterality: N/A;  . Cardiac catheterization  07/2009    [facility]  . Cardiac catheterization  01/2013    [facility]  . Cardiac catheterization  03/2014    [facility]  . Coronary angioplasty    . Colon surgery      colostomy  . Cataract extraction w/phaco Left 06/01/2014    Procedure: CATARACT EXTRACTION PHACO AND INTRAOCULAR LENS PLACEMENT (IOC);  Surgeon: [name], MD;  Location: [name] ORS;  Service: Ophthalmology;  Laterality: Left;  . Cataract extraction Left   :  Social History:  The patient  reports that he quit smoking about 6 years ago. His smoking use included Cigarettes. He has a 30 pack-year smoking history. He has never used smokeless tobacco. He reports that he does not drink  alcohol or use illicit drugs.   Family History  Problem Relation Age of Onset  . Cancer Mother     'male cancer"  . Hypertension Mother   . Hyperlipidemia Mother   . Heart disease Father   . Heart attack Father   . Hypertension Father   . Hyperlipidemia Father      Allergies:  No Known Allergies   Home Medications:  Current Outpatient Prescriptions  Medication Sig  Dispense Refill  . albuterol (PROVENTIL) (2.5 MG/3ML) 0.083% nebulizer solution Take 3 mLs (2.5 mg total) by nebulization every 6 (six) hours as needed for wheezing or shortness of breath. DX code 493.20 120 mL 3  . arformoterol (BROVANA) 15 MCG/2ML NEBU Take 2 mLs (15 mcg total) by nebulization 2 (two) times daily. DX code 493.20    . aspirin 81 MG tablet Take 81 mg by mouth daily.    [name] atorvastatin (LIPITOR) 40 MG tablet Take 40 mg by mouth daily.    . budesonide (PULMICORT) 0.25 MG/2ML nebulizer solution Take 0.25 mg by nebulization 2 (two) times daily.    . Cholecalciferol (VITAMIN D) 2000 UNITS tablet Take 2,000 Units by mouth daily.    . metoprolol tartrate (LOPRESSOR) 25 MG tablet Take 1 tablet (25 mg total) by mouth 2 (two) times daily. (Patient taking differently: Take 12.5 mg by mouth 2 (two) times daily. Take 1/2 tablet (12.5 mg) by mouth twice daily) 60 tablet 6  . nitroGLYCERIN (NITROSTAT) 0.3 MG SL tablet Place 1 tablet (0.3 mg total) under the tongue every 5 (five) minutes as needed for chest pain. 90 tablet 12  . nystatin cream (MYCOSTATIN) Use as directed by doctor    . tiotropium (SPIRIVA) 18 MCG inhalation capsule Place 1 capsule (18 mcg total) into inhaler and inhale daily. 30 capsule 11  . traMADol (ULTRAM) 50 MG tablet Take 50 mg by mouth daily as needed.    . isosorbide mononitrate (IMDUR) 60 MG 24 hr tablet Take 0.5 tablets (30 mg total) by mouth 3 (three) times daily. 45 tablet 3   No current facility-administered medications for this visit.     Review of Systems:  Review of Systems    Constitutional: Positive for malaise/fatigue. Negative for fever, chills, weight loss and diaphoresis.  HENT: Negative for congestion.   Eyes: Negative for discharge and redness.  Respiratory: Positive for shortness of breath. Negative for cough, hemoptysis, sputum production and wheezing.   Cardiovascular: Positive for chest pain and claudication. Negative for palpitations, orthopnea, leg swelling and PND.       No chest pain since 9/20  Gastrointestinal: Negative for heartburn, nausea and vomiting.  Musculoskeletal: Negative for falls.  Skin: Negative for rash.  Neurological: Positive for weakness. Negative for dizziness, tingling, tremors, sensory change, speech change and focal weakness.  Endo/Heme/Allergies: Does not bruise/bleed easily.  Psychiatric/Behavioral: The patient is nervous/anxious.      Physical Exam:  Blood pressure 160/80, pulse 63, height 5\' 9"  (1.753 m), weight 208 lb 4 oz (94.462 kg). BMI: Body mass index is 30.74 kg/(m^2). General: Pleasant, NAD. Psych: Normal affect. Responds to questions with normal affect.  Neuro: Alert and oriented X 3. Moves all extremities spontaneously. HEENT: Normocephalic, atraumatic. EOM intact. Sclera anicteric.  Neck: Trachea midline. Supple without bruits or JVD. Lungs:  Respirations regular and unlabored. Expiratory wheezing without crackles or rhonchi.  Heart: RRR, normal s3, s4. No murmurs, rubs, or gallops.  Abdomen: Soft, non-tender, non-distended, BS + x 4.  Extremities: No clubbing, cyanosis or edema.    Accessory Clinical Findings:  EKG: Atrial paced, 63 bpm, nonspecific inferolateral st/t changes   Recent Labs: 05/10/2014: HGB 13.9; Platelet 186; SGPT (ALT) 37 07/13/2014: BUN 26; Creatinine, Ser 1.39*; Potassium 4.9; Sodium 138  No results found for requested labs within last 365 days.  CrCl cannot be calculated (Patient has no serum creatinine result on file.).  Weights: Wt Readings from Last 3 Encounters:   10/26/14 208 lb 4 oz (94.462  kg)  10/13/14 208 lb (94.348 kg)  08/25/14 200 lb 8 oz (90.946 kg)    Other studies Reviewed: Additional studies/ records that were reviewed today include: prior office notes and studies.  Assessment & Plan:  1. Unstable angina/CAD s/p PCI/DES as above: -Patient with recent nuclear stress test 05/2014 that showed no ischemia -Lesion along the ostial AV groove is unlikely to lead to great symptomatic benefit  -He prefers repeat cardiac catheterization given his recurrence of symptoms and elevation of blood pressure -Question of possible stress/anxiety component to his symptoms given his recent diagnosis of prostate cancer and pending treatment plan -Increase Imdur to 90 mg daily with the patient taking this 30 mg tid in an effort to help prevent hypotension and syncopal episodes  -Agree with him increasing his metoprolol to 25 mg bid  -SL NTG prn -Aspirin 81 mg daily -Lipitor 40 mg daily  -Risks and benefits of cardiac catheterization have been discussed with the patient including risks of bleeding, bruising, infection, kidney damage, stroke, heart attack, and death. The patient understands these risks and is willing to proceed with the procedure. All questions have been answered and concerns listened to.   2. Accelerated HTN: -Possibly in the setting of #1 -Agree with the increase in his metoprolol from 12.5 mg bid to 25 mg bid -Also agree with the increase in his Imdur from 30 mg qhs to 30 mg bid -Unfortunately, he has continued to be hypertensive with readings in the 355'H to 741'U systolic -Increase Imdur to 90 mg daily with the patient taking this as 30 mg tid in an effort to prevent hypotension and syncopal episodes   3. Carotid artery stenosis: -Continue aggressive medical therapy as above -Repeat carotid doppler 10/2014 (post cardiac cath follow up will get this scheduled)  4. Prostate cancer: -Has appointment with urologist 9/30  5.  Anxiety/depression: -Per PCP   6. History of typical atrial flutter: -Status ablation 10/2013   7. Sinus node dysfunction: -Status post MDT PPM -Followed by EP  8. Orthostatic hypotension: -Doing well -Monitor closely with the increase in the above medications    Dispo: -Follow up post cardiac cath  Current medicines are reviewed at length with the patient today.  The patient did not have any concerns regarding medicines.   Christell Faith, PA-C [address] [place] [address] [place], [place] [id number] [phone] [facility] 10/26/2014, 3:59 PM

## 2014-11-02 NOTE — Interval H&P Note (Signed)
Cath Lab Visit (complete for each Cath Lab visit)  Clinical Evaluation Leading to the Procedure:   ACS: No.  Non-ACS:    Anginal Classification: CCS III  Anti-ischemic medical therapy: Maximal Therapy (2 or more classes of medications)  Non-Invasive Test Results: No non-invasive testing performed  Prior CABG: No previous CABG      History and Physical Interval Note:  11/02/2014 1:56 PM  Donald Juarez  has presented today for surgery, with the diagnosis of Chest pain  The various methods of treatment have been discussed with the patient and family. After consideration of risks, benefits and other options for treatment, the patient has consented to  Procedure(s): Left Heart Cath (N/A) Coronary Stent Intervention (N/A) as a surgical intervention .  The patient's history has been reviewed, patient examined, no change in status, stable for surgery.  I have reviewed the patient's chart and labs.  Questions were answered to the patient's satisfaction.     Kathlyn Sacramento

## 2014-11-02 NOTE — Progress Notes (Signed)
Post distal RCA PCI. The patient started having chest pain with significantly elevated blood pressure. He responded to nitroglycerin, hydralazine and fentanyl. However, he continued to have intermittent chest pain with elevated blood pressure. Thus, I elected to start him on intravenous nitroglycerin and admit to the ICU. 3 serial EKGs were performed and showed nonspecific ST depression not different compared to prior EKGs.

## 2014-11-03 ENCOUNTER — Encounter: Payer: Self-pay | Admitting: Cardiovascular Disease

## 2014-11-03 ENCOUNTER — Encounter
Admission: AD | Disposition: A | Discharge status: Home / Self Care | Payer: Self-pay | Source: Ambulatory Visit | Attending: Cardiovascular Disease

## 2014-11-03 DIAGNOSIS — I214 Non-ST elevation (NSTEMI) myocardial infarction: Secondary | ICD-10-CM | POA: Diagnosis present

## 2014-11-03 DIAGNOSIS — I200 Unstable angina: Secondary | ICD-10-CM | POA: Diagnosis present

## 2014-11-03 DIAGNOSIS — I2510 Atherosclerotic heart disease of native coronary artery without angina pectoris: Secondary | ICD-10-CM

## 2014-11-03 DIAGNOSIS — J432 Centrilobular emphysema: Secondary | ICD-10-CM

## 2014-11-03 DIAGNOSIS — I10 Essential (primary) hypertension: Secondary | ICD-10-CM

## 2014-11-03 HISTORY — PX: CARDIAC CATHETERIZATION: SHX172

## 2014-11-03 LAB — CBC
HEMATOCRIT: 42.8 % (ref 40.0–52.0)
HEMOGLOBIN: 15 g/dL (ref 13.0–18.0)
MCH: 31.8 pg (ref 26.0–34.0)
MCHC: 35.1 g/dL (ref 32.0–36.0)
MCV: 90.6 fL (ref 80.0–100.0)
Platelets: 199 10*3/uL (ref 150–440)
RBC: 4.73 MIL/uL (ref 4.40–5.90)
RDW: 14.7 % — AB (ref 11.5–14.5)
WBC: 9.1 10*3/uL (ref 3.8–10.6)

## 2014-11-03 LAB — BASIC METABOLIC PANEL
Anion gap: 6 (ref 5–15)
BUN: 14 mg/dL (ref 6–20)
CALCIUM: 8.9 mg/dL (ref 8.9–10.3)
CO2: 22 mmol/L (ref 22–32)
Chloride: 110 mmol/L (ref 101–111)
Creatinine, Ser: 0.71 mg/dL (ref 0.61–1.24)
GFR calc Af Amer: >60 mL/min (ref >60)
GLUCOSE: 129 mg/dL — AB (ref 65–99)
POTASSIUM: 4.1 mmol/L (ref 3.5–5.1)
Sodium: 138 mmol/L (ref 135–145)

## 2014-11-03 LAB — TROPONIN I: Troponin I: 10.85 ng/mL — ABNORMAL HIGH (ref <0.031)

## 2014-11-03 LAB — APTT: APTT: 42 s — AB (ref 24–36)

## 2014-11-03 LAB — PROTIME-INR
INR: 0.97
Prothrombin Time: 13.1 seconds (ref 11.4–15.0)

## 2014-11-03 SURGERY — LEFT HEART CATH AND CORONARY ANGIOGRAPHY
Anesthesia: Moderate Sedation

## 2014-11-03 MED ORDER — HEPARIN SODIUM (PORCINE) 1000 UNIT/ML IJ SOLN
INTRAMUSCULAR | Status: AC
  Filled 2014-11-03: qty 1

## 2014-11-03 MED ORDER — MIDAZOLAM HCL 2 MG/2ML IJ SOLN
INTRAMUSCULAR | Status: AC
  Filled 2014-11-03: qty 2

## 2014-11-03 MED ORDER — VERAPAMIL HCL 2.5 MG/ML IV SOLN
INTRAVENOUS | Status: AC
  Filled 2014-11-03: qty 2

## 2014-11-03 MED ORDER — HEPARIN SODIUM (PORCINE) 1000 UNIT/ML IJ SOLN
INTRAMUSCULAR | Status: DC | PRN
  Administered 2014-11-03: 3000 [IU] via INTRAVENOUS

## 2014-11-03 MED ORDER — MORPHINE SULFATE (PF) 2 MG/ML IV SOLN
2.0000 mg | Freq: Once | INTRAVENOUS | Status: AC
  Administered 2014-11-03: 2 mg via INTRAVENOUS

## 2014-11-03 MED ORDER — IOHEXOL 300 MG/ML  SOLN
INTRAMUSCULAR | Status: DC | PRN
  Administered 2014-11-03: 85 mL via INTRA_ARTERIAL

## 2014-11-03 MED ORDER — FENTANYL CITRATE (PF) 100 MCG/2ML IJ SOLN
INTRAMUSCULAR | Status: AC
  Filled 2014-11-03: qty 2

## 2014-11-03 MED ORDER — SODIUM CHLORIDE 0.9 % IJ SOLN: 3.0000 mL | Freq: Two times a day (BID) | INTRAMUSCULAR | Status: DC

## 2014-11-03 MED ORDER — HEPARIN BOLUS VIA INFUSION
4000.0000 [IU] | Freq: Once | INTRAVENOUS | Status: AC
  Administered 2014-11-03: 4000 [IU] via INTRAVENOUS
  Filled 2014-11-03: qty 4000

## 2014-11-03 MED ORDER — HEPARIN (PORCINE) IN NACL 2-0.9 UNIT/ML-% IJ SOLN
INTRAMUSCULAR | Status: AC
  Filled 2014-11-03: qty 1000

## 2014-11-03 MED ORDER — SODIUM CHLORIDE 0.9 % IV SOLN
250.0000 mL | INTRAVENOUS | Status: DC | PRN
Start: 2014-11-03 — End: 2014-11-04

## 2014-11-03 MED ORDER — HEPARIN (PORCINE) IN NACL 100-0.45 UNIT/ML-% IJ SOLN
1150.0000 [IU]/h | INTRAMUSCULAR | Status: DC
  Administered 2014-11-03: 1150 [IU]/h via INTRAVENOUS
  Filled 2014-11-03 (×2): qty 250

## 2014-11-03 MED ORDER — SODIUM CHLORIDE 0.9 % IJ SOLN
3.0000 mL | Freq: Two times a day (BID) | INTRAMUSCULAR | Status: DC
  Administered 2014-11-03: 3 mL via INTRAVENOUS

## 2014-11-03 MED ORDER — SODIUM CHLORIDE 0.9 % WEIGHT BASED INFUSION
1.0000 mL/kg/h | INTRAVENOUS | Status: DC
  Administered 2014-11-03: 1 mL/kg/h via INTRAVENOUS

## 2014-11-03 MED ORDER — METOPROLOL TARTRATE 50 MG PO TABS
50.0000 mg | ORAL_TABLET | Freq: Two times a day (BID) | ORAL | Status: DC
  Administered 2014-11-03 – 2014-11-04 (×2): 50 mg via ORAL
  Filled 2014-11-03 (×2): qty 1

## 2014-11-03 MED ORDER — FENTANYL CITRATE (PF) 100 MCG/2ML IJ SOLN
INTRAMUSCULAR | Status: DC | PRN
  Administered 2014-11-03 (×2): 25 ug via INTRAVENOUS
  Administered 2014-11-03: 50 ug via INTRAVENOUS

## 2014-11-03 MED ORDER — SODIUM CHLORIDE 0.9 % IJ SOLN: 3.0000 mL | INTRAMUSCULAR | Status: DC | PRN

## 2014-11-03 MED ORDER — SODIUM CHLORIDE 0.9 % IV SOLN: 250.0000 mL | INTRAVENOUS | Status: DC | PRN

## 2014-11-03 MED ORDER — LOSARTAN POTASSIUM 50 MG PO TABS
50.0000 mg | ORAL_TABLET | Freq: Every day | ORAL | Status: DC
  Administered 2014-11-03 – 2014-11-04 (×2): 50 mg via ORAL
  Filled 2014-11-03 (×2): qty 1

## 2014-11-03 MED ORDER — SODIUM CHLORIDE 0.9 % IV SOLN: INTRAVENOUS | Status: AC

## 2014-11-03 MED ORDER — HYDRALAZINE HCL 25 MG PO TABS: 25.0000 mg | ORAL_TABLET | Freq: Three times a day (TID) | ORAL | Status: DC

## 2014-11-03 MED ORDER — MIDAZOLAM HCL 2 MG/2ML IJ SOLN
INTRAMUSCULAR | Status: DC | PRN
  Administered 2014-11-03: 2 mg via INTRAVENOUS

## 2014-11-03 MED ORDER — SODIUM CHLORIDE 0.9 % WEIGHT BASED INFUSION
3.0000 mL/kg/h | INTRAVENOUS | Status: AC
  Administered 2014-11-03: 3 mL/kg/h via INTRAVENOUS

## 2014-11-03 SURGICAL SUPPLY — 7 items
CATH INFINITI 5FR ANG PIGTAIL (CATHETERS) ×3 IMPLANT
CATH OPTITORQUE JACKY 4.0 5F (CATHETERS) ×3 IMPLANT
DEVICE RAD TR BAND REGULAR (VASCULAR PRODUCTS) ×3 IMPLANT
GLIDESHEATH SLEND SS 6F .021 (SHEATH) ×3 IMPLANT
KIT MANI 3VAL PERCEP (MISCELLANEOUS) ×3 IMPLANT
PACK CARDIAC CATH (CUSTOM PROCEDURE TRAY) ×3 IMPLANT
WIRE SAFE-T 1.5MM-J .035X260CM (WIRE) ×3 IMPLANT

## 2014-11-03 NOTE — Progress Notes (Signed)
This RN took over care of patient around 1300, patient alert and oriented, vital signs stable, atrial paced on monitor.  Patient back from cath lab without intervention today.  Right radial site wdl.   Pulses at baseline.  Pain med administered x1 per patient's preference earlier this shift with improvement.  Patient self cath, wife at bedside.  Patient currently in no distress. Report given to night RN with no further questions.

## 2014-11-03 NOTE — Progress Notes (Signed)
Questioning when medications are to be administered.  Stated he takes his evening meds at 1830 every evening.  Informed Pt that evening medications are typically scheduled for 2200.  Offered to administer evening meds to Pt who refused.  Will administer according to current schedule.

## 2014-11-03 NOTE — Progress Notes (Signed)
Pt lying in bed wife remained at bedside this shift . Pt denies any further CP . Pt nitro D/c about 3 am. Further assesments via flow sheet

## 2014-11-03 NOTE — Progress Notes (Signed)
   11/03/14 1400  Clinical Encounter Type  Visited With Family;Patient and family together  Visit Type Initial;Social support  Consult/Referral To Chaplain  Spiritual Encounters  Spiritual Needs Other (Comment)  Stress Factors  Patient Stress Factors None identified  Family Stress Factors None identified  Chaplain rounded in the unit and offered a compassionate presence and supported the patient's family as she entered into the unit. Chaplain Abdiel Blackerby A. [name] Ext. [phone]

## 2014-11-03 NOTE — Progress Notes (Signed)
Patient: Donald Juarez / Admit Date: 11/02/2014 / Date of Encounter: 11/03/2014, 10:53 AM   Subjective: Status post cardiac cath on 10/3 that showed significant 2 vessel coronary artery disease with patent stent in the left circumflex with moderate ostial stenosis. There was significant disease affecting the distal RCA bifurcation which seemed to be slightly worse than most recent cardiac catheterization. No obstructive disease involving the LAD. The coronary arteries were overall moderately to severely calcified. Normal LV systolic function and mildly elevated left ventricular end-diastolic pressure. Successful complex angioplasty/drug-eluting stent placement to the distal RCA bifurcation. Post cardiac cath and overnight until 3 AM he had continuous chest pain and left jaw pain. Troponin this morning showed elevation to 10.85. ECG post PCI and with chest pain showed no acute st/t changes.   This morning he continued to note intermittent chest pain, though he intermittently wants to go home 2/2 food.   Review of Systems: Review of Systems  Constitutional: Positive for malaise/fatigue. Negative for fever, chills, weight loss and diaphoresis.  Eyes: Negative for discharge and redness.  Respiratory: Negative for cough, hemoptysis, sputum production, shortness of breath and wheezing.   Cardiovascular: Positive for chest pain. Negative for palpitations, orthopnea, claudication, leg swelling and PND.  Gastrointestinal: Negative for nausea and vomiting.  Musculoskeletal: Negative for falls.  Skin: Negative for rash.  Neurological: Positive for weakness. Negative for sensory change, speech change and focal weakness.  Endo/Heme/Allergies: Bruises/bleeds easily.  Psychiatric/Behavioral: The patient is not nervous/anxious.      Objective: Telemetry: paced Physical Exam: Blood pressure 178/89, pulse 63, temperature 98 F (36.7 C), temperature source Oral, resp. rate 12, height 5\' 9"  (1.753 m),  weight 202 lb (91.627 kg), SpO2 98 %. Body mass index is 29.82 kg/(m^2). General: Well developed, well nourished, in no acute distress. Head: Normocephalic, atraumatic, sclera non-icteric, no xanthomas, nares are without discharge. Neck: Negative for carotid bruits. JVP not elevated. Lungs: Clear bilaterally to auscultation without wheezes, rales, or rhonchi. Breathing is unlabored. Heart: RRR S1 S2 without murmurs, rubs, or gallops.  Abdomen: Soft, non-tender, non-distended with normoactive bowel sounds. No rebound/guarding. Extremities: No clubbing or cyanosis. No edema. Distal pedal pulses are 2+ and equal bilaterally. Cardiac cath site with mild bleeding. Nursing rebandaged with pressure.  Neuro: Alert and oriented X 3. Moves all extremities spontaneously. Psych:  Responds to questions appropriately with a normal affect.   Intake/Output Summary (Last 24 hours) at 11/03/14 1053 Last data filed at 11/03/14 0400  Gross per 24 hour  Intake 1515.8 ml  Output   1550 ml  Net  -34.2 ml    Inpatient Medications:  . arformoterol  15 mcg Nebulization BID  . aspirin EC  81 mg Oral Daily  . atorvastatin  40 mg Oral Daily  . budesonide  0.25 mg Nebulization BID  . Chlorhexidine Gluconate Cloth  6 each Topical Q0600  . cholecalciferol  2,000 Units Oral Daily  . clopidogrel  75 mg Oral Q breakfast  . fentaNYL (SUBLIMAZE) injection  50 mcg Intravenous Once  . isosorbide mononitrate  30 mg Oral TID  . metoprolol tartrate  25 mg Oral BID  . morphine  2 mg Intravenous Once  . mupirocin ointment  1 application Nasal BID  . sodium chloride  3 mL Intravenous Q12H  . tiotropium  18 mcg Inhalation Daily   Infusions:  . nitroGLYCERIN Stopped (11/03/14 0400)    Labs:  Recent Labs  11/03/14 0424  NA 138  K 4.1  CL 110  CO2 22  GLUCOSE 129*  BUN 14  CREATININE 0.71  CALCIUM 8.9   No results for input(s): AST, ALT, ALKPHOS, BILITOT, PROT, ALBUMIN in the last 72 hours.  Recent Labs   11/03/14 0424  WBC 9.1  HGB 15.0  HCT 42.8  MCV 90.6  PLT 199    Recent Labs  11/03/14 0946  TROPONINI 10.85*   Invalid input(s): POCBNP No results for input(s): HGBA1C in the last 72 hours.   Weights: Filed Weights   11/02/14 1059  Weight: 202 lb (91.627 kg)     Radiology/Studies:  No results found.   Assessment and Plan   1. NSTEMI/CAD s/p complex PCI/DES as above: -Troponin elevated to 10.85 this morning post PCI with ongoing chest pain -Started on heparin gtt -Dr. Fletcher Anon, MD planing to take the patient back to the cardiac cath lab this afternoon for a re-look at the recently placed stent -Possibly in the setting of his elevated blood pressure -Would look to control his blood pressure -Continue DAPT with aspirin 81 mg daily and Plavix 75 mg daily -Continue Lopressor 25 mg bid, heart rate precludes further titration at this time -Continue Lipitor 40 mg daily -Continue Imdur 30 mg tid -Continue SL NTG prn -Risks and benefits of cardiac catheterization have been discussed with the patient including risks of bleeding, bruising, infection, kidney damage, stroke, heart attack, and death. The patient understands these risks and is willing to proceed with the procedure. All questions have been answered and concerns listened to.   2. Accelerated HTN: -Possibly playing a role in #1 -Continue Lopressor 25 mg, unable to titrate further given heart rate currently in the low 60's -Continue Imdur 30 mg tid -He has previously had issues with hypotension, leading to cessation of antihypertensives -Current elevation currently could be 2/2 chest pain vs vice versa  -Add hydralazine 25 mg tid with BP running in the 233'K systolic currently  -Morphine 2 mg x 1 as below for hip pain  3. Carotid artery stenosis: -Continue aggressive medical therapy -Recommend repeat carotid doppler as outpatient per prior study   4. Prostate cancer: -Per urologist  5. Anxiety/depression: -Per  PCP  6. History of typical atrial flutter: -Status post ablation 10/2013  7. Sinus node dysfunction: -Status post MDT PPM -Followed by EP  8. History of orthostatic hypotension: -None currently  9. Hip pain: -Morphine 2 mg x 1   Signed, Christell Faith, PA-C Pager: [phone] 11/03/2014, 10:53 AM

## 2014-11-03 NOTE — Progress Notes (Signed)
ANTICOAGULATION CONSULT NOTE - Initial Consult  Pharmacy Consult for Heparin  Indication: chest pain/ACS  No Known Allergies  Patient Measurements: Height: 5\' 9"  (175.3 cm) Weight: 202 lb (91.627 kg) IBW/kg (Calculated) : 70.7 Heparin Dosing Weight: 89.3 kg  Vital Signs: Temp: 98 F (36.7 C) (10/04 0600) Temp Source: Oral (10/04 0600) BP: 173/88 mmHg (10/04 1100) Pulse Rate: 63 (10/04 1100)  Labs:  Recent Labs  11/03/14 0424 11/03/14 0946  HGB 15.0  --   HCT 42.8  --   PLT 199  --   CREATININE 0.71  --   TROPONINI  --  10.85*    Estimated Creatinine Clearance: 96.1 mL/min (by C-G formula based on Cr of 0.71).   Medical History: Past Medical History  Diagnosis Date  . COPD (chronic obstructive pulmonary disease) ([place])   . Carotid stenosis     a. 07/2013 Carotid U/S: 100% RICA (pt prev reported h/o 01%), LICA 60-10%, bilat ECA 50%, normal vertebrals and subclavians bilat-->F/U needed in 01/2014.  [name] Hypertension   . Paroxysmal atrial flutter ([place])     a. CHA2DS2VASc = 3 - on eliquis;  b. 07/2013 Echo: EF 50-55%, mildly dil LA.c. s/p ablation 10/31/2013 by Dr Caryl Comes  . Syncope and collapse   . Coronary artery disease     a. 06/1996 & 03/2005 Cath: non-obs dzs;  b. 07/2009 NSTEMI/Cath: LCX 99->PCI/DES extending into OM1;  c. 01/2013 Cath: LM nl, LAD 30p, 24m, LCX 30ost, 20p ISR, 42m, OM1 patent stent, OM2 30/small, RCA 20/40p, 33m, RPL 75, EF 55%-->Med Rx.; d. cath 03/2014: no evidence of occlusive CAD, patent LCx stent, bifurcation stenosis involving ostium of R AV groove artery, no PCI indicated w/o refractory symptom  . Hyperlipidemia   . Cataract, right     a. 10/2010 s/p cataract surgery   . Shingles     a. 09/2012.  . Pulmonary nodule, right     a. 0.7cm RLL - stable by CT 06/2013.  . Bilateral claudication of lower limb ([place])   . SSS (sick sinus syndrome) ([place])     a. 12/2009 S/P MDT PPM ([name])  . Hyperkalemia   . Presence of permanent cardiac pacemaker   .  Shortness of breath dyspnea   . Asthma   . Myocardial infarction ([place])     2003  . History of radiofrequency ablation procedure for cardiac arrhythmia     9/15  . Seizures ([place])     last 30 years ago  . Rectal cancer ([place])     a. Status post colostomy  . H/O wheezing   . Dysrhythmia   . Prostate cancer ([place])     Medications:  Scheduled:  . arformoterol  15 mcg Nebulization BID  . aspirin EC  81 mg Oral Daily  . atorvastatin  40 mg Oral Daily  . budesonide  0.25 mg Nebulization BID  . Chlorhexidine Gluconate Cloth  6 each Topical Q0600  . cholecalciferol  2,000 Units Oral Daily  . clopidogrel  75 mg Oral Q breakfast  . fentaNYL (SUBLIMAZE) injection  50 mcg Intravenous Once  . heparin  4,000 Units Intravenous Once  . hydrALAZINE  25 mg Oral 3 times per day  . isosorbide mononitrate  30 mg Oral TID  . metoprolol tartrate  25 mg Oral BID  . morphine  2 mg Intravenous Once  .  morphine injection  2 mg Intravenous Once  . mupirocin ointment  1 application Nasal BID  . sodium chloride  3 mL Intravenous  Q12H  . tiotropium  18 mcg Inhalation Daily    Assessment: Patient being treated for NSTEMI/CAD s/p complex PCI/DES with heparin therapy.  Goal of Therapy:  Heparin level 0.3-0.7 units/ml Monitor platelets by anticoagulation protocol: Yes   Plan:  Give 4000 units bolus x 1 Start heparin infusion at 1150 units/hr Check anti-Xa level in 8  hours and daily while on heparin Continue to monitor H&H and platelets  Sabrie Moritz D 11/03/2014,11:30 AM

## 2014-11-03 NOTE — Interval H&P Note (Signed)
History and Physical Interval Note:  11/03/2014 4:06 PM  Donald Juarez  has presented today for surgery, with the diagnosis of NSTEMI  The various methods of treatment have been discussed with the patient and family. After consideration of risks, benefits and other options for treatment, the patient has consented to  Procedure(s): Left Heart Cath and Coronary Angiography (N/A) as a surgical intervention .  The patient's history has been reviewed, patient examined, no change in status, stable for surgery.  I have reviewed the patient's chart and labs.  Questions were answered to the patient's satisfaction.     Kathlyn [place]

## 2014-11-04 ENCOUNTER — Encounter: Payer: Self-pay | Admitting: Cardiovascular Disease

## 2014-11-04 DIAGNOSIS — Z9861 Coronary angioplasty status: Secondary | ICD-10-CM

## 2014-11-04 DIAGNOSIS — I214 Non-ST elevation (NSTEMI) myocardial infarction: Principal | ICD-10-CM

## 2014-11-04 DIAGNOSIS — I200 Unstable angina: Secondary | ICD-10-CM

## 2014-11-04 LAB — CBC
HCT: 41.6 % (ref 40.0–52.0)
HEMOGLOBIN: 14.6 g/dL (ref 13.0–18.0)
MCH: 31.6 pg (ref 26.0–34.0)
MCHC: 35.1 g/dL (ref 32.0–36.0)
MCV: 90 fL (ref 80.0–100.0)
Platelets: 196 10*3/uL (ref 150–440)
RBC: 4.63 MIL/uL (ref 4.40–5.90)
RDW: 14.2 % (ref 11.5–14.5)
WBC: 8.5 10*3/uL (ref 3.8–10.6)

## 2014-11-04 LAB — BASIC METABOLIC PANEL
ANION GAP: 6 (ref 5–15)
BUN: 13 mg/dL (ref 6–20)
CALCIUM: 9.2 mg/dL (ref 8.9–10.3)
CO2: 25 mmol/L (ref 22–32)
Chloride: 109 mmol/L (ref 101–111)
Creatinine, Ser: 0.73 mg/dL (ref 0.61–1.24)
Glucose, Bld: 105 mg/dL — ABNORMAL HIGH (ref 65–99)
Potassium: 4.3 mmol/L (ref 3.5–5.1)
SODIUM: 140 mmol/L (ref 135–145)

## 2014-11-04 MED ORDER — LOSARTAN POTASSIUM 50 MG PO TABS: 25.0000 mg | ORAL_TABLET | Freq: Every day | ORAL | Status: DC

## 2014-11-04 MED ORDER — NITROGLYCERIN 0.4 MG SL SUBL: 0.4000 mg | SUBLINGUAL_TABLET | SUBLINGUAL | Status: DC | PRN

## 2014-11-04 MED ORDER — LOSARTAN POTASSIUM 50 MG PO TABS: 50.0000 mg | ORAL_TABLET | Freq: Every day | ORAL | Status: DC

## 2014-11-04 MED ORDER — CLOPIDOGREL BISULFATE 75 MG PO TABS: 75.0000 mg | ORAL_TABLET | Freq: Every day | ORAL | Status: DC

## 2014-11-04 NOTE — Progress Notes (Signed)
Pt d/c home per MD order, pt VSS, d/c instructions given, all questions answered, pt verbalized understanding of d/c and instructions

## 2014-11-04 NOTE — Discharge Instructions (Signed)
Angina Pectoris Angina pectoris is a very bad feeling in the chest, neck, or arm. Your doctor may call it angina. There are four types of angina. Angina is caused by a lack of blood in the middle and thickest layer of the heart wall (myocardium). Angina may feel like a crushing or squeezing pain in the chest. It may feel like tightness or heavy pressure in the chest. Some people say it feels like gas, heartburn, or indigestion. Some people have symptoms other than pain. These include:  Shortness of breath.  Cold sweats.  Feeling sick to your stomach (nausea).  Feeling light-headed. Many women have chest discomfort and some of the other symptoms. However, women often have different symptoms, such as:  Feeling tired (fatigue).  Feeling nervous for no reason.  Feeling weak for no reason.  Dizziness or fainting. Women may have angina without any symptoms. HOME CARE  Take medicines only as told by your doctor.  Take care of other health issues as told by your doctor. These include:  High blood pressure (hypertension).  Diabetes.  Follow a heart-healthy diet. Your doctor can help you to choose healthy food options and make changes.  Talk to your doctor to learn more about healthy cooking methods and use them. These include:  Roasting.  Grilling.  Broiling.  Baking.  Poaching.  Steaming.  Stir-frying.  Follow an exercise program approved by your doctor.  Keep a healthy weight. Lose weight as told by your doctor.  Rest when you are tired.  Learn to manage stress.  Do not use any tobacco, such as cigarettes, chewing tobacco, or electronic cigarettes. If you need help quitting, ask your doctor.  If you drink alcohol, and your doctor says it is okay, limit yourself to no more than 1 drink per day. One drink equals 12 ounces of beer, 5 ounces of wine, or 1 ounces of hard liquor.  Stop illegal drug use.  Keep all follow-up visits as told by your doctor. This is  important. Do not take these medicines unless your doctor says that you can:  Nonsteroidal anti-inflammatory drugs (NSAIDs). These include:  Ibuprofen.  Naproxen.  Celecoxib.  Vitamin supplements that have vitamin A, vitamin E, or both.  Hormone therapy that contains estrogen with or without progestin. GET HELP RIGHT AWAY IF:  You have pain in your chest, neck, arm, jaw, stomach, or back that:  Lasts more than a few minutes.  Comes back.  Does not get better after you take medicine under your tongue (sublingual nitroglycerin).  You have any of these symptoms for no reason:  Gas, heartburn, or indigestion.  Sweating a lot.  Shortness of breath or trouble breathing.  Feeling sick to your stomach or throwing up.  Feeling more tired than usual.  Feeling nervous or worrying more than usual.  Feeling weak.  Diarrhea.  You are suddenly dizzy or light-headed.  You faint or pass out. These symptoms may be an emergency. Do not wait to see if the symptoms will go away. Get medical help right away. Call your local emergency services (911 in the [place] Do not drive yourself to the hospital.   This information is not intended to replace advice given to you by your health care provider. Make sure you discuss any questions you have with your health care provider.   Document Released: 07/05/2007 Document Revised: 06/02/2014 Document Reviewed: 05/20/2013 Elsevier Interactive Patient Education 2016 [place] cath instructions.

## 2014-11-04 NOTE — Discharge Summary (Signed)
Discharge Summary   Patient ID: Donald Juarez MRN: [id number], DOB/AGE: 1946-01-13 69 y.o. Admit date: 11/02/2014 D/C date:     11/04/2014  Primary Care Provider: [facility], [place] Primary Cardiologist: Dr. Fletcher Anon, MD   Primary Discharge Diagnoses:  NSTEMI post PCI  Secondary Discharge Diagnoses:  1) CAD s/p complex angioplasty/PCI to distal RCA 2) Paroxysmal atrial flutter s/p prior radiofrequency ablation  3) COPD 4) Orthostatic intolerance  5) Anger management  6) Hypotension 7) Carotid artery stenosis 8) Prostate cancer 9) Rectal cancer s/p colostomy     Hospital Course:  69 year old male with history of CAD s/p prior stenting to LCx in 2011 in the setting of NSTEMI, paroxysmal atrial flutter with prior radiofrequency ablation on 10/31/2013, difficult to manage COPD, systolic HTN, orthostatic intolerance, carotid stenosis chronically occluded on the right side, prior syncope and collapse, SSS s/p MDT PPM in 12/2009, asthma with chronic SOB, rectal cancer s/p post colostomy, anger management issues, and recently diagnosed prostate cancer who was recently seen in clinic on 9/26 as a work in for increased blood pressure and SOB over the week prior.   Patient underwent cardiac cath in 03/2014 in the setting of worsening exertional dyspnea and chest pain. Cath showed patent LCx stents, 80% stenosis in the ostial right AV grove artery at the bifurcation of the PDA. This was not an optimal location for PCI and supplied a relatively small area. Thus, it was felt that the risk of PCI outweighed the benefit. LVEDP was 22. He was started on low dose Lasix and seen by Dr. Lake Bells for optimization of his COPD with some improvement. However, he continued to feel bad almost on a daily basis. He had been under a signifianct amount of stress due to his wife being disabled. He underwent Myoview in 05/2014 that showed no ischemia with an EF of 55%.    Recently seen by Dr. Caryl Comes, MD 10/13/2014 for  cardiac clearance secondary to his recently diagnosed prostate cancer. He was cleared for surgery at that time. His BP was 146/85. He was euvolemic as well. His orthostasis had much improved.   He called his pulmonologist's office on 9/19 stating his nebulizer machine had been causing him some issues. An order was placed for a new one.   He called our office on 9/26 stating he was SOB the week prior and that his BP was elevated around 220/173, with associated left sided chest pain. He took a SL NTG and both his chest pain and BP improved. On 9/24 his BP was 175/80. On 9/26 his BP was in the 580'D systolic. He had self titrated his metoprolol initially from 12.5 mg bid to 12.5 mg q AM to 25 mg q PM, then to 25 mg bid and his Imdur from 30 mg qhs to 30 mg bid. He reported he had not been "feeling well," stating he felt like he was "hit by a truck." He had not had any further chest pain since the episode on 9/20, though he continued to feel tried and not quite like himself. BP typically runs in the 983'J systolic. The above SL NTG was the only one he has had to take, and only the third one he had ever taken. He does have a lot on his plate currently as he will be seeing the urologist for discussion on the management of his recently diagnosed prostate cancer. He previously amitted to depression and anxiety. He was set up for outpatient  diagnostic cardiac cath.  He presented to [facility] on 10/3 that showed significant 2 vessel CAD with patent stent in the LCx with moderate ostial stenosis. There was significant disease affecting the distal RCA bifurcation which seemed to be slightly worse than the most recent cardiac cath. No obstructive disease involving the LAD. The coronary arteries were overall moderately to severely calcified. Normal LV systolic function and mildly elevated LVEDP. He underwent successful complex angioplasty/DES to the distal RCA bifurcation. Cath details in full below. He continued to have chest  pain radiating to the jaw post PCI. Multiple ECG's were checked by staff and found to be non-acute by cardiology upon rounding the following day. Chest pain resolved with nitro gtt. He continued to complain to the nursing staff throughout his stay. Troponin was checked by cardiology on the morning of 10/4 and found to be 10.85. He was placed on heparin gtt and taken to the cath lab for a re-look. Cardiac cath on 10/4 showed subtotal occlusion of the right posterior AV groove artery at the site of balloon angioplasty from 10/3 and at the origin of the right PDA. There was TIMI 2 flow in that area. This was felt to be the culprit for his NSTEMI post PCI. There was mildly reduced LV systolic function with basal inferior wall hypokinesis and EF of 45-50%. Normal LVEDP. It was recommended that given that was a bifurcation area, intervention might adversely affect the ostial PDA stent which serves as bigger territory. The supplied area is small to medium in size and the patient was chest pain free post re-look. Thus, it was felt the risks of PCI outweigh the benefits at the present time. He should maintain optimal blood pressure control. His lopressor was increased to 25 mg bid and losartan 25 mg was added. For DAPT he will be taking aspirin 81 mg daily and Plavix 75 mg daily. Post cardiac labs showed stable SCr of 0.73, hgb 14.6, and plt 196. Post re-look cardiac cath he has been chest pain free and is ready to go home. He was seen by Dr. Ellyn Hack, MD today and felt to be stable for discharge.   Consults: None.  Discharge Vitals: Blood pressure 132/71, pulse 65, temperature 98.3 F (36.8 C), temperature source Oral, resp. rate 17, height 5\' 9"  (1.753 m), weight 202 lb (91.627 kg), SpO2 95 %.  Labs: Lab Results  Component Value Date   WBC 8.5 11/04/2014   HGB 14.6 11/04/2014   HCT 41.6 11/04/2014   MCV 90.0 11/04/2014   PLT 196 11/04/2014     Recent Labs Lab 11/04/14 0405  NA 140  K 4.3  CL 109  CO2  25  BUN 13  CREATININE 0.73  CALCIUM 9.2  GLUCOSE 105*    Recent Labs  11/03/14 0946  TROPONINI 10.85*   No results found for: CHOL, HDL, LDLCALC, TRIG No results found for: DDIMER  Diagnostic Studies/Procedures   Cardiac cath 11/02/2014:   LM lesion, 30% stenosed.  Dist Cx lesion, 60% stenosed.  Prox LAD to Mid LAD lesion, 20% stenosed.  Ost Cx to Prox Cx lesion, 50% stenosed.  Prox Cx lesion, 10% stenosed. The lesion was previously treated with a stent (unknown type) greater than two years ago.  2nd Mrg lesion, 50% stenosed.  Ost RCA lesion, 30% stenosed.  Mid RCA lesion, 40% stenosed.  Dist RCA lesion, 60% stenosed.  The left ventricular systolic function is normal.  Post Atrio lesion, 90% stenosed. There is a 40% residual stenosis post  intervention.  Ost RPDA lesion, 70% stenosed. There is a 0% residual stenosis post intervention.  A drug-eluting stent was placed.  1. Significant 2 vessel coronary artery disease with patent stent in the left circumflex with moderate ostial stenosis. There is significant disease affecting the distal RCA bifurcation which seems to be slightly worse than most recent cardiac catheterization. No obstructive disease involving the LAD. The coronary arteries are overall moderately to severely calcified. 2. Normal LV systolic function and mildly elevated left ventricular end-diastolic pressure. 3. Successful complex angioplasty/drug-eluting stent placement to the distal RCA bifurcation.  Recommendations: Dual antiplatelets therapy for at least one year. Aggressive treatment of risk factors is recommended.  Cardiac cath 11/03/2014:   LM lesion, 30% stenosed.  Dist Cx lesion, 60% stenosed.  Prox LAD to Mid LAD lesion, 20% stenosed.  Ost Cx to Prox Cx lesion, 50% stenosed.  Prox Cx lesion, 10% stenosed. The lesion was previously treated with a stent (unknown type) greater than two years ago.  2nd Mrg lesion, 50%  stenosed.  Ost RCA lesion, 30% stenosed.  Mid RCA lesion, 40% stenosed.  Dist RCA lesion, 60% stenosed.  A drug-eluting stent was placed.  Post Atrio-2 lesion, 40% stenosed. The lesion was previously treated with angioplasty .  Post Atrio-1 lesion, 99% stenosed.  There is mild left ventricular systolic dysfunction.  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 There is TIMI 2 flow in that area. This is the culprit for non-ST elevation myocardial infarction post PCI.  2. Mildly reduced LV systolic function with basal inferior wall hypokinesis and ejection fraction of 45-50%. Normal left ventricular end-diastolic pressure.  Recommendations: Given that this is a bifurcation area, intervention might adversely affect the ostial right PDA stent which serves as a bigger territory. The supplied area is small to medium in size and the patient is currently chest pain-free. Thus, given the above, I think the risks of PCI outweigh the benefits at the present time. Recommend optimal blood pressure control. I increased the dose of metoprolol and added losartan.  Possible discharge home tomorrow if blood pressure is controlled and chest pain-free.  Discharge Medications   Discharge Medication List as of 11/04/2014 10:27 AM    START taking these medications   Details  clopidogrel (PLAVIX) 75 MG tablet Take 1 tablet (75 mg total) by mouth daily with breakfast., Starting 11/04/2014, Until Discontinued, Normal    !! nitroGLYCERIN (NITROSTAT) 0.4 MG SL tablet Place 1 tablet (0.4 mg total) under the tongue every 5 (five) minutes as needed for chest pain., Starting 11/04/2014, Until Discontinued, Normal     !! - Potential duplicate medications found. Please discuss with provider.    CONTINUE these medications which have CHANGED   Details  losartan (COZAAR) 50 MG tablet Take 0.5 tablets (25 mg total)  by mouth daily., Starting 11/04/2014, Until Discontinued, Normal      CONTINUE these medications which have NOT CHANGED   Details  albuterol (PROVENTIL) (2.5 MG/3ML) 0.083% nebulizer solution Take 3 mLs (2.5 mg total) by nebulization every 6 (six) hours as needed for wheezing or shortness of breath. DX code 493.20, Starting 12/30/2013, Until Discontinued, Normal    arformoterol (BROVANA) 15 MCG/2ML NEBU Take 2 mLs (15 mcg total) by nebulization 2 (two) times daily. DX code 493.20, Starting 09/23/2013, Until Discontinued, Historical Med    aspirin 81 MG tablet Take 81 mg by mouth daily., Until Discontinued,  Historical Med    atorvastatin (LIPITOR) 40 MG tablet Take 40 mg by mouth daily., Until Discontinued, Historical Med    budesonide (PULMICORT) 0.25 MG/2ML nebulizer solution Take 0.25 mg by nebulization 2 (two) times daily., Until Discontinued, Historical Med    Cholecalciferol (VITAMIN D) 2000 UNITS tablet Take 2,000 Units by mouth daily., Until Discontinued, Historical Med    isosorbide mononitrate (IMDUR) 60 MG 24 hr tablet Take 0.5 tablets (30 mg total) by mouth 3 (three) times daily., Starting 10/26/2014, Until Discontinued, Normal    metoprolol tartrate (LOPRESSOR) 25 MG tablet Take 1 tablet (25 mg total) by mouth 2 (two) times daily., Starting 05/11/2014, Until Discontinued, Normal    !! nitroGLYCERIN (NITROSTAT) 0.3 MG SL tablet Place 1 tablet (0.3 mg total) under the tongue every 5 (five) minutes as needed for chest pain., Starting 03/20/2014, Until Discontinued, Normal    nystatin cream (MYCOSTATIN) Use as directed by doctor, Starting 09/28/2014, Until Discontinued, Historical Med    tiotropium (SPIRIVA) 18 MCG inhalation capsule Place 1 capsule (18 mcg total) into inhaler and inhale daily., Starting 08/28/2013, Until Discontinued, Print    traMADol (ULTRAM) 50 MG tablet Take 50 mg by mouth daily as needed., Starting 09/14/2014, Until Discontinued, Historical Med     !! - Potential  duplicate medications found. Please discuss with provider.      Disposition   The patient will be discharged in stable condition to home. Discharge Instructions    AMB referral to cardiac rehabilitation    Complete by:  As directed   Congestive Heart Failure: If diagnosis is Heart Failure, patient MUST meet each of the CMS criteria: 1. Left Ventricular Ejection Fraction </= 35% 2. NYHA class II-IV symptoms despite being on optimal heart failure therapy for at least 6 weeks. 3. Stable = have not had a recent (<6 weeks) or planned (<6 months) major cardiovascular hospitalization or procedure  Program Details: - Physician supervised classes - 1-3 classes per week over a 12-18 week period, generally for a total of 36 sessions  Physician Certification: I certify that the above Cardiac Rehabilitation treatment is medically necessary and is medically approved by me for treatment of this patient. The patient is willing and cooperative, able to ambulate and medically stable to participate in exercise rehabilitation. The participant's progress and Individualized Treatment Plan will be reviewed by the Medical Director, Cardiac Rehab staff and as indicated by the Referring/Ordering Physician.  Diagnosis:  PCI          Follow-up Information    Follow up with Murray Hodgkins, NP. Go on 11/12/2014.   Specialties:  Nurse Practitioner, Cardiology, Radiology   Why:  APPT Time: 1:30 PM   Contact information:   [place] [id number] [phone]       Please follow up.   Why:  Follow up appointment scheduled for Oct. 13         Duration of Discharge Encounter: Greater than 30 minutes including physician and PA time.  SignedChristell Faith, PA-C Pager: [phone] 11/04/2014, 5:50 PM  I saw the patient on the day of discharge. He was doing well. Exam was benign. No recurrent anginal pain.  He was very agitated and angry about having to wait for discharge --  upon my arrival to the floor, I overheard that he was planning to leave AMA. He was upset about multiple things, not the least of which was his in and out catheters. He voiced consternation about not being discharged  yesterday.  After a brief encounter and cursory physical exam, he was stable for discharge. I therefore performed his discharge paperwork to allow him to leave as a discharge patient instead of AMA.  BP 132/71 mmHg  Pulse 65  Temp(Src) 98.3 F (36.8 C) (Oral)  Resp 17  Ht 5\' 9"  (1.753 m)  Wt 202 lb (91.627 kg)  BMI 29.82 kg/m2  SpO2 95%  General: Well developed, well nourished, in no acute distress. Head: Normocephalic, atraumatic, sclera non-icteric, no xanthomas, nares are without discharge. Neck: Negative for carotid bruits. JVP not elevated. Lungs: Clear bilaterally to auscultation without wheezes, rales, or rhonchi. Breathing is unlabored. Heart: RRR S1 S2 without murmurs, rubs, or gallops.  Abdomen: Soft, non-tender, non-distended with normoactive bowel sounds. No rebound/guarding. Extremities: No clubbing or cyanosis. No edema. Distal pedal pulses are 2+ and equal bilaterally. Cardiac cath site with mild bleeding. Nursing rebandaged with pressure.  Neuro: Alert and oriented X 3. Moves all extremities spontaneously. Psych: Responds to questions appropriately with an - irritable affect.   He will be discharged today on a reduced dose of losartan.  Close follow-up has been arranged.   Leonie Man, M.D., M.S. Interventional Cardiologist   Pager # [phone]

## 2014-11-04 NOTE — Progress Notes (Signed)
Pt verbalized plan to leave AMA if not d/c by 1000. MD/N of pt's agitation and plan, nursing will cont to monitor

## 2014-11-05 ENCOUNTER — Telehealth: Payer: Self-pay | Admitting: *Deleted

## 2014-11-05 NOTE — Telephone Encounter (Signed)
Pt states that he had got some stents put in Monday Bp 11/04/14 pm 107/60                            112/68      11/05/14 am 107/60 something   139/75                 3pm 109/67 Took the medication today but is not sure which ones to take.  Is he suppose to take his metoprolol and Imdur  He was just taking the Losartan so he is only taking this one.  He would like someone to call him today, he is not feeling well he states.  Please advise

## 2014-11-05 NOTE — Telephone Encounter (Signed)
S/w pt who states he needs clarification of discharge medications. Reviewed medications listed in  Discharge Summary notes which includes metoprolol and losartan  Pt states his BP is low and inquires as to whether he should take both as he took only losartan today and reports BPs listed below. Feels if he takes metoprolol, his BP will drop too low and he will become dizzy. Suggested he continue to monitor BP tonight, hold metoprolol if BP/HR low or pt is symptomatic, and call tomorrow with readings. Pt verbalized understanding and will report findings tomorrow.

## 2014-11-10 ENCOUNTER — Ambulatory Visit: Payer: Medicare Other | Admitting: Internal Medicine

## 2014-11-12 ENCOUNTER — Encounter: Payer: Self-pay | Admitting: Nurse Practitioner

## 2014-11-12 ENCOUNTER — Ambulatory Visit: Payer: Medicare Other | Admitting: Nurse Practitioner

## 2014-11-12 DIAGNOSIS — I495 Sick sinus syndrome: Secondary | ICD-10-CM | POA: Insufficient documentation

## 2014-11-12 DIAGNOSIS — E785 Hyperlipidemia, unspecified: Secondary | ICD-10-CM | POA: Insufficient documentation

## 2014-11-12 DIAGNOSIS — I10 Essential (primary) hypertension: Secondary | ICD-10-CM | POA: Insufficient documentation

## 2014-12-08 ENCOUNTER — Telehealth: Payer: Self-pay | Admitting: *Deleted

## 2014-12-08 NOTE — Telephone Encounter (Signed)
Lm to call our office to schedule a carotid u/s (6 mth f/u).

## 2014-12-29 ENCOUNTER — Encounter: Payer: Self-pay | Admitting: *Deleted

## 2015-01-06 ENCOUNTER — Telehealth: Payer: Self-pay | Admitting: Cardiovascular Disease

## 2015-01-06 NOTE — Telephone Encounter (Signed)
3 attempts to schedule from recall list.  LMOV to call office.  Deleting recall.   °

## 2015-01-19 ENCOUNTER — Other Ambulatory Visit: Payer: Self-pay | Admitting: Internal Medicine

## 2015-01-19 DIAGNOSIS — I6523 Occlusion and stenosis of bilateral carotid arteries: Secondary | ICD-10-CM

## 2015-01-20 ENCOUNTER — Ambulatory Visit: Payer: Medicare Other

## 2015-01-20 DIAGNOSIS — I6523 Occlusion and stenosis of bilateral carotid arteries: Secondary | ICD-10-CM | POA: Diagnosis not present

## 2015-01-26 ENCOUNTER — Other Ambulatory Visit: Payer: Self-pay | Admitting: Physician Assistant

## 2015-02-04 ENCOUNTER — Other Ambulatory Visit: Payer: Self-pay

## 2015-02-04 MED ORDER — ALBUTEROL SULFATE (2.5 MG/3ML) 0.083% IN NEBU: 2.5000 mg | INHALATION_SOLUTION | Freq: Four times a day (QID) | RESPIRATORY_TRACT | Status: DC | PRN

## 2015-02-19 ENCOUNTER — Ambulatory Visit (INDEPENDENT_AMBULATORY_CARE_PROVIDER_SITE_OTHER): Payer: Medicare Other | Admitting: Internal Medicine

## 2015-02-19 ENCOUNTER — Encounter: Payer: Self-pay | Admitting: Internal Medicine

## 2015-02-19 VITALS — BP 122/68 | HR 75 | Ht 70.0 in | Wt 202.0 lb

## 2015-02-19 DIAGNOSIS — J449 Chronic obstructive pulmonary disease, unspecified: Secondary | ICD-10-CM | POA: Diagnosis not present

## 2015-02-19 DIAGNOSIS — Z23 Encounter for immunization: Secondary | ICD-10-CM

## 2015-02-19 MED ORDER — AZITHROMYCIN 250 MG PO TABS: ORAL_TABLET | ORAL | Status: AC

## 2015-02-19 MED ORDER — UMECLIDINIUM BROMIDE 62.5 MCG/INH IN AEPB: 1.0000 | INHALATION_SPRAY | Freq: Every day | RESPIRATORY_TRACT | Status: DC

## 2015-02-19 MED ORDER — BUDESONIDE 0.25 MG/2ML IN SUSP
0.5000 mg | Freq: Two times a day (BID) | RESPIRATORY_TRACT | Status: DC
Start: 2015-02-19 — End: 2015-02-19

## 2015-02-19 MED ORDER — BUDESONIDE 0.25 MG/2ML IN SUSP: 0.5000 mg | Freq: Two times a day (BID) | RESPIRATORY_TRACT | Status: DC

## 2015-02-19 MED ORDER — UMECLIDINIUM BROMIDE 62.5 MCG/INH IN AEPB: 1.0000 | INHALATION_SPRAY | Freq: Every day | RESPIRATORY_TRACT | Status: AC

## 2015-02-19 NOTE — Addendum Note (Signed)
Addended by: Oscar La R on: 02/19/2015 12:07 PM   Modules accepted: Orders

## 2015-02-19 NOTE — Progress Notes (Signed)
   Subjective:    Patient ID: ALASTER GASQUE, male    DOB: [birth year], 70 y.o.   MRN: [id number]  Synopsis: Mr. Burnett first saw the [facility] Pulmonary clinic in 2014 for COPD after smoking 1 PPD for 30 years and quitting in 2010.  He has frequent exacerbations.    03/2013 Full PFT> Ratio 53% FEV1 1.89 (65% pred, 13% change), TLC 5.83 L (93% pred), DLCO 16.6 (69% pred) Quit tobacco 2010 Recent Cardiac Stent 11/16  CC: chronic SOB, follow up COPD  HPI Patient has been on 4 prednisone tapers for last 6 months Has chronic SOB, DOE. 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  HENT: Negative for congestion.   Respiratory: Positive for cough, shortness of breath and wheezing. Negative for chest tightness.   Cardiovascular: Negative for chest pain, palpitations and leg swelling.  All other systems reviewed and are negative.      Objective:   Physical Exam  Constitutional: He is oriented to person, place, and time. He appears well-developed and well-nourished. No distress.  HENT:  Head: Normocephalic and atraumatic.  Mouth/Throat: No oropharyngeal exudate.  Eyes: EOM are normal.  Cardiovascular: Normal rate, regular rhythm and normal heart sounds.   No murmur heard. Pulmonary/Chest: No stridor. No respiratory distress. He has no wheezes.  Musculoskeletal: Normal range of motion. He exhibits no edema.  Neurological: He is alert and oriented to person, place, and time.  Skin: Skin is warm. He is not diaphoretic.  Psychiatric: He has a normal mood and affect.   Filed Vitals:   02/19/15 1109  BP: 122/68  Pulse: 75  Height: 5\' 10"  (1.778 m)  Weight: 202 lb (91.627 kg)  SpO2: 96%      03/12/2014 left heart catheterization reviewed> moderately elevated LVEDP, patent stents, occlusion noted in a bifurcation in the RCA territory, LVEF 65% 05/10/2014 chest x-ray images personally reviewed there are no pulmonary infiltrates but there is emphysema bilaterally pacemaker in place    Assessment & Plan:  70 yo white male with end stage COPD with chronic SOB and recurrent exacerbations COPD, severe GOLD GRADE D  1.will increased dose of pulmicort neb to 0.5mg  2.start Incruse, stop spiriva 3.start zithromax 250 mg daily for COPD exac prevention 4.continue pulmciort/LABA(brovana) as prescribed 5.check 6 Minute walk test 6.check overnight pulse ox  Follow up one month to assess interval changes  The Patient requires high complexity decision making for assessment and support, frequent evaluation and titration of therapies. Patient  satisfied with Plan of action and management. All questions answered  Corrin Parker, M.D.  [facility]  Medical Director [place] Director [facility] Cardio-Pulmonary Department

## 2015-02-19 NOTE — Patient Instructions (Signed)
Chronic Obstructive Pulmonary Disease Chronic obstructive pulmonary disease (COPD) is a common lung condition in which airflow from the lungs is limited. COPD is a general term that can be used to describe many different lung problems that limit airflow, including both chronic bronchitis and emphysema. If you have COPD, your lung function will probably never return to normal, but there are measures you can take to improve lung function and make yourself feel better. CAUSES   Smoking (common).  Exposure to secondhand smoke.  Genetic problems.  Chronic inflammatory lung diseases or recurrent infections. SYMPTOMS  Shortness of breath, especially with physical activity.  Deep, persistent (chronic) cough with a large amount of thick mucus.  Wheezing.  Rapid breaths (tachypnea).  Gray or bluish discoloration (cyanosis) of the skin, especially in your fingers, toes, or lips.  Fatigue.  Weight loss.  Frequent infections or episodes when breathing symptoms become much worse (exacerbations).  Chest tightness. DIAGNOSIS Your health care provider will take a medical history and perform a physical examination to diagnose COPD. Additional tests for COPD may include:  Lung (pulmonary) function tests.  Chest X-ray.  CT scan.  Blood tests. TREATMENT  Treatment for COPD may include:  Inhaler and nebulizer medicines. These help manage the symptoms of COPD and make your breathing more comfortable.  Supplemental oxygen. Supplemental oxygen is only helpful if you have a low oxygen level in your blood.  Exercise and physical activity. These are beneficial for nearly all people with COPD.  Lung surgery or transplant.  Nutrition therapy to gain weight, if you are underweight.  Pulmonary rehabilitation. This may involve working with a team of health care providers and specialists, such as respiratory, occupational, and physical therapists. HOME CARE INSTRUCTIONS  Take all medicines  (inhaled or pills) as directed by your health care provider.  Avoid over-the-counter medicines or cough syrups that dry up your airway (such as antihistamines) and slow down the elimination of secretions unless instructed otherwise by your health care provider.  If you are a smoker, the most important thing that you can do is stop smoking. Continuing to smoke will cause further lung damage and breathing trouble. Ask your health care provider for help with quitting smoking. He or she can direct you to community resources or hospitals that provide support.  Avoid exposure to irritants such as smoke, chemicals, and fumes that aggravate your breathing.  Use oxygen therapy and pulmonary rehabilitation if directed by your health care provider. If you require home oxygen therapy, ask your health care provider whether you should purchase a pulse oximeter to measure your oxygen level at home.  Avoid contact with individuals who have a contagious illness.  Avoid extreme temperature and humidity changes.  Eat healthy foods. Eating smaller, more frequent meals and resting before meals may help you maintain your strength.  Stay active, but balance activity with periods of rest. Exercise and physical activity will help you maintain your ability to do things you want to do.  Preventing infection and hospitalization is very important when you have COPD. Make sure to receive all the vaccines your health care provider recommends, especially the pneumococcal and influenza vaccines. Ask your health care provider whether you need a pneumonia vaccine.  Learn and use relaxation techniques to manage stress.  Learn and use controlled breathing techniques as directed by your health care provider. Controlled breathing techniques include:  Pursed lip breathing. Start by breathing in (inhaling) through your nose for 1 second. Then, purse your lips as if you were   going to whistle and breathe out (exhale) through the  pursed lips for 2 seconds.  Diaphragmatic breathing. Start by putting one hand on your abdomen just above your waist. Inhale slowly through your nose. The hand on your abdomen should move out. Then purse your lips and exhale slowly. You should be able to feel the hand on your abdomen moving in as you exhale.  Learn and use controlled coughing to clear mucus from your lungs. Controlled coughing is a series of short, progressive coughs. The steps of controlled coughing are: 1. Lean your head slightly forward. 2. Breathe in deeply using diaphragmatic breathing. 3. Try to hold your breath for 3 seconds. 4. Keep your mouth slightly open while coughing twice. 5. Spit any mucus out into a tissue. 6. Rest and repeat the steps once or twice as needed. SEEK MEDICAL CARE IF:  You are coughing up more mucus than usual.  There is a change in the color or thickness of your mucus.  Your breathing is more labored than usual.  Your breathing is faster than usual. SEEK IMMEDIATE MEDICAL CARE IF:  You have shortness of breath while you are resting.  You have shortness of breath that prevents you from:  Being able to talk.  Performing your usual physical activities.  You have chest pain lasting longer than 5 minutes.  Your skin color is more cyanotic than usual.  You measure low oxygen saturations for longer than 5 minutes with a pulse oximeter. MAKE SURE YOU:  Understand these instructions.  Will watch your condition.  Will get help right away if you are not doing well or get worse.   This information is not intended to replace advice given to you by your health care provider. Make sure you discuss any questions you have with your health care provider.   Document Released: 10/26/2004 Document Revised: 02/06/2014 Document Reviewed: 09/12/2012 Elsevier Interactive Patient Education 2016 Elsevier Inc.  

## 2015-02-25 ENCOUNTER — Encounter: Payer: Self-pay | Admitting: Internal Medicine

## 2015-03-02 ENCOUNTER — Other Ambulatory Visit: Payer: Self-pay | Admitting: Physician Assistant

## 2015-03-03 ENCOUNTER — Telehealth: Payer: Self-pay

## 2015-03-03 DIAGNOSIS — J449 Chronic obstructive pulmonary disease, unspecified: Secondary | ICD-10-CM

## 2015-03-03 NOTE — Telephone Encounter (Signed)
Pt. Called back and was informed of need of 02 @ night.  Pt. Understood. Nothing further needed at this time.  Pt. also States his incruse is helping his breathing but is causing more mucus clear in color.

## 2015-03-03 NOTE — Telephone Encounter (Signed)
Called patient in regards to ONO left message for pt. To call back.

## 2015-03-12 ENCOUNTER — Telehealth: Payer: Self-pay | Admitting: Internal Medicine

## 2015-03-12 MED ORDER — BUDESONIDE 0.5 MG/2ML IN SUSP: 0.5000 mg | Freq: Two times a day (BID) | RESPIRATORY_TRACT | Status: DC

## 2015-03-12 NOTE — Telephone Encounter (Signed)
Rx for budesonide 0.5 mg/71ml nebulizer solution Rx faxed to [facility] at [phone].  Called and spoke with Eduardo Osier at [facility] to advise that Rx has been faxed. Confirmation received via faxed. Nothing else needed at this time. Rhonda J Cobb

## 2015-03-12 NOTE — Telephone Encounter (Signed)
rx printed. Please fax to [place].

## 2015-03-12 NOTE — Telephone Encounter (Signed)
Ok

## 2015-03-12 NOTE — Telephone Encounter (Signed)
Please advise if it is ok for pt to do 1 vial of Pulmicort BID instead of 2 vials BID. If so, I will print a script for you to sign so that I can fax it to [facility]. Thanks.

## 2015-03-12 NOTE — Telephone Encounter (Signed)
Per 02/19/15 OV: 1.will increased dose of pulmicort neb to 0.5mg  ---  Spoke with mandy from [facility]. She reports they have an order for pt pulmicort 0.25 dosage take 4 ml's BID. Pt does not want to use 2 vials and requesting we change RX to pulmicort 0.6 1 vial BID. Misty, can you have Dr. Mortimer Fries sign a new RX for pulmicort and fax to [facility]? thanks

## 2015-03-15 ENCOUNTER — Ambulatory Visit (INDEPENDENT_AMBULATORY_CARE_PROVIDER_SITE_OTHER): Payer: Medicare Other | Admitting: *Deleted

## 2015-03-15 DIAGNOSIS — J432 Centrilobular emphysema: Secondary | ICD-10-CM | POA: Diagnosis not present

## 2015-03-15 NOTE — Progress Notes (Signed)
SMW performed today. 

## 2015-03-16 ENCOUNTER — Ambulatory Visit (INDEPENDENT_AMBULATORY_CARE_PROVIDER_SITE_OTHER): Payer: Medicare Other | Admitting: Internal Medicine

## 2015-03-16 ENCOUNTER — Encounter: Payer: Self-pay | Admitting: Internal Medicine

## 2015-03-16 VITALS — BP 124/72 | HR 73 | Ht 69.0 in | Wt 201.5 lb

## 2015-03-16 DIAGNOSIS — R079 Chest pain, unspecified: Secondary | ICD-10-CM | POA: Diagnosis not present

## 2015-03-16 DIAGNOSIS — I4891 Unspecified atrial fibrillation: Secondary | ICD-10-CM

## 2015-03-16 DIAGNOSIS — R6884 Jaw pain: Secondary | ICD-10-CM | POA: Diagnosis not present

## 2015-03-16 DIAGNOSIS — Z950 Presence of cardiac pacemaker: Secondary | ICD-10-CM | POA: Diagnosis not present

## 2015-03-16 LAB — CUP PACEART INCLINIC DEVICE CHECK
Battery Impedance: 775 Ohm
Brady Statistic AP VP Percent: 2 %
Brady Statistic AS VP Percent: 1 %
Brady Statistic AS VS Percent: 28 %
Implantable Lead Implant Date: 20111219
Implantable Lead Location: 753859
Implantable Lead Model: 5594
Lead Channel Impedance Value: 611 Ohm
Lead Channel Pacing Threshold Amplitude: 3.25 V
Lead Channel Pacing Threshold Pulse Width: 1.5 ms
Lead Channel Sensing Intrinsic Amplitude: 1.4 mV
Lead Channel Sensing Intrinsic Amplitude: 5.6 mV
Lead Channel Setting Sensing Sensitivity: 2 mV
MDC IDC LEAD IMPLANT DT: 20111219
MDC IDC LEAD LOCATION: 753860
MDC IDC MSMT BATTERY REMAINING LONGEVITY: 26 mo
MDC IDC MSMT BATTERY VOLTAGE: 2.78 V
MDC IDC MSMT LEADCHNL RA IMPEDANCE VALUE: 466 Ohm
MDC IDC MSMT LEADCHNL RV PACING THRESHOLD AMPLITUDE: 0.5 V
MDC IDC MSMT LEADCHNL RV PACING THRESHOLD PULSEWIDTH: 0.4 ms
MDC IDC SESS DTM: 20170214173516
MDC IDC SET LEADCHNL RA PACING AMPLITUDE: 4.5 V
MDC IDC SET LEADCHNL RV PACING AMPLITUDE: 2 V
MDC IDC SET LEADCHNL RV PACING PULSEWIDTH: 0.4 ms
MDC IDC STAT BRADY AP VS PERCENT: 69 %

## 2015-03-16 MED ORDER — ISOSORBIDE MONONITRATE ER 120 MG PO TB24
120.0000 mg | ORAL_TABLET | Freq: Every day | ORAL | Status: DC
Start: 2015-03-16 — End: 2015-08-10

## 2015-03-16 MED ORDER — METOPROLOL TARTRATE 50 MG PO TABS: 50.0000 mg | ORAL_TABLET | Freq: Two times a day (BID) | ORAL | Status: DC

## 2015-03-16 NOTE — Patient Instructions (Signed)
Medication Instructions: 1) Stop Cozaar (Losartan) 2) Increase Lopressor (metoprolol tartrate) to 50 mg one tablet by mouth twice daily 3) Increase Imdur (Isosorbide MN) to 120 mg once daily  Labwork: - none  Procedures/Testing: - none  Follow-Up: - Remote monitoring is used to monitor your Pacemaker of ICD from home. This monitoring reduces the number of office visits required to check your device to one time per year. It allows [place] to keep an eye on the functioning of your device to ensure it is working properly. You are scheduled for a device check from home on 06/15/15. You may send your transmission at any time that day. If you have a wireless device, the transmission will be sent automatically. After your physician reviews your transmission, you will receive a postcard with your next transmission date.  - Your physician wants you to follow-up in: 6 months with Dr. Caryl Comes. You will receive a reminder letter in the mail two months in advance. If you don't receive a letter, please call our office to schedule the follow-up appointment.  Any Additional Special Instructions Will Be Listed Below (If Applicable).     If you need a refill on your cardiac medications before your next appointment, please call your pharmacy.

## 2015-03-16 NOTE — Progress Notes (Signed)
Patient Care Team: Sofie Hartigan, MD as PCP - General (Family Medicine)   HPI  Donald Juarez is a 70 y.o. male Seen in followup for pacemaker implanted 2011 for sinus node dysfunction.  He also has a history of atrial flutter for which he underwent ablation 10/16   He also has problems with systolic hypertension and orthostatic intolerance; Amlodipoine was stopped and imdur decreased   Cath 2/16  Patent  left circumflex stents. There was 80% stenosis in the ostial right AV groove artery at the bifurcation of the PDA. This was not an optimal location for PCI and supplied a relatively small area Felt that the risk of PCI outweighs the benefit. Left ventricular end-diastolic pressure was 22. Myoview 4/16>> no ischemia EF 55%   I have reviewed the son of October 2016. Because of ongoing discomfort he underwent catheterization her complex bifurcation lesion in the RCA distribution. He had protracted post intervention chest pain peak troponin was greater than 10 catheterization demonstrated subtotal occlusion of the POBA site and it was felt that repeat intervention L greater risk than benefit   He felt like he was not treated well during his hospitalization, that he was misled by the providers that he was not seen carefully. He notes that he is prone to anger and irritability. This is worse. He has been on antidepressants in the past. He discuss this with his PCP about 6 months ago and may try something which didn't work so he stopped. He did not follow-up.     His prostate problem has been identified as prostate cancer; he has had recurrent urinary tract infections. Radiation therapy is being reconsidered.  Past Medical History  Diagnosis Date  . COPD (chronic obstructive pulmonary disease) ([place])   . Carotid stenosis     a. 07/2013 Carotid U/S: 100% RICA (pt prev reported h/o 99991111), LICA [id number], bilat ECA 50%, normal vertebrals and subclavians bilat-->F/U needed in 01/2014.  [name]  Essential hypertension   . Paroxysmal atrial flutter ([place])     a. CHA2DS2VASc = 3 -> eliquis;  b. 07/2013 Echo: EF 50-55%, mildly dil LA.c. s/p RFCA 10/31/2013 by Dr Caryl Comes  . Syncope and collapse   . Coronary artery disease     a. 06/1996 & 03/2005 Cath: non-obs dzs;  b. 07/2009 NSTEMI/Cath: LCX 99->PCI/DES extending into OM1;  c. 01/2013 Cath: patent LCX stent-->Med Rx.; d. cath 03/2014: RPAV dzs->Med rx; e. 10/2014 Cath/PCI: LM 30, LAD 20p/m, LCX 50ost/p, 10 ISR, 60d, OM1 min irregs, OM2 50, RCA 30ost, 77m, 60d, RPDA 70(2.5x8 Xience DES), RPAV 90small(PTCA); f. 10/2014 Relook Cath: RPAV 99(Med Rx), EF 45-50.  [name] Hyperlipidemia   . Cataract, right     a. 10/2010 s/p cataract surgery   . Shingles     a. 09/2012.  . Pulmonary nodule, right     a. 0.7cm RLL - stable by CT 06/2013.  . Bilateral claudication of lower limb ([place])   . SSS (sick sinus syndrome) ([place])     a. 12/2009 S/P MDT Adapta ADDR01 DC PPM, ser # [id number] ([name]).  . Hyperkalemia   . Asthma   . Seizures ([place])     a. last 30 years ago  . Rectal cancer ([place])     a. Status post colostomy  . Prostate cancer [facility])     Past Surgical History  Procedure Laterality Date  . Cardiac surgery  2011    Stents placed   . Cataract surgery  2012  .  Colon surgery      cancer removal  . Knee arthroscopy Right 1980's  . Insert / replace / remove pacemaker    . Ablation  10/31/2013    CTI ablation by Dr Caryl Comes for atrial flutter  . Atrial flutter ablation N/A 10/31/2013    Procedure: ATRIAL FLUTTER ABLATION;  Surgeon: [name], MD;  Location: [facility] CATH LAB;  Service: Cardiovascular;  Laterality: N/A;  . Cardiac [facility]  07/2009    [facility]  . Cardiac catheter[facility]  01/2013    [facility]  . Cardiac catheterization  03/2014    [facility]  . Coronary angioplasty    . Colon surgery      colostomy  . Cataract extraction w/phaco Left 06/01/2014    Procedure: CATARACT EXTRACTION PHACO AND INTRAOCULAR LENS PLACEMENT (IOC);  Surgeon: [name], MD;  Location: [name] ORS;  Service: Ophthalmology;  Laterality: Left;  . Cataract extraction Left   . Cardiac catheterization N/A 11/02/2014    Procedure: Left Heart Cath and Cors/Grafts Angiography;  Surgeon: [name], MD;  Location: [facility] CV LAB;  Service: Cardiovascular;  Laterality: N/A;  . Cardiac catheterization N/A 11/02/2014    Procedure: Coronary Stent Intervention;  Surgeon: [name], MD;  Location: [place] CV LAB;  Service: Cardiovascular;  Laterality: N/A;  . Cardiac catheterization N/A 11/03/2014    Procedure: Left Heart Cath and Coronary Angiography;  Surgeon: [name], MD;  Location: [place] CV LAB;  Service: Cardiovascular;  Laterality: N/A;    Current Outpatient Prescriptions  Medication Sig Dispense Refill  . albuterol (PROAIR HFA) 108 (90 Base) MCG/ACT inhaler Inhale 2 puffs into the lungs every 6 (six) hours as needed for wheezing or shortness of breath.    [name] albuterol (PROVENTIL) (2.5 MG/3ML) 0.083% nebulizer solution Take 3 mLs (2.5 mg total) by nebulization every 6 (six) hours as needed for wheezing or shortness of breath. DX code 493.20 120 mL 3  . arformoterol (BROVANA) 15 MCG/2ML NEBU Take 2 mLs (15 mcg total) by nebulization 2 (two) times daily. DX code 493.20    . aspirin 81 MG tablet Take 81 mg by mouth daily.    [name] atorvastatin (LIPITOR) 40 MG tablet Take 40 mg by mouth daily.    . budesonide (PULMICORT) 0.25 MG/2ML nebulizer solution Take 4 mLs (0.5 mg total) by nebulization 2 (two) times daily. 240 mL 12  . budesonide (PULMICORT) 0.5 MG/2ML nebulizer solution Take 2 mLs (0.5 mg total) by nebulization 2 (two) times daily. 240 mL 2  . Cholecalciferol (VITAMIN D) 2000 UNITS tablet Take 2,000 Units by mouth daily.    . clopidogrel (PLAVIX) 75 MG tablet Take 1 tablet (75 mg total) by mouth daily with breakfast. 30 tablet 12  . degarelix (FIRMAGON) 120 MG injection Inject into the skin.    [name] isosorbide mononitrate  (IMDUR) 60 MG 24 hr tablet TAKE 1/2 TABLET BY MOUTH 3 TIMES A DAY 45 tablet 3  . losartan (COZAAR) 50 MG tablet Take 0.5 tablets (25 mg total) by mouth daily. 30 tablet 12  . metoprolol tartrate (LOPRESSOR) 25 MG tablet TAKE ONE TABLET TWICE DAILY 60 tablet 3  . nitroGLYCERIN (NITROSTAT) 0.3 MG SL tablet Place 1 tablet (0.3 mg total) under the tongue every 5 (five) minutes as needed for chest pain. 90 tablet 12  . nitroGLYCERIN (NITROSTAT) 0.4 MG SL tablet Place 1 tablet (0.4 mg total) under the tongue every 5 (five) minutes as needed for chest pain. 20 tablet 12  .  nystatin cream (MYCOSTATIN) Use as directed by doctor    . tiotropium (SPIRIVA) 18 MCG inhalation capsule Place 1 capsule (18 mcg total) into inhaler and inhale daily. 30 capsule 11  . traMADol (ULTRAM) 50 MG tablet Take 50 mg by mouth daily as needed.    [name] Umeclidinium Bromide (INCRUSE ELLIPTA) 62.5 MCG/INH AEPB Inhale 1 puff into the lungs daily. 30 each 5   No current facility-administered medications for this visit.    No Known Allergies  Review of Systems negative except from HPI and PMH  Physical Exam BP 124/72 mmHg  Pulse 73  Ht 5\' 9"  (1.753 m)  Wt 201 lb 8 oz (91.4 kg)  BMI 29.74 kg/m2 Well developed and well nourished in no acute distress HENT normal E scleral and icterus clear Neck Supple JVP flat; carotids brisk and full Clear to ausculation Fast and irregular  rate and rhythm, no murmurs gallops or rub Soft with active bowel sounds No clubbing cyanosis no Edema Alert and oriented, grossly normal motor and sensory function Skin Warm and Dry  ECG demonstrates atrially paced rhythm with frequent PVCs  Assessment and  Plan  Atrial flutter-s/p ablation  Sinus node dysfunction  PVCs   Hypertension with orthostatic hypotension  Cehst pain  Coronary disease with prior stenting  And repeat stenting  HFpEF  COPD  prostsate Cancer  Anger/anxiety issue    Orthostasis is much improved  Blood  pressure is reasonable  Having recurrent atypical symptoms with neck discomfort which has been a manifestation of ischemia in the past. I am not sure nitroglycerin responsive to time response. When he had this in the hospital, ECG didn't change; however, he did have a non-STEMI raising the concern. We will discontinue his losartan as his blood pressure is well-controlled. We will increase his isosorbide mononitrate 220 mg and having taken all at one time in the morning. Increase his metoprolol from 25--50 mg twice daily.  I have suggested taht he followup with his PCP regarding antidepressant therapy  There is a total pressure with his prostate cancer and  his wife's health needs  ,More than 50% of 45 min was spent in counseling related to the above

## 2015-03-18 ENCOUNTER — Encounter: Payer: Self-pay | Admitting: Internal Medicine

## 2015-03-18 ENCOUNTER — Ambulatory Visit (INDEPENDENT_AMBULATORY_CARE_PROVIDER_SITE_OTHER): Payer: Medicare Other | Admitting: Internal Medicine

## 2015-03-18 VITALS — BP 122/80 | HR 83 | Ht 69.5 in | Wt 204.8 lb

## 2015-03-18 DIAGNOSIS — J449 Chronic obstructive pulmonary disease, unspecified: Secondary | ICD-10-CM

## 2015-03-18 DIAGNOSIS — J309 Allergic rhinitis, unspecified: Secondary | ICD-10-CM | POA: Diagnosis not present

## 2015-03-18 MED ORDER — AZITHROMYCIN 250 MG PO TABS: ORAL_TABLET | ORAL | Status: DC

## 2015-03-18 MED ORDER — CETIRIZINE HCL 10 MG PO TBDP: 10.0000 mg | ORAL_TABLET | Freq: Every day | ORAL | Status: DC

## 2015-03-18 MED ORDER — UMECLIDINIUM BROMIDE 62.5 MCG/INH IN AEPB: 1.0000 | INHALATION_SPRAY | Freq: Every day | RESPIRATORY_TRACT | Status: AC

## 2015-03-18 NOTE — Progress Notes (Signed)
   Subjective:    Patient ID: Donald Juarez, male    DOB: 08-May-1945, 70 y.o.   MRN: [id number]  Synopsis: Mr. Messenger first saw the [facility] clinic in 2014 for COPD after smoking 1 PPD for 30 years and quitting in 2010.  He has frequent exacerbations.    03/2013 Full PFT> Ratio 53% FEV1 1.89 (65% pred, 13% change), TLC 5.83 L (93% pred), DLCO 16.6 (69% pred) Quit tobacco 2010 Recent Cardiac Stent 11/16  CC: chronic SOB, follow up COPD  HPI  Has chronic SOB, DOE. With  Intermittent wheezing  No signs of infection at this time Has difficulty with incontinence, prostate obstruction from prostate cancer   Inhaler regimen as follows 7AM takes albuterol neb 8 AM takes Brovana/Pulmicort nebs 11 AM takes Spiriva Albuterol Neb throughout the day as needed Refuses to obtain sleep study   Review of Systems  Constitutional: Negative for fever, chills and fatigue.  HENT: Negative for congestion.   Respiratory: Positive for cough, shortness of breath and wheezing. Negative for chest tightness.   Cardiovascular: Negative for chest pain, palpitations and leg swelling.  All other systems reviewed and are negative.      Objective:   Physical Exam  Constitutional: He is oriented to person, place, and time. He appears well-developed and well-nourished. No distress.  HENT:  Head: Normocephalic and atraumatic.  Mouth/Throat: No oropharyngeal exudate.  Eyes: EOM are normal.  Cardiovascular: Normal rate, regular rhythm and normal heart sounds.   No murmur heard. Pulmonary/Chest: No stridor. No respiratory distress. He has no wheezes.  Musculoskeletal: Normal range of motion. He exhibits no edema.  Neurological: He is alert and oriented to person, place, and time.  Skin: Skin is warm. He is not diaphoretic.  Psychiatric: He has a normal mood and affect.   Filed Vitals:   03/18/15 1358  BP: 122/80  Pulse: 83  Height: 5' 9.5" (1.765 m)  Weight: 204 lb 12.8 oz (92.897 kg)   SpO2: 96%      03/12/2014 left heart catheterization reviewed> moderately elevated LVEDP, patent stents, occlusion noted in a bifurcation in the RCA territory, LVEF 65% 05/10/2014 chest x-ray images personally reviewed there are no pulmonary infiltrates but there is emphysema bilaterally pacemaker in place    Assessment & Plan:  70 yo white male with end stage COPD with chronic SOB and recurrent exacerbations COPD, severe GOLD GRADE D  1.pulmicort neb to 0.5mg  to start in 1 month 2.continue Incruse 3.start zithromax 250 mg daily for COPD exac prevention 4.continue pulmciort/LABA(brovana) as prescribed 5.continue oxygen at night  Follow up 3 month to assess interval changes  The Patient requires high complexity decision making for assessment and support, frequent evaluation and titration of therapies. Patient  satisfied with Plan of action and management. All questions answered  Corrin Parker, M.D.  [facility]  Medical Director [place] Director [facility] Cardio-Pulmonary Department

## 2015-03-18 NOTE — Patient Instructions (Signed)
Chronic Obstructive Pulmonary Disease Chronic obstructive pulmonary disease (COPD) is a common lung condition in which airflow from the lungs is limited. COPD is a general term that can be used to describe many different lung problems that limit airflow, including both chronic bronchitis and emphysema. If you have COPD, your lung function will probably never return to normal, but there are measures you can take to improve lung function and make yourself feel better. CAUSES   Smoking (common).  Exposure to secondhand smoke.  Genetic problems.  Chronic inflammatory lung diseases or recurrent infections. SYMPTOMS  Shortness of breath, especially with physical activity.  Deep, persistent (chronic) cough with a large amount of thick mucus.  Wheezing.  Rapid breaths (tachypnea).  Gray or bluish discoloration (cyanosis) of the skin, especially in your fingers, toes, or lips.  Fatigue.  Weight loss.  Frequent infections or episodes when breathing symptoms become much worse (exacerbations).  Chest tightness. DIAGNOSIS Your health care provider will take a medical history and perform a physical examination to diagnose COPD. Additional tests for COPD may include:  Lung (pulmonary) function tests.  Chest X-ray.  CT scan.  Blood tests. TREATMENT  Treatment for COPD may include:  Inhaler and nebulizer medicines. These help manage the symptoms of COPD and make your breathing more comfortable.  Supplemental oxygen. Supplemental oxygen is only helpful if you have a low oxygen level in your blood.  Exercise and physical activity. These are beneficial for nearly all people with COPD.  Lung surgery or transplant.  Nutrition therapy to gain weight, if you are underweight.  Pulmonary rehabilitation. This may involve working with a team of health care providers and specialists, such as respiratory, occupational, and physical therapists. HOME CARE INSTRUCTIONS  Take all medicines  (inhaled or pills) as directed by your health care provider.  Avoid over-the-counter medicines or cough syrups that dry up your airway (such as antihistamines) and slow down the elimination of secretions unless instructed otherwise by your health care provider.  If you are a smoker, the most important thing that you can do is stop smoking. Continuing to smoke will cause further lung damage and breathing trouble. Ask your health care provider for help with quitting smoking. He or she can direct you to community resources or hospitals that provide support.  Avoid exposure to irritants such as smoke, chemicals, and fumes that aggravate your breathing.  Use oxygen therapy and pulmonary rehabilitation if directed by your health care provider. If you require home oxygen therapy, ask your health care provider whether you should purchase a pulse oximeter to measure your oxygen level at home.  Avoid contact with individuals who have a contagious illness.  Avoid extreme temperature and humidity changes.  Eat healthy foods. Eating smaller, more frequent meals and resting before meals may help you maintain your strength.  Stay active, but balance activity with periods of rest. Exercise and physical activity will help you maintain your ability to do things you want to do.  Preventing infection and hospitalization is very important when you have COPD. Make sure to receive all the vaccines your health care provider recommends, especially the pneumococcal and influenza vaccines. Ask your health care provider whether you need a pneumonia vaccine.  Learn and use relaxation techniques to manage stress.  Learn and use controlled breathing techniques as directed by your health care provider. Controlled breathing techniques include:  Pursed lip breathing. Start by breathing in (inhaling) through your nose for 1 second. Then, purse your lips as if you were   going to whistle and breathe out (exhale) through the  pursed lips for 2 seconds.  Diaphragmatic breathing. Start by putting one hand on your abdomen just above your waist. Inhale slowly through your nose. The hand on your abdomen should move out. Then purse your lips and exhale slowly. You should be able to feel the hand on your abdomen moving in as you exhale.  Learn and use controlled coughing to clear mucus from your lungs. Controlled coughing is a series of short, progressive coughs. The steps of controlled coughing are: 1. Lean your head slightly forward. 2. Breathe in deeply using diaphragmatic breathing. 3. Try to hold your breath for 3 seconds. 4. Keep your mouth slightly open while coughing twice. 5. Spit any mucus out into a tissue. 6. Rest and repeat the steps once or twice as needed. SEEK MEDICAL CARE IF:  You are coughing up more mucus than usual.  There is a change in the color or thickness of your mucus.  Your breathing is more labored than usual.  Your breathing is faster than usual. SEEK IMMEDIATE MEDICAL CARE IF:  You have shortness of breath while you are resting.  You have shortness of breath that prevents you from:  Being able to talk.  Performing your usual physical activities.  You have chest pain lasting longer than 5 minutes.  Your skin color is more cyanotic than usual.  You measure low oxygen saturations for longer than 5 minutes with a pulse oximeter. MAKE SURE YOU:  Understand these instructions.  Will watch your condition.  Will get help right away if you are not doing well or get worse.   This information is not intended to replace advice given to you by your health care provider. Make sure you discuss any questions you have with your health care provider.   Document Released: 10/26/2004 Document Revised: 02/06/2014 Document Reviewed: 09/12/2012 Elsevier Interactive Patient Education 2016 Elsevier Inc.   PULMICORT 0.5 mg BID STAT ZYRTEC one tablet at night START ZITHROMAX 250 MG  DAILY CONTINUE INCRUSE

## 2015-03-29 ENCOUNTER — Telehealth: Payer: Self-pay

## 2015-03-29 ENCOUNTER — Telehealth: Payer: Self-pay | Admitting: *Deleted

## 2015-03-29 NOTE — Telephone Encounter (Signed)
DS looked at pt's ONO while you were unavailable and states pt doesn't need O2 at night. Please advise if I can place an order to d/c.

## 2015-03-29 NOTE — Telephone Encounter (Signed)
Informed pt of ONO results per DS. Pt doesn't require O2 at night. Nothing further needed.

## 2015-03-29 NOTE — Telephone Encounter (Signed)
Spoke with pt and he also wants you to look at the ONO that I have. Will hold until you come in office. Thanks

## 2015-03-29 NOTE — Telephone Encounter (Signed)
Pt called, states that he needs a letter stating he doesn't need his oxygen anymore. States his medication is "mixed up" please call

## 2015-04-01 NOTE — Telephone Encounter (Signed)
Spoke with pt informing him he needs to O2 at 2L. Called [facility] per pt request to let them know that he needs to keep the O2. He states he called them to come and get it. Spoke with Rodena Piety at [facility] and she states there are not any notes where he has spoken to anyone in office in regards to this. She states she will make a note that pt needs the O2.   Pt ask to see KK next week but I informed him KK was not available but I would be more than happy to bring him in Monday to see DS due to pt stating his breathing is getting worse. I told pt I could bring him in on 04-16-15 to see KK. He also denied that appt. Informed pt to call me back if he changed his mind. Nothing further needed.

## 2015-04-01 NOTE — Telephone Encounter (Signed)
Spoke with KK in regards to ONO. Will call pt.

## 2015-04-28 ENCOUNTER — Encounter: Payer: Self-pay | Admitting: Internal Medicine

## 2015-05-13 ENCOUNTER — Telehealth: Payer: Self-pay | Admitting: *Deleted

## 2015-05-13 DIAGNOSIS — J449 Chronic obstructive pulmonary disease, unspecified: Secondary | ICD-10-CM

## 2015-05-13 NOTE — Telephone Encounter (Signed)
Spoke with pt in regards to O2. Gave pt option per KK to repeat ONO or d/c O2. Pt request to d/c O2 since he doesn't wear it. Order placed to have O2 picked up.

## 2015-06-08 ENCOUNTER — Ambulatory Visit (INDEPENDENT_AMBULATORY_CARE_PROVIDER_SITE_OTHER): Payer: Medicare Other | Admitting: Internal Medicine

## 2015-06-08 ENCOUNTER — Telehealth: Payer: Self-pay | Admitting: Internal Medicine

## 2015-06-08 ENCOUNTER — Encounter: Payer: Self-pay | Admitting: Internal Medicine

## 2015-06-08 VITALS — BP 122/78 | HR 89 | Ht 70.0 in | Wt 201.0 lb

## 2015-06-08 DIAGNOSIS — J441 Chronic obstructive pulmonary disease with (acute) exacerbation: Secondary | ICD-10-CM

## 2015-06-08 MED ORDER — UMECLIDINIUM BROMIDE 62.5 MCG/INH IN AEPB
1.0000 | INHALATION_SPRAY | Freq: Every day | RESPIRATORY_TRACT | Status: AC
Start: 2015-06-08 — End: 2015-06-09

## 2015-06-08 MED ORDER — ALBUTEROL SULFATE HFA 108 (90 BASE) MCG/ACT IN AERS: 2.0000 | INHALATION_SPRAY | Freq: Four times a day (QID) | RESPIRATORY_TRACT | Status: DC | PRN

## 2015-06-08 NOTE — Addendum Note (Signed)
Addended by: Maryanna Shape A on: 06/08/2015 10:47 AM   Modules accepted: Orders

## 2015-06-08 NOTE — Progress Notes (Signed)
   Subjective:    Patient ID: Donald Juarez, male    DOB: 12/21/1945, 70 y.o.   MRN: [id number]  Synopsis: Mr. Pocasangre first saw the [facility] in 2014 for COPD after smoking 1 PPD for 30 years and quitting in 2010.  He has frequent exacerbations.    03/2013 Full PFT> Ratio 53% FEV1 1.89 (65% pred, 13% change), TLC 5.83 L (93% pred), DLCO 16.6 (69% pred) Quit tobacco 2010 Recent Cardiac Stent 11/16  CC: chronic SOB, follow up COPD  HPI  Has chronic SOB, DOE. With  Intermittent wheezing-feeling much better  No signs of infection at this time Has difficulty with incontinence, prostate obstruction from prostate cancer Patient was started on azithromycin to preventt COPD exacerbations and he feels that this has helped a lot-has not had to be on steroids since starting this   Inhaler regimen as follows 7AM-8AM albuterol neb/Brovana/takes incruse Albuterol Neb throughout the day as needed Refuses to obtain sleep study   Review of Systems  Constitutional: Negative for fever, chills and fatigue.  HENT: Negative for congestion.   Respiratory: Positive for cough and wheezing. Negative for chest tightness and shortness of breath.   Cardiovascular: Negative for chest pain, palpitations and leg swelling.  All other systems reviewed and are negative.      Objective:   Physical Exam  Constitutional: He is oriented to person, place, and time. He appears well-developed and well-nourished. No distress.  HENT:  Head: Normocephalic and atraumatic.  Mouth/Throat: No oropharyngeal exudate.  Eyes: EOM are normal.  Cardiovascular: Normal rate, regular rhythm and normal heart sounds.   No murmur heard. Pulmonary/Chest: No stridor. No respiratory distress. He has no wheezes.  Musculoskeletal: Normal range of motion. He exhibits no edema.  Neurological: He is alert and oriented to person, place, and time.  Skin: Skin is warm. He is not diaphoretic.  Psychiatric: He has a  normal mood and affect.   Filed Vitals:   06/08/15 1002  BP: 122/78  Pulse: 89  Height: 5\' 10"  (1.778 m)  Weight: 201 lb (91.173 kg)  SpO2: 93%      03/12/2014 left heart catheterization reviewed> moderately elevated LVEDP, patent stents, occlusion noted in a bifurcation in the RCA territory, LVEF 65% 05/10/2014 chest x-ray images personally reviewed there are no pulmonary infiltrates but there is emphysema bilaterally pacemaker in place    Assessment & Plan:  70 yo white male with end stage COPD with chronic SOB COPD, severe GOLD GRADE D  1.continue Incruse 2.albuterol NEB as needed and QHS 3.continue zithromax 250 mg daily for COPD exac prevention 4.continue pulmciort/LABA(brovana) as prescribed  Follow up 4 month to assess interval changes  The Patient requires high complexity decision making for assessment and support, frequent evaluation and titration of therapies. Patient  satisfied with Plan of action and management. All questions answered  Corrin Parker, M.D.  [facility]  Medical Director [place] Director [facility] Cardio-Pulmonary Department

## 2015-06-08 NOTE — Patient Instructions (Signed)
Chronic Obstructive Pulmonary Disease Chronic obstructive pulmonary disease (COPD) is a common lung condition in which airflow from the lungs is limited. COPD is a general term that can be used to describe many different lung problems that limit airflow, including both chronic bronchitis and emphysema. If you have COPD, your lung function will probably never return to normal, but there are measures you can take to improve lung function and make yourself feel better. CAUSES   Smoking (common).  Exposure to secondhand smoke.  Genetic problems.  Chronic inflammatory lung diseases or recurrent infections. SYMPTOMS  Shortness of breath, especially with physical activity.  Deep, persistent (chronic) cough with a large amount of thick mucus.  Wheezing.  Rapid breaths (tachypnea).  Gray or bluish discoloration (cyanosis) of the skin, especially in your fingers, toes, or lips.  Fatigue.  Weight loss.  Frequent infections or episodes when breathing symptoms become much worse (exacerbations).  Chest tightness. DIAGNOSIS Your health care provider will take a medical history and perform a physical examination to diagnose COPD. Additional tests for COPD may include:  Lung (pulmonary) function tests.  Chest X-ray.  CT scan.  Blood tests. TREATMENT  Treatment for COPD may include:  Inhaler and nebulizer medicines. These help manage the symptoms of COPD and make your breathing more comfortable.  Supplemental oxygen. Supplemental oxygen is only helpful if you have a low oxygen level in your blood.  Exercise and physical activity. These are beneficial for nearly all people with COPD.  Lung surgery or transplant.  Nutrition therapy to gain weight, if you are underweight.  Pulmonary rehabilitation. This may involve working with a team of health care providers and specialists, such as respiratory, occupational, and physical therapists. HOME CARE INSTRUCTIONS  Take all medicines  (inhaled or pills) as directed by your health care provider.  Avoid over-the-counter medicines or cough syrups that dry up your airway (such as antihistamines) and slow down the elimination of secretions unless instructed otherwise by your health care provider.  If you are a smoker, the most important thing that you can do is stop smoking. Continuing to smoke will cause further lung damage and breathing trouble. Ask your health care provider for help with quitting smoking. He or she can direct you to community resources or hospitals that provide support.  Avoid exposure to irritants such as smoke, chemicals, and fumes that aggravate your breathing.  Use oxygen therapy and pulmonary rehabilitation if directed by your health care provider. If you require home oxygen therapy, ask your health care provider whether you should purchase a pulse oximeter to measure your oxygen level at home.  Avoid contact with individuals who have a contagious illness.  Avoid extreme temperature and humidity changes.  Eat healthy foods. Eating smaller, more frequent meals and resting before meals may help you maintain your strength.  Stay active, but balance activity with periods of rest. Exercise and physical activity will help you maintain your ability to do things you want to do.  Preventing infection and hospitalization is very important when you have COPD. Make sure to receive all the vaccines your health care provider recommends, especially the pneumococcal and influenza vaccines. Ask your health care provider whether you need a pneumonia vaccine.  Learn and use relaxation techniques to manage stress.  Learn and use controlled breathing techniques as directed by your health care provider. Controlled breathing techniques include:  Pursed lip breathing. Start by breathing in (inhaling) through your nose for 1 second. Then, purse your lips as if you were   going to whistle and breathe out (exhale) through the  pursed lips for 2 seconds.  Diaphragmatic breathing. Start by putting one hand on your abdomen just above your waist. Inhale slowly through your nose. The hand on your abdomen should move out. Then purse your lips and exhale slowly. You should be able to feel the hand on your abdomen moving in as you exhale.  Learn and use controlled coughing to clear mucus from your lungs. Controlled coughing is a series of short, progressive coughs. The steps of controlled coughing are: 1. Lean your head slightly forward. 2. Breathe in deeply using diaphragmatic breathing. 3. Try to hold your breath for 3 seconds. 4. Keep your mouth slightly open while coughing twice. 5. Spit any mucus out into a tissue. 6. Rest and repeat the steps once or twice as needed. SEEK MEDICAL CARE IF:  You are coughing up more mucus than usual.  There is a change in the color or thickness of your mucus.  Your breathing is more labored than usual.  Your breathing is faster than usual. SEEK IMMEDIATE MEDICAL CARE IF:  You have shortness of breath while you are resting.  You have shortness of breath that prevents you from:  Being able to talk.  Performing your usual physical activities.  You have chest pain lasting longer than 5 minutes.  Your skin color is more cyanotic than usual.  You measure low oxygen saturations for longer than 5 minutes with a pulse oximeter. MAKE SURE YOU:  Understand these instructions.  Will watch your condition.  Will get help right away if you are not doing well or get worse.   This information is not intended to replace advice given to you by your health care provider. Make sure you discuss any questions you have with your health care provider.   Document Released: 10/26/2004 Document Revised: 02/06/2014 Document Reviewed: 09/12/2012 Elsevier Interactive Patient Education 2016 Elsevier Inc.  

## 2015-06-08 NOTE — Telephone Encounter (Signed)
Patient would like results of the Carotid u/s done in 12/16. Thanks!

## 2015-06-15 ENCOUNTER — Telehealth: Payer: Self-pay | Admitting: *Deleted

## 2015-06-15 ENCOUNTER — Telehealth: Payer: Self-pay | Admitting: Cardiology

## 2015-06-15 ENCOUNTER — Ambulatory Visit (INDEPENDENT_AMBULATORY_CARE_PROVIDER_SITE_OTHER): Payer: Medicare Other | Admitting: *Deleted

## 2015-06-15 DIAGNOSIS — I495 Sick sinus syndrome: Secondary | ICD-10-CM

## 2015-06-15 NOTE — Progress Notes (Signed)
Remote pacemaker transmission.   

## 2015-06-15 NOTE — Telephone Encounter (Signed)
Called patient back- we did receive transmission. He is appreciative of call.

## 2015-06-15 NOTE — Telephone Encounter (Signed)
Follow Up ° °4. Are you calling to see if we received your device transmission? Yes  ° ° ° °

## 2015-06-15 NOTE — Telephone Encounter (Signed)
Spoke with pt and reminded pt of remote transmission that is due today. Pt verbalized understanding.   

## 2015-06-15 NOTE — Telephone Encounter (Signed)
Patient says he does not have the right machine for remote  .  Patient does not wants to wait all day  For someone to call him back.  Asked to be transferred to the [facility] office.

## 2015-07-19 ENCOUNTER — Telehealth: Payer: Self-pay | Admitting: Internal Medicine

## 2015-07-19 NOTE — Telephone Encounter (Signed)
Patient states that he took a shower and then went downstairs to do his exercises, at that time he started sweating and his neck was cool to touch. He went from a sitting position to a standing position and felt dizzy. Blood pressure he reported was 60/40 at that time. He ate some beets to help bring it back up and it took roughly an hour before it reached the 100's. Currently his blood pressure reading was 125/78 with HR of 92. Let him know that he needs to transition from laying, sitting, and standing very slowly because of orthostatic changes can cause these symptoms. He verbalized understanding and stated that he has been doing that since being treated for it by Dr. Caryl Comes. Let him know that I would check to see if we could get an appointment and would call him back.

## 2015-07-19 NOTE — Telephone Encounter (Signed)
Spoke with patient at length. Let him know that hot showers can also cause these similar symptoms and cause blood pressure changes. Instructed him to stay hydrated and try to wear compression hose which can help with blood flow. Again expressed for him to transition from different positions slowly to hopefully help decrease these symptoms. Patient wants to be seen as soon as possible and we scheduled him an appointment with Dr. Caryl Comes on 08/10/15 at 09:15AM and also placed him on the waiting list if something should become open before then. Also instructed patient that if his symptoms persist or if they should get worse to please go to the emergency room for evaluation. Let him know that I would forward this information to Dr. Caryl Comes and his nurse for their review. Patient verbalized understanding of all instructions and had no further questions at this time.

## 2015-07-19 NOTE — Telephone Encounter (Signed)
Pt states yesterday his BP was 60/40, states he broke out into "bad sweats". This happens when he stands up. States he had neck pain yesterday, and some today.  BP this morning 125/78, HR was 92

## 2015-07-20 LAB — CUP PACEART REMOTE DEVICE CHECK
Battery Remaining Longevity: 15 mo
Brady Statistic AP VS Percent: 92 %
Brady Statistic AS VP Percent: 0 %
Brady Statistic AS VS Percent: 7 %
Date Time Interrogation Session: 20170516173707
Implantable Lead Implant Date: 20111219
Implantable Lead Location: 753859
Implantable Lead Model: 5594
Lead Channel Impedance Value: 584 Ohm
Lead Channel Pacing Threshold Pulse Width: 0.4 ms
Lead Channel Sensing Intrinsic Amplitude: 5.6 mV
Lead Channel Setting Sensing Sensitivity: 2 mV
MDC IDC LEAD IMPLANT DT: 20111219
MDC IDC LEAD LOCATION: 753860
MDC IDC MSMT BATTERY IMPEDANCE: 1066 Ohm
MDC IDC MSMT BATTERY VOLTAGE: 2.73 V
MDC IDC MSMT LEADCHNL RA IMPEDANCE VALUE: 477 Ohm
MDC IDC MSMT LEADCHNL RV PACING THRESHOLD AMPLITUDE: 0.625 V
MDC IDC SET LEADCHNL RA PACING AMPLITUDE: 4.5 V
MDC IDC SET LEADCHNL RV PACING AMPLITUDE: 2 V
MDC IDC SET LEADCHNL RV PACING PULSEWIDTH: 0.4 ms
MDC IDC STAT BRADY AP VP PERCENT: 0 %

## 2015-07-23 ENCOUNTER — Other Ambulatory Visit: Payer: Self-pay | Admitting: Internal Medicine

## 2015-08-10 ENCOUNTER — Encounter: Payer: Self-pay | Admitting: Internal Medicine

## 2015-08-10 ENCOUNTER — Ambulatory Visit (INDEPENDENT_AMBULATORY_CARE_PROVIDER_SITE_OTHER): Payer: Medicare Other | Admitting: Internal Medicine

## 2015-08-10 VITALS — BP 137/79 | HR 71 | Ht 70.0 in | Wt 199.2 lb

## 2015-08-10 DIAGNOSIS — I4892 Unspecified atrial flutter: Secondary | ICD-10-CM

## 2015-08-10 DIAGNOSIS — I5030 Unspecified diastolic (congestive) heart failure: Secondary | ICD-10-CM

## 2015-08-10 DIAGNOSIS — I493 Ventricular premature depolarization: Secondary | ICD-10-CM

## 2015-08-10 DIAGNOSIS — Z950 Presence of cardiac pacemaker: Secondary | ICD-10-CM | POA: Diagnosis not present

## 2015-08-10 DIAGNOSIS — I495 Sick sinus syndrome: Secondary | ICD-10-CM | POA: Diagnosis not present

## 2015-08-10 DIAGNOSIS — I951 Orthostatic hypotension: Secondary | ICD-10-CM

## 2015-08-10 MED ORDER — ISOSORBIDE MONONITRATE ER 120 MG PO TB24: ORAL_TABLET | ORAL | Status: DC

## 2015-08-10 MED ORDER — RANOLAZINE ER 500 MG PO TB12: 500.0000 mg | ORAL_TABLET | Freq: Two times a day (BID) | ORAL | Status: DC

## 2015-08-10 MED ORDER — METOPROLOL TARTRATE 25 MG PO TABS: ORAL_TABLET | ORAL | Status: DC

## 2015-08-10 NOTE — Patient Instructions (Addendum)
Medication Instructions: - Your physician has recommended you make the following change in your medication:  1) Take imdur (isorbide MN) 120 mg one tablet by mouth at bedtime 2) Decrease lopressor (metoprolol tartrate) to one tablet (25 mg) in the morning and two tablets (50 mg) in the evening 3) Start Ranexa 500 mg one tablet by mouth twice daily  Labwork: - none  Procedures/Testing: - none  Follow-Up: - Remote monitoring is used to monitor your Pacemaker of ICD from home. This monitoring reduces the number of office visits required to check your device to one time per year. It allows [place] to keep an eye on the functioning of your device to ensure it is working properly. You are scheduled for a device check from home on 11/09/15. You may send your transmission at any time that day. If you have a wireless device, the transmission will be sent automatically. After your physician reviews your transmission, you will receive a postcard with your next transmission date.  - Your physician wants you to follow-up in: 6 months with Dr. Caryl Comes. You will receive a reminder letter in the mail two months in advance. If you don't receive a letter, please call our office to schedule the follow-up appointment.  Any Additional Special Instructions Will Be Listed Below (If Applicable).     If you need a refill on your cardiac medications before your next appointment, please call your pharmacy.

## 2015-08-10 NOTE — Progress Notes (Signed)
Patient Care Team: Sofie Hartigan, MD as PCP - General (Family Medicine)   HPI  Donald Juarez is a 70 y.o. male Seen in followup for pacemaker implanted 2011 for sinus node dysfunction.  He also has a history of atrial flutter for which he underwent ablation 10/16   He also has problems with systolic hypertension and orthostatic intolerance; Amlodipine was stopped and imdur decreased.  2/17 imdur increased 2/2 angina/neck pain    Cath 2/16  Patent  left circumflex stents. There was 80% stenosis in the ostial right AV groove artery at the bifurcation of the PDA. This was not an optimal location for PCI and supplied a relatively small area Felt that the risk of PCI outweighs the benefit. Left ventricular end-diastolic pressure was 22. Myoview 4/16>> no ischemia EF 55%   I have reviewed the son of October 2016. Because of ongoing discomfort he underwent catheterization  With complex bifurcation lesion in the RCA distribution. He had protracted post intervention chest pain peak troponin was greater than 10;  catheterization demonstrated subtotal occlusion of the POBA site and it was felt that repeat intervention  greater risk than benefit    We had also discussed the role of depression in his life;  He reviewed this with PCP and started on SSRI      His prostate problem has been identified as prostate cancer; he has had recurrent urinary tract infections. Radiation therapy is being reconsidered.  Past Medical History  Diagnosis Date  . COPD (chronic obstructive pulmonary disease) ([place])   . Carotid stenosis     a. 07/2013 Carotid U/S: 100% RICA (pt prev reported h/o [id number]), LICA [id number], bilat ECA 50%, normal vertebrals and subclavians bilat-->F/U needed in 01/2014.  [name] Essential hypertension   . Paroxysmal atrial flutter ([place])     a. CHA2DS2VASc = 3 -> eliquis;  b. 07/2013 Echo: EF 50-55%, mildly dil LA.c. s/p RFCA 10/31/2013 by Dr Caryl Comes  . Syncope and collapse   . Coronary artery  disease     a. 06/1996 & 03/2005 Cath: non-obs dzs;  b. 07/2009 NSTEMI/Cath: LCX 99->PCI/DES extending into OM1;  c. 01/2013 Cath: patent LCX stent-->Med Rx.; d. cath 03/2014: RPAV dzs->Med rx; e. 10/2014 Cath/PCI: LM 30, LAD 20p/m, LCX 50ost/p, 10 ISR, 60d, OM1 min irregs, OM2 50, RCA 30ost, 43m, 60d, RPDA 70(2.5x8 Xience DES), RPAV 90small(PTCA); f. 10/2014 Relook Cath: RPAV 99(Med Rx), EF 45-50.  [name] Hyperlipidemia   . Cataract, right     a. 10/2010 s/p cataract surgery   . Shingles     a. 09/2012.  . Pulmonary nodule, right     a. 0.7cm RLL - stable by CT 06/2013.  . Bilateral claudication of lower limb ([place])   . SSS (sick sinus syndrome) ([place])     a. 12/2009 S/P MDT Adapta ADDR01 DC PPM, ser # [id number] ([name]).  . Hyperkalemia   . Asthma   . Seizures ([place])     a. last 30 years ago  . Rectal cancer ([place])     a. Status post colostomy  . Prostate cancer [facility])     Past Surgical History  Procedure Laterality Date  . Cardiac surgery  2011    Stents placed   . Cataract surgery  2012  . Colon surgery      cancer removal  . Knee arthroscopy Right 1980's  . Insert / replace / remove pacemaker    . Ablation  10/31/2013    CTI ablation  by Dr Caryl Comes for atrial flutter  . Atrial flutter ablation N/A 10/31/2013    Procedure: ATRIAL FLUTTER ABLATION;  Surgeon: [name], MD;  Location: [facility] C[facility] LAB;  Service: Cardiovascular;  Laterality: N/A;  . Cardiac [facility]  07/2009    [facility]  . Cardiac catheter[facility]  01/2013    [facility]  . Cardiac catheterization  03/2014    [facility]  . Coronary angioplasty    . Colon surgery      colostomy  . Cataract extraction w/phaco Left 06/01/2014    Procedure: CATARACT EXTRACTION PHACO AND INTRAOCULAR LENS PLACEMENT (IOC);  Surgeon: [name], MD;  Location: [name] ORS;  Service: Ophthalmology;  Laterality: Left;  . Cataract extraction Left   . Cardiac catheterization N/A 11/02/2014    Procedure: Left Heart Cath and Cors/Grafts Angiography;  Surgeon:  [name], MD;  Location: [facility] CV LAB;  Service: Cardiovascular;  Laterality: N/A;  . Cardiac catheterization N/A 11/02/2014    Procedure: Coronary Stent Intervention;  Surgeon: [name], MD;  Location: [facility] CV LAB;  Service: Cardiovascular;  Laterality: N/A;  . Cardiac catheterization N/A 11/03/2014    Procedure: Left Heart Cath and Coronary Angiography;  Surgeon: [name], MD;  Location: [place] CV LAB;  Service: Cardiovascular;  Laterality: N/A;    Current Outpatient Prescriptions  Medication Sig Dispense Refill  . albuterol (PROAIR HFA) 108 (90 Base) MCG/ACT inhaler Inhale 2 puffs into the lungs every 6 (six) hours as needed for wheezing or shortness of breath. 1 Inhaler 6  . albuterol (PROVENTIL) (2.5 MG/3ML) 0.083% nebulizer solution Take 3 mLs (2.5 mg total) by nebulization every 6 (six) hours as needed for wheezing or shortness of breath. DX code 493.20 120 mL 3  . arformoterol (BROVANA) 15 MCG/2ML NEBU Take 2 mLs (15 mcg total) by nebulization 2 (two) times daily. DX code 493.20    . aspirin 81 MG tablet Take 81 mg by mouth daily.    [name] atorvastatin (LIPITOR) 40 MG tablet Take 40 mg by mouth daily.    [name] azithromycin (ZITHROMAX) 250 MG tablet TAKE ONE (1) TABLET EACH DAY 30 each 2  . budesonide (PULMICORT) 0.5 MG/2ML nebulizer solution Take 2 mLs (0.5 mg total) by nebulization 2 (two) times daily. 240 mL 2  . Cetirizine HCl 10 MG TBDP Take 10 mg by mouth at bedtime. 30 tablet 3  . Cholecalciferol (VITAMIN D) 2000 UNITS tablet Take 2,000 Units by mouth daily.    . clopidogrel (PLAVIX) 75 MG tablet Take 1 tablet (75 mg total) by mouth daily with breakfast. 30 tablet 12  . degarelix (FIRMAGON) 120 MG injection Inject into the skin.    [name] isosorbide mononitrate (IMDUR) 120 MG 24 hr tablet Take 1 tablet (120 mg total) by mouth daily. 60 tablet 11  . metoprolol (LOPRESSOR) 50 MG tablet Take 1 tablet (50 mg total) by mouth 2 (two) times daily. 60  tablet 11  . nitroGLYCERIN (NITROSTAT) 0.3 MG SL tablet Place 1 tablet (0.3 mg total) under the tongue every 5 (five) minutes as needed for chest pain. 90 tablet 12  . nitroGLYCERIN (NITROSTAT) 0.4 MG SL tablet Place 1 tablet (0.4 mg total) under the tongue every 5 (five) minutes as needed for chest pain. 20 tablet 12  . nystatin cream (MYCOSTATIN) Use as directed by doctor    . tamsulosin (FLOMAX) 0.4 MG CAPS capsule Take 0.4 mg by mouth.    . traMADol (ULTRAM) 50 MG tablet Take 50 mg by  mouth daily as needed.    [name] Umeclidinium Bromide (INCRUSE ELLIPTA) 62.5 MCG/INH AEPB Inhale 1 puff into the lungs daily. 30 each 5   No current facility-administered medications for this visit.    No Known Allergies  Review of Systems negative except from HPI and PMH  Physical Exam BP 137/79 mmHg  Pulse 71  Ht 5\' 10"  (1.778 m)  Wt 199 lb 4 oz (90.379 kg)  BMI 28.59 kg/m2 Well developed and well nourished in no acute distress HENT normal E scleral and icterus clear Neck Supple JVP flat; carotids brisk and full Clear to ausculation Fast and irregular  rate and rhythm, no murmurs gallops or rub Soft with active bowel sounds No clubbing cyanosis no Edema Alert and oriented, grossly normal motor and sensory function Skin Warm and Dry  ECG demonstrates atrially paced rhythm with frequent PVCs  Assessment and  Plan  Atrial flutter-s/p ablation  Sinus node dysfunction  PVCs   Hypertension with orthostatic hypotension  Cehst pain  Coronary disease with prior stenting  And repeat stenting  HFpEF  COPD  prostsate Cancer  Anger/anxiety issue    Orthostasis is much improved  Blood pressure is reasonable  Having recurrent atypical symptoms with neck discomfort which has been a manifestation of ischemia in the past. I am not sure nitroglycerin responsive to time response. When he had this in the hospital, ECG didn't change; however, he did have a non-STEMI raising the concern. We will  discontinue his losartan as his blood pressure is well-controlled. We will increase his isosorbide mononitrate 220 mg and having taken all at one time in the morning. Increase his metoprolol from 25--50 mg twice daily.  I have suggested taht he followup with his PCP regarding antidepressant therapy  There is a total pressure with his prostate cancer and  his wife's health needs  ,More than 50% of 45 min was spent in counseling related to the above

## 2015-08-10 NOTE — Progress Notes (Signed)
Patient Care Team: Sofie Hartigan, MD as PCP - General (Family Medicine)   HPI  Donald Juarez is a 70 y.o. male Seen in followup for pacemaker implanted 2011 for sinus node dysfunction.  He also has a history of atrial flutter for which he underwent ablation 10/16   He also has problems with systolic hypertension and orthostatic intolerance; Amlodipine was stopped and imdur decreased.  2/17 imdur increased 2/2 angina/neck pain    Cath 2/16  Patent  left circumflex stents. There was 80% stenosis in the ostial right AV groove artery at the bifurcation of the PDA. This was not an optimal location for PCI and supplied a relatively small area Felt that the risk of PCI outweighs the benefit. Left ventricular end-diastolic pressure was 22. Myoview 4/16>> no ischemia EF 55%   I have reviewed the son of October 2016. Because of ongoing discomfort he underwent catheterization  With complex bifurcation lesion in the RCA distribution. He had protracted post intervention chest pain peak troponin was greater than 10;  catheterization demonstrated subtotal occlusion of the POBA site and it was felt that repeat intervention  greater risk than benefit   He continues to have discomfort in his neck primarily at night. He denies exertional discomfort although sometimes he is able to do much more than others. It is his impression that this is associated with a heart rate   We had also discussed the role of depression in his life;  He reviewed this with PCP and started on SSRI  He has however not fill the prescription as in the past he had significant hallucinatory side effects associated with antidepressants    His prostate problem has been identified as prostate cancer; he has had recurrent urinary tract infections. Radiation therapy is being reconsidered.  Past Medical History  Diagnosis Date  . COPD (chronic obstructive pulmonary disease) ([place])   . Carotid stenosis     a. 07/2013 Carotid  U/S: 100% RICA (pt prev reported h/o 99991111), LICA [id number], bilat ECA 50%, normal vertebrals and subclavians bilat-->F/U needed in 01/2014.  [name] Essential hypertension   . Paroxysmal atrial flutter ([place])     a. CHA2DS2VASc = 3 -> eliquis;  b. 07/2013 Echo: EF 50-55%, mildly dil LA.c. s/p RFCA 10/31/2013 by Dr Caryl Comes  . Syncope and collapse   . Coronary artery disease     a. 06/1996 & 03/2005 Cath: non-obs dzs;  b. 07/2009 NSTEMI/Cath: LCX 99->PCI/DES extending into OM1;  c. 01/2013 Cath: patent LCX stent-->Med Rx.; d. cath 03/2014: RPAV dzs->Med rx; e. 10/2014 Cath/PCI: LM 30, LAD 20p/m, LCX 50ost/p, 10 ISR, 60d, OM1 min irregs, OM2 50, RCA 30ost, 30m, 60d, RPDA 70(2.5x8 Xience DES), RPAV 90small(PTCA); f. 10/2014 Relook Cath: RPAV 99(Med Rx), EF 45-50.  [name] Hyperlipidemia   . Cataract, right     a. 10/2010 s/p cataract surgery   . Shingles     a. 09/2012.  . Pulmonary nodule, right     a. 0.7cm RLL - stable by CT 06/2013.  . Bilateral claudication of lower limb ([place])   . SSS (sick sinus syndrome) ([place])     a. 12/2009 S/P MDT Adapta ADDR01 DC PPM, ser # [id number] ([name]).  . Hyperkalemia   . Asthma   . Seizures ([place])     a. last 30 years ago  . Rectal cancer ([place])     a. Status post colostomy  . Prostate cancer [facility])     Past Surgical History  Procedure Laterality Date  . Cardiac surgery  2011    Stents placed   . Cataract surgery  2012  . Colon surgery      cancer removal  . Knee arthroscopy Right 1980's  . Insert / replace / remove pacemaker    . Ablation  10/31/2013    CTI ablation by Dr Caryl Comes for atrial flutter  . Atrial flutter ablation N/A 10/31/2013    Procedure: ATRIAL FLUTTER ABLATION;  Surgeon: [name], MD;  Location: [facility] CATH LAB;  Service: Cardiovascular;  Laterality: N/A;  . [facility]  . [facility]  [facility]2013    [facility]  . Cardiac catheterization  03/2014    [facility]  . Coronary angioplasty    . Colon surgery      colostomy  .  Cataract extraction w/phaco Left 06/01/2014    Procedure: CATARACT EXTRACTION PHACO AND INTRAOCULAR LENS PLACEMENT (IOC);  Surgeon: [name], MD;  Location: [name] ORS;  Service: Ophthalmology;  Laterality: Left;  . Cataract extraction Left   . Cardiac catheterization N/A 11/02/2014    Procedure: Left Heart Cath and Cors/Grafts Angiography;  Surgeon: [name], MD;  Location: [place] CV LAB;  Service: Cardiovascular;  Laterality: N/A;  . Cardiac catheterization N/A 11/02/2014    Procedure: Coronary Stent Intervention;  Surgeon: [name], MD;  Location: [place] CV LAB;  Service: Cardiovascular;  Laterality: N/A;  . Cardiac catheterization N/A 11/03/2014    Procedure: Left Heart Cath and Coronary Angiography;  Surgeon: [name], MD;  Location: [place] CV LAB;  Service: Cardiovascular;  Laterality: N/A;    Current Outpatient Prescriptions  Medication Sig Dispense Refill  . albuterol (PROAIR HFA) 108 (90 Base) MCG/ACT inhaler Inhale 2 puffs into the lungs every 6 (six) hours as needed for wheezing or shortness of breath. 1 Inhaler 6  . albuterol (PROVENTIL) (2.5 MG/3ML) 0.083% nebulizer solution Take 3 mLs (2.5 mg total) by nebulization every 6 (six) hours as needed for wheezing or shortness of breath. DX code 493.20 120 mL 3  . arformoterol (BROVANA) 15 MCG/2ML NEBU Take 2 mLs (15 mcg total) by nebulization 2 (two) times daily. DX code 493.20    . aspirin 81 MG tablet Take 81 mg by mouth daily.    [name] atorvastatin (LIPITOR) 40 MG tablet Take 40 mg by mouth daily.    [name] azithromycin (ZITHROMAX) 250 MG tablet TAKE ONE (1) TABLET EACH DAY 30 each 2  . budesonide (PULMICORT) 0.5 MG/2ML nebulizer solution Take 2 mLs (0.5 mg total) by nebulization 2 (two) times daily. 240 mL 2  . Cetirizine HCl 10 MG TBDP Take 10 mg by mouth at bedtime. 30 tablet 3  . Cholecalciferol (VITAMIN D) 2000 UNITS tablet Take 2,000 Units by mouth daily.    . clopidogrel (PLAVIX) 75 MG  tablet Take 1 tablet (75 mg total) by mouth daily with breakfast. 30 tablet 12  . degarelix (FIRMAGON) 120 MG injection Inject into the skin.    [name] isosorbide mononitrate (IMDUR) 120 MG 24 hr tablet Take 1 tablet (120 mg total) by mouth daily. 60 tablet 11  . metoprolol (LOPRESSOR) 50 MG tablet Take 1 tablet (50 mg total) by mouth 2 (two) times daily. 60 tablet 11  . nitroGLYCERIN (NITROSTAT) 0.3 MG SL tablet Place 1 tablet (0.3 mg total) under the tongue every 5 (five) minutes as needed for chest pain. 90 tablet 12  . nitroGLYCERIN (NITROSTAT) 0.4 MG SL tablet Place  1 tablet (0.4 mg total) under the tongue every 5 (five) minutes as needed for chest pain. 20 tablet 12  . nystatin cream (MYCOSTATIN) Use as directed by doctor    . tamsulosin (FLOMAX) 0.4 MG CAPS capsule Take 0.4 mg by mouth.    . traMADol (ULTRAM) 50 MG tablet Take 50 mg by mouth daily as needed.    [name] Umeclidinium Bromide (INCRUSE ELLIPTA) 62.5 MCG/INH AEPB Inhale 1 puff into the lungs daily. 30 each 5   No current facility-administered medications for this visit.    No Known Allergies  Review of Systems negative except from HPI and PMH  Physical Exam BP 137/79 mmHg  Pulse 71  Ht 5\' 10"  (1.778 m)  Wt 199 lb 4 oz (90.379 kg)  BMI 28.59 kg/m2 Well developed and well nourished in no acute distress HENT normal E scleral and icterus clear Neck Supple JVP flat; carotids brisk and full Clear to ausculation Fast and irregular  rate and rhythm, no murmurs gallops or rub Soft with active bowel sounds No clubbing cyanosis no Edema Alert and oriented, grossly normal motor and sensory function Skin Warm and Dry  ECG demonstrates atrially paced rhythm with frequent PVCs  Assessment and  Plan  Atrial flutter-s/p ablation  Sinus node dysfunction  PVCs   Hypertension with orthostatic hypotension  Cehst pain  Coronary disease with prior stenting  And repeat stenting  HFpEF  COPD  prostsate Cancer  Anger/anxiety  issue       Blood pressure is reasonable  We have tried to review his Doppler studies; he keeps crashing the computer  Encouraged him to follow-up with his PCP regarding depression management.  With his neck discomfort concerning for his anginal equivalents, this occurring primarily at night, we'll have him take his nitrates in the evening. With his recurring orthostasis we will decrease his morning metoprolol. Will also have him take his nitrates at night. In addition, we will start ranolazine for angina it being particularly potentially useful given its lack of hypotension   ,More than 50% of 45 min was spent in counseling related to the above

## 2015-08-16 LAB — CUP PACEART INCLINIC DEVICE CHECK
Implantable Lead Implant Date: 20111219
Implantable Lead Location: 753859
Implantable Lead Model: 5092
MDC IDC LEAD IMPLANT DT: 20111219
MDC IDC LEAD LOCATION: 753860
MDC IDC SESS DTM: 20170717125212

## 2015-08-17 ENCOUNTER — Encounter: Payer: Self-pay | Admitting: Internal Medicine

## 2015-08-19 ENCOUNTER — Encounter: Payer: Self-pay | Admitting: Internal Medicine

## 2015-09-06 ENCOUNTER — Telehealth: Payer: Self-pay | Admitting: Internal Medicine

## 2015-09-06 MED ORDER — RANOLAZINE ER 500 MG PO TB12: 500.0000 mg | ORAL_TABLET | Freq: Two times a day (BID) | ORAL | 6 refills | Status: DC

## 2015-09-06 NOTE — Telephone Encounter (Signed)
Pt asks does he need a rx for Ranexa. States Dr. Caryl Comes only gave him 7. States he is not sure if the medication is working, states he is not sure "what the medicine is supposed to do". States he is "feeling a little better". Please call.

## 2015-09-06 NOTE — Telephone Encounter (Signed)
Pt states since starting Ranexa he no longer has jaw pain. Pt advised to continue Ranexa, I will send in a prescription to his pharmacy.

## 2015-09-27 ENCOUNTER — Emergency Department
Admission: EM | Admit: 2015-09-27 | Discharge: 2015-09-27 | Discharge status: Against Medical Advice | Payer: Medicare Other

## 2015-09-28 ENCOUNTER — Ambulatory Visit (INDEPENDENT_AMBULATORY_CARE_PROVIDER_SITE_OTHER): Payer: Medicare Other | Admitting: Internal Medicine

## 2015-09-28 ENCOUNTER — Encounter (INDEPENDENT_AMBULATORY_CARE_PROVIDER_SITE_OTHER): Payer: Self-pay

## 2015-09-28 ENCOUNTER — Encounter: Payer: Self-pay | Admitting: Internal Medicine

## 2015-09-28 VITALS — BP 122/66 | HR 131 | Ht 70.0 in | Wt 199.0 lb

## 2015-09-28 DIAGNOSIS — I951 Orthostatic hypotension: Secondary | ICD-10-CM

## 2015-09-28 DIAGNOSIS — Z950 Presence of cardiac pacemaker: Secondary | ICD-10-CM | POA: Diagnosis not present

## 2015-09-28 DIAGNOSIS — I493 Ventricular premature depolarization: Secondary | ICD-10-CM

## 2015-09-28 DIAGNOSIS — I4892 Unspecified atrial flutter: Secondary | ICD-10-CM

## 2015-09-28 DIAGNOSIS — I495 Sick sinus syndrome: Secondary | ICD-10-CM

## 2015-09-28 DIAGNOSIS — I5030 Unspecified diastolic (congestive) heart failure: Secondary | ICD-10-CM

## 2015-09-28 LAB — CUP PACEART INCLINIC DEVICE CHECK
Brady Statistic AP VS Percent: 96 %
Brady Statistic AS VS Percent: 3 %
Implantable Lead Implant Date: 20111219
Implantable Lead Implant Date: 20111219
Implantable Lead Location: 753860
Implantable Lead Model: 5594
Lead Channel Impedance Value: 472 Ohm
Lead Channel Impedance Value: 562 Ohm
Lead Channel Pacing Threshold Amplitude: 2 V
Lead Channel Pacing Threshold Pulse Width: 0.4 ms
Lead Channel Pacing Threshold Pulse Width: 1.5 ms
Lead Channel Setting Pacing Amplitude: 2 V
Lead Channel Setting Sensing Sensitivity: 2 mV
MDC IDC LEAD LOCATION: 753859
MDC IDC MSMT BATTERY IMPEDANCE: 1366 Ohm
MDC IDC MSMT BATTERY REMAINING LONGEVITY: 12 mo
MDC IDC MSMT BATTERY VOLTAGE: 2.69 V
MDC IDC MSMT LEADCHNL RA SENSING INTR AMPL: 1 mV
MDC IDC MSMT LEADCHNL RV PACING THRESHOLD AMPLITUDE: 0.5 V
MDC IDC MSMT LEADCHNL RV PACING THRESHOLD AMPLITUDE: 0.5 V
MDC IDC MSMT LEADCHNL RV PACING THRESHOLD PULSEWIDTH: 0.4 ms
MDC IDC MSMT LEADCHNL RV SENSING INTR AMPL: 5.6 mV
MDC IDC SESS DTM: 20170829142741
MDC IDC SET LEADCHNL RA PACING AMPLITUDE: 4.5 V
MDC IDC SET LEADCHNL RV PACING PULSEWIDTH: 0.4 ms
MDC IDC STAT BRADY AP VP PERCENT: 0 %
MDC IDC STAT BRADY AS VP PERCENT: 0 %

## 2015-09-28 NOTE — Progress Notes (Signed)
Patient Care Team: Sofie Hartigan, MD as PCP - General (Family Medicine)   HPI  Donald Juarez is a 70 y.o. male Seen in followup for pacemaker implanted 2011 for sinus node dysfunction.  He also has a history of atrial flutter for which he underwent ablation 10/16   He also has problems with systolic hypertension and orthostatic intolerance; Amlodipine was stopped and imdur decreased.  2/17 imdur increased 2/2 angina/neck pain    Cath 2/16  Patent  left circumflex stents. There was 80% stenosis in the ostial right AV groove artery at the bifurcation of the PDA. This was not an optimal location for PCI and supplied a relatively small area Felt that the risk of PCI outweighs the benefit. Left ventricular end-diastolic pressure was 22. Myoview 4/16>> no ischemia EF 55%   I have reviewed the noes of October 2016. Because of ongoing discomfort he underwent catheterization  With complex bifurcation lesion in the RCA distribution. He had protracted post intervention chest pain peak troponin was greater than 10;  catheterization demonstrated subtotal occlusion of the POBA site and it was felt that repeat intervention  greater risk than benefit   He continues to have discomfort in his neck primarily at night. He denies exertional discomfort although sometimes he is able to do much more than others. It is his impression that this is associated with a heart rate.  This was attenuated by changing his nitrates to PM  Orhtostasis was much improved by decreasing the metopr 50>>25 in am and changing nitrates to PM   We had also discussed the role of depression in his life;  He reviewed this with PCP and started on SSRI   not consummated   He still gets angry--very angry     Past Medical History:  Diagnosis Date  . Asthma   . Bilateral claudication of lower limb ([place])   . Carotid stenosis    a. 07/2013 Carotid U/S: 100% RICA (pt prev reported h/o 99991111), LICA [id number], bilat ECA 50%, normal  vertebrals and subclavians bilat-->F/U needed in 01/2014.  . Cataract, right    a. 10/2010 s/p cataract surgery   . COPD (chronic obstructive pulmonary disease) ([place])   . Coronary artery disease    a. 06/1996 & 03/2005 Cath: non-obs dzs;  b. 07/2009 NSTEMI/Cath: LCX 99->PCI/DES extending into OM1;  c. 01/2013 Cath: patent LCX stent-->Med Rx.; d. cath 03/2014: RPAV dzs->Med rx; e. 10/2014 Cath/PCI: LM 30, LAD 20p/m, LCX 50ost/p, 10 ISR, 60d, OM1 min irregs, OM2 50, RCA 30ost, 26m, 60d, RPDA 70(2.5x8 Xience DES), RPAV 90small(PTCA); f. 10/2014 Relook Cath: RPAV 99(Med Rx), EF 45-50.  [name] Essential hypertension   . Hyperkalemia   . Hyperlipidemia   . Paroxysmal atrial flutter ([place])    a. CHA2DS2VASc = 3 -> eliquis;  b. 07/2013 Echo: EF 50-55%, mildly dil LA.c. s/p RFCA 10/31/2013 by Dr Caryl Comes  . Prostate cancer ([place])   . Pulmonary nodule, right    a. 0.7cm RLL - stable by CT 06/2013.  [name] Rectal cancer ([place])    a. Status post colostomy  . Seizures ([place])    a. last 30 years ago  . Shingles    a. 09/2012.  . SSS (sick sinus syndrome) ([place])    a. 12/2009 S/P MDT Adapta ADDR01 DC PPM, ser # [id number] ([name]).  . Syncope and collapse     Past Surgical History:  Procedure Laterality Date  . ABLATION  10/31/2013   CTI ablation by  Dr Caryl Comes for atrial flutter  . ATRIAL FLUTTER ABLATION N/A 10/31/2013   Procedure: ATRIAL FLUTTER ABLATION;  Surgeon: [name], MD;  Location: [facility] CATH LAB;  Service: Cardiovascular;  Laterality: N/A;  . CARDIAC CATHETERIZATION  07/2009   [facility]  . CARDIAC CATHETERIZATION  01/2013   [facility]  . CARDIAC CATHETERIZATION  03/2014   [facility]  . CARDIAC CATHETERIZATION N/A 11/02/2014   Procedure: Left Heart Cath and Cors/Grafts Angiography;  Surgeon: [name], MD;  Location: [place] CV LAB;  Service: Cardiovascular;  Laterality: N/A;  . CARDIAC CATHETERIZATION N/A 11/02/2014   Procedure: Coronary Stent Intervention;  Surgeon: [name], MD;  Location: [place]  CV LAB;  Service: Cardiovascular;  Laterality: N/A;  . CARDIAC CATHETERIZATION N/A 11/03/2014   Procedure: Left Heart Cath and Coronary Angiography;  Surgeon: [name], MD;  Location: [place] CV LAB;  Service: Cardiovascular;  Laterality: N/A;  . CARDIAC SURGERY  2011   Stents placed   . CATARACT EXTRACTION Left   . CATARACT EXTRACTION W/PHACO Left 06/01/2014   Procedure: CATARACT EXTRACTION PHACO AND INTRAOCULAR LENS PLACEMENT (IOC);  Surgeon: [name], MD;  Location: [name] ORS;  Service: Ophthalmology;  Laterality: Left;  . cataract surgery  2012  . colon surgery     cancer removal  . COLON SURGERY     colostomy  . CORONARY ANGIOPLASTY    . INSERT / REPLACE / REMOVE PACEMAKER    . KNEE ARTHROSCOPY Right 1980's    Current Outpatient Prescriptions  Medication Sig Dispense Refill  . albuterol (PROAIR HFA) 108 (90 Base) MCG/ACT inhaler Inhale 2 puffs into the lungs every 6 (six) hours as needed for wheezing or shortness of breath. 1 Inhaler 6  . albuterol (PROVENTIL) (2.5 MG/3ML) 0.083% nebulizer solution Take 3 mLs (2.5 mg total) by nebulization every 6 (six) hours as needed for wheezing or shortness of breath. DX code 493.20 120 mL 3  . arformoterol (BROVANA) 15 MCG/2ML NEBU Take 2 mLs (15 mcg total) by nebulization 2 (two) times daily. DX code 493.20    . aspirin 81 MG tablet Take 81 mg by mouth daily.    [name] atorvastatin (LIPITOR) 40 MG tablet Take 40 mg by mouth daily.    . budesonide (PULMICORT) 0.5 MG/2ML nebulizer solution Take 2 mLs (0.5 mg total) by nebulization 2 (two) times daily. 240 mL 2  . Cholecalciferol (VITAMIN D) 2000 UNITS tablet Take 2,000 Units by mouth daily.    . clopidogrel (PLAVIX) 75 MG tablet Take 1 tablet (75 mg total) by mouth daily with breakfast. 30 tablet 12  . degarelix (FIRMAGON) 120 MG injection Inject into the skin.    [name] isosorbide mononitrate (IMDUR) 120 MG 24 hr tablet Take one tablet (120 mg) by mouth at bedtime    . metoprolol  tartrate (LOPRESSOR) 25 MG tablet Take one tablet (25 mg) in the morning and two tablets (50 mg) in the evening 90 tablet 11  . nitroGLYCERIN (NITROSTAT) 0.4 MG SL tablet Place 1 tablet (0.4 mg total) under the tongue every 5 (five) minutes as needed for chest pain. 20 tablet 12  . nystatin cream (MYCOSTATIN) Use as directed by doctor    . tamsulosin (FLOMAX) 0.4 MG CAPS capsule Take 0.4 mg by mouth.    . traMADol (ULTRAM) 50 MG tablet Take 50 mg by mouth daily as needed.    [name] Umeclidinium Bromide (INCRUSE ELLIPTA) 62.5 MCG/INH AEPB Inhale 1 puff into the lungs daily. 30 each 5  No current facility-administered medications for this visit.     No Known Allergies  Review of Systems negative except from HPI and PMH  Physical Exam BP 122/66 (BP Location: Left Arm, Patient Position: Sitting, Cuff Size: Normal)   Pulse (!) 131   Ht 5\' 10"  (1.778 m)   Wt 199 lb (90.3 kg)   BMI 28.55 kg/m  Well developed and well nourished in no acute distress HENT normal E scleral and icterus clear Neck Supple JVP flat; carotids brisk and full Clear to ausculation Fast and irregular  rate and rhythm, no murmurs gallops or rub Soft with active bowel sounds No clubbing cyanosis no Edema Alert and oriented, grossly normal motor and sensory function Skin Warm and Dry  ECG demonstrates atrially paced rhythm with no PVCs 24/*10/45   Assessment and  Plan  Atrial flutter-s/p ablation  Sinus node dysfunction  PVCs   Hypertension with orthostatic hypotension  Chest pain  Coronary disease with prior stenting  And repeat stenting  HFpEF  COPD  prostsate Cancer  Anger/anxiety issue       Blood pressure is reasonable  But with spike two days ago suggested he take metop 50 q 6h x 2 and if still elevated to go to ER  Encouraged him to follow-up with his PCP regarding depression and anger management.      ,More than 50% of 25 min was spent in counseling related to the above        wake and INRs failure on these are numerous medical history is A [id number]

## 2015-09-28 NOTE — Patient Instructions (Signed)
Medication Instructions: - Your physician recommends that you continue on your current medications as directed. Please refer to the Current Medication list given to you today.  Labwork: - none today  Procedures/Testing: - none today  Follow-Up: - Remote monitoring is used to monitor your Pacemaker of ICD from home. This monitoring reduces the number of office visits required to check your device to one time per year. It allows [place] to keep an eye on the functioning of your device to ensure it is working properly. You are scheduled for a device check from home on 12/28/15. You may send your transmission at any time that day. If you have a wireless device, the transmission will be sent automatically. After your physician reviews your transmission, you will receive a postcard with your next transmission date.  - Your physician wants you to follow-up in: 6 months with Dr. Caryl Comes. You will receive a reminder letter in the mail two months in advance. If you don't receive a letter, please call our office to schedule the follow-up appointment.   Any Additional Special Instructions Will Be Listed Below (If Applicable).     If you need a refill on your cardiac medications before your next appointment, please call your pharmacy.

## 2015-10-05 NOTE — Progress Notes (Deleted)
Cardiology Office Note   Date:  10/05/2015   ID:  Donald Juarez, DOB 07/13/1945, MRN [id number]  Referring Doctor:  Donald Hartigan, MD   Cardiologist:   Donald Bushy, MD   Reason for consultation:  No chief complaint on file.     History of Present Illness: Donald Juarez is a 70 y.o. male who presents for ***   ROS:  Please see the history of present illness. Aside from mentioned under HPI, all other systems are reviewed and negative.     Past Medical History:  Diagnosis Date  . Asthma   . Bilateral claudication of lower limb ([place])   . Carotid stenosis    a. 07/2013 Carotid U/S: 100% RICA (pt prev reported h/o [id number]), LICA [id number], bilat ECA 50%, normal vertebrals and subclavians bilat-->F/U needed in 01/2014.  . Cataract, right    a. 10/2010 s/p cataract surgery   . COPD (chronic obstructive pulmonary disease) ([place])   . Coronary artery disease    a. 06/1996 & 03/2005 Cath: non-obs dzs;  b. 07/2009 NSTEMI/Cath: LCX 99->PCI/DES extending into OM1;  c. 01/2013 Cath: patent LCX stent-->Med Rx.; d. cath 03/2014: RPAV dzs->Med rx; e. 10/2014 Cath/PCI: LM 30, LAD 20p/m, LCX 50ost/p, 10 ISR, 60d, OM1 min irregs, OM2 50, RCA 30ost, 76m, 60d, RPDA 70(2.5x8 Xience DES), RPAV 90small(PTCA); f. 10/2014 Relook Cath: RPAV 99(Med Rx), EF 45-50.  [name] Essential hypertension   . Hyperkalemia   . Hyperlipidemia   . Paroxysmal atrial flutter ([place])    a. CHA2DS2VASc = 3 -> eliquis;  b. 07/2013 Echo: EF 50-55%, mildly dil LA.c. s/p RFCA 10/31/2013 by Dr Donald Juarez  . Prostate cancer ([place])   . Pulmonary nodule, right    a. 0.7cm RLL - stable by CT 06/2013.  [name] Rectal cancer ([place])    a. Status post colostomy  . Seizures (Donald Juarez)    a. last 30 years ago  . Shingles    a. 09/2012.  . SSS (sick sinus syndrome) ([place])    a. 12/2009 S/P MDT Adapta ADDR01 DC PPM, ser # [id number] ([name]).  . Syncope and collapse     Past Surgical History:  Procedure Laterality Date  . ABLATION  10/31/2013   CTI ablation  by Dr Donald Juarez for atrial flutter  . ATRIAL FLUTTER ABLATION N/A 10/31/2013   Procedure: ATRIAL FLUTTER ABLATION;  Surgeon: Donald Sprang, MD;  Location: [facility] CATH LAB;  Service: Cardiovascular;  Laterality: N/A;  . CARDIAC CATHETERIZATION  07/2009   [facility]  . CARDIAC CATHETERIZATION  01/2013   [facility]  . CARDIAC CATHETERIZATION  03/2014   [facility]  . CARDIAC CATHETERIZATION N/A 11/02/2014   Procedure: Left Heart Cath and Cors/Grafts Angiography;  Surgeon: Donald Hampshire, MD;  Location: [facility] LAB;  Service: Cardiovascular;  Laterality: N/A;  . CARDIAC CATHETERIZATION N/A 11/02/2014   Procedure: Coronary Stent Intervention;  Surgeon: Donald Hampshire, MD;  Location: [facility] LAB;  Service: Cardiovascular;  Laterality: N/A;  . CARDIAC CATHETERIZATION N/A 11/03/2014   Procedure: Left Heart Cath and Coronary Angiography;  Surgeon: Donald Hampshire, MD;  Location: [facility] LAB;  Service: Cardiovascular;  Laterality: N/A;  . CARDIAC SURGERY  2011   Stents placed   . CATARACT EXTRACTION Left   . CATARACT EXTRACTION W/PHACO Left 06/01/2014   Procedure: CATARACT EXTRACTION PHACO AND INTRAOCULAR LENS PLACEMENT (IOC);  Surgeon: Donald Cotta, MD;  Location: [name] ORS;  Service: Ophthalmology;  Laterality: Left;  . cataract surgery  2012  . colon surgery     cancer removal  . COLON SURGERY     colostomy  . CORONARY ANGIOPLASTY    . INSERT / REPLACE / REMOVE PACEMAKER    . KNEE ARTHROSCOPY Right 1980's     reports that he quit smoking about 7 years ago. His smoking use included Cigarettes. He has a 30.00 pack-year smoking history. He has never used smokeless tobacco. He reports that he does not drink alcohol or use drugs.   family history includes Cancer in his mother; Heart attack in his father; Heart disease in his father; Hyperlipidemia in his father and mother; Hypertension in his father and mother.   Outpatient Medications Prior to Visit  Medication Sig Dispense Refill  .  albuterol (PROAIR HFA) 108 (90 Base) MCG/ACT inhaler Inhale 2 puffs into the lungs every 6 (six) hours as needed for wheezing or shortness of breath. 1 Inhaler 6  . albuterol (PROVENTIL) (2.5 MG/3ML) 0.083% nebulizer solution Take 3 mLs (2.5 mg total) by nebulization every 6 (six) hours as needed for wheezing or shortness of breath. DX code 493.20 120 mL 3  . arformoterol (BROVANA) 15 MCG/2ML NEBU Take 2 mLs (15 mcg total) by nebulization 2 (two) times daily. DX code 493.20    . aspirin 81 MG tablet Take 81 mg by mouth daily.    [name] atorvastatin (LIPITOR) 40 MG tablet Take 40 mg by mouth daily.    . budesonide (PULMICORT) 0.5 MG/2ML nebulizer solution Take 2 mLs (0.5 mg total) by nebulization 2 (two) times daily. 240 mL 2  . Cholecalciferol (VITAMIN D) 2000 UNITS tablet Take 2,000 Units by mouth daily.    . clopidogrel (PLAVIX) 75 MG tablet Take 1 tablet (75 mg total) by mouth daily with breakfast. 30 tablet 12  . degarelix (FIRMAGON) 120 MG injection Inject into the skin.    [name] isosorbide mononitrate (IMDUR) 120 MG 24 hr tablet Take one tablet (120 mg) by mouth at bedtime    . metoprolol tartrate (LOPRESSOR) 25 MG tablet Take one tablet (25 mg) in the morning and two tablets (50 mg) in the evening 90 tablet 11  . nitroGLYCERIN (NITROSTAT) 0.4 MG SL tablet Place 1 tablet (0.4 mg total) under the tongue every 5 (five) minutes as needed for chest pain. 20 tablet 12  . nystatin cream (MYCOSTATIN) Use as directed by doctor    . tamsulosin (FLOMAX) 0.4 MG CAPS capsule Take 0.4 mg by mouth.    . traMADol (ULTRAM) 50 MG tablet Take 50 mg by mouth daily as needed.    [name] Umeclidinium Bromide (INCRUSE ELLIPTA) 62.5 MCG/INH AEPB Inhale 1 puff into the lungs daily. 30 each 5   No facility-administered medications prior to visit.      Allergies: Review of patient's allergies indicates no known allergies.    PHYSICAL EXAM: VS:  There were no vitals taken for this visit. , There is no height or weight on file  to calculate BMI. Wt Readings from Last 3 Encounters:  09/28/15 199 lb (90.3 kg)  08/10/15 199 lb 4 oz (90.4 kg)  06/08/15 201 lb (91.2 kg)    GENERAL:  well developed, well nourished, *** obese, not in acute distress HEENT: normocephalic, pink conjunctivae, anicteric sclerae, no xanthelasma, normal dentition, oropharynx clear NECK:  no neck vein engorgement, JVP normal, no hepatojugular reflux, carotid upstroke brisk and symmetric, no bruit, no thyromegaly, no lymphadenopathy LUNGS:  good respiratory effort, clear to auscultation bilaterally CV:  PMI not displaced, no  thrills, no lifts, S1 and S2 within normal limits, no palpable S3 or S4, no murmurs, no rubs, no gallops ABD:  Soft, nontender, nondistended, normoactive bowel sounds, no abdominal aortic bruit, no hepatomegaly, no splenomegaly MS: nontender back, no kyphosis, no scoliosis, no joint deformities EXT:  2+ DP/PT pulses, no edema, no varicosities, no cyanosis, no clubbing SKIN: warm, nondiaphoretic, normal turgor, no ulcers NEUROPSYCH: alert, oriented to person, place, and time, sensory/motor grossly intact, normal mood, appropriate affect  Recent Labs: 11/04/2014: BUN 13; Creatinine, Ser 0.73; Hemoglobin 14.6; Platelets 196; Potassium 4.3; Sodium 140   Lipid Panel No results found for: CHOL, TRIG, HDL, CHOLHDL, VLDL, LDLCALC, LDLDIRECT   Other studies Reviewed:  EKG:  The ekg from *** was personally reviewed by me and it revealed ***  Additional studies/ records that were reviewed personally reviewed by me today include:  Left heart cath 11/03/2014:  LM lesion, 30% stenosed.  Dist Cx lesion, 60% stenosed.  Prox LAD to Mid LAD lesion, 20% stenosed.  Ost Cx to Prox Cx lesion, 50% stenosed.  Prox Cx lesion, 10% stenosed. The lesion was previously treated with a stent (unknown type) greater than two years ago.  2nd Mrg lesion, 50% stenosed.  Ost RCA lesion, 30% stenosed.  Mid RCA lesion, 40% stenosed.  Dist RCA  lesion, 60% stenosed.  A drug-eluting stent was placed.  Post Atrio-2 lesion, 40% stenosed. The lesion was previously treated with angioplasty .  Post Atrio-1 lesion, 99% stenosed.  There is mild left ventricular systolic dysfunction.   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 There is TIMI 2 flow in that area. This is the culprit for non-ST elevation myocardial infarction post PCI.  2. Mildly reduced LV systolic function with basal inferior wall hypokinesis and ejection fraction of 45-50%. Normal left ventricular end-diastolic pressure.  Recommendations: Given that this is a bifurcation area, intervention might adversely affect the ostial right PDA stent which serves as a bigger territory. The supplied area is small to medium in size and the patient is currently chest pain-free. Thus, given the above, I think the risks of PCI outweigh the benefits at the present time. Recommend optimal blood pressure control. I increased the dose of metoprolol and added losartan.  Possible discharge home tomorrow if blood pressure is controlled and chest pain-free.  Carotid ultrasound 01/20/2015: Heterogeneous plaque, bilaterally Chronically occluded right ICA Stable 60-79% left ICA stenosis Greater than 50% ECA stenosis, bilaterally Normal subclavian arteries, bilaterally Patent vertebral arteries with antegrade flow Follow-up one year  ASSESSMENT AND PLAN: Atrial flutter-s/p ablation  Sinus node dysfunction  PVCs   Hypertension with orthostatic hypotension  Chest pain  Coronary disease with prior stenting  And repeat stenting  HFpEF   Current medicines are reviewed at length with the patient today.  The patient {ACTIONS; HAS/DOES NOT HAVE:[id number]} concerns regarding medicines.  Labs/ tests ordered today include: No orders of the defined types were placed in this  encounter.   I had a lengthy and detailed discussion with the patient regarding diagnoses, prognosis, diagnostic options, treatment options ***, and side effects of medications.   I counseled the patient on importance of lifestyle modification including heart healthy diet, regular physical activity *** , and smoking cessation.   Disposition:   FU with undersigned after tests ***   Signed, Donald Bushy, MD  10/05/2015 2:57 PM    [facility]  This note was generated in part with voice recognition software and I apologize for any typographical errors that were not detected and corrected.

## 2015-10-06 ENCOUNTER — Encounter: Payer: Self-pay | Admitting: Internal Medicine

## 2015-10-06 ENCOUNTER — Ambulatory Visit (INDEPENDENT_AMBULATORY_CARE_PROVIDER_SITE_OTHER): Payer: Medicare Other | Admitting: Internal Medicine

## 2015-10-06 VITALS — BP 130/68 | HR 85 | Ht 69.0 in | Wt 198.8 lb

## 2015-10-06 DIAGNOSIS — R05 Cough: Secondary | ICD-10-CM

## 2015-10-06 DIAGNOSIS — R059 Cough, unspecified: Secondary | ICD-10-CM

## 2015-10-06 MED ORDER — UMECLIDINIUM BROMIDE 62.5 MCG/INH IN AEPB: 1.0000 | INHALATION_SPRAY | Freq: Every day | RESPIRATORY_TRACT | 0 refills | Status: AC

## 2015-10-06 MED ORDER — GUAIFENESIN-CODEINE 100-10 MG/5ML PO SOLN: 5.0000 mL | ORAL | 1 refills | Status: DC | PRN

## 2015-10-06 NOTE — Progress Notes (Signed)
   Subjective:    Patient ID: Donald Juarez, male    DOB: 1945-12-25, 70 y.o.   MRN: [id number]  Synopsis: Mr. Behlen first saw the [facility] clinic in 2014 for COPD after smoking 1 PPD for 30 years and quitting in 2010.  He has frequent exacerbations.    03/2013 Full PFT> Ratio 53% FEV1 1.89 (65% pred, 13% change), TLC 5.83 L (93% pred), DLCO 16.6 (69% pred) Quit tobacco 2010 Recent Cardiac Stent 11/16  CC: chronic SOB, follow up COPD  HPI  Has chronic SOB, DOE. With  Intermittent wheezing-feeling much better  No signs of infection at this time Has difficulty with incontinence, prostate obstruction from prostate cancer Patient was started on azithromycin to prevent COPD exacerbations and he feels that this has helped a lot-has not had to be on steroids since starting this  Has intermittent coughing spells-no wheezing   Inhaler regimen as follows 7AM-8AM albuterol neb/Brovana/takes incruse Albuterol Neb throughout the day as needed Refuses to obtain sleep study   Review of Systems  Constitutional: Negative for chills, fatigue and fever.  HENT: Negative for congestion.   Respiratory: Positive for cough. Negative for chest tightness, shortness of breath and wheezing.   Cardiovascular: Negative for chest pain, palpitations and leg swelling.  All other systems reviewed and are negative.      Objective:   Physical Exam  Constitutional: He is oriented to person, place, and time. He appears well-developed and well-nourished. No distress.  HENT:  Head: Normocephalic and atraumatic.  Mouth/Throat: No oropharyngeal exudate.  Eyes: EOM are normal.  Cardiovascular: Normal rate, regular rhythm and normal heart sounds.   No murmur heard. Pulmonary/Chest: No stridor. No respiratory distress. He has no wheezes.  Musculoskeletal: Normal range of motion. He exhibits no edema.  Neurological: He is alert and oriented to person, place, and time.  Skin: Skin is warm. He is  not diaphoretic.  Psychiatric: He has a normal mood and affect.   BP 130/68 (BP Location: Left Arm, Cuff Size: Normal)   Pulse 85   Ht 5\' 9"  (1.753 m)   Wt 198 lb 12.8 oz (90.2 kg)   SpO2 94%   BMI 29.36 kg/m    03/12/2014 left heart catheterization reviewed> moderately elevated LVEDP, patent stents, occlusion noted in a bifurcation in the RCA territory, LVEF 65% 05/10/2014 chest x-ray images personally reviewed there are no pulmonary infiltrates but there is emphysema bilaterally pacemaker in place    Assessment & Plan:  70 yo white male with moderate  COPD with chronic SOB COPD, GOLD GRADE C  1.continue Incruse 2.albuterol NEB as needed and QHS 3.continue zithromax 250 mg daily for COPD exac prevention 4.continue pulmciort/LABA(brovana) as prescribed 5.robitussin with codeine for cough  Follow up 6 month to assess interval changes  The Patient requires high complexity decision making for assessment and support, frequent evaluation and titration of therapies. Patient  satisfied with Plan of action and management. All questions answered  Corrin Parker, M.D.  [facility]  Medical Director [place] Director [facility] Cardio-Pulmonary Department

## 2015-10-06 NOTE — Patient Instructions (Signed)
Continue inhalers as prescribed Chronic Obstructive Pulmonary Disease Chronic obstructive pulmonary disease (COPD) is a common lung condition in which airflow from the lungs is limited. COPD is a general term that can be used to describe many different lung problems that limit airflow, including both chronic bronchitis and emphysema. If you have COPD, your lung function will probably never return to normal, but there are measures you can take to improve lung function and make yourself feel better. CAUSES   Smoking (common).  Exposure to secondhand smoke.  Genetic problems.  Chronic inflammatory lung diseases or recurrent infections. SYMPTOMS  Shortness of breath, especially with physical activity.  Deep, persistent (chronic) cough with a large amount of thick mucus.  Wheezing.  Rapid breaths (tachypnea).  Gray or bluish discoloration (cyanosis) of the skin, especially in your fingers, toes, or lips.  Fatigue.  Weight loss.  Frequent infections or episodes when breathing symptoms become much worse (exacerbations).  Chest tightness. DIAGNOSIS Your health care provider will take a medical history and perform a physical examination to diagnose COPD. Additional tests for COPD may include:  Lung (pulmonary) function tests.  Chest X-ray.  CT scan.  Blood tests. TREATMENT  Treatment for COPD may include:  Inhaler and nebulizer medicines. These help manage the symptoms of COPD and make your breathing more comfortable.  Supplemental oxygen. Supplemental oxygen is only helpful if you have a low oxygen level in your blood.  Exercise and physical activity. These are beneficial for nearly all people with COPD.  Lung surgery or transplant.  Nutrition therapy to gain weight, if you are underweight.  Pulmonary rehabilitation. This may involve working with a team of health care providers and specialists, such as respiratory, occupational, and physical therapists. HOME CARE  INSTRUCTIONS  Take all medicines (inhaled or pills) as directed by your health care provider.  Avoid over-the-counter medicines or cough syrups that dry up your airway (such as antihistamines) and slow down the elimination of secretions unless instructed otherwise by your health care provider.  If you are a smoker, the most important thing that you can do is stop smoking. Continuing to smoke will cause further lung damage and breathing trouble. Ask your health care provider for help with quitting smoking. He or she can direct you to community resources or hospitals that provide support.  Avoid exposure to irritants such as smoke, chemicals, and fumes that aggravate your breathing.  Use oxygen therapy and pulmonary rehabilitation if directed by your health care provider. If you require home oxygen therapy, ask your health care provider whether you should purchase a pulse oximeter to measure your oxygen level at home.  Avoid contact with individuals who have a contagious illness.  Avoid extreme temperature and humidity changes.  Eat healthy foods. Eating smaller, more frequent meals and resting before meals may help you maintain your strength.  Stay active, but balance activity with periods of rest. Exercise and physical activity will help you maintain your ability to do things you want to do.  Preventing infection and hospitalization is very important when you have COPD. Make sure to receive all the vaccines your health care provider recommends, especially the pneumococcal and influenza vaccines. Ask your health care provider whether you need a pneumonia vaccine.  Learn and use relaxation techniques to manage stress.  Learn and use controlled breathing techniques as directed by your health care provider. Controlled breathing techniques include:  Pursed lip breathing. Start by breathing in (inhaling) through your nose for 1 second. Then, purse your lips  as if you were going to whistle and  breathe out (exhale) through the pursed lips for 2 seconds.  Diaphragmatic breathing. Start by putting one hand on your abdomen just above your waist. Inhale slowly through your nose. The hand on your abdomen should move out. Then purse your lips and exhale slowly. You should be able to feel the hand on your abdomen moving in as you exhale.  Learn and use controlled coughing to clear mucus from your lungs. Controlled coughing is a series of short, progressive coughs. The steps of controlled coughing are: 1. Lean your head slightly forward. 2. Breathe in deeply using diaphragmatic breathing. 3. Try to hold your breath for 3 seconds. 4. Keep your mouth slightly open while coughing twice. 5. Spit any mucus out into a tissue. 6. Rest and repeat the steps once or twice as needed. SEEK MEDICAL CARE IF:  You are coughing up more mucus than usual.  There is a change in the color or thickness of your mucus.  Your breathing is more labored than usual.  Your breathing is faster than usual. SEEK IMMEDIATE MEDICAL CARE IF:  You have shortness of breath while you are resting.  You have shortness of breath that prevents you from:  Being able to talk.  Performing your usual physical activities.  You have chest pain lasting longer than 5 minutes.  Your skin color is more cyanotic than usual.  You measure low oxygen saturations for longer than 5 minutes with a pulse oximeter. MAKE SURE YOU:  Understand these instructions.  Will watch your condition.  Will get help right away if you are not doing well or get worse.   This information is not intended to replace advice given to you by your health care provider. Make sure you discuss any questions you have with your health care provider.   Document Released: 10/26/2004 Document Revised: 02/06/2014 Document Reviewed: 09/12/2012 Elsevier Interactive Patient Education [employer].

## 2015-10-11 ENCOUNTER — Encounter: Payer: Self-pay | Admitting: Internal Medicine

## 2015-10-12 ENCOUNTER — Ambulatory Visit: Payer: Medicare Other | Admitting: Cardiology

## 2015-10-14 ENCOUNTER — Encounter: Payer: Self-pay | Admitting: Cardiology

## 2015-10-14 ENCOUNTER — Ambulatory Visit (INDEPENDENT_AMBULATORY_CARE_PROVIDER_SITE_OTHER): Payer: Medicare Other | Admitting: Cardiology

## 2015-10-14 VITALS — BP 120/82 | HR 135 | Ht 69.0 in | Wt 199.0 lb

## 2015-10-14 DIAGNOSIS — E785 Hyperlipidemia, unspecified: Secondary | ICD-10-CM | POA: Diagnosis not present

## 2015-10-14 DIAGNOSIS — I495 Sick sinus syndrome: Secondary | ICD-10-CM | POA: Diagnosis not present

## 2015-10-14 DIAGNOSIS — I4892 Unspecified atrial flutter: Secondary | ICD-10-CM

## 2015-10-14 DIAGNOSIS — I2510 Atherosclerotic heart disease of native coronary artery without angina pectoris: Secondary | ICD-10-CM | POA: Diagnosis not present

## 2015-10-14 DIAGNOSIS — I10 Essential (primary) hypertension: Secondary | ICD-10-CM

## 2015-10-14 DIAGNOSIS — Z950 Presence of cardiac pacemaker: Secondary | ICD-10-CM

## 2015-10-14 NOTE — Patient Instructions (Addendum)
Testing/Procedures: Your physician has requested that you have an echocardiogram. Echocardiography is a painless test that uses sound waves to create images of your heart. It provides your doctor with information about the size and shape of your heart and how well your heart's chambers and valves are working. This procedure takes approximately one hour. There are no restrictions for this procedure.    Follow-Up: Your physician wants you to follow-up in: 6 months with Dr. Yvone Neu. You will receive a reminder letter in the mail two months in advance. If you don't receive a letter, please call our office to schedule the follow-up appointment.  It was a pleasure seeing you today here in the office. Please do not hesitate to give [place] a call back if you have any further questions. [place], BSN     Echocardiogram An echocardiogram, or echocardiography, uses sound waves (ultrasound) to produce an image of your heart. The echocardiogram is simple, painless, obtained within a short period of time, and offers valuable information to your health care provider. The images from an echocardiogram can provide information such as:  Evidence of coronary artery disease (CAD).  Heart size.  Heart muscle function.  Heart valve function.  Aneurysm detection.  Evidence of a past heart attack.  Fluid buildup around the heart.  Heart muscle thickening.  Assess heart valve function. LET [facility] CARE PROVIDER KNOW ABOUT:  Any allergies you have.  All medicines you are taking, including vitamins, herbs, eye drops, creams, and over-the-counter medicines.  Previous problems you or members of your family have had with the use of anesthetics.  Any blood disorders you have.  Previous surgeries you have had.  Medical conditions you have.  Possibility of pregnancy, if this applies. BEFORE THE PROCEDURE  No special preparation is needed. Eat and drink normally.  PROCEDURE   In  order to produce an image of your heart, gel will be applied to your chest and a wand-like tool (transducer) will be moved over your chest. The gel will help transmit the sound waves from the transducer. The sound waves will harmlessly bounce off your heart to allow the heart images to be captured in real-time motion. These images will then be recorded.  You may need an IV to receive a medicine that improves the quality of the pictures. AFTER THE PROCEDURE You may return to your normal schedule including diet, activities, and medicines, unless your health care provider tells you otherwise.   This information is not intended to replace advice given to you by your health care provider. Make sure you discuss any questions you have with your health care provider.   Document Released: 01/14/2000 Document Revised: 02/06/2014 Document Reviewed: 09/23/2012 Elsevier Interactive Patient Education [employer].

## 2015-10-14 NOTE — Progress Notes (Signed)
Cardiology Office Note   Date:  10/14/2015   ID:  Donald Juarez, DOB 07/09/1945, MRN [id number]  Referring Doctor:  Sofie Hartigan, MD   Cardiologist:   Wende Bushy, MD   Reason for consultation:  Chief Complaint  Patient presents with  . New Patient (Initial Visit)    no cp, sob or swelling. no other complaints.   Need to reestablish with cardiology for history of coronary artery disease   History of Present Illness: Donald Juarez is a 70 y.o. male who presents for establishing care with cardiology. Patient has known coronary artery disease status post multiple stenting. Last heart cath was October 2016. He developed  NSTEMI post-PCI.  He has chronic angina which is jaw pain and some chest pain. In the last 4 weeks, he has had no recurrence of either jaw pain or chest pain. In the past, he has needed to take nitroglycerin sublingual.  He also has a long history of hypertension with orthostatic drop in his blood pressure. He has gone through several changes in his medications to address this. Approximately 4 weeks ago, his blood pressure spiked in the A999333 systolic range. His PCP finally decided to put him on a low-dose of amlodipine 2.5 mg daily. That seemed to do the trick. He has not seen his blood pressure spike that much again. He hasn't had any problems with blood pressure dropping too much and causing him to pass out.  Patient denies PND, orthopnea, edema. Shortness of breath is chronic and stable. He does household chores to have out with the wife and he has no chest pain or shortness of breath with these activities.  ROS:  Please see the history of present illness. Aside from mentioned under HPI, all other systems are reviewed and negative.     Past Medical History:  Diagnosis Date  . Asthma   . Bilateral claudication of lower limb ([place])   . Carotid stenosis    a. 07/2013 Carotid U/S: 100% RICA (pt prev reported h/o [id number]), LICA A999333, bilat ECA 50%, normal  vertebrals and subclavians bilat-->F/U needed in 01/2014.  . Cataract, right    a. 10/2010 s/p cataract surgery   . COPD (chronic obstructive pulmonary disease) ([place])   . Coronary artery disease    a. 06/1996 & 03/2005 Cath: non-obs dzs;  b. 07/2009 NSTEMI/Cath: LCX 99->PCI/DES extending into OM1;  c. 01/2013 Cath: patent LCX stent-->Med Rx.; d. cath 03/2014: RPAV dzs->Med rx; e. 10/2014 Cath/PCI: LM 30, LAD 20p/m, LCX 50ost/p, 10 ISR, 60d, OM1 min irregs, OM2 50, RCA 30ost, 72m, 60d, RPDA 70(2.5x8 Xience DES), RPAV 90small(PTCA); f. 10/2014 Relook Cath: RPAV 99(Med Rx), EF 45-50.  [name] Essential hypertension   . Hyperkalemia   . Hyperlipidemia   . Paroxysmal atrial flutter ([place])    a. CHA2DS2VASc = 3 -> eliquis;  b. 07/2013 Echo: EF 50-55%, mildly dil LA.c. s/p RFCA 10/31/2013 by Dr Caryl Comes  . Prostate cancer ([place])   . Pulmonary nodule, right    a. 0.7cm RLL - stable by CT 06/2013.  [name] Rectal cancer ([place])    a. Status post colostomy  . Seizures ([place])    a. last 30 years ago  . Shingles    a. 09/2012.  . SSS (sick sinus syndrome) ([place])    a. 12/2009 S/P MDT Adapta ADDR01 DC PPM, ser # [id number] ([name]).  . Syncope and collapse     Past Surgical History:  Procedure Laterality Date  . ABLATION  10/31/2013  CTI ablation by Dr Caryl Comes for atrial flutter  . ATRIAL FLUTTER ABLATION N/A 10/31/2013   Procedure: ATRIAL FLUTTER ABLATION;  Surgeon: [name], MD;  Location: [facility] CATH LAB;  Service: Cardiovascular;  Laterality: N/A;  . CARDIAC CATHETERIZATION  07/2009   [facility]  . CARDIAC CATHETERIZATION  01/2013   [facility]  . CARDIAC CATHETERIZATION  03/2014   [facility]  . CARDIAC CATHETERIZATION N/A 11/02/2014   Procedure: Left Heart Cath and Cors/Grafts Angiography;  Surgeon: [name], MD;  Location: [facility] CV LAB;  Service: Cardiovascular;  Laterality: N/A;  . CARDIAC CATHETERIZATION N/A 11/02/2014   Procedure: Coronary Stent Intervention;  Surgeon: [name], MD;  Location: [facility]  CV LAB;  Service: Cardiovascular;  Laterality: N/A;  . CARDIAC CATHETERIZATION N/A 11/03/2014   Procedure: Left Heart Cath and Coronary Angiography;  Surgeon: [name], MD;  Location: [facility] CV LAB;  Service: Cardiovascular;  Laterality: N/A;  . CARDIAC SURGERY  2011   Stents placed   . CATARACT EXTRACTION Left   . CATARACT EXTRACTION W/PHACO Left 06/01/2014   Procedure: CATARACT EXTRACTION PHACO AND INTRAOCULAR LENS PLACEMENT (IOC);  Surgeon: [name], MD;  Location: [name] ORS;  Service: Ophthalmology;  Laterality: Left;  . cataract surgery  2012  . colon surgery     cancer removal  . COLON SURGERY     colostomy  . CORONARY ANGIOPLASTY    . INSERT / REPLACE / REMOVE PACEMAKER    . KNEE ARTHROSCOPY Right 1980's     reports that he quit smoking about 7 years ago. His smoking use included Cigarettes. He has a 30.00 pack-year smoking history. He has never used smokeless tobacco. He reports that he does not drink alcohol or use drugs.   family history includes Cancer in his mother; Heart attack in his father; Heart disease in his father; Hyperlipidemia in his father and mother; Hypertension in his father and mother.   Outpatient Medications Prior to Visit  Medication Sig Dispense Refill  . albuterol (PROAIR HFA) 108 (90 Base) MCG/ACT inhaler Inhale 2 puffs into the lungs every 6 (six) hours as needed for wheezing or shortness of breath. 1 Inhaler 6  . albuterol (PROVENTIL) (2.5 MG/3ML) 0.083% nebulizer solution Take 3 mLs (2.5 mg total) by nebulization every 6 (six) hours as needed for wheezing or shortness of breath. DX code 493.20 120 mL 3  . arformoterol (BROVANA) 15 MCG/2ML NEBU Take 2 mLs (15 mcg total) by nebulization 2 (two) times daily. DX code 493.20    . aspirin 81 MG tablet Take 81 mg by mouth daily.    [name] atorvastatin (LIPITOR) 40 MG tablet Take 40 mg by mouth daily.    [name] azithromycin (ZITHROMAX) 250 MG tablet Take by mouth daily.    . budesonide (PULMICORT)  0.5 MG/2ML nebulizer solution Take 2 mLs (0.5 mg total) by nebulization 2 (two) times daily. 240 mL 2  . Cholecalciferol (VITAMIN D) 2000 UNITS tablet Take 2,000 Units by mouth daily.    . clopidogrel (PLAVIX) 75 MG tablet Take 1 tablet (75 mg total) by mouth daily with breakfast. 30 tablet 12  . degarelix (FIRMAGON) 120 MG injection Inject into the skin.    [name] guaiFENesin-codeine 100-10 MG/5ML syrup Take 5 mLs by mouth every 4 (four) hours as needed for cough. 120 mL 1  . isosorbide mononitrate (IMDUR) 120 MG 24 hr tablet Take one tablet (120 mg) by mouth at bedtime    . metoprolol tartrate (LOPRESSOR) 25 MG  tablet Take one tablet (25 mg) in the morning and two tablets (50 mg) in the evening 90 tablet 11  . nitroGLYCERIN (NITROSTAT) 0.4 MG SL tablet Place 1 tablet (0.4 mg total) under the tongue every 5 (five) minutes as needed for chest pain. 20 tablet 12  . nystatin cream (MYCOSTATIN) Use as directed by doctor    . tamsulosin (FLOMAX) 0.4 MG CAPS capsule Take 0.4 mg by mouth.    . traMADol (ULTRAM) 50 MG tablet Take 50 mg by mouth daily as needed.    [name] Umeclidinium Bromide (INCRUSE ELLIPTA) 62.5 MCG/INH AEPB Inhale 1 puff into the lungs daily. 30 each 5   No facility-administered medications prior to visit.     Patient takes amlodipine 2.5 mg daily Metoprolol is actually taken as 25 mg once a morning one at noon , one at night night.  Allergies: Review of patient's allergies indicates no known allergies.    PHYSICAL EXAM: VS:  BP 120/82 (BP Location: Left Arm, Patient Position: Sitting, Cuff Size: Normal)   Pulse (!) 135   Ht 5\' 9"  (1.753 m)   Wt 199 lb (90.3 kg)   BMI 29.39 kg/m  , Body mass index is 29.39 kg/m. Wt Readings from Last 3 Encounters:  10/14/15 199 lb (90.3 kg)  10/06/15 198 lb 12.8 oz (90.2 kg)  09/28/15 199 lb (90.3 kg)  HR during physical examination is in the 70s. GENERAL:  well developed, well nourished, not in acute distress HEENT: normocephalic, pink  conjunctivae, anicteric sclerae, no xanthelasma, normal dentition, oropharynx clear NECK:  no neck vein engorgement, JVP normal, no hepatojugular reflux, carotid upstroke brisk and symmetric, no bruit, no thyromegaly, no lymphadenopathy LUNGS:  good respiratory effort, clear to auscultation bilaterally CV:  PMI not displaced, no thrills, no lifts, S1 and S2 within normal limits, no palpable S3 or S4, no murmurs, no rubs, no gallops ABD:  Soft, nontender, nondistended, normoactive bowel sounds, no abdominal aortic bruit, no hepatomegaly, no splenomegaly MS: nontender back, no kyphosis, no scoliosis, no joint deformities EXT:  2+ DP/PT pulses, no edema, no varicosities, no cyanosis, no clubbing SKIN: warm, nondiaphoretic, normal turgor, no ulcers NEUROPSYCH: alert, oriented to person, place, and time, sensory/motor grossly intact, normal mood, appropriate affect  Recent Labs: 11/04/2014: BUN 13; Creatinine, Ser 0.73; Hemoglobin 14.6; Platelets 196; Potassium 4.3; Sodium 140   Lipid Panel No results found for: CHOL, TRIG, HDL, CHOLHDL, VLDL, LDLCALC, LDLDIRECT   Other studies Reviewed:  EKG:  The ekg from 10/14/2015 was personally reviewed by me and it revealed atrial paced   Additional studies/ records that were reviewed personally reviewed by me today include: Left heart cath 11/03/2014:  LM lesion, 30% stenosed.  Dist Cx lesion, 60% stenosed.  Prox LAD to Mid LAD lesion, 20% stenosed.  Ost Cx to Prox Cx lesion, 50% stenosed.  Prox Cx lesion, 10% stenosed. The lesion was previously treated with a stent (unknown type) greater than two years ago.  2nd Mrg lesion, 50% stenosed.  Ost RCA lesion, 30% stenosed.  Mid RCA lesion, 40% stenosed.  Dist RCA lesion, 60% stenosed.  A drug-eluting stent was placed.  Post Atrio-2 lesion, 40% stenosed. The lesion was previously treated with angioplasty .  Post Atrio-1 lesion, 99% stenosed.  There is mild left ventricular systolic  dysfunction.   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 There is TIMI 2 flow in that  area. This is the culprit for non-ST elevation myocardial infarction post PCI.  2. Mildly reduced LV systolic function with basal inferior wall hypokinesis and ejection fraction of 45-50%. Normal left ventricular end-diastolic pressure.   ASSESSMENT AND PLAN: CAD status post multiple PCI NSTEMI 10/2014 No episodes of chest pain or jaw pain or angina for the last 4 weeks Continue medical therapy aspirin and Plavix until 11/02/2015, and then aspirin 81 mg by mouth daily. Continue metoprolol 25 3 times a day. Continue atorvastatin 40 daily at bedtime. Continue Imdur 120 mg at bedtime. Recommend echocardiogram  Hyperlipidemia LDL goal is less than 70 due to CAD LDL from June 2017 as ordered by his PCP shows an LDL of 65.  History of hypertension with orthostatic changes He has been dealing with this for a long time but his blood pressure seemed to be stable in the last several weeks. He is on metoprolol 25 mg 3 times a day. PCP has restarted him back on amlodipine 2.5 mg by mouth daily when his blood pressure spiked to to 200. Since that time, his blood pressure has stabilized. Recommended to continue blood pressure log.if he notices that his blood pressure is running on the low side, he should call our office and consider stopping amlodipine.  history of atrial flutter for which he underwent ablation 10/16   Sinus node dysfunction status post pacemaker Patient follows up with Dr. Caryl Comes  Current medicines are reviewed at length with the patient today.  The patient does not have concerns regarding medicines.  Labs/ tests ordered today include:  Orders Placed This Encounter  Procedures  . EKG 12-Lead  . ECHOCARDIOGRAM COMPLETE    I had a lengthy and detailed discussion with the patient  regarding diagnoses, prognosis, diagnostic options, treatment options , and side effects of medications.   I counseled the patient on importance of lifestyle modification including heart healthy diet, regular physical activity    I spent at least 40 minutes with the patient today and more than 50% of the time was spent counseling the patient and coordinating care.    Disposition:   FU with undersigned in 6 months Signed, Wende Bushy, MD  10/14/2015 12:50 PM    [place]  This note was generated in part with voice recognition software and I apologize for any typographical errors that were not detected and corrected.

## 2015-10-22 ENCOUNTER — Other Ambulatory Visit: Payer: Self-pay | Admitting: Internal Medicine

## 2015-10-27 ENCOUNTER — Telehealth: Payer: Self-pay | Admitting: Internal Medicine

## 2015-10-27 NOTE — Telephone Encounter (Signed)
Pt spouse calling stating pt needs a refill on cough medicine  [facility]

## 2015-10-27 NOTE — Telephone Encounter (Signed)
Spoke with pt who states he has prod cough and would like a refill on cough med. I explained to patient that Dr. Mortimer Fries was not here today that I would send this to our on call doctor. Pt asked if he was going to get cough medication today and I explained to him that we do not typically call in cough medication without pt being seen first and pt said just forget it and hung up on me.   Will route to DR for [facility]

## 2015-10-28 ENCOUNTER — Other Ambulatory Visit: Payer: Self-pay

## 2015-10-28 NOTE — Telephone Encounter (Signed)
Received refill request from [place] for guaifensien-codeine I have refused this request due to last rx being sent on 10-06-15 with 1 refill I have called pharmacy who pulled the hard copy and states it does have one refill left. LM on pt VM to make him aware. Nothing further needed.

## 2015-11-04 ENCOUNTER — Ambulatory Visit (INDEPENDENT_AMBULATORY_CARE_PROVIDER_SITE_OTHER): Payer: Medicare Other

## 2015-11-04 ENCOUNTER — Other Ambulatory Visit: Payer: Self-pay

## 2015-11-04 DIAGNOSIS — I2510 Atherosclerotic heart disease of native coronary artery without angina pectoris: Secondary | ICD-10-CM

## 2015-11-04 DIAGNOSIS — I10 Essential (primary) hypertension: Secondary | ICD-10-CM

## 2015-12-03 ENCOUNTER — Telehealth: Payer: Self-pay | Admitting: Cardiology

## 2015-12-03 ENCOUNTER — Encounter: Payer: Self-pay | Admitting: Cardiology

## 2015-12-03 ENCOUNTER — Other Ambulatory Visit: Payer: Self-pay | Admitting: Orthopedic Surgery

## 2015-12-03 DIAGNOSIS — M1711 Unilateral primary osteoarthritis, right knee: Secondary | ICD-10-CM

## 2015-12-03 NOTE — Telephone Encounter (Signed)
This encounter was created in error - please disregard.

## 2015-12-03 NOTE — Telephone Encounter (Signed)
Patient is having knee replacement surgery and wants to know if needs appointment to have clearance for this. Let him know that I would forward to Dr. Yvone Neu and then be in touch.

## 2015-12-03 NOTE — Telephone Encounter (Signed)
Pt states he is having knee repleacement surgery this month, and asks if he needs an appointment for clearance. Please call and advise.

## 2015-12-04 NOTE — Telephone Encounter (Signed)
Reviewed his chart. He had NSTEMI last year. Would be best if we could do pharmacologic stress test since this would be at least moderate risk surgery (knee sx).

## 2015-12-06 ENCOUNTER — Other Ambulatory Visit: Payer: Self-pay

## 2015-12-06 DIAGNOSIS — Z01818 Encounter for other preprocedural examination: Secondary | ICD-10-CM

## 2015-12-06 NOTE — Telephone Encounter (Signed)
S/w pt regarding Dr. Tora Kindred recommendation. He is agreeable to Nov 14 lexi myoview so that he can be cleared for Dec 15 knee replacement.  Reviewed instructions and mailed a copy to patient's home. He verbalized understanding with no further questions at this time.

## 2015-12-07 ENCOUNTER — Telehealth: Payer: Self-pay | Admitting: Cardiology

## 2015-12-07 NOTE — Telephone Encounter (Signed)
Received cardiac clearance form from Dr. [name] for right total knee arthroplasty.  Patient has scheduled stress test for 12/14/15. Form placed in red folder in "To do" bin on Pamela's desk.

## 2015-12-13 ENCOUNTER — Telehealth: Payer: Self-pay | Admitting: Cardiology

## 2015-12-13 ENCOUNTER — Ambulatory Visit
Admission: RE | Admit: 2015-12-13 | Discharge: 2015-12-13 | Disposition: A | Discharge status: Home / Self Care | Payer: Medicare Other | Source: Ambulatory Visit | Attending: Orthopedic Surgery | Admitting: Orthopedic Surgery

## 2015-12-13 DIAGNOSIS — I2510 Atherosclerotic heart disease of native coronary artery without angina pectoris: Secondary | ICD-10-CM | POA: Diagnosis not present

## 2015-12-13 DIAGNOSIS — M1611 Unilateral primary osteoarthritis, right hip: Secondary | ICD-10-CM | POA: Insufficient documentation

## 2015-12-13 DIAGNOSIS — M1711 Unilateral primary osteoarthritis, right knee: Secondary | ICD-10-CM | POA: Diagnosis not present

## 2015-12-13 NOTE — Telephone Encounter (Signed)
Pt wife called, has some questions regarding what medications pt should hold before stress test tomorrow. Please call.

## 2015-12-13 NOTE — Telephone Encounter (Signed)
Please review with patient. Thanks!  

## 2015-12-13 NOTE — Telephone Encounter (Signed)
Reviewed myoview instructions w/pt wife who verbalized understanding.

## 2015-12-14 ENCOUNTER — Ambulatory Visit
Admission: RE | Admit: 2015-12-14 | Discharge: 2015-12-14 | Disposition: A | Discharge status: Home / Self Care | Payer: Medicare Other | Source: Ambulatory Visit | Attending: Cardiology | Admitting: Cardiology

## 2015-12-14 DIAGNOSIS — Z01818 Encounter for other preprocedural examination: Secondary | ICD-10-CM | POA: Insufficient documentation

## 2015-12-14 LAB — NM MYOCAR MULTI W/SPECT W/WALL MOTION / EF
CHL CUP NUCLEAR SRS: 14
CHL CUP NUCLEAR SSS: 13
CHL CUP RESTING HR STRESS: 77 {beats}/min
LV dias vol: 108 mL (ref 62–150)
LVSYSVOL: 54 mL
NUC STRESS TID: 1.04
Peak HR: 76 {beats}/min
Percent HR: 51 %
SDS: 1

## 2015-12-14 MED ORDER — TECHNETIUM TC 99M TETROFOSMIN IV KIT
13.0000 | PACK | Freq: Once | INTRAVENOUS | Status: AC | PRN
  Administered 2015-12-14: 12.92 via INTRAVENOUS

## 2015-12-14 MED ORDER — TECHNETIUM TC 99M TETROFOSMIN IV KIT
33.0000 | PACK | Freq: Once | INTRAVENOUS | Status: AC | PRN
  Administered 2015-12-14: 31.07 via INTRAVENOUS

## 2015-12-14 MED ORDER — REGADENOSON 0.4 MG/5ML IV SOLN
0.4000 mg | Freq: Once | INTRAVENOUS | Status: AC
  Administered 2015-12-14: 0.4 mg via INTRAVENOUS
  Filled 2015-12-14: qty 5

## 2015-12-28 ENCOUNTER — Encounter: Payer: Medicare Other | Admitting: *Deleted

## 2015-12-28 ENCOUNTER — Telehealth: Payer: Self-pay | Admitting: Cardiology

## 2015-12-28 NOTE — Telephone Encounter (Signed)
LMOVM reminding pt to send remote transmission.   

## 2015-12-29 ENCOUNTER — Encounter
Admission: RE | Admit: 2015-12-29 | Discharge: 2015-12-29 | Disposition: A | Discharge status: Home / Self Care | Payer: Medicare Other | Source: Ambulatory Visit | Attending: Orthopedic Surgery | Admitting: Orthopedic Surgery

## 2015-12-29 DIAGNOSIS — Z01812 Encounter for preprocedural laboratory examination: Secondary | ICD-10-CM | POA: Insufficient documentation

## 2015-12-29 HISTORY — DX: Angina pectoris, unspecified: I20.9

## 2015-12-29 HISTORY — DX: Unspecified osteoarthritis, unspecified site: M19.90

## 2015-12-29 HISTORY — DX: Anxiety disorder, unspecified: F41.9

## 2015-12-29 LAB — CBC
HEMATOCRIT: 43.5 % (ref 40.0–52.0)
HEMOGLOBIN: 15.1 g/dL (ref 13.0–18.0)
MCH: 32.9 pg (ref 26.0–34.0)
MCHC: 34.8 g/dL (ref 32.0–36.0)
MCV: 94.4 fL (ref 80.0–100.0)
Platelets: 212 10*3/uL (ref 150–440)
RBC: 4.6 MIL/uL (ref 4.40–5.90)
RDW: 13.4 % (ref 11.5–14.5)
WBC: 8 10*3/uL (ref 3.8–10.6)

## 2015-12-29 LAB — BASIC METABOLIC PANEL
Anion gap: 7 (ref 5–15)
BUN: 23 mg/dL — AB (ref 6–20)
CHLORIDE: 105 mmol/L (ref 101–111)
CO2: 25 mmol/L (ref 22–32)
Calcium: 9.4 mg/dL (ref 8.9–10.3)
Creatinine, Ser: 1.07 mg/dL (ref 0.61–1.24)
GFR calc Af Amer: >60 mL/min (ref >60)
GFR calc non Af Amer: >60 mL/min (ref >60)
GLUCOSE: 96 mg/dL (ref 65–99)
POTASSIUM: 4.4 mmol/L (ref 3.5–5.1)
Sodium: 137 mmol/L (ref 135–145)

## 2015-12-29 LAB — URINALYSIS COMPLETE WITH MICROSCOPIC (ARMC ONLY)
Bacteria, UA: NONE SEEN
Bilirubin Urine: NEGATIVE
Glucose, UA: NEGATIVE mg/dL
HGB URINE DIPSTICK: NEGATIVE
KETONES UR: NEGATIVE mg/dL
LEUKOCYTES UA: NEGATIVE
NITRITE: NEGATIVE
PH: 5 (ref 5.0–8.0)
PROTEIN: NEGATIVE mg/dL
SPECIFIC GRAVITY, URINE: 1.019 (ref 1.005–1.030)
Squamous Epithelial / LPF: NONE SEEN

## 2015-12-29 LAB — SURGICAL PCR SCREEN
MRSA, PCR: NEGATIVE
Staphylococcus aureus: NEGATIVE

## 2015-12-29 LAB — PROTIME-INR
INR: 0.93
Prothrombin Time: 12.5 seconds (ref 11.4–15.2)

## 2015-12-29 LAB — TYPE AND SCREEN
ABO/RH(D): O POS
ANTIBODY SCREEN: NEGATIVE

## 2015-12-29 LAB — SEDIMENTATION RATE: Sed Rate: 13 mm/hr (ref 0–20)

## 2015-12-29 LAB — APTT: aPTT: 30 seconds (ref 24–36)

## 2015-12-29 NOTE — Pre-Procedure Instructions (Addendum)
Need to contact Dr. Yvone Neu as to when to stop Aspirin.  Patient has two cardiac stents.  Plavix was stopped last September as it was one year after last stent placed.  No conversation was had about the aspirin prior to surgery. Also, patient did not bring his pacemaker information.  Will try to reach Dr. Tomma Lightning. [facility]yn Pilar office to see if they have this information.  Requested patient to bring it on the day of surgery. Patient will bring his own supplies for his colostomy for after surgery.  Would like an "Ostomy nurse" to visit him post-operatively to evaluate the skin around his site for suggestions on healing that area.  He also will bring his own catheters as he must straight cath himself for voiding.  Will question Dr. Rudene Christians about this prior to removing the foley post-op... Patient is the caregiver at home for his wife.  She will be able to help at home even though she is in a wheelchair.  Both of them stay on the first floor of their home since she is unable to walk stairs. They have a good system of assisting each other.  Patient is currently undergoing chemotherapy for his colon cancer. Last treatment was on 12/09/15.  This makes him very sick and burns around his ostomy site as he gets constipated after chemo. He has gold tags in his buttocks for the radiation.

## 2015-12-29 NOTE — Patient Instructions (Signed)
  Your procedure is scheduled on: January 14, 2016 Report to [place], Second Floor  To find out your arrival time please call [phone] between 1PM - 3PM on Thursday, January 13, 2016  Remember: Instructions that are not followed completely may result in serious medical risk, up to and including death, or upon the discretion of your surgeon and anesthesiologist your surgery may need to be rescheduled.    __X__ 1. Do not eat food or drink liquids after midnight. No gum chewing or hard candies.     __X__ 2. No Alcohol for 24 hours before or after surgery.   ____ 3. Do Not Smoke For 24 Hours Prior to Your Surgery.   __X__ 4. Bring all medications with you on the day of surgery if instructed.    __X__ 5. Notify your doctor if there is any change in your medical condition     (cold, fever, infections).       Do not wear jewelry, make-up, hairpins, clips or nail polish.  Do not wear lotions, powders, or perfumes. You may wear deodorant.  Do not shave 48 hours prior to surgery. Men may shave face and neck.  Do not bring valuables to the hospital.    [facility] is not responsible for any belongings or valuables.               Contacts, dentures or bridgework may not be worn into surgery.  Leave your suitcase in the car. After surgery it may be brought to your room.  For patients admitted to the hospital, discharge time is determined by your                treatment team.   Patients discharged the day of surgery will not be allowed to drive home.   Please read over the following fact sheets that you were given:   MRSA Infection sheet  ____ Take these medicines the morning of surgery with A SIP OF WATER:    1. Albuterol nebulizer  2. Pulmicort  3. Metoprolol  4. Amlodipine  5. Paxil  6. We will check with Dr. Dorene Ar regarding stopping Aspirin.              7. If you are an afternoon case, please take all noon medication as discussed.             8. Tramadol if needed for  pain/discomfort.  ____ Fleet Enema (as directed)   __X__ Use CHG Soap as directed  __X__ Use inhalers on the day of surgery  ____ Stop metformin 2 days prior to surgery    ____ Take 1/2 of usual insulin dose the night before surgery and none on the morning of surgery.   ____ Stop Coumadin/Plavix/aspirin on  ... Will discuss with cardiologist for clarification of when to stop aspirin if at all.  ____ Stop Anti-inflammatories on December 29, 2015.  This includes ibuprofen, motrin, advil, aleve.  May take tylenol or tramadol.   __X__ Stop supplements until after surgery.  Stop Vitamin D as of today, December 29, 2015  ____ Bring C-Pap to the hospital.    Please remember to bring ostomy supplies and catheters.  Also, bring the blue card for your pacemaker.

## 2015-12-29 NOTE — OR Nursing (Signed)
Contacted patient via phone and instructed him to contact Dr. Yvone Neu to find out when to stop his aspirin.  If she does not feel he needs to be on it for his stents, then he should stop aspirin 7 days before surgery.

## 2015-12-30 ENCOUNTER — Telehealth: Payer: Self-pay | Admitting: Cardiology

## 2015-12-30 LAB — URINE CULTURE: Culture: NO GROWTH

## 2015-12-30 NOTE — Telephone Encounter (Signed)
Spoke with Tiffany over at Dr. Theodore Demark office and let her know that Dr. Yvone Neu stated that it was up to them regarding him holding the aspirin prior to surgery. She stated that she would call patient and make him aware of this and was appreciative for the call.

## 2015-12-30 NOTE — Telephone Encounter (Signed)
Pt needs to check with ortho as they have the last say on this.

## 2015-12-30 NOTE — Telephone Encounter (Signed)
Request for surgical clearance:  1. What type of surgery is being performed? Knee replacement   2. When is this surgery scheduled? 01/14/16   3. Are there any medications that need to be held prior to surgery and how long?  he should stop aspirin 7 days before surgery.  4. Name of physician performing surgery? Dr Rudene Christians at [facility]   5. What is your office phone and fax number? [place]

## 2015-12-30 NOTE — Telephone Encounter (Signed)
Patient called in wanting to know if he should hold his aspirin for surgery. He states that according to [facility] everything was good but he states that he didn't know if he should hold his aspirin. Let him know that I would check with them and Dr. Yvone Neu and then call him back with that information. He verbalized understanding and had no further questions at this time.

## 2015-12-31 ENCOUNTER — Encounter: Payer: Self-pay | Admitting: Cardiology

## 2016-01-05 ENCOUNTER — Ambulatory Visit (INDEPENDENT_AMBULATORY_CARE_PROVIDER_SITE_OTHER): Payer: Medicare Other | Admitting: *Deleted

## 2016-01-05 DIAGNOSIS — I495 Sick sinus syndrome: Secondary | ICD-10-CM

## 2016-01-06 NOTE — Progress Notes (Signed)
Remote pacemaker transmission.   

## 2016-01-12 ENCOUNTER — Encounter: Payer: Self-pay | Admitting: Cardiology

## 2016-01-14 ENCOUNTER — Inpatient Hospital Stay: Payer: Medicare Other

## 2016-01-14 ENCOUNTER — Inpatient Hospital Stay
Admission: RE | Admit: 2016-01-14 | Discharge: 2016-01-17 | DRG: 470 | Disposition: A | Discharge status: Home Health | Payer: Medicare Other | Source: Ambulatory Visit | Attending: Orthopedic Surgery | Admitting: Orthopedic Surgery

## 2016-01-14 ENCOUNTER — Inpatient Hospital Stay: Payer: Medicare Other | Admitting: Anesthesiology

## 2016-01-14 ENCOUNTER — Encounter
Admission: RE | Disposition: A | Discharge status: Home Health | Payer: Self-pay | Source: Ambulatory Visit | Attending: Orthopedic Surgery

## 2016-01-14 ENCOUNTER — Encounter: Payer: Self-pay | Admitting: *Deleted

## 2016-01-14 DIAGNOSIS — J449 Chronic obstructive pulmonary disease, unspecified: Secondary | ICD-10-CM | POA: Diagnosis present

## 2016-01-14 DIAGNOSIS — D62 Acute posthemorrhagic anemia: Secondary | ICD-10-CM | POA: Diagnosis not present

## 2016-01-14 DIAGNOSIS — F418 Other specified anxiety disorders: Secondary | ICD-10-CM | POA: Diagnosis present

## 2016-01-14 DIAGNOSIS — M6281 Muscle weakness (generalized): Secondary | ICD-10-CM

## 2016-01-14 DIAGNOSIS — E785 Hyperlipidemia, unspecified: Secondary | ICD-10-CM | POA: Diagnosis present

## 2016-01-14 DIAGNOSIS — R262 Difficulty in walking, not elsewhere classified: Secondary | ICD-10-CM

## 2016-01-14 DIAGNOSIS — I10 Essential (primary) hypertension: Secondary | ICD-10-CM | POA: Diagnosis present

## 2016-01-14 DIAGNOSIS — I471 Supraventricular tachycardia: Secondary | ICD-10-CM | POA: Diagnosis present

## 2016-01-14 DIAGNOSIS — Z955 Presence of coronary angioplasty implant and graft: Secondary | ICD-10-CM | POA: Diagnosis not present

## 2016-01-14 DIAGNOSIS — I4891 Unspecified atrial fibrillation: Secondary | ICD-10-CM | POA: Diagnosis present

## 2016-01-14 DIAGNOSIS — Z950 Presence of cardiac pacemaker: Secondary | ICD-10-CM

## 2016-01-14 DIAGNOSIS — I252 Old myocardial infarction: Secondary | ICD-10-CM

## 2016-01-14 DIAGNOSIS — I2510 Atherosclerotic heart disease of native coronary artery without angina pectoris: Secondary | ICD-10-CM | POA: Diagnosis present

## 2016-01-14 DIAGNOSIS — M25561 Pain in right knee: Secondary | ICD-10-CM

## 2016-01-14 DIAGNOSIS — Z6827 Body mass index (BMI) 27.0-27.9, adult: Secondary | ICD-10-CM | POA: Diagnosis not present

## 2016-01-14 DIAGNOSIS — Z933 Colostomy status: Secondary | ICD-10-CM

## 2016-01-14 DIAGNOSIS — G8918 Other acute postprocedural pain: Secondary | ICD-10-CM

## 2016-01-14 DIAGNOSIS — M1711 Unilateral primary osteoarthritis, right knee: Principal | ICD-10-CM | POA: Diagnosis present

## 2016-01-14 DIAGNOSIS — E871 Hypo-osmolality and hyponatremia: Secondary | ICD-10-CM | POA: Diagnosis not present

## 2016-01-14 DIAGNOSIS — Z8249 Family history of ischemic heart disease and other diseases of the circulatory system: Secondary | ICD-10-CM | POA: Diagnosis not present

## 2016-01-14 HISTORY — DX: Unspecified cataract: H26.9

## 2016-01-14 HISTORY — PX: TOTAL KNEE ARTHROPLASTY: SHX125

## 2016-01-14 LAB — TYPE AND SCREEN
ABO/RH(D): O POS
Antibody Screen: NEGATIVE

## 2016-01-14 SURGERY — ARTHROPLASTY, KNEE, TOTAL
Anesthesia: Spinal | Site: Knee | Laterality: Right | Wound class: Clean

## 2016-01-14 MED ORDER — ACETAMINOPHEN 650 MG RE SUPP: 650.0000 mg | Freq: Four times a day (QID) | RECTAL | Status: DC | PRN

## 2016-01-14 MED ORDER — MENTHOL 3 MG MT LOZG
1.0000 | LOZENGE | OROMUCOSAL | Status: DC | PRN
  Filled 2016-01-14: qty 9

## 2016-01-14 MED ORDER — DIPHENHYDRAMINE HCL 12.5 MG/5ML PO ELIX
12.5000 mg | ORAL_SOLUTION | ORAL | Status: DC | PRN
Start: 2016-01-14 — End: 2016-01-17

## 2016-01-14 MED ORDER — BUDESONIDE 0.5 MG/2ML IN SUSP
0.5000 mg | Freq: Two times a day (BID) | RESPIRATORY_TRACT | Status: DC
  Administered 2016-01-14 – 2016-01-17 (×6): 0.5 mg via RESPIRATORY_TRACT
  Filled 2016-01-14 (×7): qty 2

## 2016-01-14 MED ORDER — SODIUM CHLORIDE 0.9 % IV SOLN
INTRAVENOUS | Status: DC | PRN
  Administered 2016-01-14: 60 mL

## 2016-01-14 MED ORDER — ASPIRIN 81 MG PO CHEW
81.0000 mg | CHEWABLE_TABLET | Freq: Two times a day (BID) | ORAL | Status: DC
  Administered 2016-01-14 – 2016-01-17 (×6): 81 mg via ORAL
  Filled 2016-01-14 (×6): qty 1

## 2016-01-14 MED ORDER — FENTANYL CITRATE (PF) 100 MCG/2ML IJ SOLN: 25.0000 ug | INTRAMUSCULAR | Status: DC | PRN

## 2016-01-14 MED ORDER — NYSTATIN 100000 UNIT/GM EX CREA
1.0000 "application " | TOPICAL_CREAM | Freq: Two times a day (BID) | CUTANEOUS | Status: DC | PRN
  Filled 2016-01-14: qty 15

## 2016-01-14 MED ORDER — BUPIVACAINE LIPOSOME 1.3 % IJ SUSP
INTRAMUSCULAR | Status: AC
  Filled 2016-01-14: qty 20

## 2016-01-14 MED ORDER — DOCUSATE SODIUM 100 MG PO CAPS
100.0000 mg | ORAL_CAPSULE | Freq: Two times a day (BID) | ORAL | Status: DC
  Administered 2016-01-14 – 2016-01-17 (×7): 100 mg via ORAL
  Filled 2016-01-14 (×7): qty 1

## 2016-01-14 MED ORDER — AMLODIPINE BESYLATE 5 MG PO TABS
2.5000 mg | ORAL_TABLET | Freq: Every day | ORAL | Status: DC
  Administered 2016-01-15 – 2016-01-17 (×3): 2.5 mg via ORAL
  Filled 2016-01-14 (×3): qty 1

## 2016-01-14 MED ORDER — MAGNESIUM CITRATE PO SOLN
1.0000 | Freq: Once | ORAL | Status: DC | PRN
  Filled 2016-01-14: qty 296

## 2016-01-14 MED ORDER — SODIUM CHLORIDE 0.9 % IJ SOLN
INTRAMUSCULAR | Status: AC
  Filled 2016-01-14: qty 50

## 2016-01-14 MED ORDER — ARFORMOTEROL TARTRATE 15 MCG/2ML IN NEBU
15.0000 ug | INHALATION_SOLUTION | Freq: Two times a day (BID) | RESPIRATORY_TRACT | Status: DC
  Administered 2016-01-14 – 2016-01-17 (×6): 15 ug via RESPIRATORY_TRACT
  Filled 2016-01-14 (×8): qty 2

## 2016-01-14 MED ORDER — ONDANSETRON HCL 4 MG/2ML IJ SOLN
INTRAMUSCULAR | Status: DC | PRN
  Administered 2016-01-14: 4 mg via INTRAVENOUS

## 2016-01-14 MED ORDER — BUPIVACAINE-EPINEPHRINE (PF) 0.25% -1:200000 IJ SOLN
INTRAMUSCULAR | Status: DC | PRN
  Administered 2016-01-14: 30 mL

## 2016-01-14 MED ORDER — PROPOFOL 500 MG/50ML IV EMUL
INTRAVENOUS | Status: DC | PRN
  Administered 2016-01-14: 75 ug/kg/min via INTRAVENOUS

## 2016-01-14 MED ORDER — SODIUM CHLORIDE 0.9 % IV SOLN
INTRAVENOUS | Status: DC
  Administered 2016-01-14 (×2): via INTRAVENOUS

## 2016-01-14 MED ORDER — ONDANSETRON HCL 4 MG/2ML IJ SOLN: 4.0000 mg | Freq: Four times a day (QID) | INTRAMUSCULAR | Status: DC | PRN

## 2016-01-14 MED ORDER — OXYCODONE HCL 5 MG/5ML PO SOLN: 5.0000 mg | Freq: Once | ORAL | Status: DC | PRN

## 2016-01-14 MED ORDER — BISACODYL 10 MG RE SUPP: 10.0000 mg | Freq: Every day | RECTAL | Status: DC | PRN

## 2016-01-14 MED ORDER — EPHEDRINE SULFATE 50 MG/ML IJ SOLN
INTRAMUSCULAR | Status: DC | PRN
  Administered 2016-01-14 (×3): 10 mg via INTRAVENOUS
  Administered 2016-01-14: 5 mg via INTRAVENOUS
  Administered 2016-01-14: 10 mg via INTRAVENOUS

## 2016-01-14 MED ORDER — METOCLOPRAMIDE HCL 10 MG PO TABS: 5.0000 mg | ORAL_TABLET | Freq: Three times a day (TID) | ORAL | Status: DC | PRN

## 2016-01-14 MED ORDER — CEFAZOLIN SODIUM-DEXTROSE 2-4 GM/100ML-% IV SOLN
2.0000 g | Freq: Once | INTRAVENOUS | Status: AC
  Administered 2016-01-14: 2 g via INTRAVENOUS

## 2016-01-14 MED ORDER — VITAMIN D 1000 UNITS PO TABS
2000.0000 [IU] | ORAL_TABLET | Freq: Every day | ORAL | Status: DC
  Administered 2016-01-14 – 2016-01-17 (×4): 2000 [IU] via ORAL
  Filled 2016-01-14 (×4): qty 2

## 2016-01-14 MED ORDER — BUPIVACAINE-EPINEPHRINE (PF) 0.25% -1:200000 IJ SOLN
INTRAMUSCULAR | Status: AC
  Filled 2016-01-14: qty 30

## 2016-01-14 MED ORDER — METHOCARBAMOL 500 MG PO TABS
500.0000 mg | ORAL_TABLET | Freq: Four times a day (QID) | ORAL | Status: DC | PRN
  Administered 2016-01-14 – 2016-01-17 (×4): 500 mg via ORAL
  Filled 2016-01-14 (×4): qty 1

## 2016-01-14 MED ORDER — NEOMYCIN-POLYMYXIN B GU 40-200000 IR SOLN
Status: DC | PRN
  Administered 2016-01-14: 14 mL

## 2016-01-14 MED ORDER — NITROGLYCERIN 0.4 MG SL SUBL: 0.4000 mg | SUBLINGUAL_TABLET | SUBLINGUAL | Status: DC | PRN

## 2016-01-14 MED ORDER — MORPHINE SULFATE 10 MG/ML IJ SOLN
INTRAMUSCULAR | Status: DC | PRN
  Administered 2016-01-14: 10 mg

## 2016-01-14 MED ORDER — CEFAZOLIN SODIUM-DEXTROSE 2-4 GM/100ML-% IV SOLN
INTRAVENOUS | Status: AC
  Filled 2016-01-14: qty 100

## 2016-01-14 MED ORDER — ISOSORBIDE MONONITRATE ER 60 MG PO TB24
120.0000 mg | ORAL_TABLET | Freq: Every day | ORAL | Status: DC
  Administered 2016-01-14 – 2016-01-16 (×3): 120 mg via ORAL
  Filled 2016-01-14 (×3): qty 2

## 2016-01-14 MED ORDER — ALBUTEROL SULFATE (2.5 MG/3ML) 0.083% IN NEBU
2.5000 mg | INHALATION_SOLUTION | Freq: Four times a day (QID) | RESPIRATORY_TRACT | Status: DC | PRN
  Administered 2016-01-14 – 2016-01-17 (×6): 2.5 mg via RESPIRATORY_TRACT
  Filled 2016-01-14 (×7): qty 3

## 2016-01-14 MED ORDER — ACETAMINOPHEN 325 MG PO TABS: 650.0000 mg | ORAL_TABLET | Freq: Four times a day (QID) | ORAL | Status: DC | PRN

## 2016-01-14 MED ORDER — FAMOTIDINE 20 MG PO TABS
ORAL_TABLET | ORAL | Status: AC
  Filled 2016-01-14: qty 1

## 2016-01-14 MED ORDER — CLOPIDOGREL BISULFATE 75 MG PO TABS: 75.0000 mg | ORAL_TABLET | Freq: Every day | ORAL | Status: DC

## 2016-01-14 MED ORDER — CEFAZOLIN SODIUM-DEXTROSE 2-4 GM/100ML-% IV SOLN
2.0000 g | Freq: Four times a day (QID) | INTRAVENOUS | Status: AC
  Administered 2016-01-14 (×2): 2 g via INTRAVENOUS
  Filled 2016-01-14 (×2): qty 100

## 2016-01-14 MED ORDER — PAROXETINE HCL 10 MG PO TABS
10.0000 mg | ORAL_TABLET | Freq: Every day | ORAL | Status: DC
  Administered 2016-01-15 – 2016-01-17 (×3): 10 mg via ORAL
  Filled 2016-01-14 (×4): qty 1

## 2016-01-14 MED ORDER — ONDANSETRON HCL 4 MG PO TABS: 4.0000 mg | ORAL_TABLET | Freq: Four times a day (QID) | ORAL | Status: DC | PRN

## 2016-01-14 MED ORDER — ALBUTEROL SULFATE HFA 108 (90 BASE) MCG/ACT IN AERS: 2.0000 | INHALATION_SPRAY | Freq: Four times a day (QID) | RESPIRATORY_TRACT | Status: DC | PRN

## 2016-01-14 MED ORDER — METOPROLOL TARTRATE 25 MG PO TABS
25.0000 mg | ORAL_TABLET | Freq: Three times a day (TID) | ORAL | Status: DC
  Administered 2016-01-14 – 2016-01-17 (×9): 25 mg via ORAL
  Filled 2016-01-14 (×11): qty 1

## 2016-01-14 MED ORDER — BUPIVACAINE HCL (PF) 0.5 % IJ SOLN
INTRAMUSCULAR | Status: DC | PRN
  Administered 2016-01-14: 3 mL via INTRATHECAL

## 2016-01-14 MED ORDER — UMECLIDINIUM BROMIDE 62.5 MCG/INH IN AEPB
1.0000 | INHALATION_SPRAY | Freq: Every day | RESPIRATORY_TRACT | Status: DC
  Administered 2016-01-15 – 2016-01-16 (×2): 1 via RESPIRATORY_TRACT
  Filled 2016-01-14: qty 7

## 2016-01-14 MED ORDER — LACTATED RINGERS IV SOLN
INTRAVENOUS | Status: DC
  Administered 2016-01-14: 07:00:00 via INTRAVENOUS

## 2016-01-14 MED ORDER — NEOMYCIN-POLYMYXIN B GU 40-200000 IR SOLN
Status: AC
  Filled 2016-01-14: qty 20

## 2016-01-14 MED ORDER — FAMOTIDINE 20 MG PO TABS
20.0000 mg | ORAL_TABLET | Freq: Once | ORAL | Status: AC
  Administered 2016-01-14: 20 mg via ORAL

## 2016-01-14 MED ORDER — OXYCODONE HCL 5 MG PO TABS
5.0000 mg | ORAL_TABLET | ORAL | Status: DC | PRN
  Administered 2016-01-14 – 2016-01-16 (×11): 10 mg via ORAL
  Administered 2016-01-17: 5 mg via ORAL
  Filled 2016-01-14 (×11): qty 2
  Filled 2016-01-14: qty 1

## 2016-01-14 MED ORDER — MORPHINE SULFATE (PF) 10 MG/ML IV SOLN
INTRAVENOUS | Status: AC
  Filled 2016-01-14: qty 1

## 2016-01-14 MED ORDER — METOCLOPRAMIDE HCL 5 MG/ML IJ SOLN: 5.0000 mg | Freq: Three times a day (TID) | INTRAMUSCULAR | Status: DC | PRN

## 2016-01-14 MED ORDER — MORPHINE SULFATE (PF) 2 MG/ML IV SOLN
2.0000 mg | INTRAVENOUS | Status: DC | PRN
  Administered 2016-01-14 – 2016-01-15 (×5): 2 mg via INTRAVENOUS
  Filled 2016-01-14 (×5): qty 1

## 2016-01-14 MED ORDER — PHENOL 1.4 % MT LIQD
1.0000 | OROMUCOSAL | Status: DC | PRN
Start: 2016-01-14 — End: 2016-01-17
  Filled 2016-01-14: qty 177

## 2016-01-14 MED ORDER — METHOCARBAMOL 1000 MG/10ML IJ SOLN
500.0000 mg | Freq: Four times a day (QID) | INTRAVENOUS | Status: DC | PRN
  Filled 2016-01-14: qty 5

## 2016-01-14 MED ORDER — ZOLPIDEM TARTRATE 5 MG PO TABS: 5.0000 mg | ORAL_TABLET | Freq: Every evening | ORAL | Status: DC | PRN

## 2016-01-14 MED ORDER — ATORVASTATIN CALCIUM 20 MG PO TABS
40.0000 mg | ORAL_TABLET | Freq: Every day | ORAL | Status: DC
  Administered 2016-01-14 – 2016-01-16 (×3): 40 mg via ORAL
  Filled 2016-01-14 (×3): qty 2

## 2016-01-14 MED ORDER — MIDAZOLAM HCL 2 MG/2ML IJ SOLN
INTRAMUSCULAR | Status: DC | PRN
  Administered 2016-01-14: 2 mg via INTRAVENOUS

## 2016-01-14 MED ORDER — MAGNESIUM HYDROXIDE 400 MG/5ML PO SUSP: 30.0000 mL | Freq: Every day | ORAL | Status: DC | PRN

## 2016-01-14 MED ORDER — OXYCODONE HCL 5 MG PO TABS: 5.0000 mg | ORAL_TABLET | Freq: Once | ORAL | Status: DC | PRN

## 2016-01-14 SURGICAL SUPPLY — 61 items
BANDAGE ACE 6X5 VEL STRL LF (GAUZE/BANDAGES/DRESSINGS) ×3 IMPLANT
BLADE SAW 1 (BLADE) ×3 IMPLANT
BLOCK CUTTING FEMUR 4+ (MISCELLANEOUS) IMPLANT
BLOCK CUTTING TIBIAL 4 RT (MISCELLANEOUS) IMPLANT
BLOCK CUTTING TIBIAL 4 RT MIS (MISCELLANEOUS) IMPLANT
CANISTER SUCT 1200ML W/VALVE (MISCELLANEOUS) ×3 IMPLANT
CANISTER SUCT 3000ML (MISCELLANEOUS) ×6 IMPLANT
CAPT KNEE TOTAL 3 ×3 IMPLANT
CATH FOL LEG HOLDER (MISCELLANEOUS) ×3 IMPLANT
CATH TRAY METER 16FR LF (MISCELLANEOUS) ×3 IMPLANT
CEMENT HV SMART SET (Cement) ×6 IMPLANT
CHLORAPREP W/TINT 26ML (MISCELLANEOUS) ×6 IMPLANT
COOLER POLAR GLACIER W/PUMP (MISCELLANEOUS) ×3 IMPLANT
CUFF TOURN 24 STER (MISCELLANEOUS) IMPLANT
CUFF TOURN 30 STER DUAL PORT (MISCELLANEOUS) ×3 IMPLANT
DRAPE INCISE IOBAN 66X45 STRL (DRAPES) ×6 IMPLANT
DRAPE SHEET LG 3/4 BI-LAMINATE (DRAPES) ×6 IMPLANT
ELECT CAUTERY BLADE 6.4 (BLADE) ×3 IMPLANT
ELECT REM PT RETURN 9FT ADLT (ELECTROSURGICAL) ×3
ELECTRODE REM PT RTRN 9FT ADLT (ELECTROSURGICAL) ×1 IMPLANT
FEMUR BONE MODEL IMPLANT
GAUZE PETRO XEROFOAM 1X8 (MISCELLANEOUS) ×3 IMPLANT
GAUZE SPONGE 4X4 12PLY STRL (GAUZE/BANDAGES/DRESSINGS) ×3 IMPLANT
GLOVE BIOGEL PI IND STRL 9 (GLOVE) ×1 IMPLANT
GLOVE BIOGEL PI INDICATOR 9 (GLOVE) ×2
GLOVE INDICATOR 8.0 STRL GRN (GLOVE) ×3 IMPLANT
GLOVE SURG ORTHO 8.0 STRL STRW (GLOVE) ×3 IMPLANT
GLOVE SURG SYN 9.0  PF PI (GLOVE) ×2
GLOVE SURG SYN 9.0 PF PI (GLOVE) ×1 IMPLANT
GOWN SRG 2XL LVL 4 RGLN SLV (GOWNS) ×1 IMPLANT
GOWN STRL NON-REIN 2XL LVL4 (GOWNS) ×2
GOWN STRL REUS W/ TWL LRG LVL3 (GOWN DISPOSABLE) ×1 IMPLANT
GOWN STRL REUS W/ TWL XL LVL3 (GOWN DISPOSABLE) ×1 IMPLANT
GOWN STRL REUS W/TWL LRG LVL3 (GOWN DISPOSABLE) ×2
GOWN STRL REUS W/TWL XL LVL3 (GOWN DISPOSABLE) ×2
HANDPIECE INTERPULSE COAX TIP (DISPOSABLE) ×2
HOOD PEEL AWAY FLYTE STAYCOOL (MISCELLANEOUS) ×6 IMPLANT
IMMBOLIZER KNEE 19 BLUE UNIV (SOFTGOODS) ×3 IMPLANT
KIT RM TURNOVER STRD PROC AR (KITS) ×3 IMPLANT
KNEE MEDACTA TIBIAL/FEMORAL BL (Knees) ×3 IMPLANT
KNIFE SCULPS 14X20 (INSTRUMENTS) ×3 IMPLANT
NDL SAFETY 18GX1.5 (NEEDLE) ×3 IMPLANT
NEEDLE SPNL 18GX3.5 QUINCKE PK (NEEDLE) ×3 IMPLANT
NEEDLE SPNL 20GX3.5 QUINCKE YW (NEEDLE) ×3 IMPLANT
NS IRRIG 1000ML POUR BTL (IV SOLUTION) ×3 IMPLANT
PACK TOTAL KNEE (MISCELLANEOUS) ×3 IMPLANT
PAD WRAPON POLAR KNEE (MISCELLANEOUS) ×1 IMPLANT
SET HNDPC FAN SPRY TIP SCT (DISPOSABLE) ×1 IMPLANT
SOL .9 NS 3000ML IRR  AL (IV SOLUTION) ×2
SOL .9 NS 3000ML IRR UROMATIC (IV SOLUTION) ×1 IMPLANT
STAPLER SKIN PROX 35W (STAPLE) ×3 IMPLANT
SUCTION FRAZIER HANDLE 10FR (MISCELLANEOUS) ×2
SUCTION TUBE FRAZIER 10FR DISP (MISCELLANEOUS) ×1 IMPLANT
SUT DVC 2 QUILL PDO  T11 36X36 (SUTURE) ×2
SUT DVC 2 QUILL PDO T11 36X36 (SUTURE) ×1 IMPLANT
SUT V-LOC 90 ABS DVC 3-0 CL (SUTURE) ×3 IMPLANT
SYR 20CC LL (SYRINGE) ×3 IMPLANT
SYR 50ML LL SCALE MARK (SYRINGE) ×6 IMPLANT
TOWEL OR 17X26 4PK STRL BLUE (TOWEL DISPOSABLE) ×3 IMPLANT
TOWER CARTRIDGE SMART MIX (DISPOSABLE) ×3 IMPLANT
WRAPON POLAR PAD KNEE (MISCELLANEOUS) ×3

## 2016-01-14 NOTE — Op Note (Signed)
01/14/2016  9:54 AM  PATIENT:  Donald Juarez  70 y.o. male  PRE-OPERATIVE DIAGNOSIS:  primary osteoarthritis of right knee  POST-OPERATIVE DIAGNOSIS:  primary osteoarthritis of right knee  PROCEDURE:  Procedure(s): TOTAL KNEE ARTHROPLASTY (Right)  SURGEON: Laurene Footman, MD  ASSISTANTS: Rachelle Hora [facility]  ANESTHESIA:   spinal  EBL:  Total I/O In: 950 [I.V.:950] Out: 400 [Urine:100; Blood:300]  BLOOD ADMINISTERED:none  DRAINS: none   LOCAL MEDICATIONS USED:  MARCAINE    and OTHER morphine and Exparel  SPECIMEN:  No Specimen  DISPOSITION OF SPECIMEN:  N/A  COUNTS:  YES  TOURNIQUET:   25 minutes at 300 mmHg  IMPLANTS: Medacta GMK sphere 4+ femur 4 tibia short stem 10 mm insert 3 patella, all components cemented  DICTATION: .Dragon Dictation patient brought the operating room and after adequate spinal anesthesia was obtained the right leg was prepped and draped in sterile fashion was turned by the upper thigh. After prepping and draping in usual sterile manner, appropriate patient identification and timeout procedures were completed. An skin incision made followed by medial parapatellar arthrotomy. Inspection revealed eburnated bone with significant bone loss in the medial compartment both tibia and femur with exposed bone on the femoral trochlea and patella as well as exposed bone laterally and the lateral compartment. The fat pad and anterior cruciate ligament were excised and proximal tibia exposure for application of the my knee cutting block. My knee cutting block applied and proximal tibia cut carried out. After bone removal the ductal procedures carried out from the distal femur and distal femoral cut made. The 4-in-1 cutting block was applied to the distal femur anterior posterior and chamfer cuts made with excision of the menisci at this time. There were spurs a posteriorly on the medial femoral condyle there removed with use of a curved osteotome. The spurs medially on  the tibia were also removed. The proximal tibia was then adequately exposed for application of the [id number] tibial template was pinned in position and proximal tibial preparation carried out with a keel punch placed for trialing. The 4+ femur was impacted on the femur and a 10 mm insert gave good stability with full extension possible. The distal femoral drill holes were made in the trochlear groove cut made with the reamer. Next the trials were removed and the patella cut using freehand technique after first cutting with the guide. It sized to a size 3 after drill holes were made. At this point the joint was thoroughly irrigated and the above injections given. After the injections were given the tourniquet was raised and the bony surfaces thoroughly irrigated and dried with the components then cemented in place first the tibia with setting the polyethylene and the femoral component holding the knee in extension as the cement set. Patellar button clamped into place. After the cement had set and excess cemented been removed tourniquet was let down and hemostasis checked electrocautery. The knee was again thoroughly irrigated and the arthrotomy closed using a heavy Quill.3-0 v-loc used subcutaneously and skin staples then applied. Xeroform 4 x 4's ABDs and web roll Polar Care and Ace wrap applied  PLAN OF CARE: Admit to inpatient   PATIENT DISPOSITION:  PACU - hemodynamically stable.

## 2016-01-14 NOTE — Anesthesia Preprocedure Evaluation (Signed)
Anesthesia Evaluation  Patient identified by MRN, date of birth, ID band Patient awake    Reviewed: Allergy & Precautions, H&P , NPO status , Patient's Chart, lab work & pertinent test results  History of Anesthesia Complications Negative for: history of anesthetic complications  Airway Mallampati: III  TM Distance: <3 FB Neck ROM: limited    Dental no notable dental hx. (+) Poor Dentition, Missing, Upper Dentures, Lower Dentures   Pulmonary neg shortness of breath, asthma , COPD, former smoker,    Pulmonary exam normal breath sounds clear to auscultation       Cardiovascular Exercise Tolerance: Good hypertension, (-) angina+ CAD, + Past MI and + Peripheral Vascular Disease  (-) DOE Normal cardiovascular exam+ dysrhythmias  Rhythm:regular Rate:Normal     Neuro/Psych Seizures -, Well Controlled,  PSYCHIATRIC DISORDERS Anxiety    GI/Hepatic negative GI ROS, Neg liver ROS, neg GERD  ,  Endo/Other  negative endocrine ROS  Renal/GU      Musculoskeletal  (+) Arthritis ,   Abdominal   Peds  Hematology negative hematology ROS (+)   Anesthesia Other Findings Past Medical History: No date: Anginal pain (HCC)     Comment: feels this as jaw pain.  takes imdur for this.              rarely uses nitro. No date: Anxiety No date: Arthritis No date: Asthma No date: Bilateral claudication of lower limb (HCC) No date: Carotid stenosis     Comment: a. 07/2013 Carotid U/S: 100% RICA (pt prev               reported h/o [id number]), LICA [id number], bilat ECA 50%,               normal vertebrals and subclavians bilat-->F/U               needed in 01/2014. No date: Cataract of left eye No date: Cataract, right     Comment: a. 10/2010 s/p cataract surgery  No date: COPD (chronic obstructive pulmonary disease) (* No date: Coronary artery disease     Comment: a. 06/1996 & 03/2005 Cath: non-obs dzs;  b.               07/2009 NSTEMI/Cath: LCX  99->PCI/DES extending               into OM1;  c. 01/2013 Cath: patent LCX               stent-->Med Rx.; d. cath 03/2014: RPAV dzs->Med               rx; e. 10/2014 Cath/PCI: LM 30, LAD 20p/m, LCX               50ost/p, 10 ISR, 60d, OM1 min irregs, OM2 50,               RCA 30ost, 81m, 60d, RPDA 70(2.5x8 Xience DES),              RPAV 90small(PTCA); f. 10/2014 Relook Cath:               RPAV 99(Med Rx), EF 45-50. No date: Dysrhythmia     Comment: since ablation, flutter resolved. No date: Essential hypertension No date: Hyperkalemia No date: Hyperlipidemia 2015: Myocardial infarction     Comment: had stent inserted and then had heart attack               after. stent inserted due to blockage No date: Paroxysmal atrial  flutter ([place])     Comment: a. CHA2DS2VASc = 3 -> eliquis;  b. 07/2013               Echo: EF 50-55%, mildly dil LA.c. s/p RFCA               10/31/2013 by Dr Caryl Comes No date: Prostate cancer [facility]) No date: Pulmonary nodule, right     Comment: a. 0.7cm RLL - stable by CT 06/2013. No date: Rectal cancer ([place])     Comment: a. Status post colostomy No date: Seizures ([place])     Comment: a. last 30 years ago No date: Shingles     Comment: a. 09/2012. No date: SSS (sick sinus syndrome) ([place])     Comment: a. 12/2009 S/P MDT [place] ADDR01 DC PPM, ser #              [id number] ([name]). No date: Syncope and collapse  Past Surgical History: 10/31/2013: ABLATION     Comment: CTI ablation by Dr Caryl Comes for atrial flutter 10/31/2013: ATRIAL FLUTTER ABLATION N/A     Comment: Procedure: ATRIAL FLUTTER ABLATION;  Surgeon:               [name], MD;  Location[facility] C[facility] LAB;                Service: Cardiovascular;  Laterality: N/A; 07/2009: CARDIAC CATHETERIZATION     Comment: [facility] 01/2013: CARDIAC CATHETERIZATION     Comment: [facility] 03/2014: CARDIAC CATHETERIZATION     Comment: [facility] 11/02/2014: CARDIAC CATHETERIZATION N/A     Comment: Procedure: Left Heart Cath and Cors/Grafts                Angiography;  Surgeon: [name], MD;                Location: [place] CV LAB;  Service:               Cardiovascular;  Laterality: N/A; 11/02/2014: CARDIAC CATHETERIZATION N/A     Comment: Procedure: Coronary Stent Intervention;                Surgeon: [name], MD;  Location: [place] CV LAB;  Service: Cardiovascular;                Laterality: N/A; 11/03/2014: CARDIAC CATHETERIZATION N/A     Comment: Procedure: Left Heart Cath and Coronary               Angiography;  Surgeon: [name], MD;                Location: [place] CV LAB;  Service:               Cardiovascular;  Laterality: N/A; 2011: CARDIAC SURGERY     Comment: Stents placed  No date: CATARACT EXTRACTION Left 06/01/2014: CATARACT EXTRACTION W/PHACO Left     Comment: Procedure: CATARACT EXTRACTION PHACO AND               INTRAOCULAR LENS PLACEMENT (IOC);  Surgeon:               [name], MD;  Location: [name] ORS;                Service: Ophthalmology;  Laterality: Left; 2012: cataract surgery No date: colon surgery     Comment: cancer removal No date: COLON SURGERY  Comment: colostomy No date: CORONARY ANGIOPLASTY 2011: INSERT / REPLACE / REMOVE PACEMAKER     Comment: just a pacer 1980's: KNEE ARTHROSCOPY Right 01/17/2010: PACEMAKER PLACEMENT  BMI    Body Mass Index:  27.69 kg/m      Reproductive/Obstetrics negative OB ROS                             Anesthesia Physical Anesthesia Plan  ASA: III  Anesthesia Plan: Spinal   Post-op Pain Management:    Induction:   Airway Management Planned:   Additional Equipment:   Intra-op Plan:   Post-operative Plan:   Informed Consent: I have reviewed the patients History and Physical, chart, labs and discussed the procedure including the risks, benefits and alternatives for the proposed anesthesia with the patient or authorized representative who has indicated  his/her understanding and acceptance.   Dental Advisory Given  Plan Discussed with: Anesthesiologist, CRNA and Surgeon  Anesthesia Plan Comments:         Anesthesia Quick Evaluation

## 2016-01-14 NOTE — Progress Notes (Signed)
Pt refused Albuterol and pulmicort treatment after requesting Albuterol. Nebulizer and medication was already opened for use and was thrown away. Pt then changed his mind and new medication was pulled for use.

## 2016-01-14 NOTE — H&P (Signed)
Reviewed paper H+P, will be scanned into chart. No changes noted.  

## 2016-01-14 NOTE — Care Management Note (Signed)
Case Management Note  Patient Details  Name: Donald Juarez MRN: [id number] Date of Birth: 07-26-1945  Subjective/Objective:  Met with patient and his wife at bedside. He will be returning home with his wife and she is his care taker. Offered choice of home health agencies. Referral to [facility] for home health PT. Patient specifically requested a male PT. Relayed that request to [facility].  He will need a walker. Ordered from [facility]: [phone]. Called Lovenox 40 mg # 14 , no refills. PCP: Dr. Dorann Ou.               Action/Plan: Lovenox called in. Walker delivered. HH PT with [facility]. It is anticipated that patient will be discharged on Monday.   Expected Discharge Date:                  Expected Discharge Plan:  [place]  In-House Referral:     Discharge planning Services  CM Consult  Post Acute Care Choice:  Durable Medical Equipment, Home Health Choice offered to:  Patient  DME Arranged:  Walker rolling DME Agency:  [place]:  PT [place] Agency:  [facility] (formerly [facility])  Status of Service:  In process, will continue to follow  If discussed at Long Length of Stay Meetings, dates discussed:    Additional Comments:  Jolly Mango, RN 01/14/2016, 2:47 PM

## 2016-01-14 NOTE — Plan of Care (Signed)
Problem: Activity: Goal: Will remain free from falls Outcome: Progressing Educated on fall prevention measures including call bell, bed alarms, non slip footwear, hourly rounding  Problem: Education: Goal: Knowledge of the prescribed therapeutic regimen will improve Outcome: Progressing Educated on surgical pathway and goals to met prior to discharge  Problem: Pain Management: Goal: Pain level will decrease with appropriate interventions Outcome: Progressing Discussed pain goals and medication regimen during stay, including pain goals and expectations

## 2016-01-14 NOTE — NC FL2 (Signed)
[place] LEVEL OF CARE SCREENING TOOL     IDENTIFICATION  Patient Name: Donald Juarez Birthdate: 1945/10/15 Sex: male Admission Date (Current Location): 01/14/2016  [place] and [place] Number:  Engineering geologist and Address:  [facility], [address] [place], [place] [address]      Provider Number: [id number]  Attending Physician Name and Address:  [name], MD  Relative Name and Phone Number:       Current Level of Care: Hospital Recommended Level of Care: [place] Prior Approval Number:    Date Approved/Denied:   PASRR Number:  ([id number])  Discharge Plan: SNF    Current Diagnoses: Patient Active Problem List   Diagnosis Date Noted  . Primary localized osteoarthritis of right knee 01/14/2016  . Essential hypertension   . Hyperlipidemia   . SSS (sick sinus syndrome) ([place])   . Unstable angina ([place]) 11/03/2014  . NSTEMI (non-ST elevated myocardial infarction) ([place])   . Effort angina ([place]) 11/02/2014  . S/P PTCA (percutaneous transluminal coronary angioplasty) 11/02/2014  . Coronary artery disease involving native coronary artery   . Prostate cancer ([place])   . Anxiety and depression 06/18/2014  . Fatigue 11/14/2013  . Typical atrial flutter ([place]) 10/31/2013  . Bilateral claudication of lower limb ([place])   . Coronary artery disease   . Paroxysmal atrial flutter ([place])   . Hypertension   . Carotid stenosis   . COPD, severe GOLD GRADE D 03/28/2013  . Solitary pulmonary nodule 03/28/2013    Orientation RESPIRATION BLADDER Height & Weight     Self, Time, Situation, Place  Normal Continent Weight: 193 lb (87.5 kg) Height:  5\' 10"  (177.8 cm)  BEHAVIORAL SYMPTOMS/MOOD NEUROLOGICAL BOWEL NUTRITION STATUS   (none)  (none) Continent Diet (Regular Diet )  AMBULATORY STATUS COMMUNICATION OF NEEDS Skin   Extensive Assist Verbally Surgical wounds (Incision: Right Knee )                        Personal Care Assistance Level of Assistance  Bathing, Feeding, Dressing Bathing Assistance: Limited assistance Feeding assistance: Independent Dressing Assistance: Limited assistance     Functional Limitations Info  Sight, Hearing, Speech Sight Info: Adequate Hearing Info: Adequate Speech Info: Adequate    SPECIAL CARE FACTORS FREQUENCY  PT (By licensed PT), OT (By licensed OT)     PT Frequency:  (5) OT Frequency:  (5)            Contractures      Additional Factors Info  Code Status, Allergies Code Status Info:  (Full Code. ) Allergies Info:  (No Known Allergies. )           Current Medications (01/14/2016):  This is the current hospital active medication list Current Facility-Administered Medications  Medication Dose Route Frequency Provider Last Rate Last Dose  . 0.9 %  sodium chloride infusion   Intravenous Continuous [name], MD 75 mL/hr at 01/14/16 1325    . acetaminophen (TYLENOL) tablet 650 mg  650 mg Oral Q6H PRN [name], MD       Or  . acetaminophen (TYLENOL) suppository 650 mg  650 mg Rectal Q6H PRN [name], MD      . albuterol (PROVENTIL) (2.5 MG/3ML) 0.083% nebulizer solution 2.5 mg  2.5 mg Nebulization Q6H PRN [name], MD      . Derrill Memo ON 01/15/2016] amLODipine (NORVASC) tablet 2.5 mg  2.5 mg Oral Daily [name], MD      .  arformoterol (BROVANA) nebulizer solution 15 mcg  15 mcg Nebulization BID [name], MD      . aspirin chewable tablet 81 mg  81 mg Oral BID [name], MD      . atorvastatin (LIPITOR) tablet 40 mg  40 mg Oral QHS [name], MD      . bisacodyl (DULCOLAX) suppository 10 mg  10 mg Rectal Daily PRN [name], MD      . budesonide (PULMICORT) nebulizer solution 0.5 mg  0.5 mg Nebulization BID [name], MD      . ceFAZolin (ANCEF) IVPB 2g/100 mL premix  2 g Intravenous Q6H [name], MD   2 g at 01/14/16 1325  . cholecalciferol (VITAMIN D) tablet 2,000 Units  2,000 Units Oral Daily [name], MD   2,000 Units at 01/14/16 1325  . [START ON 01/15/2016] clopidogrel (PLAVIX) tablet 75 mg  75 mg Oral Q breakfast [name], MD      . diphenhydrAMINE (BENADRYL) 12.5 MG/5ML elixir 12.5-25 mg  12.5-25 mg Oral Q4H PRN [name], MD      . docusate sodium (COLACE) capsule 100 mg  100 mg Oral BID [name], MD   100 mg at 01/14/16 1325  . famotidine (PEPCID) 20 MG tablet           . isosorbide mononitrate (IMDUR) 24 hr tablet 120 mg  120 mg Oral QHS [name], MD      . lactated ringers infusion   Intravenous Continuous Molli Barrows, MD 100 mL/hr at 01/14/16 [phone]    . magnesium citrate solution 1 Bottle  1 Bottle Oral Once PRN [name], MD      . magnesium hydroxide (MILK OF MAGNESIA) suspension 30 mL  30 mL Oral Daily PRN [name], MD      . menthol-cetylpyridinium (CEPACOL) lozenge 3 mg  1 lozenge Oral PRN [name], MD       Or  . phenol (CHLORASEPTIC) mouth spray 1 spray  1 spray Mouth/Throat PRN [name], MD      . methocarbamol (ROBAXIN) tablet 500 mg  500 mg Oral Q6H PRN [name], MD       Or  . methocarbamol (ROBAXIN) 500 mg in dextrose 5 % 50 mL IVPB  500 mg Intravenous Q6H PRN [name], MD      . metoCLOPramide (REGLAN) tablet 5-10 mg  5-10 mg Oral Q8H PRN [name], MD       Or  . metoCLOPramide (REGLAN) injection 5-10 mg  5-10 mg Intravenous Q8H PRN [name], MD      . metoprolol tartrate (LOPRESSOR) tablet 25 mg  25 mg Oral TID [name], MD   25 mg at 01/14/16 1452  . morphine 2 MG/ML injection 2 mg  2 mg Intravenous Q1H PRN [name], MD   2 mg at 01/14/16 1531  . nitroGLYCERIN (NITROSTAT) SL tablet 0.4 mg  0.4 mg Sublingual Q5 min PRN [name], MD      . nystatin cream (MYCOSTATIN) 1 application  1 application Topical BID PRN [name], MD      . ondansetron [facility]) tablet 4 mg  4 mg Oral Q6H PRN [name], MD       Or  . ondansetron [facility]) injection 4 mg  4 mg Intravenous Q6H PRN [name], MD      .  oxyCODONE (Oxy IR/ROXICODONE) immediate release tablet 5-10 mg  5-10 mg Oral Q3H PRN [name], MD  10 mg at 01/14/16 1452  . [START ON 01/15/2016] PARoxetine (PAXIL) tablet 10 mg  10 mg Oral Daily [name], MD      . Derrill Memo ON 01/15/2016] umeclidinium bromide (INCRUSE ELLIPTA) 62.5 MCG/INH 1 puff  1 puff Inhalation Q1200 [name], MD      . zolpidem [facility]) tablet 5 mg  5 mg Oral QHS PRN [name], MD         Discharge Medications: Please see discharge summary for a list of discharge medications.  Relevant Imaging Results:  Relevant Lab Results:   Additional Information  (SSN: [id number])  Maximillian Habibi, Veronia Beets, LCSW

## 2016-01-14 NOTE — Transfer of Care (Signed)
Immediate Anesthesia Transfer of Care Note  Patient: Donald Juarez  Procedure(s) Performed: Procedure(s): TOTAL KNEE ARTHROPLASTY (Right)  Patient Location: PACU  Anesthesia Type:Spinal  Level of Consciousness: awake, alert , oriented and patient cooperative  Airway & Oxygen Therapy: Patient Spontanous Breathing and Patient connected to nasal cannula oxygen  Post-op Assessment: Report given to RN and Post -op Vital signs reviewed and stable  Post vital signs: Reviewed and stable  Last Vitals:  Vitals:   01/14/16 0614 01/14/16 0951  BP: 108/67 (!) 142/70  Pulse: 80 60  Resp: 18 17  Temp: 36.5 C 36.2 C    Last Pain:  Vitals:   01/14/16 0951  TempSrc:   PainSc: Asleep         Complications: No apparent anesthesia complications

## 2016-01-14 NOTE — Anesthesia Procedure Notes (Signed)
Spinal  Patient location during procedure: OR Start time: 01/14/2016 9:20 AM End time: 01/14/2016 9:41 AM Staffing Anesthesiologist: Andria Frames Resident/CRNA: Martha Clan Performed: anesthesiologist  Preanesthetic Checklist Completed: patient identified, site marked, surgical consent, pre-op evaluation, timeout performed, IV checked, risks and benefits discussed and monitors and equipment checked Spinal Block Patient position: sitting Prep: ChloraPrep Patient monitoring: heart rate, continuous pulse ox, blood pressure and cardiac monitor Approach: midline Location: L4-5 Injection technique: single-shot Needle Needle type: Whitacre and Introducer  Needle gauge: 24 G Needle length: 9 cm Assessment Sensory level: T10 Additional Notes First attempts by Kylo Gavin final attempts by Rosey Bath  Negative paresthesia. Negative blood return. Positive free-flowing CSF. Expiration date of kit checked and confirmed. Patient tolerated procedure well, without complications.

## 2016-01-14 NOTE — Evaluation (Signed)
Physical Therapy Evaluation Patient Details Name: Donald Juarez MRN: [id number] DOB: 10-28-1945 Today's Date: 01/14/2016   History of Present Illness  Pt admitted for R TKR and is POD 0 at time of evaluation.  Clinical Impression  Pt is a pleasant 70 year old male who was admitted for R TKR. Pt performs bed mobility, transfers, and ambulation with cga and RW. Pt able to perform 10 SLRs with independence, does not require KI at this time. Pt demonstrates deficits with strength/ROM/mobility/pain. Pt motivated to perform therapy this date. Would benefit from skilled PT to address above deficits and promote optimal return to PLOF. Recommend transition to [facility] upon discharge from acute hospitalization.       Follow Up Recommendations Home health PT    Equipment Recommendations  None recommended by PT (just received RW)    Recommendations for Other Services       Precautions / Restrictions Precautions Precautions: Fall;Knee Precaution Booklet Issued: No Restrictions Weight Bearing Restrictions: Yes RLE Weight Bearing: Weight bearing as tolerated      Mobility  Bed Mobility Overal bed mobility: Needs Assistance Bed Mobility: Supine to Sit     Supine to sit: Min guard     General bed mobility comments: safe technique performed with cues for sequencing. Once seated at EOB, pt able to scoot towards EOB. Upright posture noted. Pt became dizzy upon movement, however resolved once seated for a few minutes  Transfers Overall transfer level: Needs assistance Equipment used: Rolling walker (2 wheeled) Transfers: Sit to/from Stand Sit to Stand: Min guard         General transfer comment: transfers performed with RW and safe technique. Once standing, pt demonstrates upright posture with limited WBing noted through R LE. No dizziness with standiing  Ambulation/Gait Ambulation/Gait assistance: Min guard Ambulation Distance (Feet): 15 Feet Assistive device: Rolling walker (2  wheeled) Gait Pattern/deviations: Step-through pattern     General Gait Details: ambulated using rw and slow step to gait pattern. Pt demonstrates ability to navigate room obstacles while ambulating around bed to recliner. Increased pain noted with ambulation  Stairs            Wheelchair Mobility    Modified Rankin (Stroke Patients Only)       Balance                                             Pertinent Vitals/Pain Pain Assessment: Faces Faces Pain Scale: Hurts whole lot Pain Location: R knee with mobility Pain Descriptors / Indicators: Operative site guarding;Discomfort Pain Intervention(s): Limited activity within patient's tolerance;Patient requesting pain meds-RN notified;Ice applied    Home Living Family/patient expects to be discharged to:: Private residence Living Arrangements: Spouse/significant other Available Help at Discharge: Family Type of Home: House Home Access: Level entry     Home Layout: Multi-level;Able to live on main level with bedroom/bathroom Home Equipment: Gilford Rile - 2 wheels;Cane - single point (hospital bed)      Prior Function Level of Independence: Independent               Hand Dominance        Extremity/Trunk Assessment   Upper Extremity Assessment Upper Extremity Assessment: Overall WFL for tasks assessed    Lower Extremity Assessment Lower Extremity Assessment: Generalized weakness (R LE grossly 3/5; L LE grossly 4/5)       Communication  Communication: No difficulties  Cognition Arousal/Alertness: Awake/alert Behavior During Therapy: WFL for tasks assessed/performed Overall Cognitive Status: Within Functional Limits for tasks assessed                      General Comments      Exercises Total Joint Exercises Goniometric ROM: R LE knee AAROM: 7-78 degrees and limited by pain Other Exercises Other Exercises: Supine ther-ex performed including R LE ankle pumps, quad sets, SLRS,  hip abd/add, and knee flexion stretches x 10 reps with cga and safe technique. Increased pain noted with ther-ex.   Assessment/Plan    PT Assessment Patient needs continued PT services  PT Problem List Decreased strength;Decreased range of motion;Decreased activity tolerance;Decreased balance;Decreased safety awareness;Decreased mobility;Pain          PT Treatment Interventions DME instruction;Gait training;Therapeutic exercise    PT Goals (Current goals can be found in the Care Plan section)  Acute Rehab PT Goals Patient Stated Goal: to get stronger PT Goal Formulation: With patient Time For Goal Achievement: 01/28/16 Potential to Achieve Goals: Good    Frequency BID   Barriers to discharge        Co-evaluation               End of Session Equipment Utilized During Treatment: Gait belt Activity Tolerance: Patient limited by pain Patient left: in chair;with chair alarm set;with family/visitor present Nurse Communication: Mobility status         Time: [id number] PT Time Calculation (min) (ACUTE ONLY): 33 min   Charges:   PT Evaluation $PT Eval Moderate Complexity: 1 Procedure PT Treatments $Therapeutic Exercise: 8-22 mins   PT G Codes:        Donald Juarez 01-20-2016, 4:49 PM Greggory Stallion, PT, DPT [phone]

## 2016-01-15 LAB — CBC
HCT: 31.6 % — ABNORMAL LOW (ref 40.0–52.0)
HEMOGLOBIN: 11.2 g/dL — AB (ref 13.0–18.0)
MCH: 33.2 pg (ref 26.0–34.0)
MCHC: 35.4 g/dL (ref 32.0–36.0)
MCV: 93.7 fL (ref 80.0–100.0)
PLATELETS: 173 10*3/uL (ref 150–440)
RBC: 3.37 MIL/uL — AB (ref 4.40–5.90)
RDW: 13.2 % (ref 11.5–14.5)
WBC: 7.6 10*3/uL (ref 3.8–10.6)

## 2016-01-15 LAB — BASIC METABOLIC PANEL
ANION GAP: 4 — AB (ref 5–15)
BUN: 15 mg/dL (ref 6–20)
CHLORIDE: 103 mmol/L (ref 101–111)
CO2: 25 mmol/L (ref 22–32)
Calcium: 8.2 mg/dL — ABNORMAL LOW (ref 8.9–10.3)
Creatinine, Ser: 0.69 mg/dL (ref 0.61–1.24)
Glucose, Bld: 144 mg/dL — ABNORMAL HIGH (ref 65–99)
POTASSIUM: 4.1 mmol/L (ref 3.5–5.1)
Sodium: 132 mmol/L — ABNORMAL LOW (ref 135–145)

## 2016-01-15 MED ORDER — ENOXAPARIN SODIUM 30 MG/0.3ML ~~LOC~~ SOLN
30.0000 mg | Freq: Two times a day (BID) | SUBCUTANEOUS | Status: DC
  Administered 2016-01-15 – 2016-01-17 (×5): 30 mg via SUBCUTANEOUS
  Filled 2016-01-15 (×5): qty 0.3

## 2016-01-15 MED ORDER — TRAMADOL HCL 50 MG PO TABS
50.0000 mg | ORAL_TABLET | ORAL | Status: DC | PRN
  Administered 2016-01-17: 100 mg via ORAL
  Filled 2016-01-15: qty 2

## 2016-01-15 NOTE — Progress Notes (Signed)
Foley removed at [phone]

## 2016-01-15 NOTE — Progress Notes (Signed)
Patient stated that he self caths at home and spouse/himself manages ostomy bag. Patient brought own supplies, to manage. Expressed to patient to please notify nurse if any were done so output would be documented.

## 2016-01-15 NOTE — Progress Notes (Addendum)
Patient is alert and oriented on my shift and VS stable. Patient refuses to use bone foam and continues to try to place a pillow under operated leg, patient educated. Continues to in and out cath himself without showing nurse the output, educated. Patient is hard of hearing. Wife at bedside.

## 2016-01-15 NOTE — Clinical Social Work Note (Signed)
CSW received consult for possible SNF placement for STR. PT has recommended Home Health PT.  RNCM is aware. CSW signing off but available for any additional consults.  Santiago Bumpers, MSW, LCSW-A [phone]

## 2016-01-15 NOTE — Anesthesia Postprocedure Evaluation (Signed)
Anesthesia Post Note  Patient: Donald Juarez  Procedure(s) Performed: Procedure(s) (LRB): TOTAL KNEE ARTHROPLASTY (Right)  Patient location during evaluation: Nursing Unit Anesthesia Type: Spinal Level of consciousness: oriented and awake and alert Pain management: pain level controlled Vital Signs Assessment: post-procedure vital signs reviewed and stable Respiratory status: spontaneous breathing, respiratory function stable and patient connected to nasal cannula oxygen Cardiovascular status: blood pressure returned to baseline and stable Postop Assessment: no headache, no backache and patient able to bend at knees Anesthetic complications: no    Last Vitals:  Vitals:   01/15/16 0435 01/15/16 0728  BP: 121/71 118/70  Pulse: (!) 59 61  Resp: 18 18  Temp: 36.9 C 36.8 C    Last Pain:  Vitals:   01/15/16 0959  TempSrc:   PainSc: 4                  Precious Haws Doral Ventrella

## 2016-01-15 NOTE — Progress Notes (Signed)
Physical Therapy Treatment Patient Details Name: Donald Juarez MRN: [id number] DOB: 10-26-1945 Today's Date: 01/15/2016    History of Present Illness Pt admitted for R TKR.    PT Comments    Pt in bed with complaints of general increase in pain today.  Pt unable to lay le flat on bed without towel roll "Put it back, put it back!" Pt participated in supine AAROM for RLE limited by pain.  Supine to edge of bed with seated and standing exercises. Pt stood with min/mod a x 1. Pt with generally poor hand placements and reaching for writers arm to pull up on.  Needed education and cues for safety.  Minimal dizziness upon standing that cleared in a short period of time.  He was able to ambulate around his bed to the recliner with min a x 1 with overall decreased gait quality and increased time.  Self-limiting weight bearing on RLE.  Pt allowed limited knee ROM this am.  Will get measurements during second session today.  Voiced frustration over decreased quality of movement today.       Follow Up Recommendations  Home health PT       Equipment Recommendations  None recommended by PT    Recommendations for Other Services       Precautions / Restrictions Precautions Precautions: Fall;Knee Restrictions Weight Bearing Restrictions: Yes RLE Weight Bearing: Weight bearing as tolerated    Mobility  Bed Mobility Overal bed mobility: Needs Assistance Bed Mobility: Supine to Sit     Supine to sit: Min assist     General bed mobility comments: for LE management  Transfers Overall transfer level: Needs assistance Equipment used: Rolling walker (2 wheeled) Transfers: Sit to/from Stand min/mod A              Ambulation/Gait Ambulation/Gait assistance: Min assist Ambulation Distance (Feet): 15 Feet Assistive device: Rolling walker (2 wheeled) Gait Pattern/deviations: Step-to pattern;Decreased stance time - left;Decreased step length - right   Gait velocity interpretation: <1.8  ft/sec, indicative of risk for recurrent falls General Gait Details: pt reports general decrease in gait quality adn increased pain today.  stated he did not do as well walking this am as yesterday.  voiced frustration.   Stairs            Wheelchair Mobility    Modified Rankin (Stroke Patients Only)       Balance Overall balance assessment: Needs assistance Sitting-balance support: Feet supported Sitting balance-Leahy Scale: Good     Standing balance support: Bilateral upper extremity supported Standing balance-Leahy Scale: Fair                      Cognition Arousal/Alertness: Awake/alert Behavior During Therapy: WFL for tasks assessed/performed Overall Cognitive Status: Within Functional Limits for tasks assessed                      Exercises Total Joint Exercises Ankle Circles/Pumps: AROM;10 reps;Both;Supine Quad Sets: AROM;Right;20 reps;Supine Short Arc Quad: AAROM;Supine;10 reps;Right Heel Slides: AAROM;Supine;20 reps;Right Hip ABduction/ADduction: AAROM;Supine;10 reps;Right Straight Leg Raises: AAROM;Supine;20 reps;Right Long Arc Quad: AAROM;Right;Seated;10 reps Knee Flexion: AAROM;Right;Seated;10 reps Marching in Standing: AROM;Right;Standing;10 reps    General Comments        Pertinent Vitals/Pain Pain Assessment: 0-10 Pain Score: 3  Pain Descriptors / Indicators: Discomfort;Sore Pain Intervention(s): Limited activity within patient's tolerance;Premedicated before session    Home Living  Prior Function            PT Goals (current goals can now be found in the care plan section) Progress towards PT goals: Progressing toward goals    Frequency    BID      PT Plan      Co-evaluation             End of Session Equipment Utilized During Treatment: Gait belt Activity Tolerance: Patient limited by pain Patient left: in chair;with chair alarm set;with family/visitor present      Time: [id number] PT Time Calculation (min) (ACUTE ONLY): 26 min  Charges:  $Gait Training: 8-22 mins $Therapeutic Exercise: 8-22 mins                    G Codes:      [name] 01-22-2016, 10:32 AM

## 2016-01-15 NOTE — Progress Notes (Signed)
CN placed yellow grip socks to patient's spouse, spouse rolling around room in wc with bare feet, she refuses to keep socks on and making transfers. Education given

## 2016-01-15 NOTE — Progress Notes (Signed)
Physical Therapy Treatment Patient Details Name: Donald Juarez MRN: [id number] DOB: 03-27-1945 Today's Date: 01/15/2016    History of Present Illness Pt admitted for R TKR.    PT Comments    Pt in chair sliding forward.  Stood with min a x 1 and poor hand placements and was able to ambulate around bed 15' with min a x 1 and generally unsteady gait.  Seated exercises and ROM with improved ROM tolerance this session.  Returned to supine for supine exercises.  Repositioned for comfort when pt stated he was going to get up to use bathroom alone.  Had just reviewed need to call for staff and he seemed surprised that he was unable to walk to bathroom without assistance.  Nurse called to attend to colostomy and self cath needs in bathroom.  He was able to ambulate to/from and stand for care needs which he managed overall without assist but with poor safety navigating in walker and around bathroom at one point trying to ambulate out of bathroom without walker.  Upon return to bed he sat abruptly on bed and yelled in pain.  When asked if he lost his balance or if he chose to sit quickly he stated "I didn't think it would hurt that much".  Pt voided some feelings of confusion at times today.    Discussed at length home situation and level of outside assistance.  Pt lives alone with wife who seems to have her own extensive medical needs.  She was transferring in the bathroom in room from wheelchair with bare feet and was given education on safety, is unable to physically assist pt in mobility skills and began to vomit during session in room.  He stated SNF is not an option as he needs to return home to attend to her needs as well.  He seems to be her primary caregiver.  Pt did somewhat better during PM session today but overall needs to make significant progress to be safe for discharge to home.  Discussed concerns with pt and wife.     Follow Up Recommendations  Home health PT - depending on progress next  session, SNF may be a more appropriate discharge plan but he is resistant to it at this time.     Equipment Recommendations  None recommended by PT    Recommendations for Other Services       Precautions / Restrictions Precautions Precautions: Fall;Knee Precaution Booklet Issued: No Restrictions Weight Bearing Restrictions: Yes RLE Weight Bearing: Weight bearing as tolerated    Mobility  Bed Mobility Overal bed mobility: Needs Assistance Bed Mobility: Supine to Sit;Sit to Supine     Supine to sit: Min assist     General bed mobility comments: Pt up in chair prior to OT session  Transfers Overall transfer level: Needs assistance Equipment used: Rolling walker (2 wheeled) Transfers: Sit to/from Stand Sit to Stand: Min guard         General transfer comment: continues with poor techniques and safety  Ambulation/Gait Ambulation/Gait assistance: Min assist Ambulation Distance (Feet): 40 Feet Assistive device: Rolling walker (2 wheeled) Gait Pattern/deviations: Step-to pattern;Decreased stance time - left;Decreased step length - right   Gait velocity interpretation: <1.8 ft/sec, indicative of risk for recurrent falls General Gait Details: generally unsafe and high fall risk   Stairs            Wheelchair Mobility    Modified Rankin (Stroke Patients Only)       Balance Overall balance  assessment: Needs assistance Sitting-balance support: Feet supported Sitting balance-Leahy Scale: Good     Standing balance support: Bilateral upper extremity supported Standing balance-Leahy Scale: Fair                      Cognition Arousal/Alertness: Awake/alert Behavior During Therapy: WFL for tasks assessed/performed;Impulsive Overall Cognitive Status: Within Functional Limits for tasks assessed                 General Comments: Pt reported some hallucinations with pain meds.  Wife stated nurse was aware.  He voiced feeling somewhat confused to  place.      Exercises Total Joint Exercises Ankle Circles/Pumps: AROM;10 reps;Both;Supine Quad Sets: AROM;Right;20 reps;Supine Short Arc Quad: AAROM;Supine;10 reps;Right Heel Slides: AAROM;Supine;20 reps;Right Hip ABduction/ADduction: AAROM;Supine;10 reps;Right Straight Leg Raises: AAROM;Supine;20 reps;Right Long Arc Quad: AAROM;Right;Seated;10 reps Knee Flexion: AAROM;Right;Seated;10 reps Goniometric ROM: 10-85 Marching in Standing: AROM;Right;Standing;10 reps    General Comments        Pertinent Vitals/Pain Pain Assessment: 0-10 (Simultaneous filing. User may not have seen previous data.) Pain Score: 3  (Simultaneous filing. User may not have seen previous data.) Pain Location: R knee - does not seem to understand pain scale well.  States worst pain and rates 3/10 then says 12/10 moments later.   (Simultaneous filing. User may not have seen previous data.) Pain Descriptors / Indicators: Discomfort;Sore Pain Intervention(s): Limited activity within patient's tolerance;Ice applied (Simultaneous filing. User may not have seen previous data.)    Home Living Family/patient expects to be discharged to:: Private residence Living Arrangements: Spouse/significant other Available Help at Discharge: Family Type of Home: House Home Access: Level entry   Home Layout: Multi-level;Able to live on main level with bedroom/bathroom Home Equipment: Bedside commode;Adaptive equipment;Cane - single point;Wheelchair - [employer] - 2 wheels      Prior Function Level of Independence: Independent          PT Goals (current goals can now be found in the care plan section) Progress towards PT goals: Progressing toward goals    Frequency    BID      PT Plan Current plan remains appropriate    Co-evaluation             End of Session Equipment Utilized During Treatment: Gait belt Activity Tolerance: Patient limited by pain Patient left: in bed;with call bell/phone within  reach;with family/visitor present;with bed alarm set     Time: 1125-1210 PT Time Calculation (min) (ACUTE ONLY): 45 min  Charges:  $Gait Training: 8-22 mins $Therapeutic Exercise: 8-22 mins $Therapeutic Activity: 8-22 mins                    G Codes:      [name] 01-28-16, 12:34 PM

## 2016-01-15 NOTE — Progress Notes (Signed)
Subjective: 1 Day Post-Op Procedure(s) (LRB): TOTAL KNEE ARTHROPLASTY (Right) Patient reports pain as mild. Moderate with bone foam  Patient is well, and has had no acute complaints or problems Denies any CP, SOB, ABD pain. We will continue therapy today.  Plan is to go Home after hospital stay.  Objective: Vital signs in last 24 hours: Temp:  [97 F (36.1 C)-98.5 F (36.9 C)] 98.2 F (36.8 C) (12/16 0728) Pulse Rate:  [57-70] 61 (12/16 0728) Resp:  [14-20] 18 (12/16 0728) BP: (95-148)/(57-82) 118/70 (12/16 0728) SpO2:  [89 %-100 %] 91 % (12/16 0728) FiO2 (%):  [21 %] 21 % (12/15 1123)  Intake/Output from previous day: 12/15 0701 - 12/16 0700 In: 2896.3 [P.O.:120; I.V.:2676.3; IV Piggyback:100] Out: 1060 [Urine:760; Blood:300] Intake/Output this shift: No intake/output data recorded.   Recent Labs  01/15/16 0410  HGB 11.2*    Recent Labs  01/15/16 0410  WBC 7.6  RBC 3.37*  HCT 31.6*  PLT 173    Recent Labs  01/15/16 0410  NA 132*  K 4.1  CL 103  CO2 25  BUN 15  CREATININE 0.69  GLUCOSE 144*  CALCIUM 8.2*   No results for input(s): LABPT, INR in the last 72 hours.  EXAM General - Patient is Alert, Appropriate and Oriented Extremity - Neurovascular intact Sensation intact distally Intact pulses distally Dorsiflexion/Plantar flexion intact No cellulitis present Compartment soft  Unable to straight leg raise Dressing - dressing C/D/I and no drainage Motor Function - intact, moving foot and toes well on exam.   Past Medical History:  Diagnosis Date  . Anginal pain ([place])    feels this as jaw pain.  takes imdur for this. rarely uses nitro.  [name] Anxiety   . Arthritis   . Asthma   . Bilateral claudication of lower limb ([place])   . Carotid stenosis    a. 07/2013 Carotid U/S: 100% RICA (pt prev reported h/o [id number]), LICA [id number], bilat ECA 50%, normal vertebrals and subclavians bilat-->F/U needed in 01/2014.  . Cataract of left eye   . Cataract, right     a. 10/2010 s/p cataract surgery   . COPD (chronic obstructive pulmonary disease) ([place])   . Coronary artery disease    a. 06/1996 & 03/2005 Cath: non-obs dzs;  b. 07/2009 NSTEMI/Cath: LCX 99->PCI/DES extending into OM1;  c. 01/2013 Cath: patent LCX stent-->Med Rx.; d. cath 03/2014: RPAV dzs->Med rx; e. 10/2014 Cath/PCI: LM 30, LAD 20p/m, LCX 50ost/p, 10 ISR, 60d, OM1 min irregs, OM2 50, RCA 30ost, 50m, 60d, RPDA 70(2.5x8 Xience DES), RPAV 90small(PTCA); f. 10/2014 Relook Cath: RPAV 99(Med Rx), EF 45-50.  [name] Dysrhythmia    since ablation, flutter resolved.  . Essential hypertension   . Hyperkalemia   . Hyperlipidemia   . Myocardial infarction 2015   had stent inserted and then had heart attack after. stent inserted due to blockage  . Paroxysmal atrial flutter ([place])    a. CHA2DS2VASc = 3 -> eliquis;  b. 07/2013 Echo: EF 50-55%, mildly dil LA.c. s/p RFCA 10/31/2013 by Dr Caryl Comes  . Prostate cancer ([place])   . Pulmonary nodule, right    a. 0.7cm RLL - stable by CT 06/2013.  [name] Rectal cancer ([place])    a. Status post colostomy  . Seizures ([place])    a. last 30 years ago  . Shingles    a. 09/2012.  . SSS (sick sinus syndrome) ([place])    a. 12/2009 S/P MDT [place] ADDR01 DC PPM, ser # [id number] ([name]).  [name]  Syncope and collapse     Assessment/Plan:   1 Day Post-Op Procedure(s) (LRB): TOTAL KNEE ARTHROPLASTY (Right) Active Problems:   Primary localized osteoarthritis of right knee  Estimated body mass index is 27.69 kg/m as calculated from the following:   Height as of this encounter: 5\' 10"  (1.778 m).   Weight as of this encounter: 87.5 kg (193 lb). Advance diet Up with therapy  Acute post op blood loss anemia- Hgb 11.2, recheck labs in the am Hyponatremia - dc iv fluids, recheck labs in the am Needs BM CM to assist with discharge    DVT Prophylaxis - Lovenox, Foot Pumps and TED hose Weight-Bearing as tolerated to right leg   T. Rachelle Hora, PA-C [place] 01/15/2016,  8:51 AM

## 2016-01-15 NOTE — Progress Notes (Signed)
Notified rounding Ortho MD of muscle spasms, pain control, and non compliance of bone foam. New orders for tramadol added.

## 2016-01-15 NOTE — Progress Notes (Signed)
Offered prn pain medication, patient refused. Spouse continues to roll around room and unit refusing grip socks, education given on infection prevention.

## 2016-01-15 NOTE — Evaluation (Signed)
Occupational Therapy Evaluation Patient Details Name: Donald Juarez MRN: [id number] DOB: 09-27-1945 Today's Date: 01/15/2016    History of Present Illness Pt admitted for R TKR.   Clinical Impression   70yo male s/p R TKR POD#1 presenting with 10/10 pain, decreased ROM, decreased activity tolerance, and decreased knowledge of AE/DME which limit pt's ability to perform ADL and functional mobility at prior level of functioning. Pt reports being primary caregiver for spouse and performs all household tasks prior to surgery. Pt reports being "well prepared" for return home, having prepared 6 weeks of freezer meals ahead of time. Intermittent family support available. Patient will benefit from skilled OT intervention to address the above impairments and limitations, in order to restore to prior level of function, improve patient safety upon discharge, and to decrease falls risk. Pt verbalized strong desire to return home and does not report any interest in SNF. Recommending HHOT/HHPT pending pt progress while in hospital.     Follow Up Recommendations  Home health OT (HHOT pending pt progress)    Equipment Recommendations       Recommendations for Other Services       Precautions / Restrictions Precautions Precautions: Fall;Knee Precaution Booklet Issued: No Restrictions Weight Bearing Restrictions: Yes RLE Weight Bearing: Weight bearing as tolerated      Mobility Bed Mobility Overal bed mobility: Needs Assistance Bed Mobility: Supine to Sit;Sit to Supine     Supine to sit: Min assist     General bed mobility comments: Pt up in chair prior to OT session  Transfers Overall transfer level: Needs assistance Equipment used: Rolling walker (2 wheeled) Transfers: Sit to/from Stand Sit to Stand: Min guard         General transfer comment: continues with poor techniques and safety    Balance Overall balance assessment: Needs assistance Sitting-balance support: Feet  supported Sitting balance-Leahy Scale: Good     Standing balance support: Bilateral upper extremity supported Standing balance-Leahy Scale: Fair                              ADL Overall ADL's : Needs assistance/impaired                                       General ADL Comments: Due to limited mobility in R knee, LB ADL tasks require some assistance, with AE may be modified indep but declined to trial AE during session     Vision Vision Assessment?: No apparent visual deficits   Perception     Praxis      Pertinent Vitals/Pain Pain Assessment: 0-10 (Simultaneous filing. User may not have seen previous data.) Pain Score: 3  (Simultaneous filing. User may not have seen previous data.) Pain Location: R knee - does not seem to understand pain scale well.  States worst pain and rates 3/10 then says 12/10 moments later.   (Simultaneous filing. User may not have seen previous data.) Pain Descriptors / Indicators: Discomfort;Sore Pain Intervention(s): Limited activity within patient's tolerance;Ice applied (Simultaneous filing. User may not have seen previous data.)     Hand Dominance     Extremity/Trunk Assessment Upper Extremity Assessment Upper Extremity Assessment: Overall WFL for tasks assessed   Lower Extremity Assessment Lower Extremity Assessment: Defer to PT evaluation       Communication Communication Communication: No difficulties   Cognition Arousal/Alertness: Awake/alert  Behavior During Therapy: [facility] for tasks assessed/performed;Impulsive Overall Cognitive Status: Within Functional Limits for tasks assessed                 General Comments: Pt reported some hallucinations with pain meds.  Wife stated nurse was aware.  He voiced feeling somewhat confused to place.     General Comments       Exercises       Shoulder Instructions      Home Living Family/patient expects to be discharged to:: Private residence Living  Arrangements: Spouse/significant other Available Help at Discharge: Family Type of Home: House Home Access: Level entry     Home Layout: Multi-level;Able to live on main level with bedroom/bathroom Alternate Level Stairs-Number of Steps: no steps for basement level of home with back entrance and all needs within reach on basement level   Bathroom Shower/Tub: Walk-in shower;Door   [employer] Toilet: Standard     Home Equipment: Bedside commode;Adaptive equipment;Cane - single point;Wheelchair - [employer] - 2 wheels Adaptive Equipment: Reacher;Long-handled Conservation officer, historic buildings        Prior Functioning/Environment Level of Independence: Independent                 OT Problem List: Decreased range of motion;Decreased safety awareness;Decreased knowledge of use of DME or AE;Decreased activity tolerance   OT Treatment/Interventions: Self-care/ADL training;Energy conservation;DME and/or AE instruction;Patient/family education    OT Goals(Current goals can be found in the care plan section) Acute Rehab OT Goals Patient Stated Goal: to go home OT Goal Formulation: With patient Time For Goal Achievement: 01/29/16 Potential to Achieve Goals: Fair  OT Frequency: Min 1X/week   Barriers to D/C: Decreased caregiver support  Pt is primary caregiver for spouse as well as does all household tasks, intermittent help from family available       Co-evaluation              End of Session    Activity Tolerance: Patient limited by pain Patient left: in chair;with call bell/phone within reach;with chair alarm set;with family/visitor present   Time: [id number] OT Time Calculation (min): 15 min Charges:  OT General Charges $OT Visit: 1 Procedure OT Evaluation $OT Eval Low Complexity: 1 Procedure G-Codes:    Corky Sox, OTR/L 01/15/2016, 12:34 PM

## 2016-01-16 LAB — CBC
HCT: 29.3 % — ABNORMAL LOW (ref 40.0–52.0)
HEMOGLOBIN: 10.6 g/dL — AB (ref 13.0–18.0)
MCH: 33.6 pg (ref 26.0–34.0)
MCHC: 36.2 g/dL — AB (ref 32.0–36.0)
MCV: 92.9 fL (ref 80.0–100.0)
Platelets: 172 10*3/uL (ref 150–440)
RBC: 3.15 MIL/uL — ABNORMAL LOW (ref 4.40–5.90)
RDW: 13.1 % (ref 11.5–14.5)
WBC: 7.6 10*3/uL (ref 3.8–10.6)

## 2016-01-16 LAB — BASIC METABOLIC PANEL
Anion gap: 3 — ABNORMAL LOW (ref 5–15)
BUN: 12 mg/dL (ref 6–20)
CHLORIDE: 102 mmol/L (ref 101–111)
CO2: 28 mmol/L (ref 22–32)
CREATININE: 0.66 mg/dL (ref 0.61–1.24)
Calcium: 8.3 mg/dL — ABNORMAL LOW (ref 8.9–10.3)
GFR calc Af Amer: >60 mL/min (ref >60)
GFR calc non Af Amer: >60 mL/min (ref >60)
GLUCOSE: 117 mg/dL — AB (ref 65–99)
POTASSIUM: 3.7 mmol/L (ref 3.5–5.1)
SODIUM: 133 mmol/L — AB (ref 135–145)

## 2016-01-16 MED ORDER — OXYCODONE HCL 5 MG PO TABS: 5.0000 mg | ORAL_TABLET | ORAL | 0 refills | Status: DC | PRN

## 2016-01-16 MED ORDER — ENOXAPARIN SODIUM 40 MG/0.4ML ~~LOC~~ SOLN: 40.0000 mg | SUBCUTANEOUS | 0 refills | Status: DC

## 2016-01-16 MED ORDER — TRAMADOL HCL 50 MG PO TABS: 50.0000 mg | ORAL_TABLET | ORAL | 0 refills | Status: DC | PRN

## 2016-01-16 NOTE — Progress Notes (Signed)
Subjective: 2 Days Post-Op Procedure(s) (LRB): TOTAL KNEE ARTHROPLASTY (Right) Patient reports pain as 0/10.  Patient is well, and has had no acute complaints or problems Denies any CP, SOB, ABD pain. We will continue therapy today.  Plan is to go Home after hospital stay.  Objective: Vital signs in last 24 hours: Temp:  [98 F (36.7 C)-98.9 F (37.2 C)] 98.9 F (37.2 C) (12/17 0744) Pulse Rate:  [61-80] 80 (12/17 0744) Resp:  [18] 18 (12/17 0744) BP: (111-152)/(58-72) 124/72 (12/17 0744) SpO2:  [90 %-93 %] 92 % (12/17 0744)  Intake/Output from previous day: 12/16 0701 - 12/17 0700 In: 2110 [P.O.:120; I.V.:1990] Out: 1025 [Urine:1025] Intake/Output this shift: No intake/output data recorded.   Recent Labs  01/15/16 0410 01/16/16 0431  HGB 11.2* 10.6*    Recent Labs  01/15/16 0410 01/16/16 0431  WBC 7.6 7.6  RBC 3.37* 3.15*  HCT 31.6* 29.3*  PLT 173 172    Recent Labs  01/15/16 0410 01/16/16 0431  NA 132* 133*  K 4.1 3.7  CL 103 102  CO2 25 28  BUN 15 12  CREATININE 0.69 0.66  GLUCOSE 144* 117*  CALCIUM 8.2* 8.3*   No results for input(s): LABPT, INR in the last 72 hours.  EXAM General - Patient is Alert, Appropriate and Oriented Extremity - Neurovascular intact Sensation intact distally Intact pulses distally Dorsiflexion/Plantar flexion intact No cellulitis present Compartment soft  Unable to straight leg raise Dressing - dressing C/D/I and no drainage Motor Function - intact, moving foot and toes well on exam.   Past Medical History:  Diagnosis Date  . Anginal pain ([place])    feels this as jaw pain.  takes imdur for this. rarely uses nitro.  [name] Anxiety   . Arthritis   . Asthma   . Bilateral claudication of lower limb ([place])   . Carotid stenosis    a. 07/2013 Carotid U/S: 100% RICA (pt prev reported h/o 99991111), LICA [id number], bilat ECA 50%, normal vertebrals and subclavians bilat-->F/U needed in 01/2014.  . Cataract of left eye   .  Cataract, right    a. 10/2010 s/p cataract surgery   . COPD (chronic obstructive pulmonary disease) ([place])   . Coronary artery disease    a. 06/1996 & 03/2005 Cath: non-obs dzs;  b. 07/2009 NSTEMI/Cath: LCX 99->PCI/DES extending into OM1;  c. 01/2013 Cath: patent LCX stent-->Med Rx.; d. cath 03/2014: RPAV dzs->Med rx; e. 10/2014 Cath/PCI: LM 30, LAD 20p/m, LCX 50ost/p, 10 ISR, 60d, OM1 min irregs, OM2 50, RCA 30ost, 42m, 60d, RPDA 70(2.5x8 Xience DES), RPAV 90small(PTCA); f. 10/2014 Relook Cath: RPAV 99(Med Rx), EF 45-50.  [name] Dysrhythmia    since ablation, flutter resolved.  . Essential hypertension   . Hyperkalemia   . Hyperlipidemia   . Myocardial infarction 2015   had stent inserted and then had heart attack after. stent inserted due to blockage  . Paroxysmal atrial flutter ([place])    a. CHA2DS2VASc = 3 -> eliquis;  b. 07/2013 Echo: EF 50-55%, mildly dil LA.c. s/p RFCA 10/31/2013 by Dr Caryl Comes  . Prostate cancer ([place])   . Pulmonary nodule, right    a. 0.7cm RLL - stable by CT 06/2013.  [name] Rectal cancer ([place])    a. Status post colostomy  . Seizures ([place])    a. last 30 years ago  . Shingles    a. 09/2012.  . SSS (sick sinus syndrome) ([place])    a. 12/2009 S/P MDT Adapta ADDR01 DC PPM, ser #  [id number] ([name]).  . Syncope and collapse     Assessment/Plan:   2 Days Post-Op Procedure(s) (LRB): TOTAL KNEE ARTHROPLASTY (Right) Active Problems:   Primary localized osteoarthritis of right knee  Estimated body mass index is 27.69 kg/m as calculated from the following:   Height as of this encounter: 5\' 10"  (1.778 m).   Weight as of this encounter: 87.5 kg (193 lb). Advance diet Up with therapy  Acute post op blood loss anemia- Hgb 10.6, stable Hyponatremia - Trending up, Na 133 CM to assist with discharge, Home with HHPT tomrorow     DVT Prophylaxis - Lovenox, Foot Pumps and TED hose Weight-Bearing as tolerated to right leg   T. Rachelle Hora, PA-C [place] 01/16/2016, 8:56 AM

## 2016-01-16 NOTE — Progress Notes (Signed)
Physical Therapy Treatment Patient Details Name: Donald Juarez MRN: [id number] DOB: 17-Oct-1945 Today's Date: 01/16/2016    History of Present Illness Pt admitted for R TKR.    PT Comments    Pt c/o poor sleep last night.  Reported using bone foam last night.  Pt with overall improved extension and less hamstring discomfort during session today.  Participated in exercises as described below.  To edge of bed with min guard.  Stood with min a x 1.  Pt continues with poor hand placements.  He was able to increase his ambulation distance to 63' today with slightly improved gait quality.  He remains at an increased fall risk.  PT encouraged to ambulate with staff to/from bathroom as needed today.   Will need stair training prior to d/c.   Follow Up Recommendations  Home health PT - Pt continues to decline SNF for rehab.  Pt continues to have decreased mobility skills and SNF may be more appropriate depending on progress tomorrow.       Equipment Recommendations  None recommended by PT    Recommendations for Other Services       Precautions / Restrictions Precautions Precautions: Fall;Knee Precaution Booklet Issued: No Restrictions Weight Bearing Restrictions: Yes RLE Weight Bearing: Weight bearing as tolerated    Mobility  Bed Mobility Overal bed mobility: Needs Assistance Bed Mobility: Supine to Sit;Sit to Supine     Supine to sit: Min assist        Transfers Overall transfer level: Needs assistance Equipment used: Rolling walker (2 wheeled) Transfers: Sit to/from Stand Sit to Stand: Min assist         General transfer comment: continues with poor techniques and safety  Ambulation/Gait Ambulation/Gait assistance: Min guard;Min assist Ambulation Distance (Feet): 70 Feet Assistive device: Rolling walker (2 wheeled)     Gait velocity interpretation: <1.8 ft/sec, indicative of risk for recurrent falls General Gait Details: improved gait today.  short steps continue  with occasional imbalances but pt able to recover without assist.   Stairs            Wheelchair Mobility    Modified Rankin (Stroke Patients Only)       Balance Overall balance assessment: Needs assistance Sitting-balance support: Feet supported Sitting balance-Leahy Scale: Good     Standing balance support: Bilateral upper extremity supported Standing balance-Leahy Scale: Fair                      Cognition Arousal/Alertness: Awake/alert Behavior During Therapy: WFL for tasks assessed/performed;Impulsive Overall Cognitive Status: Within Functional Limits for tasks assessed                 General Comments: Better cognition today.    Exercises Total Joint Exercises Ankle Circles/Pumps: AROM;10 reps;Both;Supine Quad Sets: AROM;Right;Supine;10 reps Heel Slides: AAROM;Supine;Right;10 reps Hip ABduction/ADduction: AAROM;Supine;10 reps;Right Straight Leg Raises: AAROM;Supine;Right;10 reps Long Arc Quad: AAROM;Right;Seated;10 reps Knee Flexion: AAROM;Right;Seated;10 reps Goniometric ROM: 5-70  tolerated less flexion today    General Comments        Pertinent Vitals/Pain Pain Assessment: 0-10 Pain Score: 6  Pain Location: R knee Pain Descriptors / Indicators: Discomfort;Sore Pain Intervention(s): Ice applied;Limited activity within patient's tolerance;Monitored during session    Home Living                      Prior Function            PT Goals (current goals can now be found in  the care plan section) Progress towards PT goals: Progressing toward goals    Frequency    BID      PT Plan Current plan remains appropriate    Co-evaluation             End of Session Equipment Utilized During Treatment: Gait belt Activity Tolerance: Patient limited by pain Patient left: in bed;with call bell/phone within reach;with family/visitor present;with bed alarm set     Time: 1005-1028 PT Time Calculation (min) (ACUTE ONLY):  23 min  Charges:  $Gait Training: 8-22 mins $Therapeutic Activity: 8-22 mins                    G Codes:      [name] 01-27-2016, 10:49 AM

## 2016-01-16 NOTE — Discharge Instructions (Signed)

## 2016-01-16 NOTE — Discharge Summary (Signed)
Physician Discharge Summary  Patient ID: Donald Juarez MRN: [id number] DOB/AGE: 1945/08/13 70 y.o.  Admit date: 01/14/2016 Discharge date: 01/16/2016  Admission Diagnoses:  primary osteoarthritis of right knee   Discharge Diagnoses: Patient Active Problem List   Diagnosis Date Noted  . Primary localized osteoarthritis of right knee 01/14/2016  . Essential hypertension   . Hyperlipidemia   . SSS (sick sinus syndrome) ([place])   . Unstable angina ([place]) 11/03/2014  . NSTEMI (non-ST elevated myocardial infarction) ([place])   . Effort angina ([place]) 11/02/2014  . S/P PTCA (percutaneous transluminal coronary angioplasty) 11/02/2014  . Coronary artery disease involving native coronary artery   . Prostate cancer ([place])   . Anxiety and depression 06/18/2014  . Fatigue 11/14/2013  . Typical atrial flutter ([place]) 10/31/2013  . Bilateral claudication of lower limb ([place])   . Coronary artery disease   . Paroxysmal atrial flutter ([place])   . Hypertension   . Carotid stenosis   . COPD, severe GOLD GRADE D 03/28/2013  . Solitary pulmonary nodule 03/28/2013    Past Medical History:  Diagnosis Date  . Anginal pain ([place])    feels this as jaw pain.  takes imdur for this. rarely uses nitro.  [name] Anxiety   . Arthritis   . Asthma   . Bilateral claudication of lower limb ([place])   . Carotid stenosis    a. 07/2013 Carotid U/S: 100% RICA (pt prev reported h/o 99991111), LICA [id number], bilat ECA 50%, normal vertebrals and subclavians bilat-->F/U needed in 01/2014.  . Cataract of left eye   . Cataract, right    a. 10/2010 s/p cataract surgery   . COPD (chronic obstructive pulmonary disease) ([place])   . Coronary artery disease    a. 06/1996 & 03/2005 Cath: non-obs dzs;  b. 07/2009 NSTEMI/Cath: LCX 99->PCI/DES extending into OM1;  c. 01/2013 Cath: patent LCX stent-->Med Rx.; d. cath 03/2014: RPAV dzs->Med rx; e. 10/2014 Cath/PCI: LM 30, LAD 20p/m, LCX 50ost/p, 10 ISR, 60d, OM1 min irregs, OM2 50, RCA 30ost, 69m, 60d, RPDA  70(2.5x8 Xience DES), RPAV 90small(PTCA); f. 10/2014 Relook Cath: RPAV 99(Med Rx), EF 45-50.  [name] Dysrhythmia    since ablation, flutter resolved.  . Essential hypertension   . Hyperkalemia   . Hyperlipidemia   . Myocardial infarction 2015   had stent inserted and then had heart attack after. stent inserted due to blockage  . Paroxysmal atrial flutter ([place])    a. CHA2DS2VASc = 3 -> eliquis;  b. 07/2013 Echo: EF 50-55%, mildly dil LA.c. s/p RFCA 10/31/2013 by Dr Caryl Comes  . Prostate cancer ([place])   . Pulmonary nodule, right    a. 0.7cm RLL - stable by CT 06/2013.  [name] Rectal cancer ([place])    a. Status post colostomy  . Seizures ([place])    a. last 30 years ago  . Shingles    a. 09/2012.  . SSS (sick sinus syndrome) ([place])    a. 12/2009 S/P MDT Adapta ADDR01 DC PPM, ser # [id number] ([name]).  . Syncope and collapse      Transfusion: none   Consultants (if any):   Discharged Condition: Improved  Hospital Course: Donald Juarez is an 70 y.o. male who was admitted 01/14/2016 with a diagnosis of right knee osteoarthritis and went to the operating room on 01/14/2016 and underwent the above named procedures.    Surgeries: Procedure(s): TOTAL KNEE ARTHROPLASTY on 01/14/2016 Patient tolerated the surgery well. Taken to PACU where she was stabilized and then transferred to the orthopedic  floor.  Started on Lovenox 30 q 12 hrs. Foot pumps applied bilaterally at 80 mm. Heels elevated on bed with rolled towels. No evidence of DVT. Negative [place]. Physical therapy started on day #1 for gait training and transfer. OT started day #1 for ADL and assisted devices.  Patient's foley was d/c on day #1. Patient's IV and hemovac was d/c on day #2.  On post op day #3 patient was stable and ready for discharge to home with HHPT.  Implants: Medacta GMK sphere 4+ femur 4 tibia short stem 10 mm insert 3 patella, all components cemented  He was given perioperative antibiotics:  Anti-infectives    Start      Dose/Rate Route Frequency Ordered Stop   01/14/16 1400  ceFAZolin (ANCEF) IVPB 2g/100 mL premix     2 g 200 mL/hr over 30 Minutes Intravenous Every 6 hours 01/14/16 1122 01/14/16 2113   01/14/16 0605  ceFAZolin (ANCEF) 2-4 GM/100ML-% IVPB    Comments:  KENNEDY, DENISE: cabinet override      01/14/16 0605 01/14/16 0748   01/14/16 0215  ceFAZolin (ANCEF) IVPB 2g/100 mL premix     2 g 200 mL/hr over 30 Minutes Intravenous  Once 01/14/16 0204 01/14/16 0748    .  He was given sequential compression devices, early ambulation, and Lovenox for DVT prophylaxis.  He benefited maximally from the hospital stay and there were no complications.    Recent vital signs:  Vitals:   01/16/16 0446 01/16/16 0744  BP: (!) 111/58 124/72  Pulse: 65 80  Resp: 18 18  Temp: 98.2 F (36.8 C) 98.9 F (37.2 C)    Recent laboratory studies:  Lab Results  Component Value Date   HGB 10.6 (L) 01/16/2016   HGB 11.2 (L) 01/15/2016   HGB 15.1 12/29/2015   Lab Results  Component Value Date   WBC 7.6 01/16/2016   PLT 172 01/16/2016   Lab Results  Component Value Date   INR 0.93 12/29/2015   Lab Results  Component Value Date   NA 133 (L) 01/16/2016   K 3.7 01/16/2016   CL 102 01/16/2016   CO2 28 01/16/2016   BUN 12 01/16/2016   CREATININE 0.66 01/16/2016   GLUCOSE 117 (H) 01/16/2016    Discharge Medications:   Allergies as of 01/16/2016   No Known Allergies     Medication List    STOP taking these medications   clopidogrel 75 MG tablet Commonly known as:  PLAVIX     TAKE these medications   albuterol (2.5 MG/3ML) 0.083% nebulizer solution Commonly known as:  PROVENTIL Take 3 mLs (2.5 mg total) by nebulization every 6 (six) hours as needed for wheezing or shortness of breath. DX code 493.20 What changed:  additional instructions   albuterol 108 (90 Base) MCG/ACT inhaler Commonly known as:  PROAIR HFA Inhale 2 puffs into the lungs every 6 (six) hours as needed for wheezing or  shortness of breath. What changed:  Another medication with the same name was changed. Make sure you understand how and when to take each.   amLODipine 2.5 MG tablet Commonly known as:  NORVASC Take 2.5 mg by mouth daily.   arformoterol 15 MCG/2ML Nebu Commonly known as:  BROVANA Take 15 mcg by nebulization 2 (two) times daily. Take 2 mLs (15 mcg total) by nebulization 2 (two) times daily. DX code 493.20 Every morning and 1800   aspirin EC 81 MG tablet Take 81 mg by mouth daily.   atorvastatin 40  MG tablet Commonly known as:  LIPITOR Take 40 mg by mouth at bedtime. After 1900   azithromycin 250 MG tablet Commonly known as:  ZITHROMAX TAKE ONE (1) TABLET EACH DAY   budesonide 0.5 MG/2ML nebulizer solution Commonly known as:  PULMICORT Take 2 mLs (0.5 mg total) by nebulization 2 (two) times daily.   degarelix 120 MG injection Commonly known as:  FIRMAGON Inject into the skin every 3 (three) months.   enoxaparin 40 MG/0.4ML injection Commonly known as:  LOVENOX Inject 0.4 mLs (40 mg total) into the skin daily.   guaiFENesin-codeine 100-10 MG/5ML syrup Take 5 mLs by mouth every 4 (four) hours as needed for cough.   isosorbide mononitrate 120 MG 24 hr tablet Commonly known as:  IMDUR Take one tablet (120 mg) by mouth at bedtime What changed:  how much to take  how to take this  when to take this  additional instructions   metoprolol tartrate 25 MG tablet Commonly known as:  LOPRESSOR Take one tablet (25 mg) in the morning and two tablets (50 mg) in the evening What changed:  how much to take  how to take this  when to take this  additional instructions   nitroGLYCERIN 0.4 MG SL tablet Commonly known as:  NITROSTAT Place 1 tablet (0.4 mg total) under the tongue every 5 (five) minutes as needed for chest pain.   NYQUIL PO Take 15 mLs by mouth 3 (three) times daily as needed (for cought).   nystatin cream Commonly known as:  MYCOSTATIN Apply 1  application topically 2 (two) times daily as needed. Rash/skin irritation.   oxyCODONE 5 MG immediate release tablet Commonly known as:  Oxy IR/ROXICODONE Take 1-2 tablets (5-10 mg total) by mouth every 3 (three) hours as needed for breakthrough pain.   PARoxetine 10 MG tablet Commonly known as:  PAXIL Take 10 mg by mouth daily.   traMADol 50 MG tablet Commonly known as:  ULTRAM Take 50 mg by mouth 3 (three) times daily as needed for severe pain (for pain  in knee and back. takes rarely). What changed:  Another medication with the same name was added. Make sure you understand how and when to take each.   traMADol 50 MG tablet Commonly known as:  ULTRAM Take 1-2 tablets (50-100 mg total) by mouth every 4 (four) hours as needed for moderate pain. What changed:  You were already taking a medication with the same name, and this prescription was added. Make sure you understand how and when to take each.   umeclidinium bromide 62.5 MCG/INH Aepb Commonly known as:  INCRUSE ELLIPTA Inhale 1 puff into the lungs daily. What changed:  when to take this   Vitamin D 2000 units tablet Take 2,000 Units by mouth daily.            Durable Medical Equipment        Start     Ordered   01/14/16 1123  DME Walker rolling  Once    Question:  Patient needs a walker to treat with the following condition  Answer:  Status post total knee replacement using cement, right   01/14/16 1122   01/14/16 1123  DME 3 n 1  Once     01/14/16 1122   01/14/16 1123  DME Bedside commode  Once    Question:  Patient needs a bedside commode to treat with the following condition  Answer:  Status post total knee replacement using cement, right   01/14/16 1122  Diagnostic Studies: Dg Knee 1-2 Views Right  Result Date: 01/14/2016 CLINICAL DATA:  Postop knee pain.  Knee replacement EXAM: RIGHT KNEE - 1-2 VIEW COMPARISON:  CT knee 12/11/2015 FINDINGS: Total knee replacement in satisfactory position. There  appears to be widening of the lateral joint space compared to the medial joint space on the AP view. Skin staples anteriorly. Gas in the knee joint. Small effusion. Extensive arterial calcification IMPRESSION: Total knee replacement. Negative for fracture. There appears to be widening of the lateral joint space. Electronically Signed   By: Franchot Gallo M.D.   On: 01/14/2016 10:25    Disposition: 07-Left Against Medical Advice/Left Without Being Seen/Elopement    Follow-up Information    MENZ,MICHAEL, MD Follow up in 2 week(s).   Specialty:  Orthopedic Surgery Why:  for staple removal and steri strip application Contact information: [address] [place] [address] [phone]            Signed: JESSIAH, HIPPLE [facility] 01/16/2016, 9:03 AM

## 2016-01-17 NOTE — Progress Notes (Signed)
Pt being discharged home today, PIV removed. Discharge instructions reviewed, all questions answered. Prescriptions given to pt to have filled. He will follow up in office to have staples removed. Dressing changed prior to discharge. He is leaving with all his belongings, will be transported home via family member.

## 2016-01-17 NOTE — Progress Notes (Signed)
Subjective: 3 Days Post-Op Procedure(s) (LRB): TOTAL KNEE ARTHROPLASTY (Right) Patient reports pain as 8/10. Tramadol at 8 am, no pain meds in 12 hrs. Patient is well, and has had no acute complaints or problems Denies any CP, SOB, ABD pain. We will continue therapy today.  Plan is to go Home after hospital stay. Patient refuses SNF  Objective: Vital signs in last 24 hours: Temp:  [98.3 F (36.8 C)-99.4 F (37.4 C)] 98.3 F (36.8 C) (12/18 0409) Pulse Rate:  [65-123] 65 (12/18 0409) Resp:  [18] 18 (12/18 0409) BP: (120-141)/(48-59) 120/48 (12/18 0409) SpO2:  [92 %-96 %] 93 % (12/18 0409)  Intake/Output from previous day: 12/17 0701 - 12/18 0700 In: 360 [P.O.:360] Out: 900 [Urine:900] Intake/Output this shift: No intake/output data recorded.   Recent Labs  01/15/16 0410 01/16/16 0431  HGB 11.2* 10.6*    Recent Labs  01/15/16 0410 01/16/16 0431  WBC 7.6 7.6  RBC 3.37* 3.15*  HCT 31.6* 29.3*  PLT 173 172    Recent Labs  01/15/16 0410 01/16/16 0431  NA 132* 133*  K 4.1 3.7  CL 103 102  CO2 25 28  BUN 15 12  CREATININE 0.69 0.66  GLUCOSE 144* 117*  CALCIUM 8.2* 8.3*   No results for input(s): LABPT, INR in the last 72 hours.  EXAM General - Patient is Alert, Appropriate and Oriented Extremity - Neurovascular intact Sensation intact distally Intact pulses distally Dorsiflexion/Plantar flexion intact No cellulitis present Compartment soft  Unable to straight leg raise Dressing - dressing C/D/I and scant drainage Motor Function - intact, moving foot and toes well on exam.   Past Medical History:  Diagnosis Date  . Anginal pain ([place])    feels this as jaw pain.  takes imdur for this. rarely uses nitro.  [name] Anxiety   . Arthritis   . Asthma   . Bilateral claudication of lower limb ([place])   . Carotid stenosis    a. 07/2013 Carotid U/S: 100% RICA (pt prev reported h/o [id number]), LICA [id number], bilat ECA 50%, normal vertebrals and subclavians bilat-->F/U  needed in 01/2014.  . Cataract of left eye   . Cataract, right    a. 10/2010 s/p cataract surgery   . COPD (chronic obstructive pulmonary disease) ([place])   . Coronary artery disease    a. 06/1996 & 03/2005 Cath: non-obs dzs;  b. 07/2009 NSTEMI/Cath: LCX 99->PCI/DES extending into OM1;  c. 01/2013 Cath: patent LCX stent-->Med Rx.; d. cath 03/2014: RPAV dzs->Med rx; e. 10/2014 Cath/PCI: LM 30, LAD 20p/m, LCX 50ost/p, 10 ISR, 60d, OM1 min irregs, OM2 50, RCA 30ost, 50m, 60d, RPDA 70(2.5x8 Xience DES), RPAV 90small(PTCA); f. 10/2014 Relook Cath: RPAV 99(Med Rx), EF 45-50.  [name] Dysrhythmia    since ablation, flutter resolved.  . Essential hypertension   . Hyperkalemia   . Hyperlipidemia   . Myocardial infarction 2015   had stent inserted and then had heart attack after. stent inserted due to blockage  . Paroxysmal atrial flutter ([place])    a. CHA2DS2VASc = 3 -> eliquis;  b. 07/2013 Echo: EF 50-55%, mildly dil LA.c. s/p RFCA 10/31/2013 by Dr Caryl Comes  . Prostate cancer ([place])   . Pulmonary nodule, right    a. 0.7cm RLL - stable by CT 06/2013.  [name] Rectal cancer ([place])    a. Status post colostomy  . Seizures ([place])    a. last 30 years ago  . Shingles    a. 09/2012.  . SSS (sick sinus syndrome) ([place])  a. 12/2009 S/P MDT Adapta ADDR01 DC PPM, ser # [id number] ([name]).  . Syncope and collapse     Assessment/Plan:   3 Days Post-Op Procedure(s) (LRB): TOTAL KNEE ARTHROPLASTY (Right) Active Problems:   Primary localized osteoarthritis of right knee  Estimated body mass index is 27.69 kg/m as calculated from the following:   Height as of this encounter: 5\' 10"  (1.778 m).   Weight as of this encounter: 87.5 kg (193 lb). Advance diet Up with therapy  Acute post op blood loss anemia- Hgb 10.6, stable Discharge home with HHPT today pending progress with PT Follow up with [facility] in 2 weeks     DVT Prophylaxis - Lovenox, Foot Pumps and TED hose Weight-Bearing as tolerated to right leg   T. Rachelle Hora, PA-C [place] 01/17/2016, 8:24 AM

## 2016-01-17 NOTE — Progress Notes (Signed)
Physical Therapy Treatment Patient Details Name: Donald Juarez MRN: [id number] DOB: May 25, 1945 Today's Date: 01/17/2016    History of Present Illness Pt admitted for R TKR.    PT Comments    Pt agreeable to PT and eager to go home. Pt progressing all range of right knee, bed mobility, transfers and ambulation. Education on self assisting Right lower extremity over edge of bed with good demonstration. Ambulates with good fluidity and no loss of balance. Pt education on all exercises and carry over to home with stretch and activity progression. Pt ready to discharge home with HHPT.   Follow Up Recommendations  Home health PT     Equipment Recommendations  None recommended by PT    Recommendations for Other Services       Precautions / Restrictions Precautions Precautions: Fall;Knee Restrictions Weight Bearing Restrictions: Yes RLE Weight Bearing: Weight bearing as tolerated    Mobility  Bed Mobility Overal bed mobility: Modified Independent Bed Mobility: Supine to Sit     Supine to sit: Modified independent (Device/Increase time)     General bed mobility comments: Mild increased time; pt educated on hook techniwque to self assist RLE over the edge of the bed to the floor  Transfers                    Ambulation/Gait Ambulation/Gait assistance: Supervision Ambulation Distance (Feet): 170 Feet Assistive device: Rolling walker (2 wheeled) Gait Pattern/deviations: Step-through pattern   Gait velocity interpretation: Below normal speed for age/gender General Gait Details: fluid cadence; no LOB. good weight shift to RLE   Science writer    Modified Rankin (Stroke Patients Only)       Balance Overall balance assessment: Needs assistance Sitting-balance support: Feet supported Sitting balance-Leahy Scale: Normal     Standing balance support: Bilateral upper extremity supported Standing balance-Leahy Scale: Fair                      Cognition Arousal/Alertness: Awake/alert Behavior During Therapy: WFL for tasks assessed/performed;Impulsive Overall Cognitive Status: Within Functional Limits for tasks assessed                      Exercises Total Joint Exercises Quad Sets: Strengthening;Right;20 reps;Seated (RLE in gravity assisted knee extension) Knee Flexion: AROM;Right;10 reps;Seated (3 positions each rep with 10 sec hold each) Goniometric ROM: 5 - 86    General Comments        Pertinent Vitals/Pain Pain Assessment: 0-10 Pain Score: 8  Pain Location: R knee Pain Intervention(s): Limited activity within patient's tolerance;Monitored during session;Repositioned;Patient requesting pain meds-RN notified;Ice applied    Home Living                      Prior Function            PT Goals (current goals can now be found in the care plan section)      Frequency    BID      PT Plan Current plan remains appropriate    Co-evaluation             End of Session Equipment Utilized During Treatment: Gait belt Activity Tolerance: Patient tolerated treatment well Patient left: in bed;with call bell/phone within reach;with family/visitor present;Other (comment) (polar care in place; did not wish alarm)     Time: [id number] PT Time Calculation (min) (ACUTE ONLY): 33 min  Charges:  $Gait Training: 8-22 mins $Therapeutic Exercise: 8-22 mins                    G Codes:      Larae Grooms, PTA 01/17/2016, 11:17 AM

## 2016-01-17 NOTE — Progress Notes (Signed)
Bone foam has been put on pt. Multiple times and pt. Keeps removing it. Pt. Is now refusing to let me put it on.

## 2016-01-19 NOTE — H&P (Signed)
Lauris Poag, MD - 12/27/2015 3:15 PM EST Formatting of this note may be different from the original. Chief Complaint  Patient presents with  . Follow-up  H&P R TKA 01/14/16.   History of Present Illness: Donald Juarez is a 70 y.o. male who comes to clinic today for history and physical for right total knee replacement with surgery scheduled for 01/14/2016. The patient has exhausted nonoperative treatment. He has developed debilitating pain. He has been having episodes where the knee gives way on him. This happened this past Thanksgiving while he was carrying a dish.  Past Medical History: Past Medical History:  Diagnosis Date  . Arthritis  Bilat knees  . Atrial fibrillation , unspecified (CMS-HCC)  w/ intermittent aflutter  . Bladder outlet obstruction  w/ elevated PSA  . CAD (coronary artery disease)  . Chronic airway obstruction, not elsewhere classified , unspecified (CMS-HCC)  . Chronic obstructive asthma, unspecified (CMS-HCC)  . Colon cancer (CMS-HCC)  . Depression with anxiety  . HLD (hyperlipidemia), unspecified  . HTN (hypertension)  . Myocardial infarction 1998 & 2011  . Neuralgia, neuritis, and radiculitis, unspecified  . Other and unspecified angina pectoris (CMS-HCC)  . Pancreatitis  . Pure hypercholesterolemia  . Restless leg syndrome  . Seasonal allergic rhinitis  . SVT (supraventricular tachycardia) (CMS-HCC)   Past Surgical History: Past Surgical History:  Procedure Laterality Date  . catheter ablation 10/2013  for Atrial flutter  . CHOLECYSTECTOMY  . COLON SURGERY 2003  Resection w/ ostomy  . CORONARY ANGIOPLASTY 07/11  w/ DES to Rt proximal circ  . KNEE ARTHROSCOPY Right   Past Family History: Family History  Problem Relation Age of Onset  . COPD Mother  . Myocardial Infarction (Heart attack) Father   Medications: Current Outpatient Prescriptions Ordered in Epic  Medication Sig Dispense Refill  . albuterol (PROVENTIL) 2.5 mg /3 mL  (0.083 %) nebulizer solution USE 1 VIAL VIA BY NEBULIZER EVERY 6 HOURS AS NEEDED FOR WHEEZING OR SHORTNESS OF BREATH 360 mL 0  . albuterol 90 mcg/actuation inhaler Inhale 2 inhalations into the lungs every 6 (six) hours as needed for Wheezing.  [name] amLODIPine (NORVASC) 2.5 MG tablet Take 2.5 mg by mouth once daily.   [name] arformoterol (BROVANA) 15 mcg/2 mL nebulizer solution Take 15 mcg by nebulization every 12 (twelve) hours.  [name] aspirin 81 MG EC tablet Take 1 tablet by mouth once daily.  [name] atorvastatin (LIPITOR) 40 MG tablet TAKE ONE TABLET EVERY EVENING 90 tablet 3  . azithromycin (ZITHROMAX) 250 MG tablet Take 250 mg by mouth once daily. Take 2 tablets (500mg ) by mouth on Day 1. Take 1 tablet (250mg ) by mouth on Days 2-5.  . blood glucose diagnostic (ONETOUCH ULTRA TEST) test strip Use once daily as needed. Use as instructed.  . budesonide (PULMICORT) 0.25 mg/2 mL nebulizer solution Take 0.25 mg by nebulization 2 (two) times daily.  . cholecalciferol (VITAMIN D3) 2,000 unit capsule Take 2,000 Units by mouth once daily.  . degarelix (FIRMAGON) 120 mg injection Inject 240 mg subcutaneously once. Follow package directions.  Keenan Bachelor ELLIPTA 62.5 mcg/actuation DsDv inhalation unit Inhale 1 inhalation into the lungs once daily.  . isosorbide mononitrate (IMDUR) 60 MG ER tablet Take 60 mg by mouth once daily.  . metoprolol tartrate (LOPRESSOR) 25 MG tablet Take 25 mg by mouth 3 (three) times daily.  . nitroGLYcerin (NITROSTAT) 0.4 MG SL tablet Place 0.4 mg under the tongue every 5 (five) minutes as needed for Chest pain. May take  up to 3 doses.  [name] nystatin (MYCOSTATIN) 100,000 unit/gram ointment Apply topically 2 (two) times daily. 30 g 5  . PARoxetine (PAXIL) 10 MG tablet Take 1 tablet (10 mg total) by mouth once daily. 30 tablet 3  . traMADol (ULTRAM) 50 mg tablet Take 1 tablet (50 mg total) by mouth every 8 (eight) hours as needed. for Pain 60 tablet 0   No current Epic-ordered facility-administered  medications on file.   Allergies: No Known Allergies   Body mass index is 28.97 kg/m.  Review of Systems:  A comprehensive 14 point ROS was performed, reviewed, and the pertinent orthopaedic findings are documented in the HPI.  Physical Exam: General/Constitutional: No apparent distress: well-nourished and well developed. Eyes: Pupils equal, round with synchronous movement. Lungs: Clear to auscultation HEENT: Normal Vascular: No edema, swelling or tenderness, except as noted in detailed exam. Cardiac: Heart rate and rhythm is regular. Integumentary: No impressive skin lesions present, except as noted in detailed exam. Neuro/Psych: Normal mood and affect, oriented to person, place and time.  Vitals:  12/27/15 1521  BP: 120/70   Orthopaedic Examination: Right Left   Gait Normal  Alignment Normal  Inspection Normal Normal  Palpation Knee Normal Normal  Range of Motion Knee 5-115 degrees Normal  Strength Normal Normal  Meniscus Exam Normal Normal  Ligament Exam Varus deformity is not passively correctable Normal  Patella Exam Normal Normal  Reflexes Normal Normal  Neurologic Trace dorsalis pedis and posterior tibial pulses. Sensation is intact Normal   Imaging Studies: Prior x-rays from 10/26/2015 show severe osteoarthritis with posterior spurs. There is complete loss of the medial joint space and bone erosion. There is chondrocalcinosis throughout as well as calcified vessels.  Assessment:  ICD-10-CM ICD-9-CM  1. Primary osteoarthritis of right knee M17.11 715.16    Plan: The findings of today's exam were discussed with the patient and family which include:  The patient has clinical findings of severe osteoarthritis of the right knee.   He is scheduled for right total knee replacement on 01/14/2016.  The patient will call with any questions or concerns that may arise.  Scribe Attestation: I, Candie Mile, am acting as scribe for [employer],  MD.

## 2016-01-20 ENCOUNTER — Other Ambulatory Visit: Payer: Self-pay | Admitting: Internal Medicine

## 2016-02-01 LAB — CUP PACEART REMOTE DEVICE CHECK
Battery Remaining Longevity: 11 mo
Brady Statistic AS VP Percent: 3 %
Date Time Interrogation Session: 20171206165102
Implantable Lead Implant Date: 20111219
Implantable Lead Location: 753859
Implantable Lead Location: 753860
Implantable Pulse Generator Implant Date: 20111219
Lead Channel Pacing Threshold Amplitude: 0.5 V
Lead Channel Pacing Threshold Pulse Width: 0.4 ms
MDC IDC LEAD IMPLANT DT: 20111219
MDC IDC MSMT BATTERY IMPEDANCE: 1621 Ohm
MDC IDC MSMT BATTERY VOLTAGE: 2.71 V
MDC IDC MSMT LEADCHNL RA IMPEDANCE VALUE: 462 Ohm
MDC IDC MSMT LEADCHNL RV IMPEDANCE VALUE: 570 Ohm
MDC IDC SET LEADCHNL RA PACING AMPLITUDE: 4.5 V
MDC IDC SET LEADCHNL RV PACING AMPLITUDE: 2 V
MDC IDC SET LEADCHNL RV PACING PULSEWIDTH: 0.4 ms
MDC IDC SET LEADCHNL RV SENSING SENSITIVITY: 2 mV
MDC IDC STAT BRADY AP VP PERCENT: 12 %
MDC IDC STAT BRADY AP VS PERCENT: 71 %
MDC IDC STAT BRADY AS VS PERCENT: 14 %

## 2016-02-03 ENCOUNTER — Other Ambulatory Visit: Payer: Self-pay | Admitting: Cardiology

## 2016-03-03 ENCOUNTER — Other Ambulatory Visit: Payer: Self-pay | Admitting: Internal Medicine

## 2016-03-23 ENCOUNTER — Other Ambulatory Visit: Payer: Self-pay

## 2016-03-23 MED ORDER — ISOSORBIDE MONONITRATE ER 120 MG PO TB24: ORAL_TABLET | ORAL | Status: DC

## 2016-03-23 NOTE — Telephone Encounter (Signed)
Requested Prescriptions   Signed Prescriptions Disp Refills  . isosorbide mononitrate (IMDUR) 120 MG 24 hr tablet      Sig: Take one tablet (120 mg) by mouth at bedtime    Authorizing Provider: [name]    Ordering User: Janan Ridge

## 2016-03-24 ENCOUNTER — Ambulatory Visit: Payer: Medicare Other | Admitting: Internal Medicine

## 2016-03-28 ENCOUNTER — Encounter: Payer: Self-pay | Admitting: Internal Medicine

## 2016-03-28 ENCOUNTER — Ambulatory Visit (INDEPENDENT_AMBULATORY_CARE_PROVIDER_SITE_OTHER): Payer: Medicare Other | Admitting: Internal Medicine

## 2016-03-28 ENCOUNTER — Other Ambulatory Visit: Payer: Self-pay

## 2016-03-28 VITALS — BP 142/80 | HR 135 | Ht 69.0 in | Wt 206.8 lb

## 2016-03-28 DIAGNOSIS — I493 Ventricular premature depolarization: Secondary | ICD-10-CM | POA: Diagnosis not present

## 2016-03-28 DIAGNOSIS — Z950 Presence of cardiac pacemaker: Secondary | ICD-10-CM | POA: Diagnosis not present

## 2016-03-28 DIAGNOSIS — I495 Sick sinus syndrome: Secondary | ICD-10-CM | POA: Diagnosis not present

## 2016-03-28 DIAGNOSIS — I5030 Unspecified diastolic (congestive) heart failure: Secondary | ICD-10-CM | POA: Diagnosis not present

## 2016-03-28 DIAGNOSIS — I4892 Unspecified atrial flutter: Secondary | ICD-10-CM | POA: Diagnosis not present

## 2016-03-28 LAB — CUP PACEART INCLINIC DEVICE CHECK
Battery Impedance: 1909 Ohm
Brady Statistic AP VP Percent: 9 %
Brady Statistic AP VS Percent: 76 %
Brady Statistic AS VP Percent: 2 %
Brady Statistic AS VS Percent: 13 %
Date Time Interrogation Session: 20180227165919
Implantable Lead Implant Date: 20111219
Implantable Lead Location: 753860
Implantable Lead Model: 5092
Lead Channel Impedance Value: 484 Ohm
Lead Channel Impedance Value: 596 Ohm
Lead Channel Pacing Threshold Pulse Width: 0.4 ms
Lead Channel Pacing Threshold Pulse Width: 1.5 ms
Lead Channel Sensing Intrinsic Amplitude: 5.6 mV
Lead Channel Setting Sensing Sensitivity: 2 mV
MDC IDC LEAD IMPLANT DT: 20111219
MDC IDC LEAD LOCATION: 753859
MDC IDC MSMT BATTERY REMAINING LONGEVITY: 9 mo
MDC IDC MSMT BATTERY VOLTAGE: 2.69 V
MDC IDC MSMT LEADCHNL RA PACING THRESHOLD AMPLITUDE: 2 V
MDC IDC MSMT LEADCHNL RV PACING THRESHOLD AMPLITUDE: 0.5 V
MDC IDC PG IMPLANT DT: 20111219
MDC IDC SET LEADCHNL RA PACING AMPLITUDE: 4.5 V
MDC IDC SET LEADCHNL RV PACING AMPLITUDE: 2 V
MDC IDC SET LEADCHNL RV PACING PULSEWIDTH: 0.4 ms

## 2016-03-28 MED ORDER — ISOSORBIDE MONONITRATE ER 120 MG PO TB24: ORAL_TABLET | ORAL | 3 refills | Status: DC

## 2016-03-28 NOTE — Addendum Note (Signed)
Addended by: Alvis Lemmings C on: 03/28/2016 01:23 PM   Modules accepted: Orders

## 2016-03-28 NOTE — Patient Instructions (Addendum)
Medication Instructions: - Your physician recommends that you continue on your current medications as directed. Please refer to the Current Medication list given to you today.  Labwork: - none ordered   Procedures/Testing: - none ordered  Follow-Up: - Remote monitoring is used to monitor your Pacemaker of ICD from home. This monitoring reduces the number of office visits required to check your device to one time per year. It allows [place] to keep an eye on the functioning of your device to ensure it is working properly. You are scheduled for a device check from home on 06/27/16. You may send your transmission at any time that day. If you have a wireless device, the transmission will be sent automatically. After your physician reviews your transmission, you will receive a postcard with your next transmission date.  (if you cannot send a remote transmission on this date, please call [phone] to reschedule)   - Your physician wants you to follow-up in: 6 months with Dr. Caryl Comes. You will receive a reminder letter in the mail two months in advance. If you don't receive a letter, please call our office to schedule the follow-up appointment.    Any Additional Special Instructions Will Be Listed Below (If Applicable).     If you need a refill on your cardiac medications before your next appointment, please call your pharmacy.

## 2016-03-28 NOTE — Progress Notes (Signed)
Patient Care Team: Sofie Hartigan, MD as PCP - General (Family Medicine)   HPI  Donald Juarez is a 71 y.o. male Seen in followup for pacemaker implanted 2011 for sinus node dysfunction.  He also has a history of atrial flutter for which he underwent ablation 10/16   He also has problems with systolic hypertension and orthostatic intolerance; Amlodipine was stopped and imdur decreased.  2/17 imdur increased 2/2 angina/neck pain    Cath 2/16  Patent  left circumflex stents. There was 80% stenosis in the ostial right AV groove artery at the bifurcation of the PDA. This was not an optimal location for PCI and supplied a relatively small area Felt that the risk of PCI outweighs the benefit. Left ventricular end-diastolic pressure was 22. Myoview 4/16>> no ischemia EF 55%   I have reviewed the noes of October 2016. Because of ongoing discomfort he underwent catheterization  With complex bifurcation lesion in the RCA distribution. He had protracted post intervention chest pain peak troponin was greater than 10;  catheterization demonstrated subtotal occlusion of the POBA site and it was felt that repeat intervention  greater risk than benefit    No interval chest pain. Breathing is stable. Biggest issues related to his prostate chemotherapy.  Orhtostasis was much improved by decreasing the metopr 50>>25 in am and changing nitrates to PM  He is also no longer taking alpha-blocker.        Past Medical History:  Diagnosis Date  . Anginal pain ([place])    feels this as jaw pain.  takes imdur for this. rarely uses nitro.  [name] Anxiety   . Arthritis   . Asthma   . Bilateral claudication of lower limb ([place])   . Carotid stenosis    a. 07/2013 Carotid U/S: 100% RICA (pt prev reported h/o 99991111), LICA [id number], bilat ECA 50%, normal vertebrals and subclavians bilat-->F/U needed in 01/2014.  . Cataract of left eye   . Cataract, right    a. 10/2010 s/p cataract surgery   . COPD (chronic  obstructive pulmonary disease) ([place])   . Coronary artery disease    a. 06/1996 & 03/2005 Cath: non-obs dzs;  b. 07/2009 NSTEMI/Cath: LCX 99->PCI/DES extending into OM1;  c. 01/2013 Cath: patent LCX stent-->Med Rx.; d. cath 03/2014: RPAV dzs->Med rx; e. 10/2014 Cath/PCI: LM 30, LAD 20p/m, LCX 50ost/p, 10 ISR, 60d, OM1 min irregs, OM2 50, RCA 30ost, 80m, 60d, RPDA 70(2.5x8 Xience DES), RPAV 90small(PTCA); f. 10/2014 Relook Cath: RPAV 99(Med Rx), EF 45-50.  [name] Dysrhythmia    since ablation, flutter resolved.  . Essential hypertension   . Hyperkalemia   . Hyperlipidemia   . Myocardial infarction 2015   had stent inserted and then had heart attack after. stent inserted due to blockage  . Paroxysmal atrial flutter ([place])    a. CHA2DS2VASc = 3 -> eliquis;  b. 07/2013 Echo: EF 50-55%, mildly dil LA.c. s/p RFCA 10/31/2013 by Dr Caryl Comes  . Prostate cancer ([place])   . Pulmonary nodule, right    a. 0.7cm RLL - stable by CT 06/2013.  [name] Rectal cancer ([place])    a. Status post colostomy  . Seizures ([place])    a. last 30 years ago  . Shingles    a. 09/2012.  . SSS (sick sinus syndrome) ([place])    a. 12/2009 S/P MDT [place] ADDR01 DC PPM, ser # [id number] ([name]).  . Syncope and collapse     Past Surgical History:  Procedure Laterality  Date  . ABLATION  10/31/2013   CTI ablation by Dr Caryl Comes for atrial flutter  . ATRIAL FLUTTER ABLATION N/A 10/31/2013   Procedure: ATRIAL FLUTTER ABLATION;  Surgeon: [name], MD;  Location: [facility] CATH LAB;  Service: Cardiovascular;  Laterality: N/A;  . CARDIAC CATHETERIZATION  07/2009   [facility]  . CARDIAC CATHETERIZATION  01/2013   [facility]  . CARDIAC CATHETERIZATION  03/2014   [facility]  . CARDIAC CATHETERIZATION N/A 11/02/2014   Procedure: Left Heart Cath and Cors/Grafts Angiography;  Surgeon: [name], MD;  Location: [place] CV LAB;  Service: Cardiovascular;  Laterality: N/A;  . CARDIAC CATHETERIZATION N/A 11/02/2014   Procedure: Coronary Stent Intervention;  Surgeon: [name], MD;  Location: [place] CV LAB;  Service: Cardiovascular;  Laterality: N/A;  . CARDIAC CATHETERIZATION N/A 11/03/2014   Procedure: Left Heart Cath and Coronary Angiography;  Surgeon: [name], MD;  Location: [place] CV LAB;  Service: Cardiovascular;  Laterality: N/A;  . CARDIAC SURGERY  2011   Stents placed   . CATARACT EXTRACTION Left   . CATARACT EXTRACTION W/PHACO Left 06/01/2014   Procedure: CATARACT EXTRACTION PHACO AND INTRAOCULAR LENS PLACEMENT (IOC);  Surgeon: [name], MD;  Location: [name] ORS;  Service: Ophthalmology;  Laterality: Left;  . cataract surgery  2012  . colon surgery     cancer removal  . COLON SURGERY     colostomy  . CORONARY ANGIOPLASTY    . INSERT / REPLACE / REMOVE PACEMAKER  2011   just a pacer  . KNEE ARTHROSCOPY Right 1980's  . PACEMAKER PLACEMENT  01/17/2010  . TOTAL KNEE ARTHROPLASTY Right 01/14/2016   Procedure: TOTAL KNEE ARTHROPLASTY;  Surgeon: [name], MD;  Location: [name] ORS;  Service: Orthopedics;  Laterality: Right;    Current Outpatient Prescriptions  Medication Sig Dispense Refill  . albuterol (PROAIR HFA) 108 (90 Base) MCG/ACT inhaler Inhale 2 puffs into the lungs every 6 (six) hours as needed for wheezing or shortness of breath. 1 Inhaler 6  . albuterol (PROVENTIL) (2.5 MG/3ML) 0.083% nebulizer solution USE 1 VIAL VIA BY NEBULIZER EVERY 6 HOURS AS NEEDED FOR WHEEZING OR SHORTNESS OF BREATH 360 mL 1  . amLODipine (NORVASC) 2.5 MG tablet Take 2.5 mg by mouth daily.    [name] arformoterol (BROVANA) 15 MCG/2ML NEBU Take 15 mcg by nebulization 2 (two) times daily. Take 2 mLs (15 mcg total) by nebulization 2 (two) times daily. DX code 493.20 Every morning and 1800    . aspirin EC 81 MG tablet Take 81 mg by mouth daily.    [name] atorvastatin (LIPITOR) 40 MG tablet Take 40 mg by mouth at bedtime. After 1900    . azithromycin (ZITHROMAX) 250 MG tablet TAKE ONE (1) TABLET EACH DAY 30 each 2  . budesonide (PULMICORT) 0.5  MG/2ML nebulizer solution Take 2 mLs (0.5 mg total) by nebulization 2 (two) times daily. 240 mL 2  . Cholecalciferol (VITAMIN D) 2000 UNITS tablet Take 2,000 Units by mouth daily.    . degarelix (FIRMAGON) 120 MG injection Inject into the skin every 3 (three) months.     . INCRUSE ELLIPTA 62.5 MCG/INH AEPB INHALE ONE PUFF INTO THE LUNGS DAILY 30 each 5  . isosorbide mononitrate (IMDUR) 120 MG 24 hr tablet Take one tablet (120 mg) by mouth at bedtime    . metoprolol tartrate (LOPRESSOR) 25 MG tablet Take one tablet (25 mg) in the morning and two tablets (50 mg) in the evening (Patient taking  differently: Take 25 mg by mouth 3 (three) times daily. ) 90 tablet 11  . nitroGLYCERIN (NITROSTAT) 0.4 MG SL tablet Place 1 tablet (0.4 mg total) under the tongue every 5 (five) minutes as needed for chest pain. 20 tablet 12  . nystatin cream (MYCOSTATIN) Apply 1 application topically 2 (two) times daily as needed. Rash/skin irritation.    [name] PARoxetine (PAXIL) 10 MG tablet Take 10 mg by mouth daily.    . traMADol (ULTRAM) 50 MG tablet Take 1-2 tablets (50-100 mg total) by mouth every 4 (four) hours as needed for moderate pain. 40 tablet 0  . enoxaparin (LOVENOX) 40 MG/0.4ML injection Inject 0.4 mLs (40 mg total) into the skin daily. 14 Syringe 0   No current facility-administered medications for this visit.     No Known Allergies  Review of Systems negative except from HPI and PMH  Physical Exam BP (!) 142/80 (BP Location: Left Arm, Patient Position: Sitting, Cuff Size: Normal)   Pulse (!) 135   Ht 5\' 9"  (1.753 m)   Wt 206 lb 12 oz (93.8 kg)   BMI 30.53 kg/m  Well developed and well nourished in no acute distress HENT normal E scleral and icterus clear Neck Supple JVP flat; carotids brisk and full Clear to ausculation Fast and irregular  rate and rhythm, no murmurs gallops or rub Soft with active bowel sounds No clubbing cyanosis no Edema Alert and oriented, grossly normal motor and sensory  function Skin Warm and Dry  ECG demonstrates atrially paced rhythm with rare PVCs 28/10/45 ST-T changes   Assessment and  Plan  Atrial flutter-s/p ablation  Atrial sensing issues  Sinus node dysfunction  PVCs   Hypertension with orthostatic hypotension  Coronary disease with prior stenting  And repeat stenting  HFpEF  COPD  prostate Cancer  Anger/anxiety issue  Infrequent PVCs-asymptomatic   Without symptoms of ischemia  Euvolemic continue current meds  Blood pressure is reasonable   he continues with spikes but the drops are now better no longer taking alpha-blocker.  Pacemaker is approaching ERI.  Atrial lead sensing issues are potentially a problem and will think about adding an atrial lead at device generator replacement  Encouraged him to follow-up with his PCP regarding depression and anger management.      ,More than 50% of 25 min was spent in counseling related to the above

## 2016-04-03 ENCOUNTER — Ambulatory Visit (INDEPENDENT_AMBULATORY_CARE_PROVIDER_SITE_OTHER): Payer: Medicare Other | Admitting: Internal Medicine

## 2016-04-03 ENCOUNTER — Encounter: Payer: Self-pay | Admitting: Internal Medicine

## 2016-04-03 VITALS — BP 130/78 | HR 75 | Wt 206.0 lb

## 2016-04-03 DIAGNOSIS — J441 Chronic obstructive pulmonary disease with (acute) exacerbation: Secondary | ICD-10-CM | POA: Diagnosis not present

## 2016-04-03 MED ORDER — IPRATROPIUM-ALBUTEROL 0.5-2.5 (3) MG/3ML IN SOLN
3.0000 mL | Freq: Once | RESPIRATORY_TRACT | Status: AC
  Administered 2016-04-03: 3 mL via RESPIRATORY_TRACT

## 2016-04-03 MED ORDER — PREDNISONE 20 MG PO TABS: 40.0000 mg | ORAL_TABLET | Freq: Every day | ORAL | 1 refills | Status: DC

## 2016-04-03 NOTE — Patient Instructions (Signed)
Prednisone 40 mg daily for 7 days Continue current dose of Levaquin for UTI

## 2016-04-03 NOTE — Progress Notes (Signed)
   Subjective:    Patient ID: Donald Juarez, male    DOB: 05/18/1945, 71 y.o.   MRN: [id number]  Synopsis: Donald Juarez first saw the [facility]monary clinic in 2014 for COPD after smoking 1 PPD for 30 years and quitting in 2010.  He has frequent exacerbations.    03/2013 Full PFT> Ratio 53% FEV1 1.89 (65% pred, 13% change), TLC 5.83 L (93% pred), DLCO 16.6 (69% pred) Quit tobacco 2010 Recent Cardiac Stent 11/16  CC: chronic SOB, follow up COPD +cough and chest congestion  HPI  Patient having acute mild COPD exacerbation Douneb given in office, has chest congestion and productive cough for last several days Patient was started on azithromycin to prevent COPD exacerbations  Patient feels bad, increased cough and feeling bad Has intermittent coughing spells-+wheezing   Inhaler regimen as follows 7AM-8AM albuterol neb/Brovana/takes incruse Albuterol Neb throughout the day as needed Refuses to obtain sleep study   Review of Systems  Constitutional: Positive for fatigue. Negative for chills and fever.  HENT: Positive for congestion, postnasal drip and rhinorrhea.   Respiratory: Positive for cough, shortness of breath and wheezing. Negative for chest tightness.   Cardiovascular: Negative for chest pain, palpitations and leg swelling.  Genitourinary: Positive for difficulty urinating and frequency.  Neurological: Negative for dizziness.  All other systems reviewed and are negative.      Objective:   Physical Exam  Constitutional: He is oriented to person, place, and time. He appears well-developed and well-nourished. No distress.  HENT:  Mouth/Throat: No oropharyngeal exudate.  Eyes: EOM are normal.  Neck: Neck supple.  Cardiovascular: Normal rate, regular rhythm and normal heart sounds.   No murmur heard. Pulmonary/Chest: No stridor. No respiratory distress. He has wheezes. He has rales.  Musculoskeletal: Normal range of motion. He exhibits no edema.  Neurological:  He is alert and oriented to person, place, and time.  Skin: Skin is warm. He is not diaphoretic.  Psychiatric: He has a normal mood and affect.   BP 130/78 (BP Location: Right Arm, Cuff Size: Normal)   Pulse 75   Wt 206 lb (93.4 kg)   SpO2 96%   BMI 30.42 kg/m    03/12/2014 left heart catheterization reviewed> moderately elevated LVEDP, patent stents, occlusion noted in a bifurcation in the RCA territory, LVEF 65% 05/10/2014 chest x-ray images personally reviewed there are no pulmonary infiltrates but there is emphysema bilaterally pacemaker in place    Assessment & Plan:  71 yo white male with moderate  COPD with chronic SOB COPD, GOLD GRADE C with acute COPD exacerbation Patient has chronic UTi and currently on oral abx with Levaquin  1.continue Incruse 2.albuterol NEB as needed and QHS 3.continue zithromax 250 mg daily for COPD exac prevention 4.continue pulmciort/LABA(brovana) as prescribed 5.robitussin with codeine for cough 6.start prednisone 40 mg daily for 7 days 7.continue oral abx as prescribed   Follow up 6 month to assess interval changes  Patient  satisfied with Plan of action and management. All questions answered  Corrin Parker, M.D.  [facility]  Medical Director [place] Director [facility] Cardio-Pulmonary Department

## 2016-04-03 NOTE — Addendum Note (Signed)
Addended by: Oscar La R on: 04/03/2016 11:51 AM   Modules accepted: Orders

## 2016-04-11 ENCOUNTER — Other Ambulatory Visit: Payer: Self-pay | Admitting: Internal Medicine

## 2016-04-11 DIAGNOSIS — I6523 Occlusion and stenosis of bilateral carotid arteries: Secondary | ICD-10-CM

## 2016-04-13 ENCOUNTER — Ambulatory Visit: Payer: Medicare Other

## 2016-04-13 DIAGNOSIS — I6523 Occlusion and stenosis of bilateral carotid arteries: Secondary | ICD-10-CM | POA: Diagnosis not present

## 2016-04-14 LAB — VAS US CAROTID
LCCADDIAS: -22 cm/s
LEFT ECA DIAS: -19 cm/s
LEFT VERTEBRAL DIAS: -24 cm/s
LICADSYS: 177 cm/s
Left CCA dist sys: -64 cm/s
Left CCA prox dias: 27 cm/s
Left CCA prox sys: 89 cm/s
Left ICA dist dias: 55 cm/s
Left ICA prox dias: -58 cm/s
Left ICA prox sys: -198 cm/s
RCCAPDIAS: 1 cm/s
RIGHT ECA DIAS: 33 cm/s
RIGHT VERTEBRAL DIAS: -10 cm/s
Right CCA prox sys: 63 cm/s

## 2016-04-18 ENCOUNTER — Ambulatory Visit (INDEPENDENT_AMBULATORY_CARE_PROVIDER_SITE_OTHER): Payer: Medicare Other | Admitting: Cardiology

## 2016-04-18 ENCOUNTER — Encounter: Payer: Self-pay | Admitting: Cardiology

## 2016-04-18 VITALS — BP 130/76 | HR 81 | Ht 69.0 in | Wt 209.2 lb

## 2016-04-18 DIAGNOSIS — E784 Other hyperlipidemia: Secondary | ICD-10-CM | POA: Diagnosis not present

## 2016-04-18 DIAGNOSIS — I951 Orthostatic hypotension: Secondary | ICD-10-CM

## 2016-04-18 DIAGNOSIS — E7849 Other hyperlipidemia: Secondary | ICD-10-CM

## 2016-04-18 DIAGNOSIS — I2510 Atherosclerotic heart disease of native coronary artery without angina pectoris: Secondary | ICD-10-CM

## 2016-04-18 DIAGNOSIS — I4892 Unspecified atrial flutter: Secondary | ICD-10-CM | POA: Diagnosis not present

## 2016-04-18 NOTE — Progress Notes (Signed)
Cardiology Office Note   Date:  04/18/2016   ID:  Donald Juarez, DOB 1945/08/20, MRN [id number]  Referring Doctor:  Sofie Hartigan, MD   Cardiologist:   Wende Bushy, MD   Reason for consultation:  Chief Complaint  Patient presents with  . other    6 month follow up. Meds reviewed by the pt. verbally.       History of Present Illness: Donald Juarez is a 71 y.o. male who presents for  Follow-up for history of CAD  Review medical records show: Patient has known coronary artery disease status post multiple stenting. Last heart cath was October 2016. He developed  NSTEMI post-PCI.  He has chronic angina which is jaw pain and some chest pain. Since his last visit, he needed to take 2 nitroglycerin sublinguals, none the last 3 months.   He also has a long history of hypertension with orthostatic drop in his blood pressure. He had episode of passing out likely due to significant drop in his blood pressure when he was placed on Flomax. Since discontinuation of that medication, he has had no more episodes of syncope.  Patient denies any, orthopnea, edema. He has seen pulmonary recently and was started on prednisone again.  ROS:  Please see the history of present illness. Aside from mentioned under HPI, all other systems are reviewed and negative.      Past Medical History:  Diagnosis Date  . Anginal pain ([place])    feels this as jaw pain.  takes imdur for this. rarely uses nitro.  [name] Anxiety   . Arthritis   . Asthma   . Bilateral claudication of lower limb ([place])   . Carotid stenosis    a. 07/2013 Carotid U/S: 100% RICA (pt prev reported h/o 27%), LICA 74-12%, bilat ECA 50%, normal vertebrals and subclavians bilat-->F/U needed in 01/2014.  . Cataract of left eye   . Cataract, right    a. 10/2010 s/p cataract surgery   . COPD (chronic obstructive pulmonary disease) ([place])   . Coronary artery disease    a. 06/1996 & 03/2005 Cath: non-obs dzs;  b. 07/2009 NSTEMI/Cath: LCX  99->PCI/DES extending into OM1;  c. 01/2013 Cath: patent LCX stent-->Med Rx.; d. cath 03/2014: RPAV dzs->Med rx; e. 10/2014 Cath/PCI: LM 30, LAD 20p/m, LCX 50ost/p, 10 ISR, 60d, OM1 min irregs, OM2 50, RCA 30ost, 38m, 60d, RPDA 70(2.5x8 Xience DES), RPAV 90small(PTCA); f. 10/2014 Relook Cath: RPAV 99(Med Rx), EF 45-50.  [name] Dysrhythmia    since ablation, flutter resolved.  . Essential hypertension   . Hyperkalemia   . Hyperlipidemia   . Myocardial infarction 2015   had stent inserted and then had heart attack after. stent inserted due to blockage  . Paroxysmal atrial flutter ([place])    a. CHA2DS2VASc = 3 -> eliquis;  b. 07/2013 Echo: EF 50-55%, mildly dil LA.c. s/p RFCA 10/31/2013 by Dr Caryl Comes  . Prostate cancer ([place])   . Pulmonary nodule, right    a. 0.7cm RLL - stable by CT 06/2013.  [name] Rectal cancer ([place])    a. Status post colostomy  . Seizures ([place])    a. last 30 years ago  . Shingles    a. 09/2012.  . SSS (sick sinus syndrome) ([place])    a. 12/2009 S/P MDT [place] ADDR01 DC PPM, ser # [id number] ([name]).  . Syncope and collapse     Past Surgical History:  Procedure Laterality Date  . ABLATION  10/31/2013   CTI ablation  by Dr Caryl Comes for atrial flutter  . ATRIAL FLUTTER ABLATION N/A 10/31/2013   Procedure: ATRIAL FLUTTER ABLATION;  Surgeon: [name], MD;  Location: [facility] CATH LAB;  Service: Cardiovascular;  Laterality: N/A;  . CARDIAC CATHETERIZATION  07/2009   [facility]  . CARDIAC CATHETERIZATION  01/2013   [facility]  . CARDIAC CATHETERIZATION  03/2014   [facility]  . CARDIAC CATHETERIZATION N/A 11/02/2014   Procedure: Left Heart Cath and Cors/Grafts Angiography;  Surgeon: [name], MD;  Location: [place] [facility] CV LAB;  Service: Cardiovascular;  Laterality: N/A;  . CARDIAC CATHETERIZATION N/A 11/02/2014   Procedure: Coronary Stent Intervention;  Surgeon: [name], MD;  Location: [place] CV LAB;  Service: Cardiovascular;  Laterality: N/A;  . CARDIAC CATHETERIZATION N/A 11/03/2014    Procedure: Left Heart Cath and Coronary Angiography;  Surgeon: [name], MD;  Location: [place] CV LAB;  Service: Cardiovascular;  Laterality: N/A;  . CARDIAC SURGERY  2011   Stents placed   . CATARACT EXTRACTION Left   . CATARACT EXTRACTION W/PHACO Left 06/01/2014   Procedure: CATARACT EXTRACTION PHACO AND INTRAOCULAR LENS PLACEMENT (IOC);  Surgeon: [name], MD;  Location: [name] ORS;  Service: Ophthalmology;  Laterality: Left;  . cataract surgery  2012  . colon surgery     cancer removal  . COLON SURGERY     colostomy  . CORONARY ANGIOPLASTY    . INSERT / REPLACE / REMOVE PACEMAKER  2011   just a pacer  . KNEE ARTHROSCOPY Right 1980's  . PACEMAKER PLACEMENT  01/17/2010  . TOTAL KNEE ARTHROPLASTY Right 01/14/2016   Procedure: TOTAL KNEE ARTHROPLASTY;  Surgeon: [name], MD;  Location: [name] ORS;  Service: Orthopedics;  Laterality: Right;     reports that he quit smoking about 7 years ago. His smoking use included Cigarettes. He has a 30.00 pack-year smoking history. He has never used smokeless tobacco. He reports that he does not drink alcohol or use drugs.   family history includes Cancer in his mother; Heart attack in his father; Heart disease in his father; Hyperlipidemia in his father and mother; Hypertension in his father and mother.   Outpatient Medications Prior to Visit  Medication Sig Dispense Refill  . albuterol (PROAIR HFA) 108 (90 Base) MCG/ACT inhaler Inhale 2 puffs into the lungs every 6 (six) hours as needed for wheezing or shortness of breath. 1 Inhaler 6  . albuterol (PROVENTIL) (2.5 MG/3ML) 0.083% nebulizer solution USE 1 VIAL VIA BY NEBULIZER EVERY 6 HOURS AS NEEDED FOR WHEEZING OR SHORTNESS OF BREATH 360 mL 1  . amLODipine (NORVASC) 2.5 MG tablet Take 2.5 mg by mouth daily.    [name] arformoterol (BROVANA) 15 MCG/2ML NEBU Take 15 mcg by nebulization 2 (two) times daily. Take 2 mLs (15 mcg total) by nebulization 2 (two) times daily. DX code  493.20 Every morning and 1800    . aspirin EC 81 MG tablet Take 81 mg by mouth daily.    [name] atorvastatin (LIPITOR) 40 MG tablet Take 40 mg by mouth at bedtime. After 1900    . budesonide (PULMICORT) 0.5 MG/2ML nebulizer solution Take 2 mLs (0.5 mg total) by nebulization 2 (two) times daily. 240 mL 2  . Cholecalciferol (VITAMIN D) 2000 UNITS tablet Take 2,000 Units by mouth daily.    . degarelix (FIRMAGON) 120 MG injection Inject into the skin every 3 (three) months.     . INCRUSE ELLIPTA 62.5 MCG/INH AEPB INHALE ONE PUFF INTO THE LUNGS DAILY 30 each  5  . isosorbide mononitrate (IMDUR) 120 MG 24 hr tablet Take one tablet (120 mg) by mouth at bedtime 90 tablet 3  . metoprolol tartrate (LOPRESSOR) 25 MG tablet Take one tablet (25 mg) in the morning and two tablets (50 mg) in the evening (Patient taking differently: Take 25 mg by mouth 3 (three) times daily. ) 90 tablet 11  . nitroGLYCERIN (NITROSTAT) 0.4 MG SL tablet Place 1 tablet (0.4 mg total) under the tongue every 5 (five) minutes as needed for chest pain. 20 tablet 12  . nystatin cream (MYCOSTATIN) Apply 1 application topically 2 (two) times daily as needed. Rash/skin irritation.    [name] PARoxetine (PAXIL) 10 MG tablet Take 10 mg by mouth daily.    . traMADol (ULTRAM) 50 MG tablet Take 1-2 tablets (50-100 mg total) by mouth every 4 (four) hours as needed for moderate pain. 40 tablet 0  . azithromycin (ZITHROMAX) 250 MG tablet TAKE ONE (1) TABLET EACH DAY (Patient not taking: Reported on 04/18/2016) 30 each 2  . enoxaparin (LOVENOX) 40 MG/0.4ML injection Inject 0.4 mLs (40 mg total) into the skin daily. 14 Syringe 0  . levofloxacin (LEVAQUIN) 500 MG tablet Take 500 mg by mouth daily.    . predniSONE (DELTASONE) 20 MG tablet Take 2 tablets (40 mg total) by mouth daily with breakfast. (Patient not taking: Reported on 04/18/2016) 14 tablet 1   No facility-administered medications prior to visit.     Patient takes amlodipine 2.5 mg daily Metoprolol is  actually taken as 25 mg once a morning one at noon , one at night night.  Allergies: Patient has no known allergies.    PHYSICAL EXAM: VS:  BP 130/76 (BP Location: Left Arm, Patient Position: Sitting, Cuff Size: Normal)   Pulse 81   Ht 5\' 9"  (1.753 m)   Wt 209 lb 4 oz (94.9 kg)   BMI 30.90 kg/m  , Body mass index is 30.9 kg/m. Wt Readings from Last 3 Encounters:  04/18/16 209 lb 4 oz (94.9 kg)  04/03/16 206 lb (93.4 kg)  03/28/16 206 lb 12 oz (93.8 kg)  GENERAL:  well developed, well nourished, obese, not in acute distress HEENT: normocephalic, pink conjunctivae, anicteric sclerae, no xanthelasma, normal dentition, oropharynx clear NECK:  no neck vein engorgement, JVP normal, no hepatojugular reflux, carotid upstroke brisk and symmetric, no bruit, no thyromegaly, no lymphadenopathy LUNGS:  good respiratory effort, clear to auscultation bilaterally with occasional wheezing CV:  PMI not displaced, no thrills, no lifts, S1 and S2 within normal limits, no palpable S3 or S4, no murmurs, no rubs, no gallops ABD:  Soft, nontender, nondistended, normoactive bowel sounds, no abdominal aortic bruit, no hepatomegaly, no splenomegaly MS: nontender back, no kyphosis, no scoliosis, no joint deformities EXT:  2+ DP/PT pulses, no edema, no varicosities, no cyanosis, no clubbing SKIN: warm, nondiaphoretic, normal turgor, no ulcers NEUROPSYCH: alert, oriented to person, place, and time, sensory/motor grossly intact, normal mood, appropriate affect    Recent Labs: 01/16/2016: BUN 12; Creatinine, Ser 0.66; Hemoglobin 10.6; Platelets 172; Potassium 3.7; Sodium 133   Lipid Panel No results found for: CHOL, TRIG, HDL, CHOLHDL, VLDL, LDLCALC, LDLDIRECT  03/29/2016: TC 177 TG 116 LDL 94 HDL 60.3  Other studies Reviewed:  EKG:  The ekg from 10/14/2015 was personally reviewed by me and it revealed atrial paced   Additional studies/ records that were reviewed personally reviewed by me today  include: Left heart cath 11/03/2014:  LM lesion, 30% stenosed.  Dist Cx lesion, 60%  stenosed.  Prox LAD to Mid LAD lesion, 20% stenosed.  Ost Cx to Prox Cx lesion, 50% stenosed.  Prox Cx lesion, 10% stenosed. The lesion was previously treated with a stent (unknown type) greater than two years ago.  2nd Mrg lesion, 50% stenosed.  Ost RCA lesion, 30% stenosed.  Mid RCA lesion, 40% stenosed.  Dist RCA lesion, 60% stenosed.  A drug-eluting stent was placed.  Post Atrio-2 lesion, 40% stenosed. The lesion was previously treated with angioplasty .  Post Atrio-1 lesion, 99% stenosed.  There is mild left ventricular systolic dysfunction.   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 There is TIMI 2 flow in that area. This is the culprit for non-ST elevation myocardial infarction post PCI.  2. Mildly reduced LV systolic function with basal inferior wall hypokinesis and ejection fraction of 45-50%. Normal left ventricular end-diastolic pressure.  Echo 11/04/2015: Left ventricle: The cavity size was normal. Systolic function was   normal. The estimated ejection fraction was in the range of 55%   to 60%. Regional wall motion abnormalities cannot be excluded.   Doppler parameters are consistent with abnormal left ventricular   relaxation (grade 1 diastolic dysfunction). - Left atrium: The atrium was normal in size. - Right ventricle: Systolic function was normal. - Pulmonary arteries: Systolic pressure was within the normal   range.  Impressions:  - PVCs in a bigeminal pattern. Challenging image quality.  ASSESSMENT AND PLAN: CAD status post multiple PCI NSTEMI 10/2014 Needed to take nitroglycerin 2 over the last 6 months, none last 3 months. Continue medical therapy, patient finished dual antiplatelet therapy in October. Continuing on aspirin 81 mg by mouth daily,  atorvastatin, Imdur, metoprolol. LVEF normal at 11/04/2015 echo.  Hyperlipidemia LDL goal is less than 70 due to CAD LDL recently was greater than 70. Discussed options of addressing dietary lifestyle changes or increasing statin dose. Patient will like to discuss this with PCP. Recommend to repeat at the very least in one to 2 months time.  History of hypertension with orthostatic changes Commended patient the same as last visit 10/14/2015:  He has been dealing with this for a long time . Knows not to take Flomax or similar drugs.  He has been stable on current meds for now.   history of atrial flutter for which he underwent ablation 10/16   Recommendations the same as from last visit 10/14/2015: Sinus node dysfunction status post pacemaker Patient follows up with Dr. Caryl Comes  Current medicines are reviewed at length with the patient today.  The patient does not have concerns regarding medicines.  Labs/ tests ordered today include:  Orders Placed This Encounter  Procedures  . EKG 12-Lead    I had a lengthy and detailed discussion with the patient regarding diagnoses, prognosis, diagnostic options, treatment options , and side effects of medications.   I counseled the patient on importance of lifestyle modification including heart healthy diet, regular physical activity    Disposition:   FU with undersigned in 6 months Signed, Wende Bushy, MD  04/18/2016 1:25 PM    [place]  This note was generated in part with voice recognition software and I apologize for any typographical errors that were not detected and corrected.

## 2016-04-18 NOTE — Patient Instructions (Signed)
Follow-Up: Your physician wants you to follow-up in: 6 months. You will receive a reminder letter in the mail two months in advance. If you don't receive a letter, please call our office to schedule the follow-up appointment.  It was a pleasure seeing you today here in the office. Please do not hesitate to give us a call back if you have any further questions. [phone]  Zorawar Strollo A. RN, BSN     

## 2016-04-24 ENCOUNTER — Other Ambulatory Visit: Payer: Self-pay | Admitting: Internal Medicine

## 2016-04-27 ENCOUNTER — Encounter: Payer: Self-pay | Admitting: Emergency Medicine

## 2016-04-27 ENCOUNTER — Telehealth: Payer: Self-pay | Admitting: Internal Medicine

## 2016-04-27 ENCOUNTER — Emergency Department: Payer: Medicare Other

## 2016-04-27 ENCOUNTER — Emergency Department
Admission: EM | Admit: 2016-04-27 | Discharge: 2016-04-27 | Disposition: A | Discharge status: Home / Self Care | Payer: Medicare Other | Attending: Emergency Medicine | Admitting: Emergency Medicine

## 2016-04-27 DIAGNOSIS — Z87891 Personal history of nicotine dependence: Secondary | ICD-10-CM | POA: Insufficient documentation

## 2016-04-27 DIAGNOSIS — J441 Chronic obstructive pulmonary disease with (acute) exacerbation: Secondary | ICD-10-CM | POA: Diagnosis not present

## 2016-04-27 DIAGNOSIS — Z85048 Personal history of other malignant neoplasm of rectum, rectosigmoid junction, and anus: Secondary | ICD-10-CM | POA: Insufficient documentation

## 2016-04-27 DIAGNOSIS — I200 Unstable angina: Secondary | ICD-10-CM | POA: Insufficient documentation

## 2016-04-27 DIAGNOSIS — Z950 Presence of cardiac pacemaker: Secondary | ICD-10-CM | POA: Diagnosis not present

## 2016-04-27 DIAGNOSIS — I10 Essential (primary) hypertension: Secondary | ICD-10-CM | POA: Diagnosis not present

## 2016-04-27 DIAGNOSIS — Z79899 Other long term (current) drug therapy: Secondary | ICD-10-CM | POA: Insufficient documentation

## 2016-04-27 DIAGNOSIS — Z7982 Long term (current) use of aspirin: Secondary | ICD-10-CM | POA: Diagnosis not present

## 2016-04-27 DIAGNOSIS — Z955 Presence of coronary angioplasty implant and graft: Secondary | ICD-10-CM | POA: Insufficient documentation

## 2016-04-27 DIAGNOSIS — R0789 Other chest pain: Secondary | ICD-10-CM | POA: Diagnosis present

## 2016-04-27 DIAGNOSIS — Z8546 Personal history of malignant neoplasm of prostate: Secondary | ICD-10-CM | POA: Insufficient documentation

## 2016-04-27 DIAGNOSIS — I252 Old myocardial infarction: Secondary | ICD-10-CM | POA: Diagnosis not present

## 2016-04-27 DIAGNOSIS — Z96651 Presence of right artificial knee joint: Secondary | ICD-10-CM | POA: Insufficient documentation

## 2016-04-27 LAB — CBC
HEMATOCRIT: 42.3 % (ref 40.0–52.0)
Hemoglobin: 14.6 g/dL (ref 13.0–18.0)
MCH: 31.6 pg (ref 26.0–34.0)
MCHC: 34.5 g/dL (ref 32.0–36.0)
MCV: 91.5 fL (ref 80.0–100.0)
PLATELETS: 259 10*3/uL (ref 150–440)
RBC: 4.62 MIL/uL (ref 4.40–5.90)
RDW: 15 % — AB (ref 11.5–14.5)
WBC: 8.7 10*3/uL (ref 3.8–10.6)

## 2016-04-27 LAB — BASIC METABOLIC PANEL
Anion gap: 7 (ref 5–15)
BUN: 24 mg/dL — AB (ref 6–20)
CALCIUM: 9.2 mg/dL (ref 8.9–10.3)
CO2: 26 mmol/L (ref 22–32)
Chloride: 105 mmol/L (ref 101–111)
Creatinine, Ser: 0.78 mg/dL (ref 0.61–1.24)
GFR calc Af Amer: >60 mL/min (ref >60)
Glucose, Bld: 119 mg/dL — ABNORMAL HIGH (ref 65–99)
POTASSIUM: 4.2 mmol/L (ref 3.5–5.1)
Sodium: 138 mmol/L (ref 135–145)

## 2016-04-27 LAB — TROPONIN I

## 2016-04-27 MED ORDER — LEVALBUTEROL HCL 1.25 MG/0.5ML IN NEBU
1.2500 mg | INHALATION_SOLUTION | Freq: Once | RESPIRATORY_TRACT | Status: AC
  Administered 2016-04-27: 1.25 mg via RESPIRATORY_TRACT
  Filled 2016-04-27: qty 0.5

## 2016-04-27 MED ORDER — PREDNISONE 20 MG PO TABS
60.0000 mg | ORAL_TABLET | Freq: Every day | ORAL | 0 refills | Status: AC
Start: 2016-04-27 — End: 2016-05-01

## 2016-04-27 MED ORDER — METHYLPREDNISOLONE SODIUM SUCC 125 MG IJ SOLR
125.0000 mg | Freq: Once | INTRAMUSCULAR | Status: AC
  Administered 2016-04-27: 125 mg via INTRAVENOUS
  Filled 2016-04-27: qty 2

## 2016-04-27 MED ORDER — PREDNISONE 20 MG PO TABS: 60.0000 mg | ORAL_TABLET | Freq: Every day | ORAL | 0 refills | Status: DC

## 2016-04-27 MED ORDER — ASPIRIN 81 MG PO CHEW
324.0000 mg | CHEWABLE_TABLET | Freq: Once | ORAL | Status: AC
  Administered 2016-04-27: 324 mg via ORAL
  Filled 2016-04-27: qty 4

## 2016-04-27 NOTE — Discharge Instructions (Addendum)
Call DR. Arida's office today for a follow up appointment tomorrow. If you have new or worsening chest pain please return to the ER for further evaluation  You have been seen in the emergency department today for chest pain. Your workup has shown normal results. As we discussed please follow-up with Dr. Fletcher Anon for recheck. Return to the emergency department for any further chest pain, trouble breathing, or any other symptom personally concerning to yourself.

## 2016-04-27 NOTE — ED Notes (Signed)
Pt remains to have expiratory wheezing but overall has improved breath sounds since treatment. Pt feels better.

## 2016-04-27 NOTE — ED Provider Notes (Signed)
-----------------------------------------   4:24 PM on 04/27/2016 -----------------------------------------  Patient care is symptom from Dr. Alfred Levins. Dr. Alfred Levins initially was going to admit the patient to the hospital for concerning chest pain however as this was likely going to be an observation stay the patient essentially refused admission stating that he only wanted to state if this was going to be an inpatient admission. A second troponin was ordered, with the plan to admit the patient if the second troponin elevates otherwise the patient would be discharged home with a course of steroids for possible COPD exacerbation. Patient's repeat troponin is negative. Patient has remained chest pain-free in the emergency department. I discussed this with the patient and again offered admission to the hospital but the patient states he rather be discharged home and will follow-up with his cardiologist.   Harvest Dark, MD 04/27/16 [phone]

## 2016-04-27 NOTE — Telephone Encounter (Signed)
Patient states that he had been having arm pain, shortness of breath, and jaw pain. At that time his BP was 198/115 and he took 2 nitro and it eased off. He states that his pulmonary physician started him on prednisone for his breathing but that the last time he had a heart attack he had arm pain and jaw pain so he wanted to see what he should do. He states that he just doesn't feel well and is concerned. Advised him to go to emergency room for further evaluation based on his history and symptoms. He verbalized understanding of our conversation, agreement with plan, and had no further questions at this time.

## 2016-04-27 NOTE — ED Notes (Addendum)
Collected cbc, bmp, troponin, sent to lab.

## 2016-04-27 NOTE — Care Management Note (Signed)
Case Management Note  Patient Details  Name: Donald Juarez MRN: [id number] Date of Birth: September 21, 1945  Subjective/Objective:      Spoke to patient after talking to Hospitalist. The MD wanted to bring the pt. In OBSV. status , and the patient feels that he got a bill last time he was observation, which he cannot pay for this time. The patient is asking to go home. Dr. Alfred Levins is consulting the cardiologist and will advise the patient of what he says. The patient has been made aware, and will await cardiology recommendation.              Action/Plan:   Expected Discharge Date:                  Expected Discharge Plan:     In-House Referral:     Discharge planning Services     Post Acute Care Choice:    Choice offered to:     DME Arranged:    DME Agency:     HH Arranged:    HH Agency:     Status of Service:     If discussed at [employer] of Stay Meetings, dates discussed:    Additional Comments:  Beau Fanny, RN 04/27/2016, 2:28 PM

## 2016-04-27 NOTE — ED Notes (Addendum)
Care assumed for pt, report received

## 2016-04-27 NOTE — ED Provider Notes (Addendum)
[facility] Emergency Department Provider Note  ____________________________________________  Time seen: Approximately 1:32 PM  I have reviewed the triage vital signs and the nursing notes.   HISTORY  Chief Complaint Chest Pain   HPI Donald Juarez is a 71 y.o. male with h/o CAD s/p multiple stents, last LHC in 2016, chronic angina, COPD, sick sinus syndrome status post pacemaker, rectal cancer on remission, and prostate cancer currently in remission on Lupron shots, hypertension, hyperlipidemia, paroxysmal atrial flutterpresents for evaluation of chest pain. Patient reports that yesterday evening he had an episode of chest pressure radiating to his jaw and his left arm. Patient reports that he has a history of chronic angina which usually presents as jaw pain but reports that he has never had chest pain or left arm pain before. He took 2 sublingual nitros as his blood pressure was also elevated at that time with systolics in the 119J. Reports resolution of his pain. He then had on and off pain throughout the night and this morning. Has had several episodes lasting a few seconds at a time. He reports severe substernal chest pressure radiating to the jaw. No longer radiating to the arm today. They're associated with shortness of breath. No nausea or vomiting, no dizziness, no diaphoresis. Patient reports 2 weeks of cough, wheezing, shortness of breath. He finished a two-week course of prednisone with improvement of his cough however continues to have shortness of breath. He has been using his inhalers at home with mild relief. No fever or chills. No history of blood clots, no recent travel or immobilization, no hemoptysis, no leg pain or swelling.  Past Medical History:  Diagnosis Date  . Anginal pain ([place])    feels this as jaw pain.  takes imdur for this. rarely uses nitro.  [name] Anxiety   . Arthritis   . Asthma   . Bilateral claudication of lower limb ([place])   .  Carotid stenosis    a. 07/2013 Carotid U/S: 100% RICA (pt prev reported h/o 47%), LICA 82-95%, bilat ECA 50%, normal vertebrals and subclavians bilat-->F/U needed in 01/2014.  . Cataract of left eye   . Cataract, right    a. 10/2010 s/p cataract surgery   . COPD (chronic obstructive pulmonary disease) ([place])   . Coronary artery disease    a. 06/1996 & 03/2005 Cath: non-obs dzs;  b. 07/2009 NSTEMI/Cath: LCX 99->PCI/DES extending into OM1;  c. 01/2013 Cath: patent LCX stent-->Med Rx.; d. cath 03/2014: RPAV dzs->Med rx; e. 10/2014 Cath/PCI: LM 30, LAD 20p/m, LCX 50ost/p, 10 ISR, 60d, OM1 min irregs, OM2 50, RCA 30ost, 100m, 60d, RPDA 70(2.5x8 Xience DES), RPAV 90small(PTCA); f. 10/2014 Relook Cath: RPAV 99(Med Rx), EF 45-50.  [name] Dysrhythmia    since ablation, flutter resolved.  . Essential hypertension   . Hyperkalemia   . Hyperlipidemia   . Myocardial infarction 2015   had stent inserted and then had heart attack after. stent inserted due to blockage  . Paroxysmal atrial flutter ([place])    a. CHA2DS2VASc = 3 -> eliquis;  b. 07/2013 Echo: EF 50-55%, mildly dil LA.c. s/p RFCA 10/31/2013 by Dr Caryl Comes  . Prostate cancer ([place])   . Pulmonary nodule, right    a. 0.7cm RLL - stable by CT 06/2013.  [name] Rectal cancer ([place])    a. Status post colostomy  . Seizures ([place])    a. last 30 years ago  . Shingles    a. 09/2012.  . SSS (sick sinus syndrome) ([place])  a. 12/2009 S/P MDT Adapta ADDR01 DC PPM, ser # [id number] ([name]).  . Syncope and collapse     Patient Active Problem List   Diagnosis Date Noted  . Primary localized osteoarthritis of right knee 01/14/2016  . Essential hypertension   . Hyperlipidemia   . SSS (sick sinus syndrome) ([place])   . Unstable angina ([place]) 11/03/2014  . NSTEMI (non-ST elevated myocardial infarction) ([place])   . Effort angina ([place]) 11/02/2014  . S/P PTCA (percutaneous transluminal coronary angioplasty) 11/02/2014  . Coronary artery disease involving native coronary artery   .  Prostate cancer ([place])   . Anxiety and depression 06/18/2014  . Fatigue 11/14/2013  . Typical atrial flutter ([place]) 10/31/2013  . Bilateral claudication of lower limb ([place])   . Coronary artery disease   . Paroxysmal atrial flutter ([place])   . Hypertension   . Carotid stenosis   . COPD, severe GOLD GRADE D 03/28/2013  . Solitary pulmonary nodule 03/28/2013    Past Surgical History:  Procedure Laterality Date  . ABLATION  10/31/2013   CTI ablation by Dr Caryl Comes for atrial flutter  . ATRIAL FLUTTER ABLATION N/A 10/31/2013   Procedure: ATRIAL FLUTTER ABLATION;  Surgeon: [name], MD;  Location: [facility] CATH LAB;  Service: Cardiovascular;  Laterality: N/A;  . CARDIAC CATHETERIZATION  [facility]   [facility]  . CARDIAC CATHETERIZATION  01/2013   [facility]  . CARDIAC CATHETERIZATION  03/2014   [facility]  . CARDIAC CATHETERIZATION N/A 11/02/2014   Procedure: Left Heart Cath and Cors/Grafts Angiography;  Surgeon: [name], MD;  Location: [place] CV LAB;  Service: Cardiovascular;  Laterality: N/A;  . CARDIAC CATHETERIZATION N/A 11/02/2014   Procedure: Coronary Stent Intervention;  Surgeon: [name], MD;  Location: [place] CV LAB;  Service: Cardiovascular;  Laterality: N/A;  . CARDIAC CATHETERIZATION N/A 11/03/2014   Procedure: Left Heart Cath and Coronary Angiography;  Surgeon: [name], MD;  Location: [place] CV LAB;  Service: Cardiovascular;  Laterality: N/A;  . CARDIAC SURGERY  2011   Stents placed   . CATARACT EXTRACTION Left   . CATARACT EXTRACTION W/PHACO Left 06/01/2014   Procedure: CATARACT EXTRACTION PHACO AND INTRAOCULAR LENS PLACEMENT (IOC);  Surgeon: [name], MD;  Location: [name] ORS;  Service: Ophthalmology;  Laterality: Left;  . cataract surgery  2012  . colon surgery     cancer removal  . COLON SURGERY     colostomy  . CORONARY ANGIOPLASTY    . INSERT / REPLACE / REMOVE PACEMAKER  2011   just a pacer  . KNEE ARTHROSCOPY Right 1980's  . PACEMAKER  PLACEMENT  01/17/2010  . TOTAL KNEE ARTHROPLASTY Right 01/14/2016   Procedure: TOTAL KNEE ARTHROPLASTY;  Surgeon: [name], MD;  Location: [name] ORS;  Service: Orthopedics;  Laterality: Right;    Prior to Admission medications   Medication Sig Start Date End Date Taking? Authorizing Provider  albuterol (PROAIR HFA) 108 (90 Base) MCG/ACT inhaler Inhale 2 puffs into the lungs every 6 (six) hours as needed for wheezing or shortness of breath. 06/08/15   Flora Lipps, MD  albuterol (PROVENTIL) (2.5 MG/3ML) 0.083% nebulizer solution USE 1 VIAL VIA BY NEBULIZER EVERY 6 HOURS AS NEEDED FOR WHEEZING OR SHORTNESS OF BREATH 04/24/16   Flora Lipps, MD  amLODipine (NORVASC) 2.5 MG tablet Take 2.5 mg by mouth daily. 11/27/15   Historical Provider, MD  arformoterol (BROVANA) 15 MCG/2ML NEBU Take 15 mcg by nebulization 2 (two) times daily. Take 2 mLs (15 mcg  total) by nebulization 2 (two) times daily. DX code 493.20 Every morning and 1800 09/23/13   Historical Provider, MD  aspirin EC 81 MG tablet Take 81 mg by mouth daily.    Historical Provider, MD  atorvastatin (LIPITOR) 40 MG tablet Take 40 mg by mouth at bedtime. After 1900    Historical Provider, MD  budesonide (PULMICORT) 0.5 MG/2ML nebulizer solution Take 2 mLs (0.5 mg total) by nebulization 2 (two) times daily. 03/12/15   Flora Lipps, MD  Cholecalciferol (VITAMIN D) 2000 UNITS tablet Take 2,000 Units by mouth daily.    Historical Provider, MD  degarelix (FIRMAGON) 120 MG injection Inject into the skin every 3 (three) months.     Historical Provider, MD  INCRUSE ELLIPTA 62.5 MCG/INH AEPB INHALE ONE PUFF INTO THE LUNGS DAILY 01/20/16   Flora Lipps, MD  isosorbide mononitrate (IMDUR) 120 MG 24 hr tablet Take one tablet (120 mg) by mouth at bedtime 03/28/16   [name], MD  metoprolol tartrate (LOPRESSOR) 25 MG tablet Take one tablet (25 mg) in the morning and two tablets (50 mg) in the evening Patient taking differently: Take 25 mg by mouth 3 (three)  times daily.  08/10/15   [name], MD  nitroGLYCERIN (NITROSTAT) 0.4 MG SL tablet Place 1 tablet (0.4 mg total) under the tongue every 5 (five) minutes as needed for chest pain. 11/04/14   Leonie Man, MD  nystatin cream (MYCOSTATIN) Apply 1 application topically 2 (two) times daily as needed. Rash/skin irritation. 09/17/15   Historical Provider, MD  PARoxetine (PAXIL) 10 MG tablet Take 10 mg by mouth daily. 12/06/15   Historical Provider, MD  traMADol (ULTRAM) 50 MG tablet Take 1-2 tablets (50-100 mg total) by mouth every 4 (four) hours as needed for moderate pain. 01/16/16   Duanne Guess, PA-C    Allergies Patient has no known allergies.  Family History  Problem Relation Age of Onset  . Cancer Mother     'male cancer"  . Hypertension Mother   . Hyperlipidemia Mother   . Heart disease Father   . Heart attack Father   . Hypertension Father   . Hyperlipidemia Father     Social History Social History  Substance Use Topics  . Smoking status: Former Smoker    Packs/day: 1.00    Years: 30.00    Types: Cigarettes    Quit date: 07/26/2008  . Smokeless tobacco: Never Used  . Alcohol use No    Review of Systems  Constitutional: Negative for fever. Eyes: Negative for visual changes. ENT: Negative for sore throat. Neck: No neck pain  Cardiovascular: + chest pain. Respiratory: + shortness of breath. Gastrointestinal: Negative for abdominal pain, vomiting or diarrhea. Genitourinary: Negative for dysuria. Musculoskeletal: Negative for back pain. Skin: Negative for rash. Neurological: Negative for headaches, weakness or numbness. Psych: No SI or HI  ____________________________________________   PHYSICAL EXAM:  VITAL SIGNS: ED Triage Vitals  Enc Vitals Group     BP 04/27/16 1251 123/61     Pulse Rate 04/27/16 1251 60     Resp 04/27/16 1251 20     Temp 04/27/16 1251 98 F (36.7 C)     Temp Source 04/27/16 1251 Oral     SpO2 04/27/16 1251 96 %     Weight  04/27/16 1247 209 lb (94.8 kg)     Height --      Head Circumference --      Peak Flow --  Pain Score 04/27/16 1247 6     Pain Loc --      Pain Edu? --      Excl. in [place]? --     Constitutional: Alert and oriented. Well appearing and in no apparent distress. HEENT:      Head: Normocephalic and atraumatic.         Eyes: Conjunctivae are normal. Sclera is non-icteric. EOMI. PERRL      Mouth/Throat: Mucous membranes are moist.       Neck: Supple with no signs of meningismus. Cardiovascular: Regular rate and rhythm. No murmurs, gallops, or rubs. 2+ symmetrical distal pulses are present in all extremities. No JVD. Respiratory: Normal respiratory effort. Lungs are clear to auscultation bilaterally with diffuse expiratory wheezes.  Gastrointestinal: Soft, non tender, and non distended with positive bowel sounds. No rebound or guarding. Genitourinary: No CVA tenderness. Musculoskeletal: Nontender with normal range of motion in all extremities. No edema, cyanosis, or erythema of extremities. Neurologic: Normal speech and language. Face is symmetric. Moving all extremities. No gross focal neurologic deficits are appreciated. Skin: Skin is warm, dry and intact. No rash noted. Psychiatric: Mood and affect are normal. Speech and behavior are normal.  ____________________________________________   LABS (all labs ordered are listed, but only abnormal results are displayed)  Labs Reviewed  BASIC METABOLIC PANEL - Abnormal; Notable for the following:       Result Value   Glucose, Bld 119 (*)    BUN 24 (*)    All other components within normal limits  CBC - Abnormal; Notable for the following:    RDW 15.0 (*)    All other components within normal limits  TROPONIN I   ____________________________________________  EKG  ED ECG REPORT I, Rudene Re, the attending physician, personally viewed and interpreted this ECG.  14:45 - 80 paced rhythm rate of 62, normal QRS and QTc  intervals, normal axis, ST depressions on inferior and lateral leads with no ST elevations. This EKG is unchanged from prior   13:17 - atrial paced rhythm, rate of 65, normal QRS and QTc intervals, normal axis, unchanged ST depressions in inferior and lateral leads but no ST elevation. ____________________________________________  RADIOLOGY  CXR: Negative ____________________________________________   PROCEDURES  Procedure(s) performed: None Procedures Critical Care performed:  None ____________________________________________   INITIAL IMPRESSION / ASSESSMENT AND PLAN / ED COURSE  71 y.o. male with h/o CAD s/p multiple stents, last LHC in 2016, chronic angina, COPD, sick sinus syndrome status post pacemaker, rectal cancer on remission, and prostate cancer currently in remission on Lupron shots, hypertension, hyperlipidemia, paroxysmal atrial flutterpresents for evaluation of multiple intermittent episodes of chest pain radiating to his neck and L arm. EKG with no acute changes. First troponin is negative. Patient given full ASA. NO pain at this time. Presentation concerning for unstable angina in this high risk patient. Patient also with mild COPD exacerbation in the setting of viral URI. Will give one breathing treatment and start patient on steroids. Plan to admit to Hospitalist for further management.     Pertinent labs & imaging results that were available during my care of the patient were reviewed by me and considered in my medical decision making (see chart for details).    ____________________________________________   FINAL CLINICAL IMPRESSION(S) / ED DIAGNOSES  Final diagnoses:  Unstable angina (HCC)      NEW MEDICATIONS STARTED DURING THIS VISIT:  New Prescriptions   No medications on file     Note:  This document  was prepared using Systems analyst and may include unintentional dictation errors.    Rudene Re, MD 04/27/16  [place], MD 04/27/16 [phone]

## 2016-04-27 NOTE — ED Notes (Signed)
Sitting with family. Waiting on repeat blood test.  NAD. No needs.

## 2016-04-27 NOTE — ED Triage Notes (Signed)
Pt to ed with c/o centralized chest pain started last night after dinner with jaw and left arm pain. Pt states he took two  Nitro tabs last night with some relief. Today pt c/o chest pain comes and goes but with no radiation. Pt is short of breath at this time. Pt with hx of MI x2 and has pacemaker.

## 2016-04-27 NOTE — Telephone Encounter (Signed)
Pt states he had an episode with is BP last night 198/115, has SOB, pain in arm in jaw.  He states he feels better, but not "great". His BP this am was 150/90, denies CP this morning, a little SOB though, denies any jaw and arm discomfort.  States he took 2 Nitro last night.

## 2016-04-28 ENCOUNTER — Telehealth: Payer: Self-pay | Admitting: Cardiology

## 2016-04-28 NOTE — Telephone Encounter (Signed)
Pt states he was in the ED yesterday for CP. Pt states he needs to be seen today. Please call.

## 2016-04-28 NOTE — Telephone Encounter (Signed)
Called patient back, received message from Dr Fletcher Anon  Pt is now coming on 05/09/16 to see Dr Yvone Neu    ----- Message -----  From: [name], MD  Sent: 04/27/2016  2:35 PM  To: Valora Corporal, RN, Rudi Coco   This is a patient of Dr. Yvone Neu who went to the emergency room today with chest pain. Troponin was negative. He needs a close follow-up visit with Dr. Yvone Neu.

## 2016-04-28 NOTE — Telephone Encounter (Signed)
Donald Juarez called and offered patient appointment for today with our PA but he declined stating that his wife is disabled and he is unable to come today. Patient states that he will come in for scheduled appointment on 05/09/16 with Dr. Yvone Neu. He verbalized understanding of our conversation, agreement with plan, and had no further questions or complaints at this time.

## 2016-05-08 ENCOUNTER — Other Ambulatory Visit: Payer: Self-pay | Admitting: Internal Medicine

## 2016-05-08 NOTE — Progress Notes (Addendum)
Cardiology Office Note   Date:  05/09/2016   ID:  Donald Juarez, DOB 10-25-1945, MRN [id number]  Referring Doctor:  Sofie Hartigan, MD   Cardiologist:   Wende Bushy, MD   Reason for consultation:  Chief Complaint  Patient presents with  . other     Early f/u per phone note. Ed on 3/29 for chest pain. Pt c/o chest pain, sob. States he had a light heart attack in ED. Reviewed meds with pt verbally.      History of Present Illness: Donald Juarez is a 71 y.o. male who presents for  Follow-up for history of CAD  Review medical records show: Patient has known coronary artery disease status post multiple stenting. Last heart cath was October 2016. He developed  NSTEMI post-PCI.  He has chronic angina which is jaw pain and some chest pain. Since his last visit, he needed to take 2 nitroglycerin sublinguals, none the last 3 months.   He also has a long history of hypertension with orthostatic drop in his blood pressure. He had episode of passing out likely due to significant drop in his blood pressure when he was placed on Flomax. Since discontinuation of that medication, he has had no more episodes of syncope.   04/27/2016: ER visit for CP. It felt like his"angina" CP/jaw pain. Took NTG SL (2 at once because he thought his meds were expired). Went to ER, ruled out. BP at the time at home was also > 976 systolic. No recurrence since then.  Pt has more SOB now also. Complains of leg cramping/ claudication.   ROS:  Please see the history of present illness. Aside from mentioned under HPI, all other systems are reviewed and negative.    Past Medical History:  Diagnosis Date  . Anginal pain ([place])    feels this as jaw pain.  takes imdur for this. rarely uses nitro.  [name] Anxiety   . Arthritis   . Asthma   . Bilateral claudication of lower limb ([place])   . Carotid stenosis    a. 07/2013 Carotid U/S: 100% RICA (pt prev reported h/o 73%), LICA 41-93%, bilat ECA 50%, normal vertebrals  and subclavians bilat-->F/U needed in 01/2014.  . Cataract of left eye   . Cataract, right    a. 10/2010 s/p cataract surgery   . COPD (chronic obstructive pulmonary disease) ([place])   . Coronary artery disease    a. 06/1996 & 03/2005 Cath: non-obs dzs;  b. 07/2009 NSTEMI/Cath: LCX 99->PCI/DES extending into OM1;  c. 01/2013 Cath: patent LCX stent-->Med Rx.; d. cath 03/2014: RPAV dzs->Med rx; e. 10/2014 Cath/PCI: LM 30, LAD 20p/m, LCX 50ost/p, 10 ISR, 60d, OM1 min irregs, OM2 50, RCA 30ost, 27m, 60d, RPDA 70(2.5x8 Xience DES), RPAV 90small(PTCA); f. 10/2014 Relook Cath: RPAV 99(Med Rx), EF 45-50.  [name] Dysrhythmia    since ablation, flutter resolved.  . Essential hypertension   . Hyperkalemia   . Hyperlipidemia   . Myocardial infarction 2015   had stent inserted and then had heart attack after. stent inserted due to blockage  . Paroxysmal atrial flutter ([place])    a. CHA2DS2VASc = 3 -> eliquis;  b. 07/2013 Echo: EF 50-55%, mildly dil LA.c. s/p RFCA 10/31/2013 by Dr Caryl Comes  . Prostate cancer ([place])   . Pulmonary nodule, right    a. 0.7cm RLL - stable by CT 06/2013.  [name] Rectal cancer ([place])    a. Status post colostomy  . Seizures ([place])  a. last 30 years ago  . Shingles    a. 09/2012.  . SSS (sick sinus syndrome) ([place])    a. 12/2009 S/P MDT [place] ADDR01 DC PPM, ser # [id number] ([name]).  . Syncope and collapse     Past Surgical History:  Procedure Laterality Date  . ABLATION  10/31/2013   CTI ablation by Dr Caryl Comes for atrial flutter  . ATRIAL FLUTTER ABLATION N/A 10/31/2013   Procedure: ATRIAL FLUTTER ABLATION;  Surgeon: [name], MD;  Location[facility] CATH LAB;  Service: Cardiovascular;  Laterality: N/A;  . CARDIAC CATHETERIZATION  07/2009   [facility]  . CARDIAC CATHETERIZATION  01/2013   [facility]  . CARDIAC CATHETERIZATION  03/2014   [facility]  . CARDIAC CATHETERIZATION N/A 11/02/2014   Procedure: Left Heart Cath and Cors/Grafts Angiography;  Surgeon: [name], MD;  Location: [place] CV LAB;   Service: Cardiovascular;  Laterality: N/A;  . CARDIAC CATHETERIZATION N/A 11/02/2014   Procedure: Coronary Stent Intervention;  Surgeon: [name], MD;  Location: [facility] CV LAB;  Service: Cardiovascular;  Laterality: N/A;  . CARDIAC CATHETERIZATION N/A 11/03/2014   Procedure: Left Heart Cath and Coronary Angiography;  Surgeon: [name], MD;  Location: [facility] CV LAB;  Service: Cardiovascular;  Laterality: N/A;  . CARDIAC SURGERY  2011   Stents placed   . CATARACT EXTRACTION Left   . CATARACT EXTRACTION W/PHACO Left 06/01/2014   Procedure: CATARACT EXTRACTION PHACO AND INTRAOCULAR LENS PLACEMENT (IOC);  Surgeon: [name], MD;  Location: [name] ORS;  Service: Ophthalmology;  Laterality: Left;  . cataract surgery  2012  . colon surgery     cancer removal  . COLON SURGERY     colostomy  . CORONARY ANGIOPLASTY    . INSERT / REPLACE / REMOVE PACEMAKER  2011   just a pacer  . KNEE ARTHROSCOPY Right 1980's  . PACEMAKER PLACEMENT  01/17/2010  . TOTAL KNEE ARTHROPLASTY Right 01/14/2016   Procedure: TOTAL KNEE ARTHROPLASTY;  Surgeon: [name], MD;  Location: [name] ORS;  Service: Orthopedics;  Laterality: Right;     reports that he quit smoking about 7 years ago. His smoking use included Cigarettes. He has a 30.00 pack-year smoking history. He has never used smokeless tobacco. He reports that he does not drink alcohol or use drugs.   family history includes Cancer in his mother; Heart attack in his father; Heart disease in his father; Hyperlipidemia in his father and mother; Hypertension in his father and mother.   Outpatient Medications Prior to Visit  Medication Sig Dispense Refill  . albuterol (PROAIR HFA) 108 (90 Base) MCG/ACT inhaler Inhale 2 puffs into the lungs every 6 (six) hours as needed for wheezing or shortness of breath. 1 Inhaler 6  . albuterol (PROVENTIL) (2.5 MG/3ML) 0.083% nebulizer solution USE 1 VIAL VIA BY NEBULIZER EVERY 6 HOURS AS NEEDED FOR  WHEEZING OR SHORTNESS OF BREATH 360 mL 4  . amLODipine (NORVASC) 2.5 MG tablet Take 2.5 mg by mouth daily.    [name] arformoterol (BROVANA) 15 MCG/2ML NEBU Take 15 mcg by nebulization 2 (two) times daily. Take 2 mLs (15 mcg total) by nebulization 2 (two) times daily. DX code 493.20 Every morning and 1800    . aspirin EC 81 MG tablet Take 81 mg by mouth daily.    [name] atorvastatin (LIPITOR) 40 MG tablet Take 40 mg by mouth at bedtime. After 1900    . budesonide (PULMICORT) 0.5 MG/2ML nebulizer solution Take 2 mLs (0.5 mg  total) by nebulization 2 (two) times daily. 240 mL 2  . Cholecalciferol (VITAMIN D) 2000 UNITS tablet Take 2,000 Units by mouth daily.    . degarelix (FIRMAGON) 120 MG injection Inject into the skin every 3 (three) months.     . INCRUSE ELLIPTA 62.5 MCG/INH AEPB INHALE ONE PUFF INTO THE LUNGS DAILY 30 each 5  . isosorbide mononitrate (IMDUR) 120 MG 24 hr tablet Take one tablet (120 mg) by mouth at bedtime 90 tablet 3  . metoprolol tartrate (LOPRESSOR) 25 MG tablet Take one tablet (25 mg) in the morning and two tablets (50 mg) in the evening (Patient taking differently: Take 25 mg by mouth 3 (three) times daily. ) 90 tablet 11  . nitroGLYCERIN (NITROSTAT) 0.4 MG SL tablet Place 1 tablet (0.4 mg total) under the tongue every 5 (five) minutes as needed for chest pain. 20 tablet 12  . nystatin cream (MYCOSTATIN) Apply 1 application topically 2 (two) times daily as needed. Rash/skin irritation.    [name] PARoxetine (PAXIL) 10 MG tablet Take 10 mg by mouth daily.    . traMADol (ULTRAM) 50 MG tablet Take 1-2 tablets (50-100 mg total) by mouth every 4 (four) hours as needed for moderate pain. 40 tablet 0   No facility-administered medications prior to visit.     Patient takes amlodipine 2.5 mg daily Metoprolol is actually taken as 25 mg once a morning one at noon , one at night night.  Allergies: Patient has no known allergies.    PHYSICAL EXAM: VS:  BP (!) 144/72 (BP Location: Left Arm, Patient  Position: Sitting, Cuff Size: Normal)   Pulse 67   Ht 5\' 10"  (1.778 m)   Wt 214 lb 4 oz (97.2 kg)   BMI 30.74 kg/m  , Body mass index is 30.74 kg/m. Wt Readings from Last 3 Encounters:  05/09/16 214 lb 4 oz (97.2 kg)  04/27/16 209 lb (94.8 kg)  04/18/16 209 lb 4 oz (94.9 kg)  GENERAL:  well developed, well nourished, obese, not in acute distress HEENT: normocephalic, pink conjunctivae, anicteric sclerae, no xanthelasma, normal dentition, oropharynx clear NECK:  no neck vein engorgement, JVP normal, no hepatojugular reflux, carotid upstroke brisk and symmetric, no bruit, no thyromegaly, no lymphadenopathy LUNGS:  good respiratory effort, clear to auscultation bilaterally CV:  PMI not displaced, no thrills, no lifts, S1 and S2 within normal limits, no palpable S3 or S4, no murmurs, no rubs, no gallops ABD:  Soft, nontender, nondistended, normoactive bowel sounds, no abdominal aortic bruit, no hepatomegaly, no splenomegaly MS: nontender back, no kyphosis, no scoliosis, no joint deformities EXT:  1+ DP/PT pulses, no edema, no varicosities, no cyanosis, no clubbing SKIN: warm, nondiaphoretic, normal turgor, no ulcers NEUROPSYCH: alert, oriented to person, place, and time, sensory/motor grossly intact, normal mood, appropriate affect   Recent Labs: 04/27/2016: BUN 24; Creatinine, Ser 0.78; Hemoglobin 14.6; Platelets 259; Potassium 4.2; Sodium 138   Lipid Panel No results found for: CHOL, TRIG, HDL, CHOLHDL, VLDL, LDLCALC, LDLDIRECT  03/29/2016: TC 177 TG 116 LDL 94 HDL 60.3  Other studies Reviewed:  EKG:  The ekg from 10/14/2015 was personally reviewed by me and it revealed atrial paced   Additional studies/ records that were reviewed personally reviewed by me today include: Left heart cath 11/03/2014:  LM lesion, 30% stenosed.  Dist Cx lesion, 60% stenosed.  Prox LAD to Mid LAD lesion, 20% stenosed.  Ost Cx to Prox Cx lesion, 50% stenosed.  Prox Cx lesion, 10% stenosed.  The lesion  was previously treated with a stent (unknown type) greater than two years ago.  2nd Mrg lesion, 50% stenosed.  Ost RCA lesion, 30% stenosed.  Mid RCA lesion, 40% stenosed.  Dist RCA lesion, 60% stenosed.  A drug-eluting stent was placed.  Post Atrio-2 lesion, 40% stenosed. The lesion was previously treated with angioplasty .  Post Atrio-1 lesion, 99% stenosed.  There is mild left ventricular systolic dysfunction.   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 There is TIMI 2 flow in that area. This is the culprit for non-ST elevation myocardial infarction post PCI.  2. Mildly reduced LV systolic function with basal inferior wall hypokinesis and ejection fraction of 45-50%. Normal left ventricular end-diastolic pressure.  Echo 11/04/2015: Left ventricle: The cavity size was normal. Systolic function was   normal. The estimated ejection fraction was in the range of 55%   to 60%. Regional wall motion abnormalities cannot be excluded.   Doppler parameters are consistent with abnormal left ventricular   relaxation (grade 1 diastolic dysfunction). - Left atrium: The atrium was normal in size. - Right ventricle: Systolic function was normal. - Pulmonary arteries: Systolic pressure was within the normal   range.  Impressions:  - PVCs in a bigeminal pattern. Challenging image quality.  Nuclear stress test 12/14/2015:  Abnormal, intermediate risk study.  No significant ischemia.  There is a moderate in size, moderate in severity basal and mid inferior fixed defect, which may reflect scar and/or artifact from diaphragmatic attenuation and adjacent gut activity.  There is a small in size, severe fixed defect involving the apex that most likely represents apical thinning and/or attenuation artifact. Element of scar cannot be excluded.  The left ventricular ejection  fraction is moderately decreased (41%).  Sensitivity and specificity of the study is degraded by extracardiac activity, motion, and attentuation.  Carotid u/s 04/13/2016: Heterogeneous plaque, bilaterally. Chronically occluded RICA. 54-27% LICA stenosis, with stable velocities. >50% RECA stenosis. Patent vertebral arteries with antegrade flow. Normal subclavian arteries, bilaterally. 1 year f/u   ASSESSMENT AND PLAN: CAD status post multiple PCI NSTEMI 10/2014 Angina episode recently. Continue medical therapy, patient finished dual antiplatelet therapy in October. Continuing on aspirin 81 mg by mouth daily, atorvastatin, Imdur already at 120mg  qd, metoprolol. LVEF normal at 11/04/2015 echo. Consider nuclear stress test.  Resend NTG SL script, NTG SL prn for chest pain. Patient instructed to call 911 for unrelenting chest pain.   Hyperlipidemia LDL goal is less than 70 due to CAD Pt now amenable to increase Atorvastatin to 80mg  qhs. Rpt cmp/flp in 3 mo.  History of hypertension with orthostatic changes Recommendations the same as from last visit 04/18/2016:  He has been dealing with this for a long time . Knows not to take Flomax or similar drugs.  He has been stable on current meds for now.   history of atrial flutter for which he underwent ablation 10/16   Recommendations the same as from last visit 04/18/3016: Sinus node dysfunction status post pacemaker Patient follows up with Dr. Caryl Comes  Carotid artery disease Chronically occluded RICA. 06-23% LICA stenosis, with stable velocities. >50% RECA stenosis. Patent vertebral arteries with antegrade flow. Normal subclavian arteries, bilaterally. 1 year f/u Cont asa, statin therapy, LDL goal < 70  Claudication Rec ABI/LEA. Pt will consider this for next time, not right now.   Current medicines are reviewed at length with the patient today.  The patient does not have concerns regarding medicines.  Labs/ tests ordered  today include:  Orders Placed This Encounter  Procedures  . NM Myocar Multi W/Spect W/Wall Motion / EF  . EKG 12-Lead   I had a lengthy and detailed discussion with the patient regarding diagnoses, prognosis, diagnostic options, treatment options , and side effects of medications.   I counseled the patient on importance of lifestyle modification including heart healthy diet, regular physical activity.  I spent at least 40 minutes with the patient today and more than 50% of the time was spent counseling the patient and coordinating care.     Disposition:   FU with undersigned post stress test Signed, Wende Bushy, MD  05/09/2016 12:41 PM    [place]  This note was generated in part with voice recognition software and I apologize for any typographical errors that were not detected and corrected.

## 2016-05-09 ENCOUNTER — Telehealth: Payer: Self-pay | Admitting: Internal Medicine

## 2016-05-09 ENCOUNTER — Telehealth: Payer: Self-pay | Admitting: Cardiology

## 2016-05-09 ENCOUNTER — Encounter: Payer: Self-pay | Admitting: Cardiology

## 2016-05-09 ENCOUNTER — Ambulatory Visit (INDEPENDENT_AMBULATORY_CARE_PROVIDER_SITE_OTHER): Payer: Medicare Other | Admitting: Cardiology

## 2016-05-09 VITALS — BP 144/72 | HR 67 | Ht 70.0 in | Wt 214.2 lb

## 2016-05-09 DIAGNOSIS — E784 Other hyperlipidemia: Secondary | ICD-10-CM | POA: Diagnosis not present

## 2016-05-09 DIAGNOSIS — I2510 Atherosclerotic heart disease of native coronary artery without angina pectoris: Secondary | ICD-10-CM | POA: Diagnosis not present

## 2016-05-09 DIAGNOSIS — R079 Chest pain, unspecified: Secondary | ICD-10-CM

## 2016-05-09 DIAGNOSIS — I951 Orthostatic hypotension: Secondary | ICD-10-CM

## 2016-05-09 DIAGNOSIS — I10 Essential (primary) hypertension: Secondary | ICD-10-CM

## 2016-05-09 DIAGNOSIS — E7849 Other hyperlipidemia: Secondary | ICD-10-CM

## 2016-05-09 MED ORDER — ATORVASTATIN CALCIUM 80 MG PO TABS: 80.0000 mg | ORAL_TABLET | Freq: Every day | ORAL | 3 refills | Status: AC

## 2016-05-09 MED ORDER — NITROGLYCERIN 0.4 MG SL SUBL: 0.4000 mg | SUBLINGUAL_TABLET | SUBLINGUAL | 3 refills | Status: AC | PRN

## 2016-05-09 NOTE — Telephone Encounter (Signed)
Spoke with patient and let him know that Dr. Yvone Neu had wanted to make some changes. She requested that he increase his atorvastatin up to 80 mg at bedtime, refill for nitroglycerin, and CMP/FLP in 3 months. Let him know that I would mail him the lab slips so that he can go to the [place] [facility] to have them done. Instructed him to not eat or drink after midnight before going in to have them done. He verbalized understanding, agreement with plan, and had no further questions at this time.

## 2016-05-09 NOTE — Patient Instructions (Addendum)
Testing/Procedures: Donald Juarez  Your caregiver has ordered a Stress Test with nuclear imaging. The purpose of this test is to evaluate the blood supply to your heart muscle. This procedure is referred to as a "Non-Invasive Stress Test." This is because other than having an IV started in your vein, nothing is inserted or "invades" your body. Cardiac stress tests are done to find areas of poor blood flow to the heart by determining the extent of coronary artery disease (CAD). Some patients exercise on a treadmill, which naturally increases the blood flow to your heart, while others who are  unable to walk on a treadmill due to physical limitations have a pharmacologic/chemical stress agent called Lexiscan . This medicine will mimic walking on a treadmill by temporarily increasing your coronary blood flow.   Please note: these test may take anywhere between 2-4 hours to complete  PLEASE REPORT TO [place] AT THE FIRST DESK WILL DIRECT YOU WHERE TO GO  Date of Procedure:_Tuesday May 16, 2016 at 08:00AM__  Arrival Time for Procedure:_Arrive at 07:45AM to register__  Instructions regarding medication:   __X__:  Hold metoprolol the night before procedure and morning of procedure   PLEASE NOTIFY THE OFFICE AT LEAST 24 HOURS IN ADVANCE IF YOU ARE UNABLE TO KEEP YOUR APPOINTMENT.  [phone] AND  PLEASE NOTIFY NUCLEAR MEDICINE AT [facility] AT LEAST 24 HOURS IN ADVANCE IF YOU ARE UNABLE TO KEEP YOUR APPOINTMENT. [phone]  How to prepare for your Myoview test:  1. Do not eat or drink after midnight 2. No caffeine for 24 hours prior to test 3. No smoking 24 hours prior to test. 4. Your medication may be taken with water.  If your doctor stopped a medication because of this test, do not take that medication. 5. Ladies, please do not wear dresses.  Skirts or pants are appropriate. Please wear a short sleeve shirt. 6. No perfume, cologne or lotion. 7. Wear  comfortable walking shoes. No heels!    Follow-Up: Your physician recommends that you schedule a follow-up appointment after stress test.   It was a pleasure seeing you today here in the office. Please do not hesitate to give [place] a call back if you have any further questions. Padre Ranchitos, BSN    Pharmacologic Stress Electrocardiogram Introduction A pharmacologic stress electrocardiogram is a heart (cardiac) test that uses nuclear imaging to evaluate the blood supply to your heart. This test may also be called a pharmacologic stress electrocardiography. Pharmacologic means that a medicine is used to increase your heart rate and blood pressure. This stress test is done to find areas of poor blood flow to the heart by determining the extent of coronary artery disease (CAD). Some people exercise on a treadmill, which naturally increases the blood flow to the heart. For those people unable to exercise on a treadmill, a medicine is used. This medicine stimulates your heart and will cause your heart to beat harder and more quickly, as if you were exercising. Pharmacologic stress tests can help determine:  The adequacy of blood flow to your heart during increased levels of activity in order to clear you for discharge home.  The extent of coronary artery blockage caused by CAD.  Your prognosis if you have suffered a heart attack.  The effectiveness of cardiac procedures done, such as an angioplasty, which can increase the circulation in your coronary arteries.  Causes of chest pain or pressure. [place]  ABOUT:  Any allergies you have.  All medicines you are taking, including vitamins, herbs, eye drops, creams, and over-the-counter medicines.  Previous problems you or members of your family have had with the use of anesthetics.  Any blood disorders you have.  Previous surgeries you have had.  Medical conditions you have.  Possibility of  pregnancy, if this applies.  If you are currently breastfeeding. RISKS AND COMPLICATIONS Generally, this is a safe procedure. However, as with any procedure, complications can occur. Possible complications include:  You develop pain or pressure in the following areas:  Chest.  Jaw or neck.  Between your shoulder blades.  Radiating down your left arm.  Headache.  Dizziness or light-headedness.  Shortness of breath.  Increased or irregular heartbeat.  Low blood pressure.  Nausea or vomiting.  Flushing.  Redness going up the arm and slight pain during injection of medicine.  Heart attack (rare). BEFORE THE PROCEDURE  Avoid all forms of caffeine for 24 hours before your test or as directed by your health care provider. This includes coffee, tea (even decaffeinated tea), caffeinated sodas, chocolate, cocoa, and certain pain medicines.  Follow your health care provider's instructions regarding eating and drinking before the test.  Take your medicines as directed at regular times with water unless instructed otherwise. Exceptions may include:  If you have diabetes, ask how you are to take your insulin or pills. It is common to adjust insulin dosing the morning of the test.  If you are taking beta-blocker medicines, it is important to talk to your health care provider about these medicines well before the date of your test. Taking beta-blocker medicines may interfere with the test. In some cases, these medicines need to be changed or stopped 24 hours or more before the test.  If you wear a nitroglycerin patch, it may need to be removed prior to the test. Ask your health care provider if the patch should be removed before the test.  If you use an inhaler for any breathing condition, bring it with you to the test.  If you are an outpatient, bring a snack so you can eat right after the stress phase of the test.  Do not smoke for 4 hours prior to the test or as directed by your  health care provider.  Do not apply lotions, powders, creams, or oils on your chest prior to the test.  Wear comfortable shoes and clothing. Let your health care provider know if you were unable to complete or follow the preparations for your test. PROCEDURE  Multiple patches (electrodes) will be put on your chest. If needed, small areas of your chest may be shaved to get better contact with the electrodes. Once the electrodes are attached to your body, multiple wires will be attached to the electrodes, and your heart rate will be monitored.  An IV access will be started. A nuclear trace (isotope) is given. The isotope may be given intravenously, or it may be swallowed. Nuclear refers to several types of radioactive isotopes, and the nuclear isotope lights up the arteries so that the nuclear images are clear. The isotope is absorbed by your body. This results in low radiation exposure.  A resting nuclear image is taken to show how your heart functions at rest.  A medicine is given through the IV access.  A second scan is done about 1 hour after the medicine injection and determines how your heart functions under stress.  During this stress phase, you will be  connected to an electrocardiogram machine. Your blood pressure and oxygen levels will be monitored. What to expect after the procedure  Your heart rate and blood pressure will be monitored after the test.  You may return to your normal schedule, including diet,activities, and medicines, unless your health care provider tells you otherwise. This information is not intended to replace advice given to you by your health care provider. Make sure you discuss any questions you have with your health care provider. Document Released: 06/04/2008 Document Revised: 06/24/2015 Document Reviewed: 07/26/2015 Elsevier Interactive Patient Education  2017 [employer].

## 2016-05-09 NOTE — Telephone Encounter (Signed)
Patient wants to make sure remote check will work while out of town.  Confirmed with heather this will work . Patient taking machine with him out of town.  Patient aware he can call office day of reading and check on download to confirm transmission.

## 2016-05-10 ENCOUNTER — Encounter: Payer: Self-pay | Admitting: *Deleted

## 2016-05-10 NOTE — Telephone Encounter (Signed)
Letter sent to patient regarding repeat labs in 3 months along with slips and location information.

## 2016-05-16 ENCOUNTER — Encounter
Admission: RE | Admit: 2016-05-16 | Discharge: 2016-05-16 | Disposition: A | Discharge status: Home / Self Care | Payer: Medicare Other | Source: Ambulatory Visit | Attending: Cardiology | Admitting: Cardiology

## 2016-05-16 DIAGNOSIS — R079 Chest pain, unspecified: Secondary | ICD-10-CM

## 2016-05-16 LAB — NM MYOCAR MULTI W/SPECT W/WALL MOTION / EF
CHL CUP MPHR: 149 {beats}/min
CHL CUP STRESS STAGE 1 HR: 62 {beats}/min
CHL CUP STRESS STAGE 2 GRADE: 0 %
CHL CUP STRESS STAGE 3 GRADE: 0 %
CHL CUP STRESS STAGE 3 HR: 63 {beats}/min
CHL CUP STRESS STAGE 4 HR: 61 {beats}/min
CHL CUP STRESS STAGE 5 SPEED: 0 mph
CSEPEDS: 0 s
CSEPHR: 44 %
Estimated workload: 1 METS
Exercise duration (min): 0 min
LV sys vol: 44 mL
LVDIAVOL: 135 mL (ref 62–150)
NUC STRESS TID: 1.03
Peak HR: 63 {beats}/min
Percent of predicted max HR: 42 %
Rest HR: 62 {beats}/min
Stage 1 Grade: 0 %
Stage 1 Speed: 0 mph
Stage 2 HR: 62 {beats}/min
Stage 2 Speed: 0 mph
Stage 3 Speed: 0 mph
Stage 4 Grade: 0 %
Stage 4 Speed: 0 mph
Stage 5 Grade: 0 %
Stage 5 HR: 60 {beats}/min

## 2016-05-16 MED ORDER — TECHNETIUM TC 99M TETROFOSMIN IV KIT
13.0000 | PACK | Freq: Once | INTRAVENOUS | Status: AC | PRN
  Administered 2016-05-16: 12.73 via INTRAVENOUS

## 2016-05-16 MED ORDER — REGADENOSON 0.4 MG/5ML IV SOLN
0.4000 mg | Freq: Once | INTRAVENOUS | Status: AC
  Administered 2016-05-16: 0.4 mg via INTRAVENOUS

## 2016-05-16 MED ORDER — TECHNETIUM TC 99M TETROFOSMIN IV KIT
30.8500 | PACK | Freq: Once | INTRAVENOUS | Status: AC | PRN
  Administered 2016-05-16: 30.85 via INTRAVENOUS

## 2016-05-19 ENCOUNTER — Telehealth: Payer: Self-pay | Admitting: Cardiology

## 2016-05-19 NOTE — Telephone Encounter (Signed)
Left voicemail message to call back regarding results. See results notes

## 2016-05-19 NOTE — Telephone Encounter (Signed)
Patient returning call re testing please call again .

## 2016-05-24 ENCOUNTER — Encounter
Admission: RE | Disposition: A | Discharge status: Home / Self Care | Payer: Self-pay | Source: Ambulatory Visit | Attending: Surgery

## 2016-05-24 ENCOUNTER — Ambulatory Visit: Payer: Medicare Other | Admitting: Anesthesiology

## 2016-05-24 ENCOUNTER — Ambulatory Visit
Admission: RE | Admit: 2016-05-24 | Discharge: 2016-05-24 | Disposition: A | Discharge status: Home / Self Care | Payer: Medicare Other | Source: Ambulatory Visit | Attending: Surgery | Admitting: Surgery

## 2016-05-24 ENCOUNTER — Encounter: Payer: Self-pay | Admitting: *Deleted

## 2016-05-24 DIAGNOSIS — Z7951 Long term (current) use of inhaled steroids: Secondary | ICD-10-CM | POA: Insufficient documentation

## 2016-05-24 DIAGNOSIS — I495 Sick sinus syndrome: Secondary | ICD-10-CM | POA: Insufficient documentation

## 2016-05-24 DIAGNOSIS — I252 Old myocardial infarction: Secondary | ICD-10-CM | POA: Insufficient documentation

## 2016-05-24 DIAGNOSIS — Z8546 Personal history of malignant neoplasm of prostate: Secondary | ICD-10-CM | POA: Diagnosis not present

## 2016-05-24 DIAGNOSIS — Z87891 Personal history of nicotine dependence: Secondary | ICD-10-CM | POA: Insufficient documentation

## 2016-05-24 DIAGNOSIS — F419 Anxiety disorder, unspecified: Secondary | ICD-10-CM | POA: Insufficient documentation

## 2016-05-24 DIAGNOSIS — Z79899 Other long term (current) drug therapy: Secondary | ICD-10-CM | POA: Insufficient documentation

## 2016-05-24 DIAGNOSIS — Z86010 Personal history of colonic polyps: Secondary | ICD-10-CM | POA: Diagnosis not present

## 2016-05-24 DIAGNOSIS — Z951 Presence of aortocoronary bypass graft: Secondary | ICD-10-CM | POA: Insufficient documentation

## 2016-05-24 DIAGNOSIS — I739 Peripheral vascular disease, unspecified: Secondary | ICD-10-CM | POA: Insufficient documentation

## 2016-05-24 DIAGNOSIS — Z8249 Family history of ischemic heart disease and other diseases of the circulatory system: Secondary | ICD-10-CM | POA: Insufficient documentation

## 2016-05-24 DIAGNOSIS — Z9049 Acquired absence of other specified parts of digestive tract: Secondary | ICD-10-CM | POA: Insufficient documentation

## 2016-05-24 DIAGNOSIS — K635 Polyp of colon: Secondary | ICD-10-CM | POA: Diagnosis not present

## 2016-05-24 DIAGNOSIS — I4892 Unspecified atrial flutter: Secondary | ICD-10-CM | POA: Insufficient documentation

## 2016-05-24 DIAGNOSIS — J449 Chronic obstructive pulmonary disease, unspecified: Secondary | ICD-10-CM | POA: Insufficient documentation

## 2016-05-24 DIAGNOSIS — I2510 Atherosclerotic heart disease of native coronary artery without angina pectoris: Secondary | ICD-10-CM | POA: Insufficient documentation

## 2016-05-24 DIAGNOSIS — Z933 Colostomy status: Secondary | ICD-10-CM | POA: Diagnosis not present

## 2016-05-24 DIAGNOSIS — E785 Hyperlipidemia, unspecified: Secondary | ICD-10-CM | POA: Insufficient documentation

## 2016-05-24 DIAGNOSIS — Z96651 Presence of right artificial knee joint: Secondary | ICD-10-CM | POA: Insufficient documentation

## 2016-05-24 DIAGNOSIS — I10 Essential (primary) hypertension: Secondary | ICD-10-CM | POA: Diagnosis not present

## 2016-05-24 DIAGNOSIS — R569 Unspecified convulsions: Secondary | ICD-10-CM | POA: Diagnosis not present

## 2016-05-24 DIAGNOSIS — I6523 Occlusion and stenosis of bilateral carotid arteries: Secondary | ICD-10-CM | POA: Diagnosis not present

## 2016-05-24 DIAGNOSIS — Z1211 Encounter for screening for malignant neoplasm of colon: Secondary | ICD-10-CM | POA: Insufficient documentation

## 2016-05-24 DIAGNOSIS — Z7982 Long term (current) use of aspirin: Secondary | ICD-10-CM | POA: Insufficient documentation

## 2016-05-24 DIAGNOSIS — Z85048 Personal history of other malignant neoplasm of rectum, rectosigmoid junction, and anus: Secondary | ICD-10-CM | POA: Diagnosis not present

## 2016-05-24 DIAGNOSIS — K5790 Diverticulosis of intestine, part unspecified, without perforation or abscess without bleeding: Secondary | ICD-10-CM | POA: Insufficient documentation

## 2016-05-24 HISTORY — PX: COLONOSCOPY WITH PROPOFOL: SHX5780

## 2016-05-24 SURGERY — COLONOSCOPY WITH PROPOFOL
Anesthesia: General

## 2016-05-24 MED ORDER — EPHEDRINE SULFATE 50 MG/ML IJ SOLN
INTRAMUSCULAR | Status: DC | PRN
  Administered 2016-05-24: 12.5 mg via INTRAVENOUS

## 2016-05-24 MED ORDER — FENTANYL CITRATE (PF) 100 MCG/2ML IJ SOLN
INTRAMUSCULAR | Status: DC | PRN
  Administered 2016-05-24: 50 ug via INTRAVENOUS

## 2016-05-24 MED ORDER — MIDAZOLAM HCL 2 MG/2ML IJ SOLN
INTRAMUSCULAR | Status: DC | PRN
  Administered 2016-05-24: 1 mg via INTRAVENOUS

## 2016-05-24 MED ORDER — PROPOFOL 500 MG/50ML IV EMUL
INTRAVENOUS | Status: AC
  Filled 2016-05-24: qty 50

## 2016-05-24 MED ORDER — MIDAZOLAM HCL 2 MG/2ML IJ SOLN
INTRAMUSCULAR | Status: AC
  Filled 2016-05-24: qty 2

## 2016-05-24 MED ORDER — PROPOFOL 10 MG/ML IV BOLUS
INTRAVENOUS | Status: DC | PRN
  Administered 2016-05-24: 20 mg via INTRAVENOUS
  Administered 2016-05-24: 30 mg via INTRAVENOUS
  Administered 2016-05-24: 20 mg via INTRAVENOUS

## 2016-05-24 MED ORDER — FENTANYL CITRATE (PF) 100 MCG/2ML IJ SOLN
INTRAMUSCULAR | Status: AC
  Filled 2016-05-24: qty 2

## 2016-05-24 MED ORDER — VANCOMYCIN HCL IN DEXTROSE 1-5 GM/200ML-% IV SOLN
1000.0000 mg | Freq: Once | INTRAVENOUS | Status: AC
Start: 2016-05-24 — End: 2016-05-24
  Administered 2016-05-24 (×2): 1000 mg via INTRAVENOUS

## 2016-05-24 MED ORDER — SODIUM CHLORIDE 0.9 % IV SOLN
INTRAVENOUS | Status: DC
  Administered 2016-05-24 (×2): via INTRAVENOUS

## 2016-05-24 MED ORDER — PROPOFOL 10 MG/ML IV BOLUS
INTRAVENOUS | Status: AC
  Filled 2016-05-24: qty 20

## 2016-05-24 MED ORDER — PROPOFOL 500 MG/50ML IV EMUL
INTRAVENOUS | Status: DC | PRN
  Administered 2016-05-24: 140 ug/kg/min via INTRAVENOUS

## 2016-05-24 NOTE — Op Note (Signed)
OPERATIVE REPORT  PREOPERATIVE  DIAGNOSIS: . History of rectal cancer, colonic polyp  POSTOPERATIVE DIAGNOSIS: . History of rectal cancer, colonic polyp, diverticulosis  PROCEDURE: . Colonoscopy, biopsy with cautery  ANESTHESIA:  General  SURGEON: Rochel Brome  MD   INDICATIONS: . He has a history of rectal cancer and abdominal perineal resection. Last colonoscopy was 2012 and did have 1 polyp removed. He came in now prepared for follow-up colonoscopy  With the patient on the stretcher in the supine position he was sedated and monitored by the anesthesia staff and given supplemental oxygen.  The colostomy bag was removed and index finger was used to examine the distal bowel and found no polyp or tumor. The Olympus video colonoscope was inserted and manipulated around to the cecum which was recognized by the ileocecal valve and the appendiceal lumen. Colon prep was fair. Some bilious fecal material was irrigated and aspirated. The scope was gradually pulled back examining the colonic mucosa. Multiple diverticuli were seen in the transverse and descending colon. As the scope was pulled back to a distance 17 cm from the stoma 2 small benign-appearing polyps were encountered and were removed with biopsy and cautery. Bleeding was very minimal and hemostasis subsequently intact. Specimens were submitted in formalin for routine pathology. The scope was removed. The colostomy bag was reapplied.  The patient appear to be in satisfactory condition and then was prepared for transfer to the recovery room  [place].D.

## 2016-05-24 NOTE — Anesthesia Postprocedure Evaluation (Signed)
Anesthesia Post Note  Patient: Donald Juarez  Procedure(s) Performed: Procedure(s) (LRB): COLONOSCOPY WITH PROPOFOL (N/A)  Patient location during evaluation: Endoscopy Anesthesia Type: General Level of consciousness: awake and alert Pain management: pain level controlled Vital Signs Assessment: post-procedure vital signs reviewed and stable Respiratory status: spontaneous breathing, nonlabored ventilation, respiratory function stable and patient connected to nasal cannula oxygen Cardiovascular status: blood pressure returned to baseline and stable Postop Assessment: no signs of nausea or vomiting Anesthetic complications: no     Last Vitals:  Vitals:   05/24/16 0927 05/24/16 0937  BP: (!) 159/74 (!) 164/84  Pulse: 61 62  Resp: 19 17  Temp:      Last Pain:  Vitals:   05/24/16 0907  TempSrc: Tympanic                 Martha Clan

## 2016-05-24 NOTE — Anesthesia Preprocedure Evaluation (Signed)
Anesthesia Evaluation  Patient identified by MRN, date of birth, ID band Patient awake    Reviewed: Allergy & Precautions, H&P , NPO status , Patient's Chart, lab work & pertinent test results  History of Anesthesia Complications Negative for: history of anesthetic complications  Airway Mallampati: III  TM Distance: <3 FB Neck ROM: limited    Dental no notable dental hx. (+) Poor Dentition, Missing, Upper Dentures, Lower Dentures   Pulmonary neg shortness of breath, asthma , COPD, neg recent URI, former smoker,           Cardiovascular Exercise Tolerance: Good hypertension, (-) angina+ CAD, + Past MI, + Cardiac Stents and + Peripheral Vascular Disease  (-) CABG and (-) DOE + dysrhythmias + pacemaker (-) Valvular Problems/Murmurs     Neuro/Psych Seizures -, Well Controlled,  PSYCHIATRIC DISORDERS    GI/Hepatic negative GI ROS, Neg liver ROS, neg GERD  ,  Endo/Other  negative endocrine ROS  Renal/GU negative Renal ROS     Musculoskeletal  (+) Arthritis ,   Abdominal   Peds  Hematology negative hematology ROS (+)   Anesthesia Other Findings Past Medical History: No date: Anginal pain (HCC)     Comment: feels this as jaw pain.  takes imdur for this.              rarely uses nitro. No date: Anxiety No date: Arthritis No date: Asthma No date: Bilateral claudication of lower limb (HCC) No date: Carotid stenosis     Comment: a. 07/2013 Carotid U/S: 100% RICA (pt prev               reported h/o 09%), LICA 32-35%, bilat ECA 50%,               normal vertebrals and subclavians bilat-->F/U               needed in 01/2014. No date: Cataract of left eye No date: Cataract, right     Comment: a. 10/2010 s/p cataract surgery  No date: COPD (chronic obstructive pulmonary disease) (* No date: Coronary artery disease     Comment: a. 06/1996 & 03/2005 Cath: non-obs dzs;  b.               07/2009 NSTEMI/Cath: LCX 99->PCI/DES  extending               into OM1;  c. 01/2013 Cath: patent LCX               stent-->Med Rx.; d. cath 03/2014: RPAV dzs->Med               rx; e. 10/2014 Cath/PCI: LM 30, LAD 20p/m, LCX               50ost/p, 10 ISR, 60d, OM1 min irregs, OM2 50,               RCA 30ost, 18m, 60d, RPDA 70(2.5x8 Xience DES),              RPAV 90small(PTCA); f. 10/2014 Relook Cath:               RPAV 99(Med Rx), EF 45-50. No date: Dysrhythmia     Comment: since ablation, flutter resolved. No date: Essential hypertension No date: Hyperkalemia No date: Hyperlipidemia 2015: Myocardial infarction     Comment: had stent inserted and then had heart attack               after. stent inserted due to blockage No  date: Paroxysmal atrial flutter ([place])     Comment: a. CHA2DS2VASc = 3 -> eliquis;  b. 07/2013               Echo: EF 50-55%, mildly dil LA.c. s/p RFCA               10/31/2013 by Dr Caryl Comes No date: Prostate cancer [facility]) No date: Pulmonary nodule, right     Comment: a. 0.7cm RLL - stable by CT 06/2013. No date: Rectal cancer ([place])     Comment: a. Status post colostomy No date: Seizures ([place])     Comment: a. last 30 years ago No date: Shingles     Comment: a. 09/2012. No date: SSS (sick sinus syndrome) ([place])     Comment: a. 12/2009 S/P MDT Adapta ADDR01 DC PPM, ser #              [id number] ([name]). No date: Syncope and collapse  Past Surgical History: 10/31/2013: ABLATION     Comment: CTI ablation by Dr Caryl Comes for atrial flutter 10/31/2013: ATRIAL FLUTTER ABLATION N/A     Comment: Procedure: ATRIAL FLUTTER ABLATION;  Surgeon:               [name], MD;  Location: [facility] CATH LAB;                Service: Cardiovascular;  Laterality: N/A; 07/2009: CARDIAC CATHETERIZATION     Comment: [facility] 01/2013: CARDIAC CATHETERIZATION     Comment: [facility] 03/2014: CARDIAC CATHETERIZATION     Comment: [facility] 11/02/2014: CARDIAC CATHETERIZATION N/A     Comment: Procedure: Left Heart Cath and Cors/Grafts                Angiography;  Surgeon: [name], MD;                Location: [place] CV LAB;  Service:               Cardiovascular;  Laterality: N/A; 11/02/2014: CARDIAC CATHETERIZATION N/A     Comment: Procedure: Coronary Stent Intervention;                Surgeon: [name], MD;  Location: [place] CV LAB;  Service: Cardiovascular;                Laterality: N/A; 11/03/2014: CARDIAC CATHETERIZATION N/A     Comment: Procedure: Left Heart Cath and Coronary               Angiography;  Surgeon: [name], MD;                Location: [place] CV LAB;  Service:               Cardiovascular;  Laterality: N/A; 2011: CARDIAC SURGERY     Comment: Stents placed  No date: CATARACT EXTRACTION Left 06/01/2014: CATARACT EXTRACTION W/PHACO Left     Comment: Procedure: CATARACT EXTRACTION PHACO AND               INTRAOCULAR LENS PLACEMENT (IOC);  Surgeon:               [name], MD;  Location: [name] ORS;                Service: Ophthalmology;  Laterality: Left; 2012: cataract surgery No date: colon surgery     Comment: cancer removal No  date: COLON SURGERY     Comment: colostomy No date: CORONARY ANGIOPLASTY 2011: INSERT / REPLACE / REMOVE PACEMAKER     Comment: just a pacer 1980's: KNEE ARTHROSCOPY Right 01/17/2010: PACEMAKER PLACEMENT  BMI    Body Mass Index:  27.69 kg/m      Reproductive/Obstetrics negative OB ROS                             Anesthesia Physical  Anesthesia Plan  ASA: III  Anesthesia Plan: General   Post-op Pain Management:    Induction:   Airway Management Planned:   Additional Equipment:   Intra-op Plan:   Post-operative Plan:   Informed Consent: I have reviewed the patients History and Physical, chart, labs and discussed the procedure including the risks, benefits and alternatives for the proposed anesthesia with the patient or authorized representative who has indicated his/her  understanding and acceptance.   Dental Advisory Given  Plan Discussed with: Anesthesiologist, CRNA and Surgeon  Anesthesia Plan Comments:         Anesthesia Quick Evaluation

## 2016-05-24 NOTE — Consult Note (Signed)
Donald Juarez is an 71 y.o. male.  HPI: He has a history of rectal cancer hadn't had abdominal perineal resection in 2003. Last colonoscopy was 2012 and one small polyp was removed. He reports no recent bleeding per colostomy. He had been anemic in December however hemoglobin and March was 14.5.  He was seen in the office in March and decision made for colonoscopy. He reports he did follow the instructions for bowel preparation and worked well. Past Medical History:  Diagnosis Date  . Anginal pain ([place])    feels this as jaw pain.  takes imdur for this. rarely uses nitro.  [name] Anxiety   . Arthritis   . Asthma   . Bilateral claudication of lower limb ([place])   . Carotid stenosis    a. 07/2013 Carotid U/S: 100% RICA (pt prev reported h/o 37%), LICA 16-96%, bilat ECA 50%, normal vertebrals and subclavians bilat-->F/U needed in 01/2014.  . Cataract of left eye   . Cataract, right    a. 10/2010 s/p cataract surgery   . COPD (chronic obstructive pulmonary disease) ([place])   . Coronary artery disease    a. 06/1996 & 03/2005 Cath: non-obs dzs;  b. 07/2009 NSTEMI/Cath: LCX 99->PCI/DES extending into OM1;  c. 01/2013 Cath: patent LCX stent-->Med Rx.; d. cath 03/2014: RPAV dzs->Med rx; e. 10/2014 Cath/PCI: LM 30, LAD 20p/m, LCX 50ost/p, 10 ISR, 60d, OM1 min irregs, OM2 50, RCA 30ost, 27m, 60d, RPDA 70(2.5x8 Xience DES), RPAV 90small(PTCA); f. 10/2014 Relook Cath: RPAV 99(Med Rx), EF 45-50.  [name] Dysrhythmia    since ablation, flutter resolved.  . Essential hypertension   . Hyperkalemia   . Hyperlipidemia   . Myocardial infarction ([place]) 2015   had stent inserted and then had heart attack after. stent inserted due to blockage  . Paroxysmal atrial flutter ([place])    a. CHA2DS2VASc = 3 -> eliquis;  b. 07/2013 Echo: EF 50-55%, mildly dil LA.c. s/p RFCA 10/31/2013 by Dr Caryl Comes  . Prostate cancer ([place])   . Pulmonary nodule, right    a. 0.7cm RLL - stable by CT 06/2013.  [name] Rectal cancer ([place])    a. Status post  colostomy  . Seizures ([place])    a. last 30 years ago  . Shingles    a. 09/2012.  . SSS (sick sinus syndrome) ([place])    a. 12/2009 S/P MDT Adapta ADDR01 DC PPM, ser # [id number] ([name]).  . Syncope and collapse     Past Surgical History:  Procedure Laterality Date  . ABLATION  10/31/2013   CTI ablation by Dr Caryl Comes for atrial flutter  . ATRIAL FLUTTER ABLATION N/A 10/31/2013   Procedure: ATRIAL FLUTTER ABLATION;  Surgeon: [name], MD;  Location: [facility] CATH LAB;  Service: Cardiovascular;  Laterality: N/A;  . CARDIAC CATHETERIZATION  07/2009   [facility]  . CARDIAC CATHETERIZATION  01/2013   [facility]  . CARDIAC CATHETERIZATION  03/2014   [facility]  . CARDIAC CATHETERIZATION N/A 11/02/2014   Procedure: Left Heart Cath and Cors/Grafts Angiography;  Surgeon: [name], MD;  Location: [place] CV LAB;  Service: Cardiovascular;  Laterality: N/A;  . CARDIAC CATHETERIZATION N/A 11/02/2014   Procedure: Coronary Stent Intervention;  Surgeon: [name], MD;  Location: [place] CV LAB;  Service: Cardiovascular;  Laterality: N/A;  . CARDIAC CATHETERIZATION N/A 11/03/2014   Procedure: Left Heart Cath and Coronary Angiography;  Surgeon: [name], MD;  Location: [place] CV LAB;  Service: Cardiovascular;  Laterality: N/A;  .  CARDIAC SURGERY  2011   Stents placed   . CATARACT EXTRACTION Left   . CATARACT EXTRACTION W/PHACO Left 06/01/2014   Procedure: CATARACT EXTRACTION PHACO AND INTRAOCULAR LENS PLACEMENT (IOC);  Surgeon: [name], MD;  Location: [name] ORS;  Service: Ophthalmology;  Laterality: Left;  . cataract surgery  2012  . colon surgery     cancer removal  . COLON SURGERY     colostomy  . CORONARY ANGIOPLASTY    . INSERT / REPLACE / REMOVE PACEMAKER  2011   just a pacer  . KNEE ARTHROSCOPY Right 1980's  . PACEMAKER PLACEMENT  01/17/2010  . TOTAL KNEE ARTHROPLASTY Right 01/14/2016   Procedure: TOTAL KNEE ARTHROPLASTY;  Surgeon: [name], MD;  Location: [name]  ORS;  Service: Orthopedics;  Laterality: Right;    Family History  Problem Relation Age of Onset  . Cancer Mother     'male cancer"  . Hypertension Mother   . Hyperlipidemia Mother   . Heart disease Father   . Heart attack Father   . Hypertension Father   . Hyperlipidemia Father     Social History:  reports that he quit smoking about 7 years ago. His smoking use included Cigarettes. He has a 30.00 pack-year smoking history. He has never used smokeless tobacco. He reports that he does not drink alcohol or use drugs.  Allergies: No Known Allergies  Medications: I have reviewed the patient's current medications.  No results found for this or any previous visit (from the past 48 hour(s)).  No results found.  ROS he reports a recent laceration of the left lower leg which occurred 2 weeks ago and has been managed by applying a dressing and changing and reports the laceration is healing satisfactorily. So had recent episode of hypertension and was treated in the emergency room. No recent chest pain. No other recent acute illness.  Blood pressure 127/83, pulse 77, temperature (!) 96.8 F (36 C), temperature source Tympanic, resp. rate 20, height 5\' 10"  (1.778 m), weight 89.8 kg (198 lb), SpO2 96 %. Physical Exam  GENERAL:  Awake alert and oriented and in no acute distress.  HEENT:  Head is normocephalic.  Pupils are equal reactive to light.  Extraocular movements are intact. Sclera is clear.  Pharynx is with evidence of yeast with white material on the soft palate  NECK:  Supple with no palpable mass and no adenopathy.  LUNGS:  Clear without rales rhonchi or wheezes.  HEART:  Regular rhythm S1-S2, without murmur.  ABDOMEN: The colostomy was noted in the left lower quadrant. The abdomen is moderately obese and soft and nontender.  EXTREMITIES: There is a dry dressing of the anterior tibial aspect left lower leg.   NEUROLOGIC: Awake alert and oriented  Assessment/Plan:  History  of rectal cancer and history of colonic polyp  A recommended colonoscopy. I have discussed the procedure with him  Rochel Brome 05/24/2016, 7:43 AM

## 2016-05-24 NOTE — Transfer of Care (Signed)
Immediate Anesthesia Transfer of Care Note  Patient: Donald Juarez  Procedure(s) Performed: Procedure(s): COLONOSCOPY WITH PROPOFOL (N/A)  Patient Location: PACU  Anesthesia Type:General  Level of Consciousness: awake  Airway & Oxygen Therapy: Patient Spontanous Breathing and Patient connected to nasal cannula oxygen  Post-op Assessment: Report given to RN and Post -op Vital signs reviewed and stable  Post vital signs: Reviewed and stable  Last Vitals:  Vitals:   05/24/16 0707 05/24/16 0907  BP: 127/83 133/72  Pulse: 77 61  Resp: 20 18  Temp: (!) 36 C 36.2 C    Last Pain:  Vitals:   05/24/16 0907  TempSrc: Tympanic         Complications: No apparent anesthesia complications

## 2016-05-24 NOTE — Anesthesia Post-op Follow-up Note (Cosign Needed)
Anesthesia QCDR form completed.        

## 2016-05-25 ENCOUNTER — Ambulatory Visit (INDEPENDENT_AMBULATORY_CARE_PROVIDER_SITE_OTHER): Payer: Medicare Other | Admitting: Cardiology

## 2016-05-25 ENCOUNTER — Encounter: Payer: Self-pay | Admitting: Cardiology

## 2016-05-25 ENCOUNTER — Telehealth: Payer: Self-pay | Admitting: Internal Medicine

## 2016-05-25 VITALS — BP 120/80 | HR 65 | Ht 70.0 in | Wt 210.0 lb

## 2016-05-25 DIAGNOSIS — E784 Other hyperlipidemia: Secondary | ICD-10-CM

## 2016-05-25 DIAGNOSIS — I10 Essential (primary) hypertension: Secondary | ICD-10-CM

## 2016-05-25 DIAGNOSIS — I4892 Unspecified atrial flutter: Secondary | ICD-10-CM

## 2016-05-25 DIAGNOSIS — I2510 Atherosclerotic heart disease of native coronary artery without angina pectoris: Secondary | ICD-10-CM

## 2016-05-25 DIAGNOSIS — E7849 Other hyperlipidemia: Secondary | ICD-10-CM

## 2016-05-25 NOTE — Progress Notes (Addendum)
Cardiology Office Note   Date:  05/25/2016   ID:  Donald Juarez, DOB 28-Jun-1945, MRN [id number]  Referring Doctor:  Sofie Hartigan, MD   Cardiologist:   Wende Bushy, MD   Reason for consultation:  Chief Complaint  Patient presents with  . other    Follow up from stress test. Meds reviewed by the pt. verbally.      History of Present Illness: Donald Juarez is a 71 y.o. male who presents for  Follow-up for history of CAD  Review medical records show: Patient has known coronary artery disease status post multiple stenting. Last heart cath was October 2016. He developed  NSTEMI post-PCI.  He has chronic angina which is jaw pain and some chest pain.   He also has a long history of hypertension with orthostatic drop in his blood pressure. He had episode of passing out likely due to significant drop in his blood pressure when he was placed on Flomax. Since discontinuation of that medication, he has had no more episodes of syncope.  04/27/2016: ER visit for CP. It felt like his"angina" CP/jaw pain. Took NTG SL (2 at once because he thought his meds were expired). Went to ER, ruled out. BP at the time at home was also > 938 systolic. No recurrence since then.  Since last visit, he has not had any CP. HE has not needed any NTG SL.   ROS:  Please see the history of present illness. Aside from mentioned under HPI, all other systems are reviewed and negative.     Past Medical History:  Diagnosis Date  . Anginal pain ([place])    feels this as jaw pain.  takes imdur for this. rarely uses nitro.  [name] Anxiety   . Arthritis   . Asthma   . Bilateral claudication of lower limb ([place])   . Carotid stenosis    a. 07/2013 Carotid U/S: 100% RICA (pt prev reported h/o 10%), LICA 17-51%, bilat ECA 50%, normal vertebrals and subclavians bilat-->F/U needed in 01/2014.  . Cataract of left eye   . Cataract, right    a. 10/2010 s/p cataract surgery   . COPD (chronic obstructive pulmonary disease)  ([place])   . Coronary artery disease    a. 06/1996 & 03/2005 Cath: non-obs dzs;  b. 07/2009 NSTEMI/Cath: LCX 99->PCI/DES extending into OM1;  c. 01/2013 Cath: patent LCX stent-->Med Rx.; d. cath 03/2014: RPAV dzs->Med rx; e. 10/2014 Cath/PCI: LM 30, LAD 20p/m, LCX 50ost/p, 10 ISR, 60d, OM1 min irregs, OM2 50, RCA 30ost, 60m, 60d, RPDA 70(2.5x8 Xience DES), RPAV 90small(PTCA); f. 10/2014 Relook Cath: RPAV 99(Med Rx), EF 45-50.  [name] Dysrhythmia    since ablation, flutter resolved.  . Essential hypertension   . Hyperkalemia   . Hyperlipidemia   . Myocardial infarction ([place]) 2015   had stent inserted and then had heart attack after. stent inserted due to blockage  . Paroxysmal atrial flutter ([place])    a. CHA2DS2VASc = 3 -> eliquis;  b. 07/2013 Echo: EF 50-55%, mildly dil LA.c. s/p RFCA 10/31/2013 by Dr Caryl Comes  . Prostate cancer ([place])   . Pulmonary nodule, right    a. 0.7cm RLL - stable by CT 06/2013.  [name] Rectal cancer ([place])    a. Status post colostomy  . Seizures ([place])    a. last 30 years ago  . Shingles    a. 09/2012.  . SSS (sick sinus syndrome) ([place])    a. 12/2009 S/P MDT [place]  PPM, ser # [id number] ([name]).  . Syncope and collapse     Past Surgical History:  Procedure Laterality Date  . ABLATION  10/31/2013   CTI ablation by Dr Caryl Comes for atrial flutter  . ATRIAL FLUTTER ABLATION N/A 10/31/2013   Procedure: ATRIAL FLUTTER ABLATION;  Surgeon: [name], MD;  [facility] CATH LAB;  Service: Cardiovascular;  Laterality: N/A;  . CARDIAC CATHETERIZATION  07/2009   [facility]  . CARDIAC CATHETERIZATION  01/2013   [facility]  . CARDIAC CATHETERIZATION  03/2014   [facility]  . CARDIAC CATHETERIZATION N/A 11/02/2014   Procedure: Left Heart Cath and Cors/Grafts Angiography;  Surgeon: [name], MD;  Location: [place] CV LAB;  Service: Cardiovascular;  Laterality: N/A;  . CARDIAC CATHETERIZATION N/A 11/02/2014   Procedure: Coronary Stent Intervention;  Surgeon: [name], MD;  Location:  [employer] CV LAB;  Service: Cardiovascular;  Laterality: N/A;  . CARDIAC CATHETERIZATION N/A 11/03/2014   Procedure: Left Heart Cath and Coronary Angiography;  Surgeon: [name], MD;  Location: [place] CV LAB;  Service: Cardiovascular;  Laterality: N/A;  . CARDIAC SURGERY  2011   Stents placed   . CATARACT EXTRACTION Left   . CATARACT EXTRACTION W/PHACO Left 06/01/2014   Procedure: CATARACT EXTRACTION PHACO AND INTRAOCULAR LENS PLACEMENT (IOC);  Surgeon: [name], MD;  Location: [name] ORS;  Service: Ophthalmology;  Laterality: Left;  . cataract surgery  2012  . colon surgery     cancer removal  . COLON SURGERY     colostomy  . COLONOSCOPY WITH PROPOFOL N/A 05/24/2016   Procedure: COLONOSCOPY WITH PROPOFOL;  Surgeon: [name], MD;  Location: [facility] ENDOSCOPY;  Service: Endoscopy;  Laterality: N/A;  . CORONARY ANGIOPLASTY    . INSERT / REPLACE / REMOVE PACEMAKER  2011   just a pacer  . KNEE ARTHROSCOPY Right 1980's  . PACEMAKER PLACEMENT  01/17/2010  . TOTAL KNEE ARTHROPLASTY Right 01/14/2016   Procedure: TOTAL KNEE ARTHROPLASTY;  Surgeon: [name], MD;  Location: [name] ORS;  Service: Orthopedics;  Laterality: Right;     reports that he quit smoking about 7 years ago. His smoking use included Cigarettes. He has a 30.00 pack-year smoking history. He has never used smokeless tobacco. He reports that he does not drink alcohol or use drugs.   family history includes Cancer in his mother; Heart attack in his father; Heart disease in his father; Hyperlipidemia in his father and mother; Hypertension in his father and mother.   Outpatient Medications Prior to Visit  Medication Sig Dispense Refill  . albuterol (PROAIR HFA) 108 (90 Base) MCG/ACT inhaler Inhale 2 puffs into the lungs every 6 (six) hours as needed for wheezing or shortness of breath. 1 Inhaler 6  . albuterol (PROVENTIL) (2.5 MG/3ML) 0.083% nebulizer solution USE 1 VIAL VIA BY NEBULIZER EVERY 6 HOURS  AS NEEDED FOR WHEEZING OR SHORTNESS OF BREATH 360 mL 4  . amLODipine (NORVASC) 2.5 MG tablet Take 2.5 mg by mouth daily.    [name] arformoterol (BROVANA) 15 MCG/2ML NEBU Take 15 mcg by nebulization 2 (two) times daily. Take 2 mLs (15 mcg total) by nebulization 2 (two) times daily. DX code 493.20 Every morning and 1800    . aspirin EC 81 MG tablet Take 81 mg by mouth daily.    [name] atorvastatin (LIPITOR) 80 MG tablet Take 1 tablet (80 mg total) by mouth daily. 90 tablet 3  . budesonide (PULMICORT) 0.5 MG/2ML nebulizer solution Take 2 mLs (0.5 mg  total) by nebulization 2 (two) times daily. 240 mL 2  . Cholecalciferol (VITAMIN D) 2000 UNITS tablet Take 2,000 Units by mouth daily.    . degarelix (FIRMAGON) 120 MG injection Inject into the skin every 3 (three) months.     . INCRUSE ELLIPTA 62.5 MCG/INH AEPB INHALE ONE PUFF INTO THE LUNGS DAILY 30 each 5  . isosorbide mononitrate (IMDUR) 120 MG 24 hr tablet Take one tablet (120 mg) by mouth at bedtime 90 tablet 3  . metoprolol tartrate (LOPRESSOR) 25 MG tablet Take one tablet (25 mg) in the morning and two tablets (50 mg) in the evening (Patient taking differently: Take 25 mg by mouth 3 (three) times daily. ) 90 tablet 11  . nitroGLYCERIN (NITROSTAT) 0.4 MG SL tablet Place 1 tablet (0.4 mg total) under the tongue every 5 (five) minutes as needed for chest pain. 25 tablet 3  . nystatin cream (MYCOSTATIN) Apply 1 application topically 2 (two) times daily as needed. Rash/skin irritation.    [name] PARoxetine (PAXIL) 10 MG tablet Take 10 mg by mouth daily.    . traMADol (ULTRAM) 50 MG tablet Take 1-2 tablets (50-100 mg total) by mouth every 4 (four) hours as needed for moderate pain. 40 tablet 0   No facility-administered medications prior to visit.     Patient takes amlodipine 2.5 mg daily Metoprolol is actually taken as 25 mg once a morning one at noon , one at night night.  Allergies: Patient has no known allergies.    PHYSICAL EXAM: VS:  BP 120/80 (BP  Location: Left Arm, Patient Position: Sitting, Cuff Size: Normal)   Pulse 65   Ht 5\' 10"  (1.778 m)   Wt 210 lb (95.3 kg)   BMI 30.13 kg/m  , Body mass index is 30.13 kg/m. Wt Readings from Last 3 Encounters:  05/25/16 210 lb (95.3 kg)  05/24/16 198 lb (89.8 kg)  05/09/16 214 lb 4 oz (97.2 kg)  GENERAL:  well developed, well nourished, obese, not in acute distress HEENT: normocephalic, pink conjunctivae, anicteric sclerae, no xanthelasma, normal dentition, oropharynx clear NECK:  no neck vein engorgement, JVP normal, no hepatojugular reflux, carotid upstroke brisk and symmetric, no bruit, no thyromegaly, no lymphadenopathy LUNGS:  good respiratory effort, clear to auscultation bilaterally CV:  PMI not displaced, no thrills, no lifts, S1 and S2 within normal limits, no palpable S3 or S4, no murmurs, no rubs, no gallops ABD:  Soft, nontender, nondistended, normoactive bowel sounds, no abdominal aortic bruit, no hepatomegaly, no splenomegaly MS: nontender back, no kyphosis, no scoliosis, no joint deformities EXT:  2+ DP/PT pulses, no edema, no varicosities, no cyanosis, no clubbing SKIN: warm, nondiaphoretic, normal turgor, no ulcers NEUROPSYCH: alert, oriented to person, place, and time, sensory/motor grossly intact, normal mood, appropriate affect     Recent Labs: 04/27/2016: BUN 24; Creatinine, Ser 0.78; Hemoglobin 14.6; Platelets 259; Potassium 4.2; Sodium 138   Lipid Panel No results found for: CHOL, TRIG, HDL, CHOLHDL, VLDL, LDLCALC, LDLDIRECT  03/29/2016: TC 177 TG 116 LDL 94 HDL 60.3  Other studies Reviewed:  EKG:  The ekg from 10/14/2015 was personally reviewed by me and it revealed atrial paced   Additional studies/ records that were reviewed personally reviewed by me today include: Left heart cath 11/03/2014:  LM lesion, 30% stenosed.  Dist Cx lesion, 60% stenosed.  Prox LAD to Mid LAD lesion, 20% stenosed.  Ost Cx to Prox Cx lesion, 50% stenosed.  Prox Cx  lesion, 10% stenosed. The lesion was previously treated  with a stent (unknown type) greater than two years ago.  2nd Mrg lesion, 50% stenosed.  Ost RCA lesion, 30% stenosed.  Mid RCA lesion, 40% stenosed.  Dist RCA lesion, 60% stenosed.  A drug-eluting stent was placed.  Post Atrio-2 lesion, 40% stenosed. The lesion was previously treated with angioplasty .  Post Atrio-1 lesion, 99% stenosed.  There is mild left ventricular systolic dysfunction.   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 There is TIMI 2 flow in that area. This is the culprit for non-ST elevation myocardial infarction post PCI.  2. Mildly reduced LV systolic function with basal inferior wall hypokinesis and ejection fraction of 45-50%. Normal left ventricular end-diastolic pressure.  Echo 11/04/2015: Left ventricle: The cavity size was normal. Systolic function was   normal. The estimated ejection fraction was in the range of 55%   to 60%. Regional wall motion abnormalities cannot be excluded.   Doppler parameters are consistent with abnormal left ventricular   relaxation (grade 1 diastolic dysfunction). - Left atrium: The atrium was normal in size. - Right ventricle: Systolic function was normal. - Pulmonary arteries: Systolic pressure was within the normal   range.  Impressions:  - PVCs in a bigeminal pattern. Challenging image quality.  Nuclear stress test 12/14/2015:  Abnormal, intermediate risk study.  No significant ischemia.  There is a moderate in size, moderate in severity basal and mid inferior fixed defect, which may reflect scar and/or artifact from diaphragmatic attenuation and adjacent gut activity.  There is a small in size, severe fixed defect involving the apex that most likely represents apical thinning and/or attenuation artifact. Element of scar cannot be excluded.  The left  ventricular ejection fraction is moderately decreased (41%).  Sensitivity and specificity of the study is degraded by extracardiac activity, motion, and attentuation.  Carotid u/s 04/13/2016: Heterogeneous plaque, bilaterally. Chronically occluded RICA. 39-03% LICA stenosis, with stable velocities. >50% RECA stenosis. Patent vertebral arteries with antegrade flow. Normal subclavian arteries, bilaterally. 1 year f/u  Nuclear stress test 05/16/2016:  Low risk study without ischemia.  There is a small in size, moderate in severity, fixed basal and mid inferior defect, which may represent artifact (diaphragmatic attenuation and extracardiac activity) or scar.  There is a small in size, moderate in severity, fixed defect at the apex most likely representing apical thinning.  The left ventricular ejection fraction is normal (57%).  Compared with prior study from 12/14/15, there has been no significant change in myocardial perfusion. LVEF is higher on today's study.  ASSESSMENT AND PLAN: CAD status post multiple PCI NSTEMI 10/2014 No recurrence of CP.  nuclear stress test - no evidence of ischemia 05/16/2016 Continue medical therapy: Aspirin 81 daily, atorvastatin 80 daily at bedtime, Imdur 120 daily, metoprolol. Patient finished we'll antiplatelet therapy October 2017. Monitor for symptoms.   Hyperlipidemia LDL goal is less than 70 due to CAD RD on atorvastatin 80 daily at bedtime. Patient knows to repeat CMP and lipid panel in 3 months and then follow-up in the office.  History of hypertension with orthostatic changes No changes in recommendations from last visit for 05/09/2016: He has been dealing with this for a long time . Knows not to take Flomax or similar drugs.  He has been stable on current meds for now.   history of atrial flutter for which he underwent ablation 10/16   No changes in recommendations from last visit  05/09/2016: Sinus node dysfunction status post  pacemaker Patient follows up with Dr. Caryl Comes  Carotid artery disease Chronically occluded RICA. 44-51% LICA stenosis, with stable velocities. >50% RECA stenosis. Patent vertebral arteries with antegrade flow. Normal subclavian arteries, bilaterally. No changes in recommendation from 05/09/2016: 1 year f/u Cont asa, statin therapy, LDL goal < 70  Claudication Rec ABI/LEA. Has not complained of recent claudication. Pt will consider this for next time, not right now.   Current medicines are reviewed at length with the patient today.  The patient does not have concerns regarding medicines.  Labs/ tests ordered today include:  No orders of the defined types were placed in this encounter.  I had a lengthy and detailed discussion with the patient regarding diagnoses, prognosis, diagnostic options, treatment options , and side effects of medications.   I counseled the patient on importance of lifestyle modification including heart healthy diet, regular physical activity.   Disposition:   FU with  cardiology in 3-4 months  Signed, Wende Bushy, MD  05/25/2016 9:01 AM    [place]  This note was generated in part with voice recognition software and I apologize for any typographical errors that were not detected and corrected.

## 2016-05-25 NOTE — Patient Instructions (Signed)
Follow-Up: Your physician wants you to follow-up in: 3 months after labs are done. You will receive a reminder letter in the mail two months in advance. If you don't receive a letter, please call our office to schedule the follow-up appointment.  It was a pleasure seeing you today here in the office. Please do not hesitate to give [place] a call back if you have any further questions. [place], BSN

## 2016-05-25 NOTE — Telephone Encounter (Signed)
Rescheduled to 06/19/16. Hiraa at [facility] office made aware- she will notify patient.

## 2016-05-25 NOTE — Telephone Encounter (Signed)
Patient wants to know if he can do remote check early as [place] will be [place] for a [place] and does not want to take machine with him.  Patient is leaving on 5/22 and would like to do before this day.  Please call to discuss.     Patient currently in [place] office for 830 ov with ingal.

## 2016-05-26 LAB — SURGICAL PATHOLOGY

## 2016-06-09 ENCOUNTER — Other Ambulatory Visit: Payer: Self-pay | Admitting: Internal Medicine

## 2016-06-16 ENCOUNTER — Other Ambulatory Visit: Payer: Self-pay | Admitting: Internal Medicine

## 2016-06-16 DIAGNOSIS — J441 Chronic obstructive pulmonary disease with (acute) exacerbation: Secondary | ICD-10-CM

## 2016-06-19 ENCOUNTER — Ambulatory Visit (INDEPENDENT_AMBULATORY_CARE_PROVIDER_SITE_OTHER): Payer: Medicare Other | Admitting: *Deleted

## 2016-06-19 DIAGNOSIS — I495 Sick sinus syndrome: Secondary | ICD-10-CM | POA: Diagnosis not present

## 2016-06-19 NOTE — Progress Notes (Signed)
Remote pacemaker transmission.   

## 2016-06-20 LAB — CUP PACEART REMOTE DEVICE CHECK
Battery Impedance: 2091 Ohm
Battery Remaining Longevity: 13 mo
Battery Voltage: 2.71 V
Brady Statistic AS VS Percent: 0 %
Implantable Lead Implant Date: 20111219
Implantable Lead Location: 753859
Implantable Lead Model: 5092
Implantable Lead Model: 5594
Lead Channel Impedance Value: 472 Ohm
Lead Channel Setting Pacing Amplitude: 3.5 V
Lead Channel Setting Sensing Sensitivity: 2 mV
MDC IDC LEAD IMPLANT DT: 20111219
MDC IDC LEAD LOCATION: 753860
MDC IDC MSMT LEADCHNL RV IMPEDANCE VALUE: 534 Ohm
MDC IDC MSMT LEADCHNL RV PACING THRESHOLD AMPLITUDE: 0.625 V
MDC IDC MSMT LEADCHNL RV PACING THRESHOLD PULSEWIDTH: 0.4 ms
MDC IDC PG IMPLANT DT: 20111219
MDC IDC SESS DTM: 20180521150021
MDC IDC SET LEADCHNL RV PACING AMPLITUDE: 2 V
MDC IDC SET LEADCHNL RV PACING PULSEWIDTH: 0.4 ms
MDC IDC STAT BRADY AP VP PERCENT: 9 %
MDC IDC STAT BRADY AP VS PERCENT: 91 %
MDC IDC STAT BRADY AS VP PERCENT: 0 %

## 2016-06-21 ENCOUNTER — Encounter: Payer: Self-pay | Admitting: Cardiology

## 2016-07-11 ENCOUNTER — Ambulatory Visit (INDEPENDENT_AMBULATORY_CARE_PROVIDER_SITE_OTHER): Payer: Medicare Other | Admitting: Internal Medicine

## 2016-07-11 ENCOUNTER — Encounter: Payer: Self-pay | Admitting: Internal Medicine

## 2016-07-11 ENCOUNTER — Telehealth: Payer: Self-pay | Admitting: Internal Medicine

## 2016-07-11 DIAGNOSIS — J441 Chronic obstructive pulmonary disease with (acute) exacerbation: Secondary | ICD-10-CM

## 2016-07-11 MED ORDER — AZITHROMYCIN 250 MG PO TABS: 250.0000 mg | ORAL_TABLET | Freq: Every day | ORAL | 3 refills | Status: DC

## 2016-07-11 MED ORDER — TIOTROPIUM BROMIDE MONOHYDRATE 1.25 MCG/ACT IN AERS: 2.0000 | INHALATION_SPRAY | Freq: Two times a day (BID) | RESPIRATORY_TRACT | 3 refills | Status: DC

## 2016-07-11 MED ORDER — ALBUTEROL SULFATE HFA 108 (90 BASE) MCG/ACT IN AERS: 2.0000 | INHALATION_SPRAY | RESPIRATORY_TRACT | 3 refills | Status: DC | PRN

## 2016-07-11 MED ORDER — PREDNISONE 10 MG PO TABS: 50.0000 mg | ORAL_TABLET | Freq: Every day | ORAL | 0 refills | Status: DC

## 2016-07-11 MED ORDER — BUDESONIDE 0.5 MG/2ML IN SUSP: 0.5000 mg | Freq: Two times a day (BID) | RESPIRATORY_TRACT | 3 refills | Status: AC

## 2016-07-11 MED ORDER — ARFORMOTEROL TARTRATE 15 MCG/2ML IN NEBU: 15.0000 ug | INHALATION_SOLUTION | Freq: Two times a day (BID) | RESPIRATORY_TRACT | 3 refills | Status: AC

## 2016-07-11 NOTE — Telephone Encounter (Signed)
Rx has been faxed to [facility] pharmacy. Nothing further needed.

## 2016-07-11 NOTE — Patient Instructions (Signed)
Continue neb treatments as prescribed Prednisone 50 mg daily for 10 days Stop Incruse and changed to Spiriva respimat 1.25

## 2016-07-11 NOTE — Telephone Encounter (Signed)
Pharmacist called states rx- Brovana, and pulmicort,  that were faxed this morning were supposed to to to his mail order pharmacy.

## 2016-07-11 NOTE — Progress Notes (Signed)
Subjective:    Patient ID: Donald Juarez, male    DOB: January 19, 1946, 71 y.o.   MRN: [id number]  Synopsis: Mr. Ontiveros first saw the [facility] clinic in 2014 for COPD after smoking 1 PPD for 30 years and quitting in 2010.  He has frequent exacerbations.    03/2013 Full PFT> Ratio 53% FEV1 1.89 (65% pred, 13% change), TLC 5.83 L (93% pred), DLCO 16.6 (69% pred) Quit tobacco 2010 Recent Cardiac Stent 11/16  CC: chronic SOB, follow up COPD +cough and chest congestion  HPI  Patient having acute mild COPD exacerbation Still wheezing, SOb and DOE Has been on several rounds of steroids Patient was started on azithromycin to prevent COPD exacerbations  Patient feels bad, increased cough and feeling bad Has intermittent coughing spells-+wheezing   Inhaler regimen as follows 7AM-8AM albuterol neb/Brovana/takes incruse Albuterol Neb throughout the day as needed Refuses to obtain sleep study  Will change to spiriva respimat and and assess resp status   Review of Systems  Constitutional: Positive for fatigue. Negative for chills and fever.  HENT: Positive for congestion, postnasal drip and rhinorrhea.   Respiratory: Positive for cough, shortness of breath and wheezing. Negative for chest tightness.   Cardiovascular: Negative for chest pain, palpitations and leg swelling.  Genitourinary: Positive for difficulty urinating and frequency.  Neurological: Negative for dizziness.  All other systems reviewed and are negative.      Objective:   Physical Exam  Constitutional: He is oriented to person, place, and time. He appears well-developed and well-nourished. No distress.  HENT:  Mouth/Throat: No oropharyngeal exudate.  Eyes: EOM are normal.  Neck: Neck supple.  Cardiovascular: Normal rate, regular rhythm and normal heart sounds.   No murmur heard. Pulmonary/Chest: No stridor. No respiratory distress. He has wheezes. He has rales.  Musculoskeletal: Normal range of  motion. He exhibits no edema.  Neurological: He is alert and oriented to person, place, and time.  Skin: Skin is warm. He is not diaphoretic.  Psychiatric: He has a normal mood and affect.   BP 132/80 (BP Location: Right Arm, Cuff Size: Normal)   Pulse 73   Resp 16   Ht 5\' 10"  (1.778 m)   Wt 215 lb (97.5 kg)   SpO2 95%   BMI 30.85 kg/m    03/12/2014 left heart catheterization reviewed> moderately elevated LVEDP, patent stents, occlusion noted in a bifurcation in the RCA territory, LVEF 65% 05/10/2014 chest x-ray images personally reviewed there are no pulmonary infiltrates but there is emphysema bilaterally pacemaker in place    Assessment & Plan:  71 yo white male with moderate  COPD with chronic SOB COPD, GOLD GRADE C with acute COPD exacerbation in setting of deconditioned state. Patient's regimen seems to be at maximum level of therapy. Patient has chronic UTi and currently on oral abx with Levaquin   1.Shortness of breath and dyspnea on exertion with wheezing related to COPD -stop INcruse -chage to Respimat 1.25 -albuterol NEB as needed and QHS -continue zithromax 250 mg daily for COPD exac prevention -continue pulmciort/LABA(brovana) as prescribed -robitussin with codeine for cough -.start prednisone 50 mg daily for 10 days  2.Obesity -recommend significant weight loss -recommend changing diet  3.Deconditioned state -Recommend increased daily activity and exercise    Follow up 6 month to assess interval changes  Patient  satisfied with Plan of action and management. All questions answered  Corrin Parker, M.D.  [facility]  Medical Director [place]  Medical Director [place] Department

## 2016-07-17 IMAGING — CR DG CHEST 2V
2 series · 2 of 2 positions shown · non-contrast
Comparison: None.

CLINICAL DATA: Cough, wheezing, shortness of breath for 4 weeks,
former smoking history

EXAM:
CHEST  2 VIEW

[view not recorded (1 of 2)]
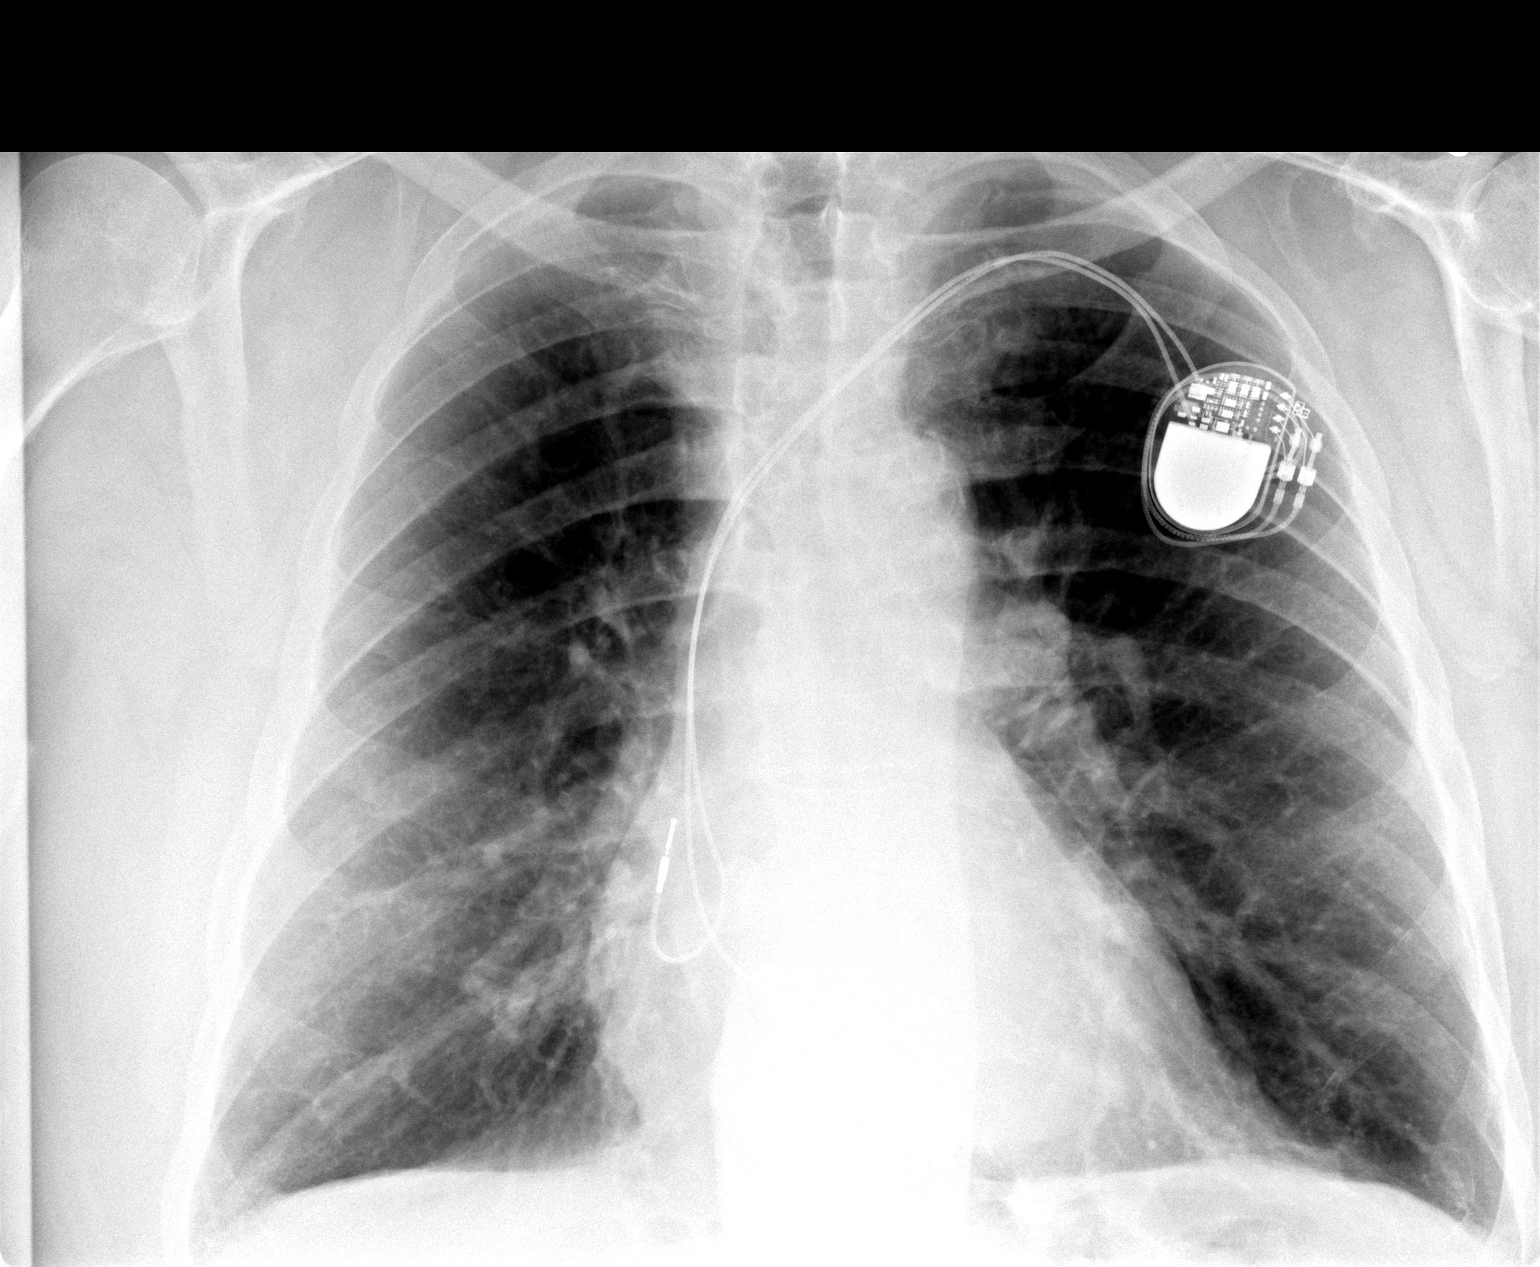

[view not recorded (2 of 2)]
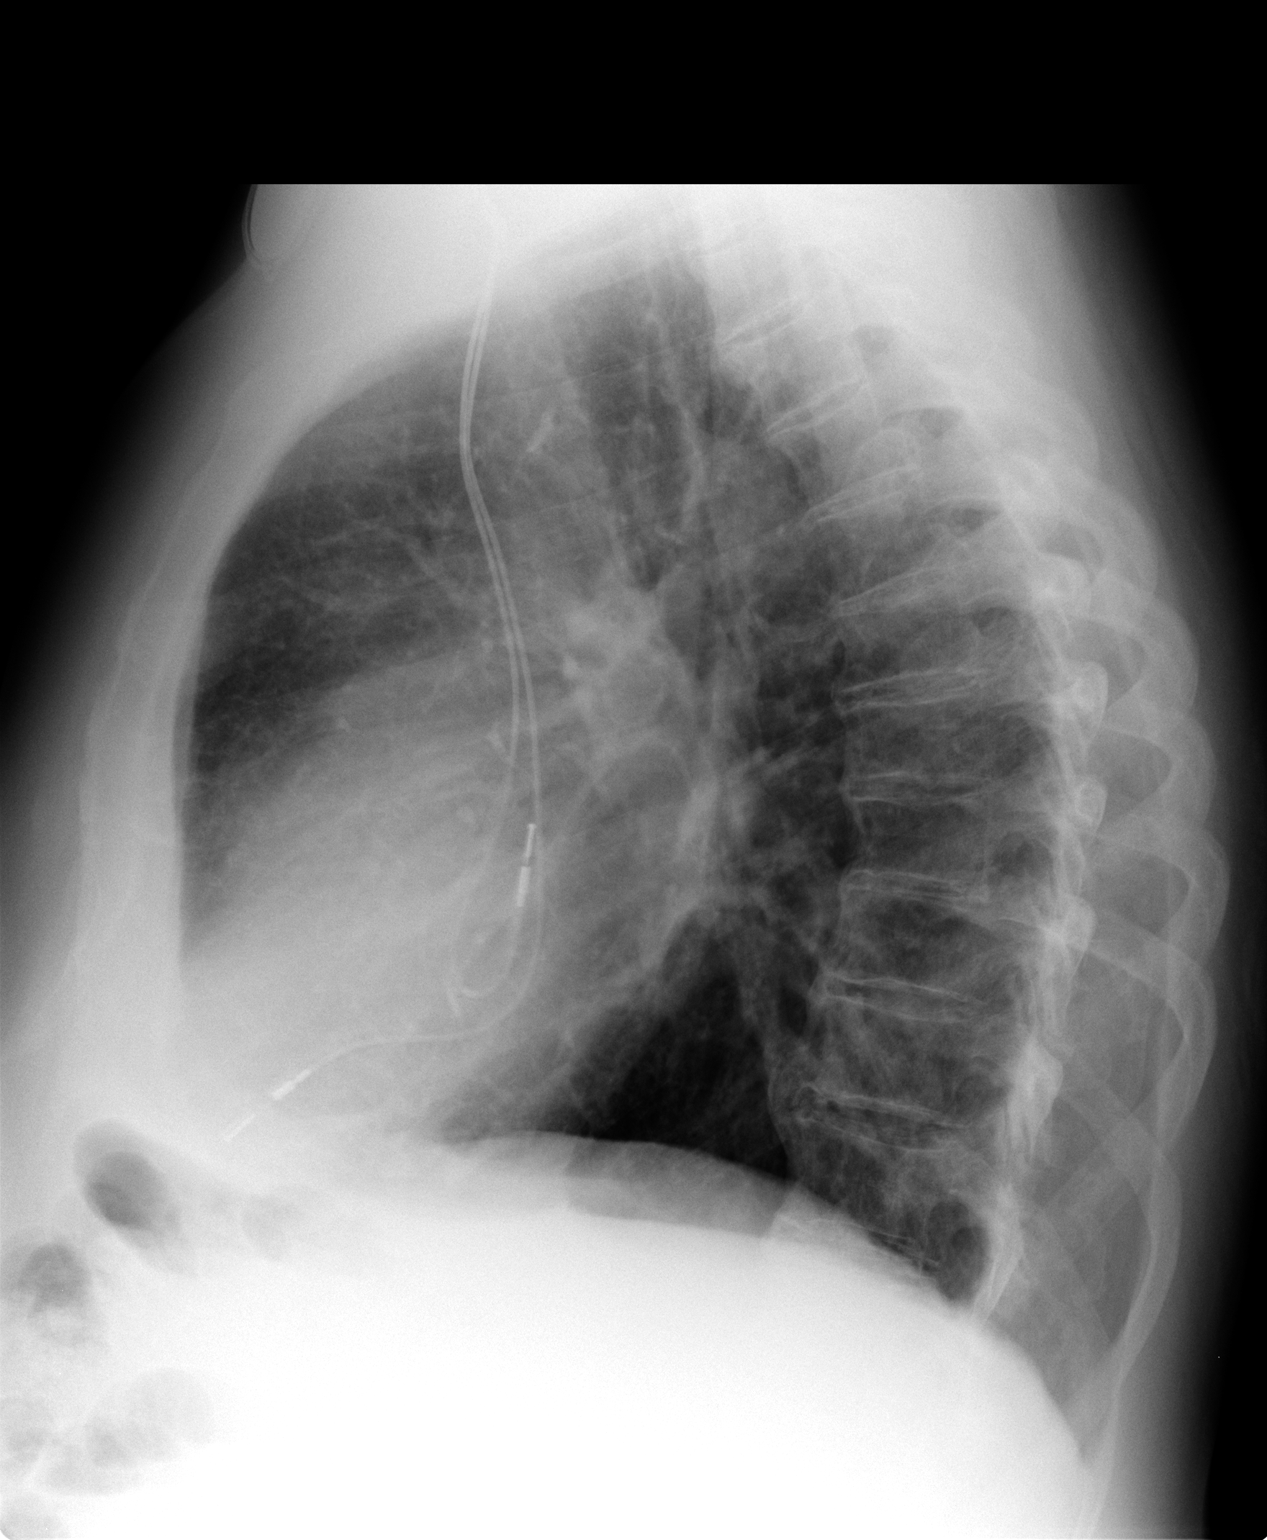

[2 of 2 positions shown; findings below may reference images not displayed]

FINDINGS: The lungs are hyperaerated with increased AP diameter suggesting a
degree of emphysema. No focal infiltrate or effusion is seen.
However, there is a question of a nodule medially at the left
cardiophrenic angle which may be posteriorly positioned on the
lateral view. Comparison with prior or followup chest x-ray is
recommended to assess this area further versus CT of the chest
without IV contrast media. A dual lead permanent pacemaker is noted.
There are degenerative changes throughout the mid to lower thoracic
spine.
IMPRESSION: 1. Questionable nodule medially at the left cardiophrenic angle.
Compare with prior or followup chest x-ray or consider CT of the
chest.
2. Hyper aeration may indicate emphysema.
3. Dual lead permanent pacemaker

## 2016-09-07 ENCOUNTER — Ambulatory Visit
Admission: EM | Admit: 2016-09-07 | Discharge: 2016-09-07 | Disposition: A | Discharge status: Home / Self Care | Payer: Medicare Other | Attending: Family Medicine | Admitting: Family Medicine

## 2016-09-07 DIAGNOSIS — J441 Chronic obstructive pulmonary disease with (acute) exacerbation: Secondary | ICD-10-CM | POA: Diagnosis not present

## 2016-09-07 DIAGNOSIS — J209 Acute bronchitis, unspecified: Secondary | ICD-10-CM | POA: Diagnosis not present

## 2016-09-07 DIAGNOSIS — R300 Dysuria: Secondary | ICD-10-CM | POA: Diagnosis not present

## 2016-09-07 DIAGNOSIS — Z8546 Personal history of malignant neoplasm of prostate: Secondary | ICD-10-CM

## 2016-09-07 LAB — URINALYSIS, COMPLETE (UACMP) WITH MICROSCOPIC
BACTERIA UA: NONE SEEN
Glucose, UA: NEGATIVE mg/dL
LEUKOCYTES UA: NEGATIVE
Nitrite: NEGATIVE
PROTEIN: 100 mg/dL — AB
RBC / HPF: NONE SEEN RBC/hpf (ref 0–5)
pH: 5.5 (ref 5.0–8.0)

## 2016-09-07 MED ORDER — IPRATROPIUM-ALBUTEROL 0.5-2.5 (3) MG/3ML IN SOLN
3.0000 mL | Freq: Once | RESPIRATORY_TRACT | Status: AC
  Administered 2016-09-07: 3 mL via RESPIRATORY_TRACT

## 2016-09-07 MED ORDER — LEVOFLOXACIN 750 MG PO TABS: 750.0000 mg | ORAL_TABLET | Freq: Every day | ORAL | 0 refills | Status: DC

## 2016-09-07 MED ORDER — PREDNISONE 10 MG (21) PO TBPK: ORAL_TABLET | ORAL | 0 refills | Status: DC

## 2016-09-07 NOTE — ED Provider Notes (Signed)
[facility]    CSN: [id number] Arrival date & time: 09/07/16  0919     History   Chief Complaint Chief Complaint  Patient presents with  . COPD  . Urinary Tract Infection    HPI Donald Juarez is a 71 y.o. male.   71 year old white male with multiple medical problems. First on exam table breathing chest tightness he has a history of COPD. He states that in May he has exacerbation of his COPD as he was going to [facility]. He reports that the trip to [facility] in his COPD exacerbation got better after his doctor here put him on an antibiotic that was 250 mg. Multiple times I explained to him it probably was Zithromax because they had taken him off of it because of his heart medicine. I have tried to explain to him that the Zithromax causes or could cause potentially QT prolongation and that many of our patients on heart medications can no longer take Zithromax. He states that he was put on prednisone for 7 days and that helped but did not completely eradicate the problem. He started having more progressively COPD symptoms and over the last 5-8 days it has gotten worse. He's been using his inhaler multiple times. He states the cough is clear and only slightly productive but he is wheezing a lot he and his pulse ox is usually about 96.  Also states that he has been doing self catheterization for several years because of his prostate cancer history that had been under control but he has had multiple UTIs when he started catheterizing himself. States that the last 5 months he has not had much problem with UTIs but over the last week he's had back pain which is consistent when his prostate axilla and lower abdominal pain as well thinks he has another prostate infection.   He stopped smoking about 10 years ago but his COPD has progressed. He's had coronary artery disease and catheterization and is on special medication for that. He's had knee replacements colonoscopy cardiac  ablation as well as catheterization and stents applied. No known drug allergies. His history of hypertension hyperlipidemia and heart disease in the family   The history is provided by the patient. No language interpreter was used.  Urinary Tract Infection  This is a new problem. The current episode started 2 days ago. The problem has been gradually worsening. Associated symptoms include abdominal pain and shortness of breath. Pertinent negatives include no headaches. Nothing aggravates the symptoms. Nothing relieves the symptoms. He has tried nothing for the symptoms. The treatment provided no relief.  Cough  Cough characteristics:  Productive and hoarse Sputum characteristics:  Clear Severity:  Mild Progression:  Worsening Chronicity:  New Smoker: former smoker.   Worsened by:  Deep breathing Ineffective treatments:  Beta-agonist inhaler Associated symptoms: shortness of breath and wheezing   Associated symptoms: no headaches     Past Medical History:  Diagnosis Date  . Anginal pain ([place])    feels this as jaw pain.  takes imdur for this. rarely uses nitro.  [name] Anxiety   . Arthritis   . Asthma   . Bilateral claudication of lower limb ([place])   . Carotid stenosis    a. 07/2013 Carotid U/S: 100% RICA (pt prev reported h/o 40%), LICA 98-11%, bilat ECA 50%, normal vertebrals and subclavians bilat-->F/U needed in 01/2014.  . Cataract of left eye   . Cataract, right    a. 10/2010 s/p cataract surgery   .  COPD (chronic obstructive pulmonary disease) ([place])   . Coronary artery disease    a. 06/1996 & 03/2005 Cath: non-obs dzs;  b. 07/2009 NSTEMI/Cath: LCX 99->PCI/DES extending into OM1;  c. 01/2013 Cath: patent LCX stent-->Med Rx.; d. cath 03/2014: RPAV dzs->Med rx; e. 10/2014 Cath/PCI: LM 30, LAD 20p/m, LCX 50ost/p, 10 ISR, 60d, OM1 min irregs, OM2 50, RCA 30ost, 17m, 60d, RPDA 70(2.5x8 Xience DES), RPAV 90small(PTCA); f. 10/2014 Relook Cath: RPAV 99(Med Rx), EF 45-50.  [name] Dysrhythmia    since  ablation, flutter resolved.  . Essential hypertension   . Hyperkalemia   . Hyperlipidemia   . Myocardial infarction ([place]) 2015   had stent inserted and then had heart attack after. stent inserted due to blockage  . Paroxysmal atrial flutter ([place])    a. CHA2DS2VASc = 3 -> eliquis;  b. 07/2013 Echo: EF 50-55%, mildly dil LA.c. s/p RFCA 10/31/2013 by Dr Caryl Comes  . Prostate cancer ([place])   . Pulmonary nodule, right    a. 0.7cm RLL - stable by CT 06/2013.  [name] Rectal cancer ([place])    a. Status post colostomy  . Seizures ([place])    a. last 30 years ago  . Shingles    a. 09/2012.  . SSS (sick sinus syndrome) ([place])    a. 12/2009 S/P MDT Adapta ADDR01 DC PPM, ser # [id number] ([name]).  . Syncope and collapse     Patient Active Problem List   Diagnosis Date Noted  . Primary localized osteoarthritis of right knee 01/14/2016  . Essential hypertension   . Hyperlipidemia   . SSS (sick sinus syndrome) ([place])   . Unstable angina ([place]) 11/03/2014  . NSTEMI (non-ST elevated myocardial infarction) ([place])   . Effort angina ([place]) 11/02/2014  . S/P PTCA (percutaneous transluminal coronary angioplasty) 11/02/2014  . Coronary artery disease involving native coronary artery   . Prostate cancer ([place])   . Anxiety and depression 06/18/2014  . Fatigue 11/14/2013  . Typical atrial flutter ([place]) 10/31/2013  . Bilateral claudication of lower limb ([place])   . Coronary artery disease   . Paroxysmal atrial flutter ([place])   . Hypertension   . Carotid stenosis   . COPD, severe GOLD GRADE D 03/28/2013  . Solitary pulmonary nodule 03/28/2013    Past Surgical History:  Procedure Laterality Date  . ABLATION  10/31/2013   CTI ablation by Dr Caryl Comes for atrial flutter  . ATRIAL FLUTTER ABLATION N/A 10/31/2013   Procedure: ATRIAL FLUTTER ABLATION;  Surgeon: [name], MD;  [facility] CATH LAB;  Service: Cardiovascular;  Laterality: N/A;  . CARDIAC CATHETERIZATION  07/2009   [facility]  . CARDIAC CATHETERIZATION  01/2013    [facility]  . CARDIAC CATHETERIZATION  03/2014   [facility]  . CARDIAC CATHETERIZATION N/A 11/02/2014   Procedure: Left Heart Cath and Cors/Grafts Angiography;  Surgeon: [name], MD;  Location: [place] CV LAB;  Service: Cardiovascular;  Laterality: N/A;  . CARDIAC CATHETERIZATION N/A 11/02/2014   Procedure: Coronary Stent Intervention;  Surgeon: [name], MD;  Location: [place] CV LAB;  Service: Cardiovascular;  Laterality: N/A;  . CARDIAC CATHETERIZATION N/A 11/03/2014   Procedure: Left Heart Cath and Coronary Angiography;  Surgeon: [name], MD;  Location: [place] CV LAB;  Service: Cardiovascular;  Laterality: N/A;  . CARDIAC SURGERY  2011   Stents placed   . CATARACT EXTRACTION Left   . CATARACT EXTRACTION W/PHACO Left 06/01/2014   Procedure: CATARACT EXTRACTION PHACO AND INTRAOCULAR LENS PLACEMENT (IOC);  Surgeon: [name], MD;  Location: [name] ORS;  Service: Ophthalmology;  Laterality: Left;  . cataract surgery  2012  . colon surgery     cancer removal  . COLON SURGERY     colostomy  . COLONOSCOPY WITH PROPOFOL N/A 05/24/2016   Procedure: COLONOSCOPY WITH PROPOFOL;  Surgeon: [name], MD;  Location: [facility] ENDOSCOPY;  Service: Endoscopy;  Laterality: N/A;  . CORONARY ANGIOPLASTY    . INSERT / REPLACE / REMOVE PACEMAKER  2011   just a pacer  . KNEE ARTHROSCOPY Right 1980's  . PACEMAKER PLACEMENT  01/17/2010  . TOTAL KNEE ARTHROPLASTY Right 01/14/2016   Procedure: TOTAL KNEE ARTHROPLASTY;  Surgeon: [name], MD;  Location: [name] ORS;  Service: Orthopedics;  Laterality: Right;       Home Medications    Prior to Admission medications   Medication Sig Start Date End Date Taking? Authorizing Provider  aspirin EC 81 MG tablet Take 81 mg by mouth daily.   Yes [provider]  isosorbide mononitrate (IMDUR) 120 MG 24 hr tablet Take one tablet (120 mg) by mouth at bedtime 03/28/16  Yes [name], MD  losartan (COZAAR) 25 MG  tablet Take 25 mg by mouth daily.   Yes [provider]  metoprolol tartrate (LOPRESSOR) 25 MG tablet Take one tablet (25 mg) in the morning and two tablets (50 mg) in the evening Patient taking differently: Take 25 mg by mouth 3 (three) times daily.  08/10/15  Yes [name], MD  ranolazine (RANEXA) 500 MG 12 hr tablet Take 500 mg by mouth 2 (two) times daily.   Yes [provider]  albuterol (PROAIR HFA) 108 (90 Base) MCG/ACT inhaler Inhale 2 puffs into the lungs every 4 (four) hours as needed for wheezing or shortness of breath. 07/11/16   Flora Lipps, MD  albuterol (PROVENTIL) (2.5 MG/3ML) 0.083% nebulizer solution USE 1 VIAL VIA BY NEBULIZER EVERY 6 HOURS AS NEEDED FOR WHEEZING OR SHORTNESS OF BREATH 04/24/16   Flora Lipps, MD  amLODipine (NORVASC) 2.5 MG tablet Take 2.5 mg by mouth daily. 11/27/15   [provider]  arformoterol (BROVANA) 15 MCG/2ML NEBU Take 2 mLs (15 mcg total) by nebulization 2 (two) times daily. Take 2 mLs (15 mcg total) by nebulization 2 (two) times daily. DX code 493.20 Every morning and 1800 07/11/16   Flora Lipps, MD  atorvastatin (LIPITOR) 80 MG tablet Take 1 tablet (80 mg total) by mouth daily. 05/09/16 08/07/16  Wende Bushy, MD  azithromycin (ZITHROMAX) 250 MG tablet Take 1 tablet (250 mg total) by mouth daily. 07/11/16   Flora Lipps, MD  budesonide (PULMICORT) 0.5 MG/2ML nebulizer solution Take 2 mLs (0.5 mg total) by nebulization 2 (two) times daily. DX code 493.20 Every morning and 1800 07/11/16   Flora Lipps, MD  Cholecalciferol (VITAMIN D) 2000 UNITS tablet Take 2,000 Units by mouth daily.    [provider]  degarelix (FIRMAGON) 120 MG injection Inject into the skin every 3 (three) months.     [provider]  levofloxacin (LEVAQUIN) 750 MG tablet Take 1 tablet (750 mg total) by mouth daily. 09/07/16   Frederich Cha, MD  nitroGLYCERIN (NITROSTAT) 0.4 MG SL tablet Place 1 tablet (0.4 mg total) under the tongue every 5  (five) minutes as needed for chest pain. 05/09/16   Wende Bushy, MD  nystatin cream (MYCOSTATIN) Apply 1 application topically 2 (two) times daily as needed. Rash/skin irritation. 09/17/15   [provider]  PARoxetine (PAXIL)  10 MG tablet Take 10 mg by mouth daily. 12/06/15   [provider]  predniSONE (DELTASONE) 10 MG tablet Take 5 tablets (50 mg total) by mouth daily with breakfast. X 10 days 07/11/16   Flora Lipps, MD  predniSONE (STERAPRED UNI-PAK 21 TAB) 10 MG (21) TBPK tablet Sig 6 tablet day 1, 5 tablets day 2, 4 tablets day 3,,3tablets day 4, 2 tablets day 5, 1 tablet day 6 take all tablets orally 09/07/16   Frederich Cha, MD  Tiotropium Bromide Monohydrate (SPIRIVA RESPIMAT) 1.25 MCG/ACT AERS Inhale 2 puffs into the lungs 2 (two) times daily. 07/11/16   Flora Lipps, MD  traMADol (ULTRAM) 50 MG tablet Take 1-2 tablets (50-100 mg total) by mouth every 4 (four) hours as needed for moderate pain. 01/16/16   Duanne Guess, PA-C    Family History Family History  Problem Relation Age of Onset  . Cancer Mother        'male cancer"  . Hypertension Mother   . Hyperlipidemia Mother   . Heart disease Father   . Heart attack Father   . Hypertension Father   . Hyperlipidemia Father     Social History Social History  Substance Use Topics  . Smoking status: Former Smoker    Packs/day: 1.00    Years: 30.00    Types: Cigarettes    Quit date: 07/26/2008  . Smokeless tobacco: Never Used  . Alcohol use No     Allergies   Patient has no known allergies.   Review of Systems Review of Systems  Respiratory: Positive for cough, chest tightness, shortness of breath and wheezing.   Gastrointestinal: Positive for abdominal pain.  Genitourinary: Positive for dysuria and frequency.  Musculoskeletal: Positive for back pain.  Neurological: Negative for headaches.  All other systems reviewed and are negative.    Physical Exam Triage Vital Signs ED Triage Vitals  Enc  Vitals Group     BP 09/07/16 1001 (!) 140/104     Pulse Rate 09/07/16 1001 66     Resp 09/07/16 1001 (!) 22     Temp 09/07/16 1001 97.8 F (36.6 C)     Temp Source 09/07/16 1001 Oral     SpO2 09/07/16 1001 94 %     Weight 09/07/16 1002 210 lb (95.3 kg)     Height 09/07/16 1002 5\' 10"  (1.778 m)     Head Circumference --      Peak Flow --      Pain Score 09/07/16 1002 6     Pain Loc --      Pain Edu? --      Excl. in [place]? --    No data found.   Updated Vital Signs BP (!) 140/104 (BP Location: Left Arm)   Pulse 66   Temp 97.8 F (36.6 C) (Oral)   Resp (!) 22   Ht 5\' 10"  (1.778 m)   Wt 210 lb (95.3 kg)   SpO2 94%   BMI 30.13 kg/m   Visual Acuity Right Eye Distance:   Left Eye Distance:   Bilateral Distance:    Right Eye Near:   Left Eye Near:    Bilateral Near:     Physical Exam  Constitutional: He is oriented to person, place, and time. He appears well-developed and well-nourished.  HENT:  Head: Normocephalic and atraumatic.  Right Ear: External ear normal.  Left Ear: External ear normal.  Mouth/Throat: Oropharynx is clear and moist.  Eyes: Pupils are equal, round, and reactive  to light.  Neck: Normal range of motion. Neck supple.  Cardiovascular: Normal rate, regular rhythm and normal heart sounds.   Pulmonary/Chest: He has decreased breath sounds. He has wheezes.  Abdominal: Soft.  Musculoskeletal: Normal range of motion.  Neurological: He is alert and oriented to person, place, and time.  Skin: Skin is warm.  Psychiatric: He has a normal mood and affect.  Vitals reviewed.    UC Treatments / Results  Labs (all labs ordered are listed, but only abnormal results are displayed) Labs Reviewed  URINALYSIS, COMPLETE (UACMP) WITH MICROSCOPIC - Abnormal; Notable for the following:       Result Value   Specific Gravity, Urine >1.030 (*)    Hgb urine dipstick TRACE (*)    Bilirubin Urine SMALL (*)    Ketones, ur TRACE (*)    Protein, ur 100 (*)    Squamous  Epithelial / LPF 0-5 (*)    All other components within normal limits  URINE CULTURE    EKG  EKG Interpretation None       Radiology No results found.  Procedures Procedures (including critical care time)  Medications Ordered in UC Medications  ipratropium-albuterol (DUONEB) 0.5-2.5 (3) MG/3ML nebulizer solution 3 mL (3 mLs Nebulization Given 09/07/16 1119)      Results for orders placed or performed during the hospital encounter of 09/07/16  Urinalysis, Complete w Microscopic  Result Value Ref Range   Color, Urine YELLOW YELLOW   APPearance CLEAR CLEAR   Specific Gravity, Urine >1.030 (H) 1.005 - 1.030   pH 5.5 5.0 - 8.0   Glucose, UA NEGATIVE NEGATIVE mg/dL   Hgb urine dipstick TRACE (A) NEGATIVE   Bilirubin Urine SMALL (A) NEGATIVE   Ketones, ur TRACE (A) NEGATIVE mg/dL   Protein, ur 100 (A) NEGATIVE mg/dL   Nitrite NEGATIVE NEGATIVE   Leukocytes, UA NEGATIVE NEGATIVE   Squamous Epithelial / LPF 0-5 (A) NONE SEEN   WBC, UA 0-5 0 - 5 WBC/hpf   RBC / HPF NONE SEEN 0 - 5 RBC/hpf   Bacteria, UA NONE SEEN NONE SEEN   Mucous PRESENT    Initial Impression / Assessment and Plan / UC Course  I have reviewed the triage vital signs and the nursing notes.  Pertinent labs & imaging results that were available during my care of the patient were reviewed by me and considered in my medical decision making (see chart for details).     We'll give him a DuoNeb treatment here we'll place 16 a course of prednisone because he is having trouble with his prostate and his urine even though his urine looked okay today will place him on Levaquin 750 one tablet a day for 7 days follow-up with his PCP of choice in 2-3 weeks  Final Clinical Impressions(s) / UC Diagnoses   Final diagnoses:  History of prostate cancer  Dysuria  COPD exacerbation ([place])  Acute bronchitis with bronchospasm    New Prescriptions Discharge Medication List as of 09/07/2016 11:28 AM    START taking these  medications   Details  levofloxacin (LEVAQUIN) 750 MG tablet Take 1 tablet (750 mg total) by mouth daily., Starting Thu 09/07/2016, Normal    predniSONE (STERAPRED UNI-PAK 21 TAB) 10 MG (21) TBPK tablet Sig 6 tablet day 1, 5 tablets day 2, 4 tablets day 3,,3tablets day 4, 2 tablets day 5, 1 tablet day 6 take all tablets orally, Normal         Controlled Substance Prescriptions [place] Controlled Substance Registry  consulted? Not Applicable   Frederich Cha, MD 09/07/16 1143

## 2016-09-07 NOTE — ED Triage Notes (Addendum)
Pt c/o shortness of breath, audile crackling with exhaling, he says his body hurts all over. He also caths himself and he feels he may have an UTI.

## 2016-09-10 ENCOUNTER — Telehealth (HOSPITAL_COMMUNITY): Payer: Self-pay | Admitting: Internal Medicine

## 2016-09-10 LAB — URINE CULTURE: Culture: 40000 — AB

## 2016-09-10 NOTE — Telephone Encounter (Signed)
Clinical staff, please let patient know that urine culture was positive for an Enterococcus germ, resistant to levaquin.  Patient was prescribed levaquin mostly to cover COPD exacerbation with possible UTI.  At this point, would finish levaquin.  If having persistent low back/low abdominal pain, would also send rx for amoxicillin 875mg  bid x 7d #14 no refills.  Recheck or followup with PCP for further evaluation if symptoms are not improving.  LM

## 2016-09-18 ENCOUNTER — Encounter: Payer: Medicare Other | Admitting: *Deleted

## 2016-09-18 ENCOUNTER — Other Ambulatory Visit: Payer: Self-pay | Admitting: *Deleted

## 2016-09-18 MED ORDER — METOPROLOL TARTRATE 25 MG PO TABS: ORAL_TABLET | ORAL | 3 refills | Status: DC

## 2016-09-21 ENCOUNTER — Encounter: Payer: Self-pay | Admitting: Cardiology

## 2016-10-04 ENCOUNTER — Other Ambulatory Visit: Payer: Self-pay | Admitting: Internal Medicine

## 2016-12-07 ENCOUNTER — Ambulatory Visit (INDEPENDENT_AMBULATORY_CARE_PROVIDER_SITE_OTHER): Payer: Medicare Other | Admitting: *Deleted

## 2016-12-07 DIAGNOSIS — I495 Sick sinus syndrome: Secondary | ICD-10-CM | POA: Diagnosis not present

## 2016-12-08 ENCOUNTER — Encounter: Payer: Self-pay | Admitting: Cardiology

## 2016-12-08 NOTE — Progress Notes (Signed)
Remote pacemaker transmission.   

## 2016-12-13 LAB — CUP PACEART REMOTE DEVICE CHECK
Battery Impedance: 2863 Ohm
Battery Voltage: 2.62 V
Brady Statistic AP VP Percent: 7 %
Brady Statistic AS VP Percent: 2 %
Brady Statistic AS VS Percent: 12 %
Date Time Interrogation Session: 20181108184934
Implantable Lead Location: 753859
Implantable Lead Location: 753860
Lead Channel Pacing Threshold Amplitude: 0.625 V
Lead Channel Setting Pacing Amplitude: 2 V
Lead Channel Setting Pacing Pulse Width: 0.4 ms
MDC IDC LEAD IMPLANT DT: 20111219
MDC IDC LEAD IMPLANT DT: 20111219
MDC IDC MSMT BATTERY REMAINING LONGEVITY: 3 mo
MDC IDC MSMT LEADCHNL RA IMPEDANCE VALUE: 464 Ohm
MDC IDC MSMT LEADCHNL RV IMPEDANCE VALUE: 525 Ohm
MDC IDC MSMT LEADCHNL RV PACING THRESHOLD PULSEWIDTH: 0.4 ms
MDC IDC PG IMPLANT DT: 20111219
MDC IDC SET LEADCHNL RA PACING AMPLITUDE: 5 V
MDC IDC SET LEADCHNL RV SENSING SENSITIVITY: 2 mV
MDC IDC STAT BRADY AP VS PERCENT: 80 %

## 2017-01-01 ENCOUNTER — Telehealth: Payer: Self-pay | Admitting: *Deleted

## 2017-01-01 NOTE — Telephone Encounter (Signed)
Called to make Donald Juarez aware of AF found on November remote and that Dr. Caryl Comes would like to see him in the office. He reports that his wife has been in the hospital for 4 weeks and is now going to rehab so it may be difficult to schedule. I will send a message to scheduling to arrange.

## 2017-01-01 NOTE — Telephone Encounter (Signed)
-----   Message from [name], MD sent at 12/31/2016 12:50 PM EST ----- Device remote reviewed. Remote is normal.  Battery status is good.  Lead measurements unchanged.  Histograms are appropriate. Known Atrial fibrillation/flutter  Mostly Atrial fib events are short, all less than 6 hrs I dont see discussion of anticoagulation in chart  Will need an appt w me to do so  tnx

## 2017-01-04 ENCOUNTER — Encounter: Payer: Medicare Other | Admitting: *Deleted

## 2017-01-04 ENCOUNTER — Telehealth: Payer: Self-pay | Admitting: Cardiology

## 2017-01-04 NOTE — Telephone Encounter (Signed)
Attempted to confirm remote transmission with pt. No answer and was unable to leave a message.   

## 2017-01-05 ENCOUNTER — Encounter: Payer: Self-pay | Admitting: Cardiology

## 2017-01-10 ENCOUNTER — Telehealth: Payer: Self-pay | Admitting: Internal Medicine

## 2017-01-10 NOTE — Telephone Encounter (Signed)
New message   Call patient per in-basket message. Patient stated he did not want to make an appointment right now due to his wife is very sick and he does not know what will happen.    Long, Susette Racer, RN  Lorenda Hatchet A        Hey! SK found AF and would like to see Donald Juarez in office to discuss. Donald Juarez is aware that you will be in touch. He will be a physician pacer check. Thanks!!

## 2017-01-11 NOTE — Telephone Encounter (Signed)
LMOM (per DPR) to call back to r/s when able.

## 2017-01-24 ENCOUNTER — Other Ambulatory Visit: Payer: Self-pay | Admitting: Internal Medicine

## 2017-01-31 ENCOUNTER — Telehealth: Payer: Self-pay | Admitting: Internal Medicine

## 2017-01-31 NOTE — Telephone Encounter (Signed)
New Message    Pt c/o medication issue:  1. Name of Medication: isosorbide mononitrate (IMDUR)  2. How are you currently taking this medication (dosage and times per day)? 120mg  once a day   3. Are you having a reaction (difficulty breathing--STAT)? no  4. What is your medication issue? Brie from [employer] is calling to get clarification as to how and when patient should be taking this medication.

## 2017-01-31 NOTE — Telephone Encounter (Signed)
Spoke with Brie from pharmacy and advised that he sees Dr. Clayborn Bigness for cardiology. Provided her with their number and she was appreciative for the call.

## 2017-02-07 ENCOUNTER — Inpatient Hospital Stay
Admission: EM | Admit: 2017-02-07 | Discharge: 2017-02-09 | DRG: 287 | Disposition: A | Discharge status: Other Acute Inpatient Hospital | Payer: Medicare Other | Attending: Internal Medicine | Admitting: Internal Medicine

## 2017-02-07 ENCOUNTER — Observation Stay: Payer: Medicare Other

## 2017-02-07 ENCOUNTER — Emergency Department: Payer: Medicare Other

## 2017-02-07 DIAGNOSIS — J449 Chronic obstructive pulmonary disease, unspecified: Secondary | ICD-10-CM | POA: Diagnosis present

## 2017-02-07 DIAGNOSIS — Z950 Presence of cardiac pacemaker: Secondary | ICD-10-CM | POA: Diagnosis not present

## 2017-02-07 DIAGNOSIS — I25110 Atherosclerotic heart disease of native coronary artery with unstable angina pectoris: Principal | ICD-10-CM | POA: Diagnosis present

## 2017-02-07 DIAGNOSIS — Z85048 Personal history of other malignant neoplasm of rectum, rectosigmoid junction, and anus: Secondary | ICD-10-CM

## 2017-02-07 DIAGNOSIS — Z8546 Personal history of malignant neoplasm of prostate: Secondary | ICD-10-CM

## 2017-02-07 DIAGNOSIS — I10 Essential (primary) hypertension: Secondary | ICD-10-CM | POA: Diagnosis present

## 2017-02-07 DIAGNOSIS — F419 Anxiety disorder, unspecified: Secondary | ICD-10-CM | POA: Diagnosis present

## 2017-02-07 DIAGNOSIS — I6523 Occlusion and stenosis of bilateral carotid arteries: Secondary | ICD-10-CM | POA: Diagnosis present

## 2017-02-07 DIAGNOSIS — I447 Left bundle-branch block, unspecified: Secondary | ICD-10-CM | POA: Diagnosis present

## 2017-02-07 DIAGNOSIS — Z79899 Other long term (current) drug therapy: Secondary | ICD-10-CM | POA: Diagnosis not present

## 2017-02-07 DIAGNOSIS — I70213 Atherosclerosis of native arteries of extremities with intermittent claudication, bilateral legs: Secondary | ICD-10-CM | POA: Diagnosis present

## 2017-02-07 DIAGNOSIS — Z87891 Personal history of nicotine dependence: Secondary | ICD-10-CM | POA: Diagnosis not present

## 2017-02-07 DIAGNOSIS — Z955 Presence of coronary angioplasty implant and graft: Secondary | ICD-10-CM

## 2017-02-07 DIAGNOSIS — I200 Unstable angina: Secondary | ICD-10-CM

## 2017-02-07 DIAGNOSIS — Z7982 Long term (current) use of aspirin: Secondary | ICD-10-CM

## 2017-02-07 DIAGNOSIS — E782 Mixed hyperlipidemia: Secondary | ICD-10-CM | POA: Diagnosis present

## 2017-02-07 DIAGNOSIS — M1990 Unspecified osteoarthritis, unspecified site: Secondary | ICD-10-CM | POA: Diagnosis present

## 2017-02-07 DIAGNOSIS — R079 Chest pain, unspecified: Secondary | ICD-10-CM

## 2017-02-07 DIAGNOSIS — Z96651 Presence of right artificial knee joint: Secondary | ICD-10-CM | POA: Diagnosis present

## 2017-02-07 DIAGNOSIS — I252 Old myocardial infarction: Secondary | ICD-10-CM | POA: Diagnosis not present

## 2017-02-07 LAB — BASIC METABOLIC PANEL
Anion gap: 7 (ref 5–15)
BUN: 30 mg/dL — ABNORMAL HIGH (ref 6–20)
CO2: 25 mmol/L (ref 22–32)
Calcium: 9.1 mg/dL (ref 8.9–10.3)
Chloride: 105 mmol/L (ref 101–111)
Creatinine, Ser: 1.08 mg/dL (ref 0.61–1.24)
GFR calc Af Amer: >60 mL/min (ref >60)
GFR calc non Af Amer: >60 mL/min (ref >60)
Glucose, Bld: 105 mg/dL — ABNORMAL HIGH (ref 65–99)
Potassium: 4.6 mmol/L (ref 3.5–5.1)
Sodium: 137 mmol/L (ref 135–145)

## 2017-02-07 LAB — TROPONIN I
Troponin I: <0.03 ng/mL (ref <0.03)
Troponin I: <0.03 ng/mL (ref <0.03)
Troponin I: <0.03 ng/mL (ref <0.03)

## 2017-02-07 LAB — GLUCOSE, CAPILLARY: GLUCOSE-CAPILLARY: 96 mg/dL (ref 65–99)

## 2017-02-07 LAB — PROTIME-INR
INR: 0.95
Prothrombin Time: 12.6 seconds (ref 11.4–15.2)

## 2017-02-07 LAB — MRSA PCR SCREENING: MRSA BY PCR: NEGATIVE

## 2017-02-07 LAB — PROCALCITONIN: Procalcitonin: <0.1 ng/mL

## 2017-02-07 LAB — CBC
HCT: 46.6 % (ref 40.0–52.0)
Hemoglobin: 15.8 g/dL (ref 13.0–18.0)
MCH: 33 pg (ref 26.0–34.0)
MCHC: 34 g/dL (ref 32.0–36.0)
MCV: 97.1 fL (ref 80.0–100.0)
Platelets: 202 10*3/uL (ref 150–440)
RBC: 4.8 MIL/uL (ref 4.40–5.90)
RDW: 14.3 % (ref 11.5–14.5)
WBC: 9.3 10*3/uL (ref 3.8–10.6)

## 2017-02-07 LAB — APTT: aPTT: 29 seconds (ref 24–36)

## 2017-02-07 MED ORDER — RANOLAZINE ER 500 MG PO TB12
500.0000 mg | ORAL_TABLET | Freq: Two times a day (BID) | ORAL | Status: DC
  Administered 2017-02-07 – 2017-02-09 (×4): 500 mg via ORAL
  Filled 2017-02-07 (×6): qty 1

## 2017-02-07 MED ORDER — ASPIRIN 81 MG PO CHEW
324.0000 mg | CHEWABLE_TABLET | Freq: Once | ORAL | Status: AC
  Administered 2017-02-07: 324 mg via ORAL
  Filled 2017-02-07: qty 4

## 2017-02-07 MED ORDER — PAROXETINE HCL 30 MG PO TABS
30.0000 mg | ORAL_TABLET | Freq: Every day | ORAL | Status: DC
  Administered 2017-02-08 – 2017-02-09 (×2): 30 mg via ORAL
  Filled 2017-02-07 (×3): qty 1

## 2017-02-07 MED ORDER — LOSARTAN POTASSIUM 50 MG PO TABS
25.0000 mg | ORAL_TABLET | Freq: Every day | ORAL | Status: DC
  Administered 2017-02-08 – 2017-02-09 (×2): 25 mg via ORAL
  Filled 2017-02-07 (×2): qty 1

## 2017-02-07 MED ORDER — ARFORMOTEROL TARTRATE 15 MCG/2ML IN NEBU
15.0000 ug | INHALATION_SOLUTION | Freq: Two times a day (BID) | RESPIRATORY_TRACT | Status: DC
  Filled 2017-02-07: qty 2

## 2017-02-07 MED ORDER — ISOSORBIDE MONONITRATE ER 30 MG PO TB24
60.0000 mg | ORAL_TABLET | Freq: Two times a day (BID) | ORAL | Status: DC
  Administered 2017-02-07 – 2017-02-08 (×2): 60 mg via ORAL
  Filled 2017-02-07 (×2): qty 2

## 2017-02-07 MED ORDER — ASPIRIN EC 81 MG PO TBEC
81.0000 mg | DELAYED_RELEASE_TABLET | Freq: Every day | ORAL | Status: DC
  Administered 2017-02-08 – 2017-02-09 (×2): 81 mg via ORAL
  Filled 2017-02-07 (×2): qty 1

## 2017-02-07 MED ORDER — NITROGLYCERIN IN D5W 200-5 MCG/ML-% IV SOLN
0.0000 ug/min | INTRAVENOUS | Status: DC
  Administered 2017-02-07: 5 ug/min via INTRAVENOUS
  Administered 2017-02-07: 30 ug/min via INTRAVENOUS
  Administered 2017-02-07: 40 ug/min via INTRAVENOUS
  Filled 2017-02-07: qty 250

## 2017-02-07 MED ORDER — HEPARIN (PORCINE) IN NACL 100-0.45 UNIT/ML-% IJ SOLN
1000.0000 [IU]/h | INTRAMUSCULAR | Status: DC
  Administered 2017-02-07: 1000 [IU]/h via INTRAVENOUS
  Filled 2017-02-07: qty 250

## 2017-02-07 MED ORDER — ACETAMINOPHEN 325 MG PO TABS
650.0000 mg | ORAL_TABLET | ORAL | Status: DC | PRN
  Administered 2017-02-09: 650 mg via ORAL
  Filled 2017-02-07: qty 2

## 2017-02-07 MED ORDER — NITROGLYCERIN IN D5W 200-5 MCG/ML-% IV SOLN: 0.0000 ug/min | INTRAVENOUS | Status: DC

## 2017-02-07 MED ORDER — ONDANSETRON HCL 4 MG/2ML IJ SOLN: 4.0000 mg | Freq: Four times a day (QID) | INTRAMUSCULAR | Status: DC | PRN

## 2017-02-07 MED ORDER — TIOTROPIUM BROMIDE MONOHYDRATE 18 MCG IN CAPS
1.0000 | ORAL_CAPSULE | Freq: Every day | RESPIRATORY_TRACT | Status: DC
  Administered 2017-02-09: 18 ug via RESPIRATORY_TRACT
  Filled 2017-02-07: qty 5

## 2017-02-07 MED ORDER — ALBUTEROL SULFATE (2.5 MG/3ML) 0.083% IN NEBU
2.5000 mg | INHALATION_SOLUTION | Freq: Four times a day (QID) | RESPIRATORY_TRACT | Status: DC | PRN
  Administered 2017-02-07: 2.5 mg via RESPIRATORY_TRACT
  Filled 2017-02-07 (×2): qty 3

## 2017-02-07 MED ORDER — PANTOPRAZOLE SODIUM 40 MG PO TBEC
40.0000 mg | DELAYED_RELEASE_TABLET | Freq: Every day | ORAL | Status: DC
  Administered 2017-02-08 – 2017-02-09 (×2): 40 mg via ORAL
  Filled 2017-02-07 (×2): qty 1

## 2017-02-07 MED ORDER — ARFORMOTEROL TARTRATE 15 MCG/2ML IN NEBU
15.0000 ug | INHALATION_SOLUTION | Freq: Two times a day (BID) | RESPIRATORY_TRACT | Status: DC
  Administered 2017-02-07 – 2017-02-09 (×4): 15 ug via RESPIRATORY_TRACT
  Filled 2017-02-07 (×6): qty 2

## 2017-02-07 MED ORDER — HEPARIN BOLUS VIA INFUSION
4000.0000 [IU] | Freq: Once | INTRAVENOUS | Status: AC
  Administered 2017-02-07: 4000 [IU] via INTRAVENOUS
  Filled 2017-02-07: qty 4000

## 2017-02-07 MED ORDER — BUDESONIDE 0.5 MG/2ML IN SUSP
0.5000 mg | Freq: Two times a day (BID) | RESPIRATORY_TRACT | Status: DC
  Administered 2017-02-07 – 2017-02-08 (×2): 0.5 mg via RESPIRATORY_TRACT
  Filled 2017-02-07 (×2): qty 2

## 2017-02-07 MED ORDER — METOPROLOL TARTRATE 25 MG PO TABS
25.0000 mg | ORAL_TABLET | Freq: Two times a day (BID) | ORAL | Status: DC
  Administered 2017-02-07 – 2017-02-09 (×4): 25 mg via ORAL
  Filled 2017-02-07 (×4): qty 1

## 2017-02-07 MED ORDER — ATORVASTATIN CALCIUM 20 MG PO TABS
80.0000 mg | ORAL_TABLET | Freq: Every evening | ORAL | Status: DC
  Administered 2017-02-07 – 2017-02-09 (×3): 80 mg via ORAL
  Filled 2017-02-07 (×3): qty 4

## 2017-02-07 MED ORDER — MORPHINE SULFATE (PF) 2 MG/ML IV SOLN: 1.0000 mg | INTRAVENOUS | Status: DC | PRN

## 2017-02-07 NOTE — Consult Note (Signed)
Name: Donald Juarez MRN: [id number] DOB: 1945-12-05    ADMISSION DATE:  02/07/2017 CONSULTATION DATE: 02/07/2017  REFERRING MD : Dr. Margaretmary Eddy   CHIEF COMPLAINT: Chest Pain   BRIEF PATIENT DESCRIPTION:  72 yo admitted with unstable angina and hypertension requiring nitroglycerin gtt   SIGNIFICANT EVENTS  01/9-Pt admitted to stepdown unit   STUDIES:  Echo 01/10>>  HISTORY OF PRESENT ILLNESS:   This is a 72 yo male with a PMH of Syncope, Sick Sinus Syndrome, Pacemaker, Seizures, Rectal Cancer s/p Colostomy, COPD, Right Pulmonary Nodule, Paroxysmal Atrial Flutter, MI, Hyperlipidemia, HTN, Dysrhythmia, CAD, Carotid Stenosis, Bilateral Claudication of Lower Extremities, Asthma, Arthritis, Anxiety, and Angina.  He presented to [facility] ER via EMS 01/9 with c/o crushing chest pain radiating to left arm, jaw pain, shortness of breath, and diapharesis. Pt states due to symptoms he took 2 sublingual nitro and the pain stopped for 20-30 mins.  However the pain returned and he realized he was hypertensive, therefore he took another nitro and notified EMS.  Upon EMS arival he received 324 mg po aspirin.  In the ER EKG revealed pt ventricular paced with left BBB and hr 65 no dynamic changes noted and pt remained hypertensive systolic bp 299'M.  Pt started on nitroglycerin and heparin gtts per cardiology recommendations.  He was subsequently admitted to the stepdown unit by hospitalist team for further workup and treatment.   PAST MEDICAL HISTORY :   has a past medical history of Anginal pain ([place]), Anxiety, Arthritis, Asthma, Bilateral claudication of lower limb ([place]), Carotid stenosis, Cataract of left eye, Cataract, right, COPD (chronic obstructive pulmonary disease) ([place]), Coronary artery disease, Dysrhythmia, Essential hypertension, Hyperkalemia, Hyperlipidemia, Myocardial infarction ([place]) (2015), Paroxysmal atrial flutter ([place]), Prostate cancer ([place]), Pulmonary nodule, right, Rectal cancer ([place]), Seizures  ([place]), Shingles, SSS (sick sinus syndrome) ([place]), and Syncope and collapse.  has a past surgical history that includes Cardiac surgery (2011); cataract surgery (2012); colon surgery; Knee arthroscopy (Right, 1980's); Ablation (10/31/2013); Atrial flutter ablation (N/A, 10/31/2013); Cardiac catheterization (07/2009); Cardiac catheterization (01/2013); Cardiac catheterization (03/2014); Coronary angioplasty; Colon surgery; Cataract extraction w/PHACO (Left, 06/01/2014); Cataract extraction (Left); Cardiac catheterization (N/A, 11/02/2014); Cardiac catheterization (N/A, 11/02/2014); Cardiac catheterization (N/A, 11/03/2014); Insert / replace / remove pacemaker (2011); pacemaker placement (01/17/2010); Total knee arthroplasty (Right, 01/14/2016); and Colonoscopy with propofol (N/A, 05/24/2016). Prior to Admission medications   Medication Sig Start Date End Date Taking? Authorizing Provider  albuterol (PROAIR HFA) 108 (90 Base) MCG/ACT inhaler Inhale 2 puffs into the lungs every 4 (four) hours as needed for wheezing or shortness of breath. 07/11/16  Yes Flora Lipps, MD  albuterol (PROVENTIL) (2.5 MG/3ML) 0.083% nebulizer solution Take 3 mLs (2.5 mg total) by nebulization every 6 (six) hours as needed for wheezing or shortness of breath. DX:J44.9 10/04/16  Yes Flora Lipps, MD  arformoterol (BROVANA) 15 MCG/2ML NEBU Take 2 mLs (15 mcg total) by nebulization 2 (two) times daily. Take 2 mLs (15 mcg total) by nebulization 2 (two) times daily. DX code 493.20 Every morning and 1800 07/11/16  Yes Flora Lipps, MD  aspirin EC 81 MG tablet Take 81 mg by mouth daily.   Yes [provider]  atorvastatin (LIPITOR) 80 MG tablet Take 1 tablet (80 mg total) by mouth daily. 05/09/16 02/07/17 Yes Wende Bushy, MD  budesonide (PULMICORT) 0.5 MG/2ML nebulizer solution Take 2 mLs (0.5 mg total) by nebulization 2 (two) times daily. DX code 493.20 Every morning and 1800 07/11/16  Yes Flora Lipps, MD  budesonide-formoterol [facility])  160-4.5 MCG/ACT inhaler Inhale 2 puffs into the lungs 2 (two) times daily.   Yes [provider]  isosorbide mononitrate (IMDUR) 120 MG 24 hr tablet Take one tablet (120 mg) by mouth at bedtime Patient taking differently: Take 60 mg by mouth 2 (two) times daily.  03/28/16  Yes [name], MD  losartan (COZAAR) 25 MG tablet Take 25 mg by mouth daily.   Yes [provider]  meclizine (ANTIVERT) 12.5 MG tablet Take 1-2 tablets by mouth 3 (three) times daily as needed. 01/05/17  Yes [provider]  metoprolol tartrate (LOPRESSOR) 25 MG tablet TAKE ONE TABLET BY MOUTH IN THE MORNING AND TAKE TWO TABLETS BY MOUTHIN THE EVENING. Patient taking differently: Take 25-50 mg by mouth 2 (two) times daily. TAKE ONE TABLET BY MOUTH IN THE MORNING AND TAKE TWO TABLETS BY MOUTHIN THE EVENING. 01/24/17  Yes [name], MD  nitroGLYCERIN (NITROSTAT) 0.4 MG SL tablet Place 1 tablet (0.4 mg total) under the tongue every 5 (five) minutes as needed for chest pain. 05/09/16  Yes Wende Bushy, MD  omeprazole (PRILOSEC) 40 MG capsule Take 40 mg by mouth daily.   Yes [provider]  PARoxetine (PAXIL) 30 MG tablet Take 30 mg by mouth daily.    Yes [provider]  ranolazine (RANEXA) 500 MG 12 hr tablet Take 1,000 mg by mouth 2 (two) times daily.    Yes [provider]  traMADol (ULTRAM) 50 MG tablet Take 1-2 tablets (50-100 mg total) by mouth every 4 (four) hours as needed for moderate pain. Patient taking differently: Take 50 mg by mouth every 6 (six) hours as needed for moderate pain.  01/16/16  Yes Duanne Guess, PA-C  Cholecalciferol (VITAMIN D) 2000 UNITS tablet Take 2,000 Units by mouth daily.    [provider]  levofloxacin (LEVAQUIN) 750 MG tablet Take 1 tablet (750 mg total) by mouth daily. Patient not taking: Reported on 02/07/2017 09/07/16   Frederich Cha, MD  predniSONE (DELTASONE) 10 MG tablet Take 5 tablets (50 mg total) by mouth daily with  breakfast. X 10 days Patient not taking: Reported on 02/07/2017 07/11/16   Flora Lipps, MD  predniSONE (STERAPRED UNI-PAK 21 TAB) 10 MG (21) TBPK tablet Sig 6 tablet day 1, 5 tablets day 2, 4 tablets day 3,,3tablets day 4, 2 tablets day 5, 1 tablet day 6 take all tablets orally Patient not taking: Reported on 02/07/2017 09/07/16   Frederich Cha, MD  Tiotropium Bromide Monohydrate (SPIRIVA RESPIMAT) 1.25 MCG/ACT AERS Inhale 2 puffs into the lungs 2 (two) times daily. Patient not taking: Reported on 02/07/2017 07/11/16   Flora Lipps, MD   No Known Allergies  FAMILY HISTORY:  family history includes Cancer in his mother; Heart attack in his father; Heart disease in his father; Hyperlipidemia in his father and mother; Hypertension in his father and mother. SOCIAL HISTORY:  reports that he quit smoking about 8 years ago. His smoking use included cigarettes. He has a 30.00 pack-year smoking history. he has never used smokeless tobacco. He reports that he does not drink alcohol or use drugs.  REVIEW OF SYSTEMS: Positives in BOLD  Constitutional: fever, chills, weight loss, malaise/fatigue and diaphoresis.  HENT: Negative for hearing loss, ear pain, nosebleeds, congestion, sore throat, neck pain, tinnitus and ear discharge.   Eyes: Negative for blurred vision, double vision, photophobia, pain, discharge and redness.  Respiratory: cough, hemoptysis, sputum production, shortness of breath, wheezing and stridor.   Cardiovascular: chest pain,  palpitations, orthopnea, claudication, leg swelling and PND.  Gastrointestinal: Negative for heartburn, nausea, vomiting, abdominal pain, diarrhea, constipation, blood in stool and melena.  Genitourinary: Negative for dysuria, urgency, frequency, hematuria and flank pain.  Musculoskeletal: Negative for myalgias, back pain, joint pain and falls.  Skin: Negative for itching and rash.  Neurological: Negative for dizziness, tingling, tremors, sensory change, speech change, focal  weakness, seizures, loss of consciousness, weakness and headaches.  Endo/Heme/Allergies: Negative for environmental allergies and polydipsia. Does not bruise/bleed easily.  SUBJECTIVE:  Pt states chest pain has resolved he still has some mild shortness of breath.  VITAL SIGNS: Temp:  [97.8 F (36.6 C)-98.2 F (36.8 C)] 97.8 F (36.6 C) (01/09 1929) Pulse Rate:  [63-66] 64 (01/09 1845) Resp:  [15-22] 15 (01/09 1845) BP: (112-205)/(70-132) 135/72 (01/09 1845) SpO2:  [89 %-97 %] 92 % (01/09 1845) Weight:  [99.7 kg (219 lb 12.8 oz)-99.8 kg (220 lb)] 99.7 kg (219 lb 12.8 oz) (01/09 1929)  PHYSICAL EXAMINATION: General: well developed, well nourished male, NAD  Neuro: alert and oriented, follows commands HEENT: supple, no JVD  Cardiovascular: vpaced, no M/R/G Lungs: inspiratory wheezes throughout, even, non labored  Abdomen: left colostomy, +BS x4, soft, non tender, non distended Musculoskeletal: normal bulk and tone, no edema  Skin: scabbed over abrasion left upper arm and redness bilateral feet   Recent Labs  Lab 02/07/17 1655  NA 137  K 4.6  CL 105  CO2 25  BUN 30*  CREATININE 1.08  GLUCOSE 105*   Recent Labs  Lab 02/07/17 1655  HGB 15.8  HCT 46.6  WBC 9.3  PLT 202   Dg Chest Port 1 View  Result Date: 02/07/2017 CLINICAL DATA:  Left-sided chest pain. EXAM: PORTABLE CHEST 1 VIEW COMPARISON:  04/27/2016 FINDINGS: Dual lead cardiac pacemaker. Cardiomediastinal silhouette is normal. Mediastinal contours appear intact. Calcific atherosclerotic disease of the aorta. There is no evidence of focal airspace consolidation, pleural effusion or pneumothorax. Osseous structures are without acute abnormality. Soft tissues are grossly normal. IMPRESSION: Calcific atherosclerotic disease of the aorta. Otherwise no evidence of acute cardiopulmonary disease. Electronically Signed   By: Fidela Salisbury M.D.   On: 02/07/2017 18:31    ASSESSMENT / PLAN: Unstable Angina Malignant  Hypertension  Hx: COPD, Asthma, Pacemaker, Hyperlipidemia, and MI P: Supplemental O2 for dyspnea and/or hypoxia  Prn CXR Continue outpatient bronchodilator therapy Continue nebulized steroids  Continuous telemetry monitoring  Trend troponin's Continue nitroglycerin gtt titrate for systolic bp <182 and/or until chest pain free Continue heparin gtt NPO after midnight for cardiac cath in am Cardiology consulted appreciate input  Prn morphine for pain management Continue outpatient cardiac medications  Trend CBC  Monitor for s/sx of bleeding  Trend WBC and monitor fever curve  Check PCT   Marda Stalker, [place] Pager [phone] (please enter 7 digits) PCCM Consult Pager [phone] (please enter 7 digits)  Pulmonary/critical care attending  Have personally seen and examined Mr. Marcello Moores, reviewed revise and confirm nurse practitioner's note. I performed history, physical, independently reviewed laboratory and imaging studies. In short Mr. Andel is a very pleasant 72 year old gentleman with a past medical history remarkable for hypertension, COPD, asthma, status post pacemaker implantation, hyperlipidemia and coronary artery disease, status post PCI and stent placement presents with recurrent and persistent chest pain. Has been seen by cardiology. Is presently on heparin and dual antiplatelet agent, pending cardiac catheterization.  Hermelinda Dellen, D.O.

## 2017-02-07 NOTE — ED Notes (Signed)
Pt has colostomy this RN burped it.

## 2017-02-07 NOTE — ED Notes (Signed)
EDP at bedside gave verbal order to up dose of IV Nitro to 30 mcg/min.

## 2017-02-07 NOTE — ED Notes (Signed)
This RN attempted to call report at 640 pm. Not able to be transported at this time.RN will continue to monitor.

## 2017-02-07 NOTE — ED Notes (Signed)
2nd EKG done at this time. EDP [place] aware

## 2017-02-07 NOTE — H&P (Addendum)
[facility] NAME: Donald Juarez    MR#:  [id number]  DATE OF BIRTH:  12-Jan-1946  DATE OF ADMISSION:  02/07/2017  PRIMARY CARE PHYSICIAN: Sofie Hartigan, MD   REQUESTING/REFERRING PHYSICIAN: Carrie Mew, MD  CHIEF COMPLAINT:  Intractable chest pain with malignant hypertension  HISTORY OF PRESENT ILLNESS:  Donald Juarez  is a 72 y.o. male with a known history of coronary artery disease status post 3 stents in the past, essential hypertension, hyperlipidemia and COPD came into the ED with recurrent crushing chest pain which was started at 12:30 PM today while he was doing grocery shopping.  Patient was having crushing chest pain with the drenching sweats and shortness of breath he took 2 sublingual nitroglycerin and subsequently the pain was relieved.  The chest pain recurred while he was at the billing counter radiating to the jaw and left arm again he took 2 sublingual nitroglycerin, and he refused to go to ED and went home.  Pain recurred third time, at that time patient decided to come to the hospital.  Initial troponin is negative , EKG with no acute ST-T wave changes but the patient was having crushing chest heaviness radiating to the left jaw and shoulder associated with diaphoresis and shortness of breath . patient's blood pressure was elevated systolic blood pressure at around 528-413'K and diastolic around 440 patient is started on nitroglycerin drip.  ED physician has discussed with on-call cardiologist Donald Juarez, started heparin drip as well.  Hospitalist team was called to admit the patient.  During my examination after starting nitroglycerin drip patient's chest pain significantly improved and blood pressure improved.  PAST MEDICAL HISTORY:   Past Medical History:  Diagnosis Date  . Anginal pain ([place])    feels this as jaw pain.  takes imdur for this. rarely uses nitro.  [name] Anxiety   . Arthritis   . Asthma   .  Bilateral claudication of lower limb ([place])   . Carotid stenosis    a. 07/2013 Carotid U/S: 100% RICA (pt prev reported h/o 10%), LICA 27-25%, bilat ECA 50%, normal vertebrals and subclavians bilat-->F/U needed in 01/2014.  . Cataract of left eye   . Cataract, right    a. 10/2010 s/p cataract surgery   . COPD (chronic obstructive pulmonary disease) ([place])   . Coronary artery disease    a. 06/1996 & 03/2005 Cath: non-obs dzs;  b. 07/2009 NSTEMI/Cath: LCX 99->PCI/DES extending into OM1;  c. 01/2013 Cath: patent LCX stent-->Med Rx.; d. cath 03/2014: RPAV dzs->Med rx; e. 10/2014 Cath/PCI: LM 30, LAD 20p/m, LCX 50ost/p, 10 ISR, 60d, OM1 min irregs, OM2 50, RCA 30ost, 90m, 60d, RPDA 70(2.5x8 Xience DES), RPAV 90small(PTCA); f. 10/2014 Relook Cath: RPAV 99(Med Rx), EF 45-50.  [name] Dysrhythmia    since ablation, flutter resolved.  . Essential hypertension   . Hyperkalemia   . Hyperlipidemia   . Myocardial infarction ([place]) 2015   had stent inserted and then had heart attack after. stent inserted due to blockage  . Paroxysmal atrial flutter ([place])    a. CHA2DS2VASc = 3 -> eliquis;  b. 07/2013 Echo: EF 50-55%, mildly dil LA.c. s/p RFCA 10/31/2013 by Dr Caryl Comes  . Prostate cancer ([place])   . Pulmonary nodule, right    a. 0.7cm RLL - stable by CT 06/2013.  [name] Rectal cancer ([place])    a. Status post colostomy  . Seizures ([place])    a. last 30 years ago  .  Shingles    a. 09/2012.  . SSS (sick sinus syndrome) ([place])    a. 12/2009 S/P MDT [place] ADDR01 DC PPM, ser # [id number] ([name]).  . Syncope and collapse     PAST SURGICAL HISTOIRY:   Past Surgical History:  Procedure Laterality Date  . ABLATION  10/31/2013   CTI ablation by Dr Caryl Comes for atrial flutter  . ATRIAL FLUTTER ABLATION N/A 10/31/2013   Procedure: ATRIAL FLUTTER ABLATION;  Surgeon: [name], MD;  Location[facility] CATH LAB;  Service: Cardiovascular;  Laterality: N/A;  . CARDIAC CATHETERIZATION  07/2009   [facility]  . CARDIAC CATHETERIZATION  01/2013   [facility]   . CARDIAC CATHETERIZATION  03/2014   [facility]  . CARDIAC CATHETERIZATION N/A 11/02/2014   Procedure: Left Heart Cath and Cors/Grafts Angiography;  Surgeon: [name], MD;  Location: [facility] CV LAB;  Service: Cardiovascular;  Laterality: N/A;  . CARDIAC CATHETERIZATION N/A 11/02/2014   Procedure: Coronary Stent Intervention;  Surgeon: [name], MD;  Location: [facility] CV LAB;  Service: Cardiovascular;  Laterality: N/A;  . CARDIAC CATHETERIZATION N/A 11/03/2014   Procedure: Left Heart Cath and Coronary Angiography;  Surgeon: [name], MD;  Location: [place] CV LAB;  Service: Cardiovascular;  Laterality: N/A;  . CARDIAC SURGERY  2011   Stents placed   . CATARACT EXTRACTION Left   . CATARACT EXTRACTION W/PHACO Left 06/01/2014   Procedure: CATARACT EXTRACTION PHACO AND INTRAOCULAR LENS PLACEMENT (IOC);  Surgeon: [name], MD;  Location: [name] ORS;  Service: Ophthalmology;  Laterality: Left;  . cataract surgery  2012  . colon surgery     cancer removal  . COLON SURGERY     colostomy  . COLONOSCOPY WITH PROPOFOL N/A 05/24/2016   Procedure: COLONOSCOPY WITH PROPOFOL;  Surgeon: [name], MD;  Location: [facility] ENDOSCOPY;  Service: Endoscopy;  Laterality: N/A;  . CORONARY ANGIOPLASTY    . INSERT / REPLACE / REMOVE PACEMAKER  2011   just a pacer  . KNEE ARTHROSCOPY Right 1980's  . PACEMAKER PLACEMENT  01/17/2010  . TOTAL KNEE ARTHROPLASTY Right 01/14/2016   Procedure: TOTAL KNEE ARTHROPLASTY;  Surgeon: [name], MD;  Location: [name] ORS;  Service: Orthopedics;  Laterality: Right;    SOCIAL HISTORY:   Social History   Tobacco Use  . Smoking status: Former Smoker    Packs/day: 1.00    Years: 30.00    Pack years: 30.00    Types: Cigarettes    Last attempt to quit: 07/26/2008    Years since quitting: 8.5  . Smokeless tobacco: Never Used  Substance Use Topics  . Alcohol use: No    FAMILY HISTORY:   Family History  Problem Relation Age  of Onset  . Cancer Mother        'male cancer"  . Hypertension Mother   . Hyperlipidemia Mother   . Heart disease Father   . Heart attack Father   . Hypertension Father   . Hyperlipidemia Father     DRUG ALLERGIES:  No Known Allergies  REVIEW OF SYSTEMS:  CONSTITUTIONAL: No fever, fatigue or weakness.  EYES: No blurred or double vision.  EARS, NOSE, AND THROAT: No tinnitus or ear pain.  RESPIRATORY: No cough, shortness of breath, wheezing or hemoptysis.  CARDIOVASCULAR: No chest pain, orthopnea, edema.  GASTROINTESTINAL: No nausea, vomiting, diarrhea or abdominal pain.  GENITOURINARY: No dysuria, hematuria.  ENDOCRINE: No polyuria, nocturia,  HEMATOLOGY: No anemia, easy bruising or bleeding SKIN: No rash or lesion.  MUSCULOSKELETAL: No joint pain or arthritis.   NEUROLOGIC: No tingling, numbness, weakness.  PSYCHIATRY: No anxiety or depression.   MEDICATIONS AT HOME:   Prior to Admission medications   Medication Sig Start Date End Date Taking? Authorizing Provider  albuterol (PROAIR HFA) 108 (90 Base) MCG/ACT inhaler Inhale 2 puffs into the lungs every 4 (four) hours as needed for wheezing or shortness of breath. 07/11/16  Yes Flora Lipps, MD  albuterol (PROVENTIL) (2.5 MG/3ML) 0.083% nebulizer solution Take 3 mLs (2.5 mg total) by nebulization every 6 (six) hours as needed for wheezing or shortness of breath. DX:J44.9 10/04/16  Yes Flora Lipps, MD  aspirin EC 81 MG tablet Take 81 mg by mouth daily.   Yes [provider]  atorvastatin (LIPITOR) 80 MG tablet Take 1 tablet (80 mg total) by mouth daily. 05/09/16 02/07/17 Yes Wende Bushy, MD  isosorbide mononitrate (IMDUR) 120 MG 24 hr tablet Take one tablet (120 mg) by mouth at bedtime Patient taking differently: Take 60 mg by mouth 2 (two) times daily.  03/28/16  Yes [name], MD  losartan (COZAAR) 25 MG tablet Take 25 mg by mouth daily.   Yes [provider]  meclizine (ANTIVERT) 12.5 MG tablet Take 1-2  tablets by mouth 3 (three) times daily as needed. 01/05/17  Yes [provider]  metoprolol tartrate (LOPRESSOR) 25 MG tablet TAKE ONE TABLET BY MOUTH IN THE MORNING AND TAKE TWO TABLETS BY MOUTHIN THE EVENING. Patient taking differently: Take 25-50 mg by mouth 2 (two) times daily. TAKE ONE TABLET BY MOUTH IN THE MORNING AND TAKE TWO TABLETS BY MOUTHIN THE EVENING. 01/24/17  Yes [name], MD  nitroGLYCERIN (NITROSTAT) 0.4 MG SL tablet Place 1 tablet (0.4 mg total) under the tongue every 5 (five) minutes as needed for chest pain. 05/09/16  Yes Wende Bushy, MD  omeprazole (PRILOSEC) 40 MG capsule Take 40 mg by mouth daily.   Yes [provider]  PARoxetine (PAXIL) 30 MG tablet Take 30 mg by mouth daily.    Yes [provider]  ranolazine (RANEXA) 500 MG 12 hr tablet Take 500 mg by mouth 2 (two) times daily.   Yes [provider]  traMADol (ULTRAM) 50 MG tablet Take 1-2 tablets (50-100 mg total) by mouth every 4 (four) hours as needed for moderate pain. Patient taking differently: Take 50 mg by mouth every 6 (six) hours as needed for moderate pain.  01/16/16  Yes Duanne Guess, PA-C  arformoterol (BROVANA) 15 MCG/2ML NEBU Take 2 mLs (15 mcg total) by nebulization 2 (two) times daily. Take 2 mLs (15 mcg total) by nebulization 2 (two) times daily. DX code 493.20 Every morning and 1800 Patient not taking: Reported on 02/07/2017 07/11/16   Flora Lipps, MD  budesonide (PULMICORT) 0.5 MG/2ML nebulizer solution Take 2 mLs (0.5 mg total) by nebulization 2 (two) times daily. DX code 493.20 Every morning and 1800 Patient not taking: Reported on 02/07/2017 07/11/16   Flora Lipps, MD  Cholecalciferol (VITAMIN D) 2000 UNITS tablet Take 2,000 Units by mouth daily.    [provider]  degarelix (FIRMAGON) 120 MG injection Inject into the skin every 3 (three) months.     [provider]  levofloxacin (LEVAQUIN) 750 MG tablet Take 1 tablet (750 mg total) by  mouth daily. Patient not taking: Reported on 02/07/2017 09/07/16   Frederich Cha, MD  predniSONE (DELTASONE) 10 MG tablet Take 5 tablets (50 mg total) by mouth daily with breakfast. X 10 days  Patient not taking: Reported on 02/07/2017 07/11/16   Flora Lipps, MD  predniSONE (STERAPRED UNI-PAK 21 TAB) 10 MG (21) TBPK tablet Sig 6 tablet day 1, 5 tablets day 2, 4 tablets day 3,,3tablets day 4, 2 tablets day 5, 1 tablet day 6 take all tablets orally Patient not taking: Reported on 02/07/2017 09/07/16   Frederich Cha, MD  Tiotropium Bromide Monohydrate (SPIRIVA RESPIMAT) 1.25 MCG/ACT AERS Inhale 2 puffs into the lungs 2 (two) times daily. Patient not taking: Reported on 02/07/2017 07/11/16   Flora Lipps, MD      VITAL SIGNS:  Blood pressure 116/78, pulse 65, temperature 98.2 F (36.8 C), temperature source Oral, resp. rate (!) 22, height 5\' 9"  (1.753 m), weight 99.8 kg (220 lb), SpO2 94 %.  PHYSICAL EXAMINATION:  GENERAL:  72 y.o.-year-old patient lying in the bed with no acute distress.  EYES: Pupils equal, round, reactive to light and accommodation. No scleral icterus. Extraocular muscles intact.  HEENT: Head atraumatic, normocephalic. Oropharynx and nasopharynx clear.  NECK:  Supple, no jugular venous distention. No thyroid enlargement, no tenderness.  LUNGS: Normal breath sounds bilaterally, no wheezing, rales,rhonchi or crepitation. No use of accessory muscles of respiration.  CARDIOVASCULAR: S1, S2 normal. No murmurs, rubs, or gallops.  ABDOMEN: Soft, nontender, nondistended. Bowel sounds present. No organomegaly or mass.  EXTREMITIES: No pedal edema, cyanosis, or clubbing.  NEUROLOGIC: Cranial nerves II through XII are intact. Muscle strength 5/5 in all extremities. Sensation intact. Gait not checked.  PSYCHIATRIC: The patient is alert and oriented x 3.  SKIN: No obvious rash, lesion, or ulcer.   LABORATORY PANEL:   CBC Recent Labs  Lab 02/07/17 1655  WBC 9.3  HGB 15.8  HCT 46.6  PLT 202    ------------------------------------------------------------------------------------------------------------------  Chemistries  Recent Labs  Lab 02/07/17 1655  NA 137  K 4.6  CL 105  CO2 25  GLUCOSE 105*  BUN 30*  CREATININE 1.08  CALCIUM 9.1   ------------------------------------------------------------------------------------------------------------------  Cardiac Enzymes Recent Labs  Lab 02/07/17 1655  TROPONINI <0.03   ------------------------------------------------------------------------------------------------------------------  RADIOLOGY:  Dg Chest Port 1 View  Result Date: 02/07/2017 CLINICAL DATA:  Left-sided chest pain. EXAM: PORTABLE CHEST 1 VIEW COMPARISON:  04/27/2016 FINDINGS: Dual lead cardiac pacemaker. Cardiomediastinal silhouette is normal. Mediastinal contours appear intact. Calcific atherosclerotic disease of the aorta. There is no evidence of focal airspace consolidation, pleural effusion or pneumothorax. Osseous structures are without acute abnormality. Soft tissues are grossly normal. IMPRESSION: Calcific atherosclerotic disease of the aorta. Otherwise no evidence of acute cardiopulmonary disease. Electronically Signed   By: Fidela Salisbury M.D.   On: 02/07/2017 18:31    EKG:   Orders placed or performed during the hospital encounter of 02/07/17  . EKG 12-Lead  . EKG 12-Lead  . ED EKG within 10 minutes  . ED EKG within 10 minutes  . Repeat EKG  . Repeat EKG    IMPRESSION AND PLAN:   Tylek Boney  is a 72 y.o. male with a known history of coronary artery disease status post 3 stents in the past, essential hypertension, hyperlipidemia and COPD came into the ED with recurrent crushing chest pain which was started at 12:30 PM today while he was doing grocery shopping.  Patient was having crushing chest pain with the drenching sweats and shortness of breath he took 2 sublingual nitroglycerin and subsequently the pain was relieved.  The chest  pain recurred while he was at the billing counter radiating to the jaw and left arm again he  took 2 sublingual nitroglycerin, and he refused to go to ED and went home.  Pain recurred third time, at that time patient decided to come to the hospital.  Initial troponin is negative , EKG with no acute ST-T wave changes but the patient was having crushing chest heaviness radiating to the left jaw and shoulder associated with diaphoresis and shortness of breath . patient's blood pressure was elevated systolic blood pressure at around 967-893'Y and diastolic around 101 patient is started on nitroglycerin drip.  ED physician has discussed with on-call cardiologist Donald Juarez  #Unstable angina Admit to stepdown unit Nitroglycerin drip and heparin drip Will get echocardiogram Monitor patient on telemetry Cycle cardiac biomarkers Cardiology consult is placed to  Donald Juarez, discussed with him planning to do cardiac cath a.m..  N.p.o. after midnight Consult placed to intensivist, discussed with on-call PA Continue home medication aspirin, Imdur, Toprol, Cozaar, statin and Ranexa   #Malignant hypertension Patient is started on nitroglycerin drip, titrate for systolic blood pressure 751 Resume home medication Cozaar, Lopressor and titrate as needed  #History of coronary artery disease status post 3 stents in the past Continue home medication aspirin, statin, Imdur, Cozaar, Lopressor and Ranexa cardiology consult placed   #COPD No exacerbation at this time Albuterol as needed and continue Spiriva  #Hyperlipidemia Check fasting lipid panel Continue home medication Lipitor   GI prophylaxis with Protonix DVT prophylaxis patient is on heparin drip   All the records are reviewed and case discussed with ED provider. Management plans discussed with the patient, family and they are in agreement.  CODE STATUS: FC,wife HCPOA  TOTAL CRITICAL CARE TIME TAKING CARE OF THIS PATIENT: 45 minutes.    Note: This dictation was prepared with Dragon dictation along with smaller phrase technology. Any transcriptional errors that result from this process are unintentional.  Nicholes Mango M.D on 02/07/2017 at 6:38 PM  Between 7am to 6pm - Pager - [phone]  After 6pm go to [email or web address] - password [email or web address] [facility]  [facility]  Office  [phone]  CC: Primary care physician; Sofie Hartigan, MD

## 2017-02-07 NOTE — ED Notes (Signed)
This RN notified lab of the need for aPTT and INR.

## 2017-02-07 NOTE — ED Provider Notes (Signed)
[facility] Emergency Department Provider Note  ____________________________________________  Time seen: Approximately 5:18 PM  I have reviewed the triage vital signs and the nursing notes.   HISTORY  Chief Complaint Chest Pain    HPI Donald Juarez is a 72 y.o. male who complains of chest pain that started about 12:30 PM today. He took 2 nitroglycerin with resolution of the pain, then a few hours later started again, and he took 2 nitroglycerin and the pain resolved, now the pain is starting to reoccur.Pain is in the central chest, heaviness, radiating to the jaw and left arm, associated with shortness of breath and diaphoresis. No vomiting. Pain is worse with exertion, better with rest and nitroglycerin.  He does have a history of resting angina, but today's pain seems to be accelerated and refractory.     Past Medical History:  Diagnosis Date  . Anginal pain ([place])    feels this as jaw pain.  takes imdur for this. rarely uses nitro.  [name] Anxiety   . Arthritis   . Asthma   . Bilateral claudication of lower limb ([place])   . Carotid stenosis    a. 07/2013 Carotid U/S: 100% RICA (pt prev reported h/o 26%), LICA 94-85%, bilat ECA 50%, normal vertebrals and subclavians bilat-->F/U needed in 01/2014.  . Cataract of left eye   . Cataract, right    a. 10/2010 s/p cataract surgery   . COPD (chronic obstructive pulmonary disease) ([place])   . Coronary artery disease    a. 06/1996 & 03/2005 Cath: non-obs dzs;  b. 07/2009 NSTEMI/Cath: LCX 99->PCI/DES extending into OM1;  c. 01/2013 Cath: patent LCX stent-->Med Rx.; d. cath 03/2014: RPAV dzs->Med rx; e. 10/2014 Cath/PCI: LM 30, LAD 20p/m, LCX 50ost/p, 10 ISR, 60d, OM1 min irregs, OM2 50, RCA 30ost, 64m, 60d, RPDA 70(2.5x8 Xience DES), RPAV 90small(PTCA); f. 10/2014 Relook Cath: RPAV 99(Med Rx), EF 45-50.  [name] Dysrhythmia    since ablation, flutter resolved.  . Essential hypertension   . Hyperkalemia   . Hyperlipidemia   .  Myocardial infarction ([place]) 2015   had stent inserted and then had heart attack after. stent inserted due to blockage  . Paroxysmal atrial flutter ([place])    a. CHA2DS2VASc = 3 -> eliquis;  b. 07/2013 Echo: EF 50-55%, mildly dil LA.c. s/p RFCA 10/31/2013 by Dr Caryl Comes  . Prostate cancer ([place])   . Pulmonary nodule, right    a. 0.7cm RLL - stable by CT 06/2013.  [name] Rectal cancer ([place])    a. Status post colostomy  . Seizures ([place])    a. last 30 years ago  . Shingles    a. 09/2012.  . SSS (sick sinus syndrome) ([place])    a. 12/2009 S/P MDT Adapta ADDR01 DC PPM, ser # [id number] ([name]).  . Syncope and collapse      Patient Active Problem List   Diagnosis Date Noted  . Primary localized osteoarthritis of right knee 01/14/2016  . Essential hypertension   . Hyperlipidemia   . SSS (sick sinus syndrome) ([place])   . Unstable angina ([place]) 11/03/2014  . NSTEMI (non-ST elevated myocardial infarction) ([place])   . Effort angina ([place]) 11/02/2014  . S/P PTCA (percutaneous transluminal coronary angioplasty) 11/02/2014  . Coronary artery disease involving native coronary artery   . Prostate cancer ([place])   . Anxiety and depression 06/18/2014  . Fatigue 11/14/2013  . Typical atrial flutter ([place]) 10/31/2013  . Bilateral claudication of lower limb ([place])   . Coronary artery  disease   . Paroxysmal atrial flutter ([place])   . Hypertension   . Carotid stenosis   . COPD, severe GOLD GRADE D 03/28/2013  . Solitary pulmonary nodule 03/28/2013     Past Surgical History:  Procedure Laterality Date  . ABLATION  10/31/2013   CTI ablation by Dr Caryl Comes for atrial flutter  . ATRIAL FLUTTER ABLATION N/A 10/31/2013   Procedure: ATRIAL FLUTTER ABLATION;  Surgeon: [name], MD;  Location: [facility] CATH LAB;  Service: Cardiovascular;  Laterality: N/A;  . CARDIAC CATHETERIZATION  [facility]   [facility]  . CARDIAC CAT[facility]TERIZATION  01/2013   [facility]  . CARDIAC CATHETERIZATION  03/2014   [facility]  . CARDIAC CATHETERIZATION N/A 11/02/2014    Procedure: Left Heart Cath and Cors/Grafts Angiography;  Surgeon: [name], MD;  Location: [place] CV LAB;  Service: Cardiovascular;  Laterality: N/A;  . CARDIAC CATHETERIZATION N/A 11/02/2014   Procedure: Coronary Stent Intervention;  Surgeon: [name], MD;  Location: [place] CV LAB;  Service: Cardiovascular;  Laterality: N/A;  . CARDIAC CATHETERIZATION N/A 11/03/2014   Procedure: Left Heart Cath and Coronary Angiography;  Surgeon: [name], MD;  Location: [place] CV LAB;  Service: Cardiovascular;  Laterality: N/A;  . CARDIAC SURGERY  2011   Stents placed   . CATARACT EXTRACTION Left   . CATARACT EXTRACTION W/PHACO Left 06/01/2014   Procedure: CATARACT EXTRACTION PHACO AND INTRAOCULAR LENS PLACEMENT (IOC);  Surgeon: [name], MD;  Location: [name] ORS;  Service: Ophthalmology;  Laterality: Left;  . cataract surgery  2012  . colon surgery     cancer removal  . COLON SURGERY     colostomy  . COLONOSCOPY WITH PROPOFOL N/A 05/24/2016   Procedure: COLONOSCOPY WITH PROPOFOL;  Surgeon: [name], MD;  Location[facility] ENDOSCOPY;  Service: Endoscopy;  Laterality: N/A;  . CORONARY ANGIOPLASTY    . INSERT / REPLACE / REMOVE PACEMAKER  2011   just a pacer  . KNEE ARTHROSCOPY Right 1980's  . PACEMAKER PLACEMENT  01/17/2010  . TOTAL KNEE ARTHROPLASTY Right 01/14/2016   Procedure: TOTAL KNEE ARTHROPLASTY;  Surgeon: [name], MD;  Location: [name] ORS;  Service: Orthopedics;  Laterality: Right;     Prior to Admission medications   Medication Sig Start Date End Date Taking? Authorizing Provider  albuterol (PROAIR HFA) 108 (90 Base) MCG/ACT inhaler Inhale 2 puffs into the lungs every 4 (four) hours as needed for wheezing or shortness of breath. 07/11/16   Flora Lipps, MD  albuterol (PROVENTIL) (2.5 MG/3ML) 0.083% nebulizer solution Take 3 mLs (2.5 mg total) by nebulization every 6 (six) hours as needed for wheezing or shortness of breath. DX:J44.9  10/04/16   Flora Lipps, MD  amLODipine (NORVASC) 2.5 MG tablet Take 2.5 mg by mouth daily. 11/27/15   [provider]  arformoterol (BROVANA) 15 MCG/2ML NEBU Take 2 mLs (15 mcg total) by nebulization 2 (two) times daily. Take 2 mLs (15 mcg total) by nebulization 2 (two) times daily. DX code 493.20 Every morning and 1800 07/11/16   Flora Lipps, MD  aspirin EC 81 MG tablet Take 81 mg by mouth daily.    [provider]  atorvastatin (LIPITOR) 80 MG tablet Take 1 tablet (80 mg total) by mouth daily. 05/09/16 08/07/16  Wende Bushy, MD  azithromycin (ZITHROMAX) 250 MG tablet Take 1 tablet (250 mg total) by mouth daily. 07/11/16   Flora Lipps, MD  budesonide (PULMICORT) 0.5 MG/2ML nebulizer solution Take 2 mLs (0.5 mg total) by  nebulization 2 (two) times daily. DX code 493.20 Every morning and 1800 07/11/16   Flora Lipps, MD  Cholecalciferol (VITAMIN D) 2000 UNITS tablet Take 2,000 Units by mouth daily.    [provider]  degarelix (FIRMAGON) 120 MG injection Inject into the skin every 3 (three) months.     [provider]  isosorbide mononitrate (IMDUR) 120 MG 24 hr tablet Take one tablet (120 mg) by mouth at bedtime 03/28/16   [name], MD  levofloxacin (LEVAQUIN) 750 MG tablet Take 1 tablet (750 mg total) by mouth daily. 09/07/16   Frederich Cha, MD  losartan (COZAAR) 25 MG tablet Take 25 mg by mouth daily.    [provider]  metoprolol tartrate (LOPRESSOR) 25 MG tablet TAKE ONE TABLET BY MOUTH IN THE MORNING AND TAKE TWO TABLETS BY MOUTHIN THE EVENING. 01/24/17   [name], MD  nitroGLYCERIN (NITROSTAT) 0.4 MG SL tablet Place 1 tablet (0.4 mg total) under the tongue every 5 (five) minutes as needed for chest pain. 05/09/16   Wende Bushy, MD  nystatin cream (MYCOSTATIN) Apply 1 application topically 2 (two) times daily as needed. Rash/skin irritation. 09/17/15   [provider]  PARoxetine (PAXIL) 10 MG tablet Take 10 mg by mouth daily.  12/06/15   [provider]  predniSONE (DELTASONE) 10 MG tablet Take 5 tablets (50 mg total) by mouth daily with breakfast. X 10 days 07/11/16   Flora Lipps, MD  predniSONE (STERAPRED UNI-PAK 21 TAB) 10 MG (21) TBPK tablet Sig 6 tablet day 1, 5 tablets day 2, 4 tablets day 3,,3tablets day 4, 2 tablets day 5, 1 tablet day 6 take all tablets orally 09/07/16   Frederich Cha, MD  ranolazine (RANEXA) 500 MG 12 hr tablet Take 500 mg by mouth 2 (two) times daily.    [provider]  Tiotropium Bromide Monohydrate (SPIRIVA RESPIMAT) 1.25 MCG/ACT AERS Inhale 2 puffs into the lungs 2 (two) times daily. 07/11/16   Flora Lipps, MD  traMADol (ULTRAM) 50 MG tablet Take 1-2 tablets (50-100 mg total) by mouth every 4 (four) hours as needed for moderate pain. 01/16/16   Duanne Guess, PA-C     Allergies Patient has no known allergies.   Family History  Problem Relation Age of Onset  . Cancer Mother        'male cancer"  . Hypertension Mother   . Hyperlipidemia Mother   . Heart disease Father   . Heart attack Father   . Hypertension Father   . Hyperlipidemia Father     Social History Social History   Tobacco Use  . Smoking status: Former Smoker    Packs/day: 1.00    Years: 30.00    Pack years: 30.00    Types: Cigarettes    Last attempt to quit: 07/26/2008    Years since quitting: 8.5  . Smokeless tobacco: Never Used  Substance Use Topics  . Alcohol use: No  . Drug use: No    Review of Systems  Constitutional:   No fever or chills.  ENT:   No sore throat. No rhinorrhea. Cardiovascular:   Positive as above for chest pain without palpitations or syncope. Respiratory:  Positive shortness of breath without cough. Gastrointestinal:   Negative for abdominal pain, vomiting and diarrhea.  Musculoskeletal:   Negative for focal pain or swelling All other systems reviewed and are negative except as documented above in ROS and  HPI.  ____________________________________________   PHYSICAL EXAM:  VITAL SIGNS: ED  Triage Vitals  Enc Vitals Group     BP 02/07/17 1655 (!) 152/76     Pulse Rate 02/07/17 1655 65     Resp 02/07/17 1655 19     Temp 02/07/17 1655 98.2 F (36.8 C)     Temp Source 02/07/17 1655 Oral     SpO2 02/07/17 1655 97 %     Weight 02/07/17 1653 220 lb (99.8 kg)     Height 02/07/17 1653 5\' 9"  (1.753 m)     Head Circumference --      Peak Flow --      Pain Score 02/07/17 1653 5     Pain Loc --      Pain Edu? --      Excl. in [place]? --     Vital signs reviewed, nursing assessments reviewed.   Constitutional:   Alert and oriented. Well appearing and in no distress. Eyes:   No scleral icterus.  EOMI. No nystagmus. No conjunctival pallor. PERRL. ENT   Head:   Normocephalic and atraumatic.   Nose:   No congestion/rhinnorhea.    Mouth/Throat:   MMM, no pharyngeal erythema. No peritonsillar mass.    Neck:   No meningismus. Full ROM. Hematological/Lymphatic/Immunilogical:   No cervical lymphadenopathy. Cardiovascular:   RRR. Symmetric bilateral radial and DP pulses.  No murmurs.  Respiratory:   Normal respiratory effort without tachypnea/retractions. Breath sounds are clear and equal bilaterally. No wheezes/rales/rhonchi. Gastrointestinal:   Soft and nontender. Non distended. There is no CVA tenderness.  No rebound, rigidity, or guarding. Genitourinary:   deferred Musculoskeletal:   Normal range of motion in all extremities. No joint effusions.  No lower extremity tenderness.  No edema. Neurologic:   Normal speech and language.  Motor grossly intact. No gross focal neurologic deficits are appreciated.  Skin:    Skin is warm, dry and intact. No rash noted.  No petechiae, purpura, or bullae.  ____________________________________________    LABS (pertinent positives/negatives) (all labs ordered are listed, but only abnormal results are displayed) Labs Reviewed  CBC  BASIC  METABOLIC PANEL  TROPONIN I  PROTIME-INR  APTT   ____________________________________________   EKG  Interpreted by me Ventricular paced rhythm, rate of 65, left axis, left bundle branch block. No acute ischemic changes.  ----------------------------------------- 5:33 PM on 02/07/2017 -----------------------------------------  Repeat EKG at 5:30 PM unchanged. Ventricular paced, rate of 65, left axis, left bundle branch block. There are no dynamic changes.  ____________________________________________    RADIOLOGY  No results found.  ____________________________________________   PROCEDURES Procedures CRITICAL CARE Performed by: Joni Fears, Seriyah Collison   Total critical care time: 35 minutes  Critical care time was exclusive of separately billable procedures and treating other patients.  Critical care was necessary to treat or prevent imminent or life-threatening deterioration.  Critical care was time spent personally by me on the following activities: development of treatment plan with patient and/or surrogate as well as nursing, discussions with consultants, evaluation of patient's response to treatment, examination of patient, obtaining history from patient or surrogate, ordering and performing treatments and interventions, ordering and review of laboratory studies, ordering and review of radiographic studies, pulse oximetry and re-evaluation of patient's condition.  ____________________________________________    CLINICAL IMPRESSION / ASSESSMENT AND PLAN / ED COURSE  Pertinent labs & imaging results that were available during my care of the patient were reviewed by me and considered in my medical decision making (see chart for details).   Patient presents with precordial pain concerning for cardiac etiology. He  is having worsening exertional symptoms over the past several weeks, and today his symptoms are improved with nitroglycerin but recurrent as it starts to wear  off. Presentation is concerning for unstable angina. All started a nitroglycerin drip and heparin drip for cardiac protection and plan to admit the patient for further cardiology evaluation. Low suspicion of PE or dissection or pericardial effusion.  Clinical Course as of Feb 08 1731  Wed Feb 07, 2017  1729 called to bedside, patient aggravated and shouting that we weren't doing anything to help him. Nurses at our to started nitroglycerin infusion. Continuing to titrate. Blood pressure elevated at about 180/100, nitroglycerin increase to 30 mics per minute. We'll continue to monitor closely.  [PS]    Clinical Course User Index [PS] Carrie Mew, MD    ----------------------------------------- 5:48 PM on 02/07/2017 -----------------------------------------  Discussed with Dr. Nehemiah Massed who will facilitate cardiology evaluation. Discussed with the hospitalist for further management. Chest pain decreasing on nitroglycerin titration.   ____________________________________________   FINAL CLINICAL IMPRESSION(S) / ED DIAGNOSES    Final diagnoses:  Unstable angina ([place])       Portions of this note were generated with dragon dictation software. Dictation errors may occur despite best attempts at proofreading.    Carrie Mew, MD 02/07/17 [phone]

## 2017-02-07 NOTE — Consult Note (Signed)
ANTICOAGULATION CONSULT NOTE - Initial Consult  Pharmacy Consult for Heparin Drip Dosing and Monitoring   Indication: chest pain/ACS  No Known Allergies  Patient Measurements: Height: 5\' 9"  (175.3 cm) Weight: 220 lb (99.8 kg) IBW/kg (Calculated) : 70.7 Heparin Dosing Weight: 82kg   Vital Signs: Temp: 98.2 F (36.8 C) (01/09 1655) Temp Source: Oral (01/09 1655) BP: 198/132 (01/09 1733) Pulse Rate: 65 (01/09 1733)  Labs: Recent Labs    02/07/17 1655  HGB 15.8  HCT 46.6  PLT 202  CREATININE 1.08  TROPONINI <0.03    Estimated Creatinine Clearance: 72 mL/min (by C-G formula based on SCr of 1.08 mg/dL).   Medical History: Past Medical History:  Diagnosis Date  . Anginal pain ([place])    feels this as jaw pain.  takes imdur for this. rarely uses nitro.  [name] Anxiety   . Arthritis   . Asthma   . Bilateral claudication of lower limb ([place])   . Carotid stenosis    a. 07/2013 Carotid U/S: 100% RICA (pt prev reported h/o 16%), LICA 10-96%, bilat ECA 50%, normal vertebrals and subclavians bilat-->F/U needed in 01/2014.  . Cataract of left eye   . Cataract, right    a. 10/2010 s/p cataract surgery   . COPD (chronic obstructive pulmonary disease) ([place])   . Coronary artery disease    a. 06/1996 & 03/2005 Cath: non-obs dzs;  b. 07/2009 NSTEMI/Cath: LCX 99->PCI/DES extending into OM1;  c. 01/2013 Cath: patent LCX stent-->Med Rx.; d. cath 03/2014: RPAV dzs->Med rx; e. 10/2014 Cath/PCI: LM 30, LAD 20p/m, LCX 50ost/p, 10 ISR, 60d, OM1 min irregs, OM2 50, RCA 30ost, 59m, 60d, RPDA 70(2.5x8 Xience DES), RPAV 90small(PTCA); f. 10/2014 Relook Cath: RPAV 99(Med Rx), EF 45-50.  [name] Dysrhythmia    since ablation, flutter resolved.  . Essential hypertension   . Hyperkalemia   . Hyperlipidemia   . Myocardial infarction ([place]) 2015   had stent inserted and then had heart attack after. stent inserted due to blockage  . Paroxysmal atrial flutter ([place])    a. CHA2DS2VASc = 3 -> eliquis;  b. 07/2013 Echo: EF  50-55%, mildly dil LA.c. s/p RFCA 10/31/2013 by Dr Caryl Comes  . Prostate cancer ([place])   . Pulmonary nodule, right    a. 0.7cm RLL - stable by CT 06/2013.  [name] Rectal cancer ([place])    a. Status post colostomy  . Seizures ([place])    a. last 30 years ago  . Shingles    a. 09/2012.  . SSS (sick sinus syndrome) ([place])    a. 12/2009 S/P MDT Adapta ADDR01 DC PPM, ser # [id number] ([name]).  . Syncope and collapse     Assessment: Pharmacy consulted for heparin drip dosing and monitoring for 72yo male with ACS/Chest Pain  Goal of Therapy:  Heparin level 0.3-0.7 units/ml Monitor platelets by anticoagulation protocol: Yes   Plan:  Baseline labs ordered Dosing weight 82kg  Give 4000 units bolus x 1 Start heparin infusion at 1000 units/hr Check anti-Xa level in 8 hours and daily while on heparin Continue to monitor H&H and platelets  Pernell Dupre, PharmD, BCPS Clinical Pharmacist 02/07/2017 5:37 PM

## 2017-02-07 NOTE — ED Triage Notes (Addendum)
Pt presents vai [facility] from home for chest pain. Albuterol 2.5 mg given in transit. Pt has a pacemaker that was placed here in 2014. Pt is A/o and states pain is in the jaw and then moves to chest, with pain in the left arm. [facility] states pt reports he took 2 nitro stopped then 20-30 ,pain started hurting again and pt took 2 more nitro.[facility] gave 324 mg of Asprin in transit Pt denies headache n/v/d or fever. Pt is A/ox4 Awaiting EDP.

## 2017-02-07 NOTE — Progress Notes (Signed)
eLink Physician-Brief Progress Note Patient Name: RION CATALA DOB: 05/05/1945 MRN: [id number]   Date of Service  02/07/2017  HPI/Events of Note  Unstable angina Admit to stepdown unit Nitroglycerin drip and heparin drip Will get echocardiogram    eICU Interventions  STEP DOWN STATUS FOLLOW UP CARDIOLOGY RECS     Intervention Category Evaluation Type: New Patient Evaluation  Flora Lipps 02/07/2017, 7:44 PM

## 2017-02-08 ENCOUNTER — Encounter
Admission: EM | Disposition: A | Discharge status: Other Acute Inpatient Hospital | Payer: Self-pay | Source: Home / Self Care | Attending: Internal Medicine

## 2017-02-08 ENCOUNTER — Inpatient Hospital Stay
Admit: 2017-02-08 | Discharge: 2017-02-08 | Disposition: A | Discharge status: Home / Self Care | Payer: Medicare Other | Attending: Internal Medicine | Admitting: Internal Medicine

## 2017-02-08 DIAGNOSIS — I200 Unstable angina: Secondary | ICD-10-CM

## 2017-02-08 HISTORY — PX: LEFT HEART CATH AND CORONARY ANGIOGRAPHY: CATH118249

## 2017-02-08 LAB — CBC
HCT: 44.3 % (ref 40.0–52.0)
Hemoglobin: 14.9 g/dL (ref 13.0–18.0)
MCH: 32.2 pg (ref 26.0–34.0)
MCHC: 33.6 g/dL (ref 32.0–36.0)
MCV: 95.9 fL (ref 80.0–100.0)
Platelets: 189 10*3/uL (ref 150–440)
RBC: 4.62 MIL/uL (ref 4.40–5.90)
RDW: 14.2 % (ref 11.5–14.5)
WBC: 8.6 10*3/uL (ref 3.8–10.6)

## 2017-02-08 LAB — ECHOCARDIOGRAM COMPLETE
HEIGHTINCHES: 67 in
Weight: 3516.78 oz

## 2017-02-08 LAB — HEPARIN LEVEL (UNFRACTIONATED): HEPARIN UNFRACTIONATED: 0.39 [IU]/mL (ref 0.30–0.70)

## 2017-02-08 LAB — TROPONIN I

## 2017-02-08 SURGERY — LEFT HEART CATH AND CORONARY ANGIOGRAPHY
Anesthesia: Moderate Sedation

## 2017-02-08 MED ORDER — NITROGLYCERIN 2 % TD OINT
1.0000 [in_us] | TOPICAL_OINTMENT | Freq: Four times a day (QID) | TRANSDERMAL | Status: DC
  Administered 2017-02-08 – 2017-02-09 (×6): 1 [in_us] via TOPICAL
  Filled 2017-02-08 (×4): qty 1

## 2017-02-08 MED ORDER — NITROGLYCERIN 2 % TD OINT
TOPICAL_OINTMENT | TRANSDERMAL | Status: AC
  Administered 2017-02-08: 1 [in_us] via TOPICAL
  Filled 2017-02-08: qty 1

## 2017-02-08 MED ORDER — IPRATROPIUM-ALBUTEROL 0.5-2.5 (3) MG/3ML IN SOLN
RESPIRATORY_TRACT | Status: AC
  Filled 2017-02-08: qty 3

## 2017-02-08 MED ORDER — MIDAZOLAM HCL 2 MG/2ML IJ SOLN
INTRAMUSCULAR | Status: AC
  Filled 2017-02-08: qty 2

## 2017-02-08 MED ORDER — SODIUM CHLORIDE 0.9% FLUSH: 3.0000 mL | INTRAVENOUS | Status: DC | PRN

## 2017-02-08 MED ORDER — SODIUM CHLORIDE 0.9 % WEIGHT BASED INFUSION: 1.0000 mL/kg/h | INTRAVENOUS | Status: DC

## 2017-02-08 MED ORDER — HEPARIN (PORCINE) IN NACL 2-0.9 UNIT/ML-% IJ SOLN
INTRAMUSCULAR | Status: AC
  Filled 2017-02-08: qty 500

## 2017-02-08 MED ORDER — SODIUM CHLORIDE 0.9 % IV SOLN: 250.0000 mL | INTRAVENOUS | Status: DC | PRN

## 2017-02-08 MED ORDER — ASPIRIN 81 MG PO CHEW: 81.0000 mg | CHEWABLE_TABLET | ORAL | Status: DC

## 2017-02-08 MED ORDER — FENTANYL CITRATE (PF) 100 MCG/2ML IJ SOLN
INTRAMUSCULAR | Status: AC
  Filled 2017-02-08: qty 2

## 2017-02-08 MED ORDER — FENTANYL CITRATE (PF) 100 MCG/2ML IJ SOLN
INTRAMUSCULAR | Status: DC | PRN
  Administered 2017-02-08: 25 ug via INTRAVENOUS

## 2017-02-08 MED ORDER — IOPAMIDOL (ISOVUE-300) INJECTION 61%
INTRAVENOUS | Status: DC | PRN
  Administered 2017-02-08: 180 mL via INTRA_ARTERIAL

## 2017-02-08 MED ORDER — BUDESONIDE 0.5 MG/2ML IN SUSP
0.5000 mg | Freq: Two times a day (BID) | RESPIRATORY_TRACT | Status: DC
  Administered 2017-02-08 – 2017-02-09 (×3): 0.5 mg via RESPIRATORY_TRACT
  Filled 2017-02-08 (×3): qty 2

## 2017-02-08 MED ORDER — ALBUTEROL SULFATE (2.5 MG/3ML) 0.083% IN NEBU
2.5000 mg | INHALATION_SOLUTION | RESPIRATORY_TRACT | Status: DC | PRN
  Administered 2017-02-08 – 2017-02-09 (×6): 2.5 mg via RESPIRATORY_TRACT
  Filled 2017-02-08 (×5): qty 3

## 2017-02-08 MED ORDER — MIDAZOLAM HCL 2 MG/2ML IJ SOLN
INTRAMUSCULAR | Status: DC | PRN
  Administered 2017-02-08: 1 mg via INTRAVENOUS

## 2017-02-08 MED ORDER — SODIUM CHLORIDE 0.9% FLUSH
3.0000 mL | Freq: Two times a day (BID) | INTRAVENOUS | Status: DC
  Administered 2017-02-08: 3 mL via INTRAVENOUS

## 2017-02-08 MED ORDER — SODIUM CHLORIDE 0.9 % WEIGHT BASED INFUSION
3.0000 mL/kg/h | INTRAVENOUS | Status: DC
  Administered 2017-02-08: 3 mL/kg/h via INTRAVENOUS

## 2017-02-08 SURGICAL SUPPLY — 8 items
CATH INFINITI 5FR ANG PIGTAIL (CATHETERS) ×3 IMPLANT
CATH INFINITI 5FR JL4 (CATHETERS) ×3 IMPLANT
CATH INFINITI JR4 5F (CATHETERS) ×3 IMPLANT
KIT MANI 3VAL PERCEP (MISCELLANEOUS) ×3 IMPLANT
NEEDLE PERC 18GX7CM (NEEDLE) ×3 IMPLANT
PACK CARDIAC CATH (CUSTOM PROCEDURE TRAY) ×3 IMPLANT
SHEATH PINNACLE 5F 10CM (SHEATH) ×3 IMPLANT
WIRE EMERALD 3MM-J .035X150CM (WIRE) ×3 IMPLANT

## 2017-02-08 NOTE — Discharge Summary (Signed)
Donald Juarez, is a 72 y.o. male  DOB 1945-05-15  MRN [id number].  Admission date:  02/07/2017  Admitting Physician  Nicholes Mango, MD  Discharge Date:  02/08/2017   Primary MD  Sofie Hartigan, MD  Recommendations for primary care physician for things to follow:   Patient will be transferred to tertiary center for CABG therePatient will be transferred to tertiary center for CABG.   Admission Diagnosis  Unstable angina (HCC) [I20.0] Chest pain [R07.9]   Discharge Diagnosis  Unstable angina ([place]) [I20.0] Chest pain [R07.9]    Active Problems:   Unstable angina [name])      Past Medical History:  Diagnosis Date  . Anginal pain ([name])    feels this as jaw pain.  takes imdur for this. rarely uses nitro.  [name] Anxiety   . Arthritis   . Asthma   . Bilateral claudication of lower limb ([name])   . Carotid stenosis    a. 07/2013 Carotid U/S: 100% RICA (pt prev reported h/o 29%), LICA 47-65%, bilat ECA 50%, normal vertebrals and subclavians bilat-->F/U needed in 01/2014.  . Cataract of left eye   . Cataract, right    a. 10/2010 s/p cataract surgery   . COPD (chronic obstructive pulmonary disease) ([name])   . Coronary artery disease    a. 06/1996 & 03/2005 Cath: non-obs dzs;  b. 07/2009 NSTEMI/Cath: LCX 99->PCI/DES extending into OM1;  c. 01/2013 Cath: patent LCX stent-->Med Rx.; d. cath 03/2014: RPAV dzs->Med rx; e. 10/2014 Cath/PCI: LM 30, LAD 20p/m, LCX 50ost/p, 10 ISR, 60d, OM1 min irregs, OM2 50, RCA 30ost, 77m, 60d, RPDA 70(2.5x8 Xience DES), RPAV 90small(PTCA); f. 10/2014 Relook Cath: RPAV 99(Med Rx), EF 45-50.  [name] Dysrhythmia    since ablation, flutter resolved.  . Essential hypertension   . Hyperkalemia   . Hyperlipidemia   . Myocardial infarction ([name]) 2015   had stent inserted and then had heart attack after. stent inserted due  to blockage  . Paroxysmal atrial flutter ([name])    a. CHA2DS2VASc = 3 -> eliquis;  b. 07/2013 Echo: EF 50-55%, mildly dil LA.c. s/p RFCA 10/31/2013 by Dr Caryl Comes  . Prostate cancer ([name])   . Pulmonary nodule, right    a. 0.7cm RLL - stable by CT 06/2013.  [name] Rectal cancer ([name])    a. Status post colostomy  . Seizures ([name])    a. last 30 years ago  . Shingles    a. 09/2012.  . SSS (sick sinus syndrome) ([name])    a. 12/2009 S/P MDT [place] ADDR01 DC PPM, ser # [id number] ([name]).  . Syncope and collapse     Past Surgical History:  Procedure Laterality Date  . ABLATION  10/31/2013   CTI ablation by Dr Caryl Comes for atrial flutter  . ATRIAL FLUTTER ABLATION N/A 10/31/2013   Procedure: ATRIAL FLUTTER ABLATION;  Surgeon: [name], MD;  Location[name];  Service: Cardiovascular;  Laterality: N/A;  . CARDIAC CATHETERIZATION  07/2009   [facility]  . CARDIAC CATHETERIZATION  01/2013   [facility]  . CARDIAC CATHETERIZATION  03/2014   [facility]  . CARDIAC CATHETERIZATION N/A 11/02/2014   Procedure: Left Heart Cath and Cors/Grafts Angiography;  Surgeon: [name], MD;  Location: [name];  Service: Cardiovascular;  Laterality: N/A;  . CARDIAC CATHETERIZATION N/A 11/02/2014   Procedure: Coronary Stent Intervention;  Surgeon: [name], MD;  Location: [name];  Service: Cardiovascular;  Laterality: N/A;  . CARDIAC CATHETERIZATION  N/A 11/03/2014   Procedure: Left Heart Cath and Coronary Angiography;  Surgeon: [name], MD;  Location: [name];  Service: Cardiovascular;  Laterality: N/A;  . CARDIAC SURGERY  2011   Stents placed   . CATARACT EXTRACTION Left   . CATARACT EXTRACTION W/PHACO Left 06/01/2014   Procedure: CATARACT EXTRACTION PHACO AND INTRAOCULAR LENS PLACEMENT (IOC);  Surgeon: [name], MD;  Location: [name] ORS;  Service: Ophthalmology;  Laterality: Left;  . cataract surgery  2012  . colon surgery     cancer removal  . COLON SURGERY      colostomy  . COLONOSCOPY WITH PROPOFOL N/A 05/24/2016   Procedure: COLONOSCOPY WITH PROPOFOL;  Surgeon: [name], MD;  Location[facility] ENDOSCOPY;  Service: Endoscopy;  Laterality: N/A;  . CORONARY ANGIOPLASTY    . INSERT / REPLACE / REMOVE PACEMAKER  2011   just a pacer  . KNEE ARTHROSCOPY Right 1980's  . PACEMAKER PLACEMENT  01/17/2010  . TOTAL KNEE ARTHROPLASTY Right 01/14/2016   Procedure: TOTAL KNEE ARTHROPLASTY;  Surgeon: [name], MD;  Location: [name] ORS;  Service: Orthopedics;  Laterality: Right;       History of present illness and  Hospital Course:     Kindly see H&P for history of present illness and admission details, please review complete Labs, Consult reports and Test reports for all details in brief  HPI  from the history and physical done on the day of admission  72 year old male patient with history of CAD status post 2 stents in the past, essential hypertension, hyperlipidemia, COPD coming because of recurrent crushing chest pain70 year old male patient with history of CAD status post 2 stents in the past, essential hypertension, hyperlipidemia, COPD, and because of the current crushing chest pain that started started yesterday afternoon while doing groseryshopping.  Patient took sublingual nitro and went home, and went home, after she completed a, patient had recurrent chest pain refused to come to ED, patient had recurrent chest pain and came to you today.  and came to ED. Initial troponins are negative, EKG did not show acute ST changes.  Admitted to ICU and started on nitro drip     Hospital Course   Chest pain with unstable angina; admitted to ICU, started on nitro drip, heparin drip, admitted to ICU, started on nitro drip, heparin drip, blood pressure  is elevated and he came is elevateup to 200/125.    Initial troponin is negative , EKG with no acute ST-T wave changes but the patient was having crushing chest heaviness radiating to the left jaw and  shoulder associated with diaphoresis ,.chest pain improved with nitro drip. No evidence of MI with normal troponins. No more chest pain after nitroglycerin drip.  No evidence of microinfarction with normal troponin. Cardiac catheterization showing mild to moderate apical LV systolic dysfunction with ejection fraction of 35-40% Left main stenosis 45%, LAD stenosis 80% ostial circumflex stenosis 85% mid right coronary artery stenosis 90% significant three-vessel coronary artery disease after [place] class IV unstable angina out evidence of myocardial infarction,Further intervention including transfer for coronary artery bypass grafting for critical three-vessel coronary artery disease,Continue antiplatelet medication management with aspirin 2.  Beta-blocker ACE inhibitor Aldactone for LV systolic dysfunction and previous myocardial infarction 3.  Isosorbide and/or nitrates Pt agreeable for CABG  2.COPD No exacerbation at this time Albuterol as needed and continue Spiriva   3.Anxiety;continue Paxil.    Discharge Instructions  and  Discharge Medications      Allergies as  of 02/08/2017   No Known Allergies     Medication List    STOP taking these medications   levofloxacin 750 MG tablet Commonly known as:  LEVAQUIN   predniSONE 10 MG (21) Tbpk tablet Commonly known as:  STERAPRED UNI-PAK 21 TAB   predniSONE 10 MG tablet Commonly known as:  DELTASONE     TAKE these medications   albuterol 108 (90 Base) MCG/ACT inhaler Commonly known as:  PROAIR HFA Inhale 2 puffs into the lungs every 4 (four) hours as needed for wheezing or shortness of breath.   albuterol (2.5 MG/3ML) 0.083% nebulizer solution Commonly known as:  PROVENTIL Take 3 mLs (2.5 mg total) by nebulization every 6 (six) hours as needed for wheezing or shortness of breath. DX:J44.9   arformoterol 15 MCG/2ML Nebu Commonly known as:  BROVANA Take 2 mLs (15 mcg total) by nebulization 2 (two) times daily. Take 2 mLs  (15 mcg total) by nebulization 2 (two) times daily. DX code 493.20 Every morning and 1800   aspirin EC 81 MG tablet Take 81 mg by mouth daily.   atorvastatin 80 MG tablet Commonly known as:  LIPITOR Take 1 tablet (80 mg total) by mouth daily.   budesonide 0.5 MG/2ML nebulizer solution Commonly known as:  PULMICORT Take 2 mLs (0.5 mg total) by nebulization 2 (two) times daily. DX code 493.20 Every morning and 1800   budesonide-formoterol 160-4.5 MCG/ACT inhaler Commonly known as:  SYMBICORT Inhale 2 puffs into the lungs 2 (two) times daily.   isosorbide mononitrate 120 MG 24 hr tablet Commonly known as:  IMDUR Take one tablet (120 mg) by mouth at bedtime What changed:    how much to take  how to take this  when to take this  additional instructions   losartan 25 MG tablet Commonly known as:  COZAAR Take 25 mg by mouth daily.   meclizine 12.5 MG tablet Commonly known as:  ANTIVERT Take 1-2 tablets by mouth 3 (three) times daily as needed.   metoprolol tartrate 25 MG tablet Commonly known as:  LOPRESSOR TAKE ONE TABLET BY MOUTH IN THE MORNING AND TAKE TWO TABLETS BY MOUTHIN THE EVENING. What changed:    how much to take  how to take this  when to take this  additional instructions   nitroGLYCERIN 0.4 MG SL tablet Commonly known as:  NITROSTAT Place 1 tablet (0.4 mg total) under the tongue every 5 (five) minutes as needed for chest pain.   omeprazole 40 MG capsule Commonly known as:  PRILOSEC Take 40 mg by mouth daily.   PARoxetine 30 MG tablet Commonly known as:  PAXIL Take 30 mg by mouth daily.   ranolazine 500 MG 12 hr tablet Commonly known as:  RANEXA Take 1,000 mg by mouth 2 (two) times daily.   Tiotropium Bromide Monohydrate 1.25 MCG/ACT Aers Commonly known as:  SPIRIVA RESPIMAT Inhale 2 puffs into the lungs 2 (two) times daily.   traMADol 50 MG tablet Commonly known as:  ULTRAM Take 1-2 tablets (50-100 mg total) by mouth every 4 (four)  hours as needed for moderate pain. What changed:    how much to take  when to take this   Vitamin D 2000 units tablet Take 2,000 Units by mouth daily.         Diet and Activity recommendation: See Discharge Instructions above   Consults obtained - Cardiology   Major procedures and Radiology Reports - PLEASE review detailed and final reports for all details, in brief -  Dg Chest Port 1 View  Result Date: 02/07/2017 CLINICAL DATA:  Left-sided chest pain. EXAM: PORTABLE CHEST 1 VIEW COMPARISON:  04/27/2016 FINDINGS: Dual lead cardiac pacemaker. Cardiomediastinal silhouette is normal. Mediastinal contours appear intact. Calcific atherosclerotic disease of the aorta. There is no evidence of focal airspace consolidation, pleural effusion or pneumothorax. Osseous structures are without acute abnormality. Soft tissues are grossly normal. IMPRESSION: Calcific atherosclerotic disease of the aorta. Otherwise no evidence of acute cardiopulmonary disease. Electronically Signed   By: Fidela Salisbury M.D.   On: 02/07/2017 18:31    Micro Results     Recent Results (from the past 240 hour(s))  MRSA PCR Screening     Status: None   Collection Time: 02/07/17  7:26 PM  Result Value Ref Range Status   MRSA by PCR NEGATIVE NEGATIVE Final    Comment:        The GeneXpert MRSA Assay (FDA approved for NASAL specimens only), is one component of a comprehensive MRSA colonization surveillance program. It is not intended to diagnose MRSA infection nor to guide or monitor treatment for MRSA infections. Performed at [facility], [address] [place], [place] [address]        Today   Subjective:   Chet Greenley today stable for transfer  Objective:   Blood pressure 105/75, pulse 65, temperature 97.6 F (36.4 C), temperature source Oral, resp. rate 12, height 5\' 7"  (1.702 m), weight 99.7 kg (219 lb 12.8 oz), SpO2 91 %.   Intake/Output Summary (Last 24 hours) at  02/08/2017 1438 Last data filed at 02/08/2017 0300 Gross per 24 hour  Intake 139.65 ml  Output 300 ml  Net -160.35 ml    Exam Awake Alert, Oriented x 3, No new F.N deficits, Normal affect [place].AT,PERRAL Supple Neck,No JVD, No cervical lymphadenopathy appriciated.  Symmetrical Chest wall movement, Good air movement bilaterally, CTAB RRR,No Gallops,Rubs or new Murmurs, No Parasternal Heave +ve B.Sounds, Abd Soft, Non tender, No organomegaly appriciated, No rebound -guarding or rigidity. No Cyanosis, Clubbing or edema, No new Rash or bruise  Data Review   CBC w Diff:  Lab Results  Component Value Date   WBC 8.6 02/08/2017   HGB 14.9 02/08/2017   HGB 14.4 10/26/2014   HCT 44.3 02/08/2017   HCT 42.6 10/26/2014   PLT 189 02/08/2017   PLT 181 10/26/2014   LYMPHOPCT 7.7 10/08/2013   MONOPCT 7.1 10/08/2013   EOSPCT 1.0 10/08/2013   BASOPCT 1.0 10/08/2013    CMP:  Lab Results  Component Value Date   NA 137 02/07/2017   NA 141 10/26/2014   NA 137 05/10/2014   K 4.6 02/07/2017   K 3.4 (L) 05/10/2014   CL 105 02/07/2017   CL 104 05/10/2014   CO2 25 02/07/2017   CO2 22 05/10/2014   BUN 30 (H) 02/07/2017   BUN 19 10/26/2014   BUN 20 05/10/2014   CREATININE 1.08 02/07/2017   CREATININE 1.15 05/10/2014   PROT 7.3 05/10/2014   ALBUMIN 3.9 05/10/2014   BILITOT 0.7 05/10/2014   ALKPHOS 67 05/10/2014   AST 41 05/10/2014   ALT 37 05/10/2014  .   Total Time in preparing paper work, data evaluation and todays exam - 35 minutes  Epifanio Lesches M.D on 02/08/2017 at 2:38 PM    Note: This dictation was prepared with Dragon dictation along with smaller phrase technology. Any transcriptional errors that result from this process are unintentional.

## 2017-02-08 NOTE — Progress Notes (Signed)
*  PRELIMINARY RESULTS* Echocardiogram 2D Echocardiogram has been performed.  [name] 02/08/2017, 9:32 AM

## 2017-02-08 NOTE — Consult Note (Signed)
[facility] Cardiology Consultation Note  Patient ID: Donald Juarez, MRN: [id number], DOB/AGE: [birth year] 72 y.o. Admit date: 02/07/2017   Date of Consult: 02/08/2017 Primary Physician: Sofie Hartigan, MD Primary Cardiologist: Call would  Chief Complaint:  Chief Complaint  Patient presents with  . Chest Pain   Reason for Consult: Pain  HPI: 72 y.o. male with known coronary artery disease as of his previous PCI and stent placement multiple arteries essential hypertension mixed hyperlipidemia on appropriate medication management who has done fairly well with this medication management and has had episodes of chest discomfort.  The patient has been on isosorbide and Ranexa for treatment of intermittent chest discomfort as well as metoprolol losartan for hypertension control and high intensity cholesterol therapy.  He had significant episode of left-sided chest discomfort worse and is ever felt before with no EKG changes and it was relieved only by nitroglycerin.  This happened 3-4 times prior to admission to the hospital for which she has had normal troponin.  He did have full relief with nitroglycerin intravenously and now is more comfortable.  Past Medical History:  Diagnosis Date  . Anginal pain ([place])    feels this as jaw pain.  takes imdur for this. rarely uses nitro.  [name] Anxiety   . Arthritis   . Asthma   . Bilateral claudication of lower limb ([place])   . Carotid stenosis    a. 07/2013 Carotid U/S: 100% RICA (pt prev reported h/o 38%), LICA 25-05%, bilat ECA 50%, normal vertebrals and subclavians bilat-->F/U needed in 01/2014.  . Cataract of left eye   . Cataract, right    a. 10/2010 s/p cataract surgery   . COPD (chronic obstructive pulmonary disease) ([place])   . Coronary artery disease    a. 06/1996 & 03/2005 Cath: non-obs dzs;  b. 07/2009 NSTEMI/Cath: LCX 99->PCI/DES extending into OM1;  c. 01/2013 Cath: patent LCX stent-->Med Rx.; d. cath 03/2014: RPAV dzs->Med rx; e. 10/2014 Cath/PCI:  LM 30, LAD 20p/m, LCX 50ost/p, 10 ISR, 60d, OM1 min irregs, OM2 50, RCA 30ost, 48m, 60d, RPDA 70(2.5x8 Xience DES), RPAV 90small(PTCA); f. 10/2014 Relook Cath: RPAV 99(Med Rx), EF 45-50.  [name] Dysrhythmia    since ablation, flutter resolved.  . Essential hypertension   . Hyperkalemia   . Hyperlipidemia   . Myocardial infarction ([place]) 2015   had stent inserted and then had heart attack after. stent inserted due to blockage  . Paroxysmal atrial flutter ([place])    a. CHA2DS2VASc = 3 -> eliquis;  b. 07/2013 Echo: EF 50-55%, mildly dil LA.c. s/p RFCA 10/31/2013 by Dr Caryl Comes  . Prostate cancer ([place])   . Pulmonary nodule, right    a. 0.7cm RLL - stable by CT 06/2013.  [name] Rectal cancer ([place])    a. Status post colostomy  . Seizures ([place])    a. last 30 years ago  . Shingles    a. 09/2012.  . SSS (sick sinus syndrome) ([place])    a. 12/2009 S/P MDT Adapta ADDR01 DC PPM, ser # [id number] ([name]).  . Syncope and collapse       Surgical History:  Past Surgical History:  Procedure Laterality Date  . ABLATION  10/31/2013   CTI ablation by Dr Caryl Comes for atrial flutter  . ATRIAL FLUTTER ABLATION N/A 10/31/2013   Procedure: ATRIAL FLUTTER ABLATION;  Surgeon: [name], MD;  Location[facility] CATH LAB;  Service: Cardiovascular;  Laterality: N/A;  . CARDIAC CATHETERIZATION  07/2009   [facility]  . CARDIAC  CATHETERIZATION  01/2013   [facility]  . CARDIAC CATHETERIZATION  03/2014   [facility]  . CARDIAC CATHETERIZATION N/A 11/02/2014   Procedure: Left Heart Cath and Cors/Grafts Angiography;  Surgeon: [name], MD;  Location: [facility] CV LAB;  Service: Cardiovascular;  Laterality: N/A;  . CARDIAC CATHETERIZATION N/A 11/02/2014   Procedure: Coronary Stent Intervention;  Surgeon: [name], MD;  Location: [facility] CV LAB;  Service: Cardiovascular;  Laterality: N/A;  . CARDIAC CATHETERIZATION N/A 11/03/2014   Procedure: Left Heart Cath and Coronary Angiography;  Surgeon: [name], MD;  Location: [facility] CV LAB;  Service: Cardiovascular;  Laterality: N/A;  . CARDIAC SURGERY  2011   Stents placed   . CATARACT EXTRACTION Left   . CATARACT EXTRACTION W/PHACO Left 06/01/2014   Procedure: CATARACT EXTRACTION PHACO AND INTRAOCULAR LENS PLACEMENT (IOC);  Surgeon: [name], MD;  Location: [name] ORS;  Service: Ophthalmology;  Laterality: Left;  . cataract surgery  2012  . colon surgery     cancer removal  . COLON SURGERY     colostomy  . COLONOSCOPY WITH PROPOFOL N/A 05/24/2016   Procedure: COLONOSCOPY WITH PROPOFOL;  Surgeon: [name], MD;  Location[facility] ENDOSCOPY;  Service: Endoscopy;  Laterality: N/A;  . CORONARY ANGIOPLASTY    . INSERT / REPLACE / REMOVE PACEMAKER  2011   just a pacer  . KNEE ARTHROSCOPY Right 1980's  . PACEMAKER PLACEMENT  01/17/2010  . TOTAL KNEE ARTHROPLASTY Right 01/14/2016   Procedure: TOTAL KNEE ARTHROPLASTY;  Surgeon: [name], MD;  Location: [name] ORS;  Service: Orthopedics;  Laterality: Right;     Home Meds: Prior to Admission medications   Medication Sig Start Date End Date Taking? Authorizing Provider  albuterol (PROAIR HFA) 108 (90 Base) MCG/ACT inhaler Inhale 2 puffs into the lungs every 4 (four) hours as needed for wheezing or shortness of breath. 07/11/16  Yes Flora Lipps, MD  albuterol (PROVENTIL) (2.5 MG/3ML) 0.083% nebulizer solution Take 3 mLs (2.5 mg total) by nebulization every 6 (six) hours as needed for wheezing or shortness of breath. DX:J44.9 10/04/16  Yes Flora Lipps, MD  arformoterol (BROVANA) 15 MCG/2ML NEBU Take 2 mLs (15 mcg total) by nebulization 2 (two) times daily. Take 2 mLs (15 mcg total) by nebulization 2 (two) times daily. DX code 493.20 Every morning and 1800 07/11/16  Yes Flora Lipps, MD  aspirin EC 81 MG tablet Take 81 mg by mouth daily.   Yes [provider]  atorvastatin (LIPITOR) 80 MG tablet Take 1 tablet (80 mg total) by mouth daily. 05/09/16 02/07/17 Yes Wende Bushy, MD  budesonide (PULMICORT)  0.5 MG/2ML nebulizer solution Take 2 mLs (0.5 mg total) by nebulization 2 (two) times daily. DX code 493.20 Every morning and 1800 07/11/16  Yes Kasa, Maretta Bees, MD  budesonide-formoterol (SYMBICORT) 160-4.5 MCG/ACT inhaler Inhale 2 puffs into the lungs 2 (two) times daily.   Yes [provider]  isosorbide mononitrate (IMDUR) 120 MG 24 hr tablet Take one tablet (120 mg) by mouth at bedtime Patient taking differently: Take 60 mg by mouth 2 (two) times daily.  03/28/16  Yes [name], MD  losartan (COZAAR) 25 MG tablet Take 25 mg by mouth daily.   Yes [provider]  meclizine (ANTIVERT) 12.5 MG tablet Take 1-2 tablets by mouth 3 (three) times daily as needed. 01/05/17  Yes [provider]  metoprolol tartrate (LOPRESSOR) 25 MG tablet TAKE ONE TABLET BY MOUTH IN THE MORNING AND TAKE TWO  TABLETS BY MOUTHIN THE EVENING. Patient taking differently: Take 25-50 mg by mouth 2 (two) times daily. TAKE ONE TABLET BY MOUTH IN THE MORNING AND TAKE TWO TABLETS BY MOUTHIN THE EVENING. 01/24/17  Yes [name], MD  nitroGLYCERIN (NITROSTAT) 0.4 MG SL tablet Place 1 tablet (0.4 mg total) under the tongue every 5 (five) minutes as needed for chest pain. 05/09/16  Yes Wende Bushy, MD  omeprazole (PRILOSEC) 40 MG capsule Take 40 mg by mouth daily.   Yes [provider]  PARoxetine (PAXIL) 30 MG tablet Take 30 mg by mouth daily.    Yes [provider]  ranolazine (RANEXA) 500 MG 12 hr tablet Take 1,000 mg by mouth 2 (two) times daily.    Yes [provider]  traMADol (ULTRAM) 50 MG tablet Take 1-2 tablets (50-100 mg total) by mouth every 4 (four) hours as needed for moderate pain. Patient taking differently: Take 50 mg by mouth every 6 (six) hours as needed for moderate pain.  01/16/16  Yes Duanne Guess, PA-C  Cholecalciferol (VITAMIN D) 2000 UNITS tablet Take 2,000 Units by mouth daily.    [provider]  levofloxacin (LEVAQUIN) 750 MG tablet  Take 1 tablet (750 mg total) by mouth daily. Patient not taking: Reported on 02/07/2017 09/07/16   Frederich Cha, MD  predniSONE (DELTASONE) 10 MG tablet Take 5 tablets (50 mg total) by mouth daily with breakfast. X 10 days Patient not taking: Reported on 02/07/2017 07/11/16   Flora Lipps, MD  predniSONE (STERAPRED UNI-PAK 21 TAB) 10 MG (21) TBPK tablet Sig 6 tablet day 1, 5 tablets day 2, 4 tablets day 3,,3tablets day 4, 2 tablets day 5, 1 tablet day 6 take all tablets orally Patient not taking: Reported on 02/07/2017 09/07/16   Frederich Cha, MD  Tiotropium Bromide Monohydrate (SPIRIVA RESPIMAT) 1.25 MCG/ACT AERS Inhale 2 puffs into the lungs 2 (two) times daily. Patient not taking: Reported on 02/07/2017 07/11/16   Flora Lipps, MD    Inpatient Medications:  . arformoterol  15 mcg Nebulization BID  . aspirin EC  81 mg Oral Daily  . atorvastatin  80 mg Oral QPM  . budesonide  0.5 mg Nebulization BID  . isosorbide mononitrate  60 mg Oral BID  . losartan  25 mg Oral Daily  . metoprolol tartrate  25 mg Oral BID  . pantoprazole  40 mg Oral Daily  . PARoxetine  30 mg Oral Daily  . ranolazine  500 mg Oral BID  . tiotropium  1 capsule Inhalation Daily   . heparin 1,000 Units/hr (02/07/17 1824)  . nitroGLYCERIN 0 mcg/min (02/08/17 0053)    Allergies: No Known Allergies  Social History   Socioeconomic History  . Marital status: Married    Spouse name: Not on file  . Number of children: Not on file  . Years of education: Not on file  . Highest education level: Not on file  Social Needs  . Financial resource strain: Not on file  . Food insecurity - worry: Not on file  . Food insecurity - inability: Not on file  . Transportation needs - medical: Not on file  . Transportation needs - non-medical: Not on file  Occupational History  . Not on file  Tobacco Use  . Smoking status: Former Smoker    Packs/day: 1.00    Years: 30.00    Pack years: 30.00    Types: Cigarettes    Last attempt to quit:  07/26/2008    Years  since quitting: 8.5  . Smokeless tobacco: Never Used  Substance and Sexual Activity  . Alcohol use: No  . Drug use: No  . Sexual activity: No  Other Topics Concern  . Not on file  Social History Narrative  . Not on file     Family History  Problem Relation Age of Onset  . Cancer Mother        'male cancer"  . Hypertension Mother   . Hyperlipidemia Mother   . Heart disease Father   . Heart attack Father   . Hypertension Father   . Hyperlipidemia Father      Review of Systems Positive for chest pain Negative for: General:  chills, fever, night sweats or weight changes.  Cardiovascular: PND orthopnea syncope dizziness  Dermatological skin lesions rashes Respiratory: Cough congestion Urologic: Frequent urination urination at night and hematuria Abdominal: negative for nausea, vomiting, diarrhea, bright red blood per rectum, melena, or hematemesis Neurologic: negative for visual changes, and/or hearing changes  All other systems reviewed and are otherwise negative except as noted above.  Labs: Recent Labs    02/07/17 1655 02/07/17 1958 02/07/17 2301 02/08/17 0222  TROPONINI <0.03 <0.03 <0.03 <0.03   Lab Results  Component Value Date   WBC 8.6 02/08/2017   HGB 14.9 02/08/2017   HCT 44.3 02/08/2017   MCV 95.9 02/08/2017   PLT 189 02/08/2017    Recent Labs  Lab 02/07/17 1655  NA 137  K 4.6  CL 105  CO2 25  BUN 30*  CREATININE 1.08  CALCIUM 9.1  GLUCOSE 105*   No results found for: CHOL, HDL, LDLCALC, TRIG No results found for: DDIMER  Radiology/Studies:  Dg Chest Port 1 View  Result Date: 02/07/2017 CLINICAL DATA:  Left-sided chest pain. EXAM: PORTABLE CHEST 1 VIEW COMPARISON:  04/27/2016 FINDINGS: Dual lead cardiac pacemaker. Cardiomediastinal silhouette is normal. Mediastinal contours appear intact. Calcific atherosclerotic disease of the aorta. There is no evidence of focal airspace consolidation, pleural effusion or pneumothorax.  Osseous structures are without acute abnormality. Soft tissues are grossly normal. IMPRESSION: Calcific atherosclerotic disease of the aorta. Otherwise no evidence of acute cardiopulmonary disease. Electronically Signed   By: Fidela Salisbury M.D.   On: 02/07/2017 18:31    EKG: Normal sinus rhythm  Weights: Filed Weights   02/07/17 1653 02/07/17 1929  Weight: 99.8 kg (220 lb) 99.7 kg (219 lb 12.8 oz)     Physical Exam: Blood pressure 134/74, pulse 65, temperature 98.1 F (36.7 C), temperature source Oral, resp. rate 15, height 5\' 7"  (1.702 m), weight 99.7 kg (219 lb 12.8 oz), SpO2 96 %. Body mass index is 34.43 kg/m. General: Well developed, well nourished, in no acute distress. Head eyes ears nose throat: Normocephalic, atraumatic, sclera non-icteric, no xanthomas, nares are without discharge. No apparent thyromegaly and/or mass  Lungs: Normal respiratory effort.  no wheezes, no rales, no rhonchi.  Heart: RRR with normal S1 S2. no murmur gallop, no rub, PMI is normal size and placement, carotid upstroke normal without bruit, jugular venous pressure is normal Abdomen: Soft, non-tender, non-distended with normoactive bowel sounds. No hepatomegaly. No rebound/guarding. No obvious abdominal masses. Abdominal aorta is normal size without bruit Extremities: No edema. no cyanosis, no clubbing, no ulcers  Peripheral : 2+ bilateral upper extremity pulses, 2+ bilateral femoral pulses, 2+ bilateral dorsal pedal pulse Neuro: Alert and oriented. No facial asymmetry. No focal deficit. Moves all extremities spontaneously. Musculoskeletal: Normal muscle tone without kyphosis Psych:  Responds to questions appropriately with a  normal affect.    Assessment: 72 year old male with known coronary artery disease essential hypertension mixed hyperlipidemia with severe unstable angina now completely relieved with appropriate medications without evidence of myocardial infarction  Plan: 1.  Continue nitrate  aspirin and antiplatelet medication management 2.  Proceed to cardiac catheterization to assess coronary anatomy and further treatment thereof is necessary.  Patient understands the risk and benefits of cardiac catheterization.  This includes a possibility of death stroke heart attack infection bleeding blood clot.  He is at low risk for conscious sedation  Signed, [name] M.D. [facility] 02/08/2017, 8:52 AM

## 2017-02-08 NOTE — Progress Notes (Signed)
[facility] Encounter Note  Patient: Donald Juarez / Admit Date: 02/07/2017 / Date of Encounter: 02/08/2017, 1:44 PM   Subjective: No more chest pain after nitroglycerin drip.  No evidence of microinfarction with normal troponin. Cardiac catheterization showing mild to moderate apical LV systolic dysfunction with ejection fraction of 35-40% Left main stenosis 45%, LAD stenosis 80% ostial circumflex stenosis 85% mid right coronary artery stenosis 90%  Review of Systems: Positive for: Pain Negative for: Vision change, hearing change, syncope, dizziness, nausea, vomiting,diarrhea, bloody stool, stomach pain, cough, congestion, diaphoresis, urinary frequency, urinary pain,skin lesions, skin rashes Others previously listed  Objective: Telemetry: Normal sinus rhythm Physical Exam: Blood pressure 120/77, pulse 65, temperature 97.6 F (36.4 C), temperature source Oral, resp. rate 18, height 5\' 7"  (1.702 m), weight 99.7 kg (219 lb 12.8 oz), SpO2 92 %. Body mass index is 34.43 kg/m. General: Well developed, well nourished, in no acute distress. Head: Normocephalic, atraumatic, sclera non-icteric, no xanthomas, nares are without discharge. Neck: No apparent masses Lungs: Normal respirations with no wheezes, no rhonchi, no rales , no crackles   Heart: Regular rate and rhythm, normal S1 S2, no murmur, no rub, no gallop, PMI is normal size and placement, carotid upstroke normal without bruit, jugular venous pressure normal Abdomen: Soft, non-tender, non-distended with normoactive bowel sounds. No hepatosplenomegaly. Abdominal aorta is normal size without bruit Extremities: No edema, no clubbing, no cyanosis, no ulcers,  Peripheral: 2+ radial, 2+ femoral, 2+ dorsal pedal pulses Neuro: Alert and oriented. Moves all extremities spontaneously. Psych:  Responds to questions appropriately with a normal affect.   Intake/Output Summary (Last 24 hours) at 02/08/2017 1344 Last data  filed at 02/08/2017 0300 Gross per 24 hour  Intake 139.65 ml  Output 300 ml  Net -160.35 ml    Inpatient Medications:  . [MAR Hold] arformoterol  15 mcg Nebulization BID  . [START ON 02/09/2017] aspirin  81 mg Oral Pre-Cath  . [MAR Hold] aspirin EC  81 mg Oral Daily  . [MAR Hold] atorvastatin  80 mg Oral QPM  . [MAR Hold] budesonide  0.5 mg Nebulization BID  . [MAR Hold] isosorbide mononitrate  60 mg Oral BID  . [MAR Hold] losartan  25 mg Oral Daily  . [MAR Hold] metoprolol tartrate  25 mg Oral BID  . [MAR Hold] pantoprazole  40 mg Oral Daily  . [MAR Hold] PARoxetine  30 mg Oral Daily  . [MAR Hold] ranolazine  500 mg Oral BID  . sodium chloride flush  3 mL Intravenous Q12H  . [MAR Hold] tiotropium  1 capsule Inhalation Daily   Infusions:  . sodium chloride    . [START ON 02/09/2017] sodium chloride 3 mL/kg/hr (02/08/17 1211)   Followed by  . [START ON 02/09/2017] sodium chloride    . heparin 1,000 Units/hr (02/07/17 1824)  . [MAR Hold] nitroGLYCERIN 0 mcg/min (02/08/17 0053)    Labs: Recent Labs    02/07/17 1655  NA 137  K 4.6  CL 105  CO2 25  GLUCOSE 105*  BUN 30*  CREATININE 1.08  CALCIUM 9.1   No results for input(s): AST, ALT, ALKPHOS, BILITOT, PROT, ALBUMIN in the last 72 hours. Recent Labs    02/07/17 1655 02/08/17 0222  WBC 9.3 8.6  HGB 15.8 14.9  HCT 46.6 44.3  MCV 97.1 95.9  PLT 202 189   Recent Labs    02/07/17 1655 02/07/17 1958 02/07/17 2301 02/08/17 0222  TROPONINI <0.03 <0.03 <0.03 <0.03   Invalid input(s):  POCBNP No results for input(s): HGBA1C in the last 72 hours.   Weights: Filed Weights   02/07/17 1653 02/07/17 1929  Weight: 99.8 kg (220 lb) 99.7 kg (219 lb 12.8 oz)     Radiology/Studies:  Dg Chest Port 1 View  Result Date: 02/07/2017 CLINICAL DATA:  Left-sided chest pain. EXAM: PORTABLE CHEST 1 VIEW COMPARISON:  04/27/2016 FINDINGS: Dual lead cardiac pacemaker. Cardiomediastinal silhouette is normal. Mediastinal contours  appear intact. Calcific atherosclerotic disease of the aorta. There is no evidence of focal airspace consolidation, pleural effusion or pneumothorax. Osseous structures are without acute abnormality. Soft tissues are grossly normal. IMPRESSION: Calcific atherosclerotic disease of the aorta. Otherwise no evidence of acute cardiopulmonary disease. Electronically Signed   By: Fidela Salisbury M.D.   On: 02/07/2017 18:31     Assessment and Recommendation  72 y.o. male with known coronary artery disease post chins having significant three-vessel coronary artery disease after [place] class IV unstable angina out evidence of myocardial infarction 1.  Continue antiplatelet medication management with aspirin 2.  Beta-blocker ACE inhibitor Aldactone for LV systolic dysfunction and previous myocardial infarction 3.  Isosorbide and/or nitrates for chest discomfort 4.  Further intervention including transfer for coronary artery bypass grafting for critical three-vessel coronary artery disease  Signed, Serafina Royals M.D. FACC

## 2017-02-08 NOTE — Consult Note (Signed)
ANTICOAGULATION CONSULT NOTE - Initial Consult  Pharmacy Consult for Heparin Drip Dosing and Monitoring   Indication: chest pain/ACS  No Known Allergies  Patient Measurements: Height: 5\' 7"  (170.2 cm) Weight: 219 lb 12.8 oz (99.7 kg) IBW/kg (Calculated) : 66.1 Heparin Dosing Weight: 82kg   Vital Signs: Temp: 98.1 F (36.7 C) (01/10 0100) Temp Source: Oral (01/10 0100) BP: 134/74 (01/10 0600) Pulse Rate: 65 (01/10 0200)  Labs: Recent Labs    02/07/17 1655 02/07/17 1958 02/07/17 2301 02/08/17 0222 02/08/17 0536  HGB 15.8  --   --  14.9  --   HCT 46.6  --   --  44.3  --   PLT 202  --   --  189  --   APTT 29  --   --   --   --   LABPROT 12.6  --   --   --   --   INR 0.95  --   --   --   --   HEPARINUNFRC  --   --   --   --  0.39  CREATININE 1.08  --   --   --   --   TROPONINI <0.03 <0.03 <0.03 <0.03  --     Estimated Creatinine Clearance: 69.5 mL/min (by C-G formula based on SCr of 1.08 mg/dL).   Medical History: Past Medical History:  Diagnosis Date  . Anginal pain ([place])    feels this as jaw pain.  takes imdur for this. rarely uses nitro.  [name] Anxiety   . Arthritis   . Asthma   . Bilateral claudication of lower limb ([place])   . Carotid stenosis    a. 07/2013 Carotid U/S: 100% RICA (pt prev reported h/o 62%), LICA 95-28%, bilat ECA 50%, normal vertebrals and subclavians bilat-->F/U needed in 01/2014.  . Cataract of left eye   . Cataract, right    a. 10/2010 s/p cataract surgery   . COPD (chronic obstructive pulmonary disease) ([place])   . Coronary artery disease    a. 06/1996 & 03/2005 Cath: non-obs dzs;  b. 07/2009 NSTEMI/Cath: LCX 99->PCI/DES extending into OM1;  c. 01/2013 Cath: patent LCX stent-->Med Rx.; d. cath 03/2014: RPAV dzs->Med rx; e. 10/2014 Cath/PCI: LM 30, LAD 20p/m, LCX 50ost/p, 10 ISR, 60d, OM1 min irregs, OM2 50, RCA 30ost, 33m, 60d, RPDA 70(2.5x8 Xience DES), RPAV 90small(PTCA); f. 10/2014 Relook Cath: RPAV 99(Med Rx), EF 45-50.  [name] Dysrhythmia    since  ablation, flutter resolved.  . Essential hypertension   . Hyperkalemia   . Hyperlipidemia   . Myocardial infarction ([place]) 2015   had stent inserted and then had heart attack after. stent inserted due to blockage  . Paroxysmal atrial flutter ([place])    a. CHA2DS2VASc = 3 -> eliquis;  b. 07/2013 Echo: EF 50-55%, mildly dil LA.c. s/p RFCA 10/31/2013 by Dr Caryl Comes  . Prostate cancer ([place])   . Pulmonary nodule, right    a. 0.7cm RLL - stable by CT 06/2013.  [name] Rectal cancer ([place])    a. Status post colostomy  . Seizures ([place])    a. last 30 years ago  . Shingles    a. 09/2012.  . SSS (sick sinus syndrome) ([place])    a. 12/2009 S/P MDT Adapta ADDR01 DC PPM, ser # [id number] ([name]).  . Syncope and collapse     Assessment: Pharmacy consulted for heparin drip dosing and monitoring for 72yo male with ACS/Chest Pain  Goal of Therapy:  Heparin level 0.3-0.7 units/ml  Monitor platelets by anticoagulation protocol: Yes   Plan:  Baseline labs ordered Dosing weight 82kg  Give 4000 units bolus x 1 Start heparin infusion at 1000 units/hr Check anti-Xa level in 8 hours and daily while on heparin Continue to monitor H&H and platelets   1/10 AM heparin level 0.39. Recheck in 8 hours to confirm.  Eloise Harman, PharmD, BCPS Clinical Pharmacist 02/08/2017 6:49 AM

## 2017-02-09 ENCOUNTER — Encounter: Payer: Self-pay | Admitting: Internal Medicine

## 2017-02-09 LAB — PHOSPHORUS: PHOSPHORUS: 3 mg/dL (ref 2.5–4.6)

## 2017-02-09 LAB — BASIC METABOLIC PANEL
Anion gap: 8 (ref 5–15)
BUN: 24 mg/dL — AB (ref 6–20)
CALCIUM: 8.6 mg/dL — AB (ref 8.9–10.3)
CO2: 22 mmol/L (ref 22–32)
CREATININE: 0.83 mg/dL (ref 0.61–1.24)
Chloride: 108 mmol/L (ref 101–111)
Glucose, Bld: 118 mg/dL — ABNORMAL HIGH (ref 65–99)
Potassium: 4.2 mmol/L (ref 3.5–5.1)
SODIUM: 138 mmol/L (ref 135–145)

## 2017-02-09 LAB — CBC
HCT: 43.8 % (ref 40.0–52.0)
Hemoglobin: 14.7 g/dL (ref 13.0–18.0)
MCH: 32.2 pg (ref 26.0–34.0)
MCHC: 33.6 g/dL (ref 32.0–36.0)
MCV: 95.7 fL (ref 80.0–100.0)
PLATELETS: 157 10*3/uL (ref 150–440)
RBC: 4.57 MIL/uL (ref 4.40–5.90)
RDW: 14.1 % (ref 11.5–14.5)
WBC: 9.4 10*3/uL (ref 3.8–10.6)

## 2017-02-09 LAB — MAGNESIUM: MAGNESIUM: 1.8 mg/dL (ref 1.7–2.4)

## 2017-02-09 MED ORDER — ARFORMOTEROL TARTRATE 15 MCG/2ML IN NEBU
15.0000 ug | INHALATION_SOLUTION | Freq: Two times a day (BID) | RESPIRATORY_TRACT | Status: DC
  Administered 2017-02-09: 15 ug via RESPIRATORY_TRACT
  Filled 2017-02-09 (×2): qty 2

## 2017-02-09 MED ORDER — NITROGLYCERIN 2 % TD OINT: 1.0000 [in_us] | TOPICAL_OINTMENT | Freq: Four times a day (QID) | TRANSDERMAL | 0 refills | Status: DC

## 2017-02-09 NOTE — Discharge Summary (Signed)
Donald Juarez, is a 72 y.o. male  DOB 10/16/1945  MRN [id number].  Admission date:  02/07/2017  Admitting Physician  Nicholes Mango, MD  Discharge Date:  02/09/2017   Primary MD  Sofie Hartigan, MD  Recommendations for primary care physician for things to follow:  Patient is going to [facility] for CABG.    Admission Diagnosis  Unstable angina (HCC) [I20.0] Chest pain [R07.9]   Discharge Diagnosis  Unstable angina ([place]) [I20.0] Chest pain [R07.9]    Active Problems:   Unstable angina [facility])      Past Medical History:  Diagnosis Date  . Anginal pain ([place])    feels this as jaw pain.  takes imdur for this. rarely uses nitro.  [name] Anxiety   . Arthritis   . Asthma   . Bilateral claudication of lower limb ([place])   . Carotid stenosis    a. 07/2013 Carotid U/S: 100% RICA (pt prev reported h/o 40%), LICA 10-27%, bilat ECA 50%, normal vertebrals and subclavians bilat-->F/U needed in 01/2014.  . Cataract of left eye   . Cataract, right    a. 10/2010 s/p cataract surgery   . COPD (chronic obstructive pulmonary disease) ([place])   . Coronary artery disease    a. 06/1996 & 03/2005 Cath: non-obs dzs;  b. 07/2009 NSTEMI/Cath: LCX 99->PCI/DES extending into OM1;  c. 01/2013 Cath: patent LCX stent-->Med Rx.; d. cath 03/2014: RPAV dzs->Med rx; e. 10/2014 Cath/PCI: LM 30, LAD 20p/m, LCX 50ost/p, 10 ISR, 60d, OM1 min irregs, OM2 50, RCA 30ost, 54m, 60d, RPDA 70(2.5x8 Xience DES), RPAV 90small(PTCA); f. 10/2014 Relook Cath: RPAV 99(Med Rx), EF 45-50.  [name] Dysrhythmia    since ablation, flutter resolved.  . Essential hypertension   . Hyperkalemia   . Hyperlipidemia   . Myocardial infarction ([place]) 2015   had stent inserted and then had heart attack after. stent inserted due to blockage  . Paroxysmal atrial flutter ([place])    a. CHA2DS2VASc = 3  -> eliquis;  b. 07/2013 Echo: EF 50-55%, mildly dil LA.c. s/p RFCA 10/31/2013 by Dr Caryl Comes  . Prostate cancer ([place])   . Pulmonary nodule, right    a. 0.7cm RLL - stable by CT 06/2013.  [name] Rectal cancer ([place])    a. Status post colostomy  . Seizures ([place])    a. last 30 years ago  . Shingles    a. 09/2012.  . SSS (sick sinus syndrome) ([place])    a. 12/2009 S/P MDT Adapta ADDR01 DC PPM, ser # [id number] ([name]).  . Syncope and collapse     Past Surgical History:  Procedure Laterality Date  . ABLATION  10/31/2013   CTI ablation by Dr Caryl Comes for atrial flutter  . ATRIAL FLUTTER ABLATION N/A 10/31/2013   Procedure: ATRIAL FLUTTER ABLATION;  Surgeon: [name], MD;  Location: [facility] CATH LAB;  Service: Cardiovascular;  Laterality: N/A;  . CARDIAC CATHETERIZATION  07/[facility]   [facility]  . CARDIAC CATHETERIZATION  01/2013   [facility]  . CARDIAC CATHETERIZATION  03/2014   [facility]  . CARDIAC CATHETERIZATION N/A 11/02/2014   Procedure: Left Heart Cath and Cors/Grafts Angiography;  Surgeon: [name], MD;  Location: [place] CV LAB;  Service: Cardiovascular;  Laterality: N/A;  . CARDIAC CATHETERIZATION N/A 11/02/2014   Procedure: Coronary Stent Intervention;  Surgeon: [name], MD;  Location: [place] CV LAB;  Service: Cardiovascular;  Laterality: N/A;  . CARDIAC CATHETERIZATION N/A 11/03/2014   Procedure: Left Heart Cath and Coronary  Angiography;  Surgeon: [name], MD;  Location: [place] CV LAB;  Service: Cardiovascular;  Laterality: N/A;  . CARDIAC SURGERY  2011   Stents placed   . CATARACT EXTRACTION Left   . CATARACT EXTRACTION W/PHACO Left 06/01/2014   Procedure: CATARACT EXTRACTION PHACO AND INTRAOCULAR LENS PLACEMENT (IOC);  Surgeon: [name], MD;  Location: [name] ORS;  Service: Ophthalmology;  Laterality: Left;  . cataract surgery  2012  . colon surgery     cancer removal  . COLON SURGERY     colostomy  . COLONOSCOPY WITH PROPOFOL N/A 05/24/2016   Procedure:  COLONOSCOPY WITH PROPOFOL;  Surgeon: [name], MD;  Location: [facility] ENDOSCOPY;  Service: Endoscopy;  Laterality: N/A;  . CORONARY ANGIOPLASTY    . INSERT / REPLACE / REMOVE PACEMAKER  2011   just a pacer  . KNEE ARTHROSCOPY Right 1980's  . LEFT HEART CATH AND CORONARY ANGIOGRAPHY N/A 02/08/2017   Procedure: LEFT HEART CATH AND CORONARY ANGIOGRAPHY;  Surgeon: [name], MD;  Location: [place] CV LAB;  Service: Cardiovascular;  Laterality: N/A;  . PACEMAKER PLACEMENT  01/17/2010  . TOTAL KNEE ARTHROPLASTY Right 01/14/2016   Procedure: TOTAL KNEE ARTHROPLASTY;  Surgeon: [name], MD;  Location: [name] ORS;  Service: Orthopedics;  Laterality: Right;       History of present illness and  Hospital Course:     Kindly see H&P for history of present illness and admission details, please review complete Labs, Consult reports and Test reports for all details in brief  HPI  from the history and physical done on the day of admission  72 year old male patient with history of CAD status post 2 stents in the past, essential hypertension, hyperlipidemia, COPD coming because of recurrent crushing chest pain1 year old male patient with history of CAD status post 2 stents in the past, essential hypertension, hyperlipidemia, COPD, and because of the current crushing chest pain that started started yesterday afternoon while doing groseryshopping.  Patient took sublingual nitro and went home, and went home, after she completed a, patient had recurrent chest pain refused to come to ED, patient had recurrent chest pain and came to you today.  and came to ED. Initial troponins are negative, EKG did not show acute ST changes.  Admitted to ICU and started on nitro drip     Hospital Course   Chest pain with unstable angina; admitted to ICU, started on nitro drip, heparin drip, admitted to ICU, started on nitro drip, heparin drip, blood pressure  is elevated and he came is elevateup to  200/125.    Initial troponin is negative , EKG with no acute ST-T wave changes but the patient was having crushing chest heaviness radiating to the left jaw and shoulder associated with diaphoresis ,.chest pain improved with nitro drip. No evidence of MI with normal troponins. No more chest pain after nitroglycerin drip.  No evidence of microinfarction with normal troponin. Cardiac catheterization showing mild to moderate apical LV systolic dysfunction with ejection fraction of 35-40% Left main stenosis 45%, LAD stenosis 80% ostial circumflex stenosis 85% mid right coronary artery stenosis 90% significant three-vessel coronary artery disease , cardiology recommended CABG, patient is going to [facility] today for CABG.  2.  Beta-blocker ACE inhibitor Aldactone for LV systolic dysfunction and previous myocardial infarction 3.  Isosorbide and/or nitrates Pt agreeable for CABG  2.COPD No exacerbation at this time Albuterol as needed and continue Spiriva   3.Anxiety;continue Paxil. Patient has been accepted [facility] for [facility].  And he will go there.   Discharge Instructions  and  Discharge Medications      Allergies as of 02/09/2017   No Known Allergies     Medication List    STOP taking these medications   levofloxacin 750 MG tablet Commonly known as:  LEVAQUIN   predniSONE 10 MG (21) Tbpk tablet Commonly known as:  STERAPRED UNI-PAK 21 TAB   predniSONE 10 MG tablet Commonly known as:  DELTASONE     TAKE these medications   albuterol 108 (90 Base) MCG/ACT inhaler Commonly known as:  PROAIR HFA Inhale 2 puffs into the lungs every 4 (four) hours as needed for wheezing or shortness of breath.   albuterol (2.5 MG/3ML) 0.083% nebulizer solution Commonly known as:  PROVENTIL Take 3 mLs (2.5 mg total) by nebulization every 6 (six) hours as needed for wheezing or shortness of breath. DX:J44.9   arformoterol 15 MCG/2ML Nebu Commonly known as:  BROVANA Take 2 mLs (15  mcg total) by nebulization 2 (two) times daily. Take 2 mLs (15 mcg total) by nebulization 2 (two) times daily. DX code 493.20 Every morning and 1800   aspirin EC 81 MG tablet Take 81 mg by mouth daily.   atorvastatin 80 MG tablet Commonly known as:  LIPITOR Take 1 tablet (80 mg total) by mouth daily.   budesonide 0.5 MG/2ML nebulizer solution Commonly known as:  PULMICORT Take 2 mLs (0.5 mg total) by nebulization 2 (two) times daily. DX code 493.20 Every morning and 1800   budesonide-formoterol 160-4.5 MCG/ACT inhaler Commonly known as:  SYMBICORT Inhale 2 puffs into the lungs 2 (two) times daily.   isosorbide mononitrate 120 MG 24 hr tablet Commonly known as:  IMDUR Take one tablet (120 mg) by mouth at bedtime What changed:    how much to take  how to take this  when to take this  additional instructions   losartan 25 MG tablet Commonly known as:  COZAAR Take 25 mg by mouth daily.   meclizine 12.5 MG tablet Commonly known as:  ANTIVERT Take 1-2 tablets by mouth 3 (three) times daily as needed.   metoprolol tartrate 25 MG tablet Commonly known as:  LOPRESSOR TAKE ONE TABLET BY MOUTH IN THE MORNING AND TAKE TWO TABLETS BY MOUTHIN THE EVENING. What changed:    how much to take  how to take this  when to take this  additional instructions   nitroGLYCERIN 0.4 MG SL tablet Commonly known as:  NITROSTAT Place 1 tablet (0.4 mg total) under the tongue every 5 (five) minutes as needed for chest pain. What changed:  Another medication with the same name was added. Make sure you understand how and when to take each.   nitroGLYCERIN 2 % ointment Commonly known as:  NITROGLYN Apply 1 inch topically every 6 (six) hours. What changed:  You were already taking a medication with the same name, and this prescription was added. Make sure you understand how and when to take each.   omeprazole 40 MG capsule Commonly known as:  PRILOSEC Take 40 mg by mouth daily.    PARoxetine 30 MG tablet Commonly known as:  PAXIL Take 30 mg by mouth daily.   ranolazine 500 MG 12 hr tablet Commonly known as:  RANEXA Take 1,000 mg by mouth 2 (two) times daily.   Tiotropium Bromide Monohydrate 1.25 MCG/ACT Aers Commonly known as:  SPIRIVA RESPIMAT Inhale 2 puffs into the lungs 2 (two) times daily.   traMADol  50 MG tablet Commonly known as:  ULTRAM Take 1-2 tablets (50-100 mg total) by mouth every 4 (four) hours as needed for moderate pain. What changed:    how much to take  when to take this   Vitamin D 2000 units tablet Take 2,000 Units by mouth daily.         Diet and Activity recommendation: See Discharge Instructions above   Consults obtained - Cardiology   Major procedures and Radiology Reports - PLEASE review detailed and final reports for all details, in brief -      Dg Chest Port 1 View  Result Date: 02/07/2017 CLINICAL DATA:  Left-sided chest pain. EXAM: PORTABLE CHEST 1 VIEW COMPARISON:  04/27/2016 FINDINGS: Dual lead cardiac pacemaker. Cardiomediastinal silhouette is normal. Mediastinal contours appear intact. Calcific atherosclerotic disease of the aorta. There is no evidence of focal airspace consolidation, pleural effusion or pneumothorax. Osseous structures are without acute abnormality. Soft tissues are grossly normal. IMPRESSION: Calcific atherosclerotic disease of the aorta. Otherwise no evidence of acute cardiopulmonary disease. Electronically Signed   By: Fidela Salisbury M.D.   On: 02/07/2017 18:31    Micro Results     Recent Results (from the past 240 hour(s))  MRSA PCR Screening     Status: None   Collection Time: 02/07/17  7:26 PM  Result Value Ref Range Status   MRSA by PCR NEGATIVE NEGATIVE Final    Comment:        The GeneXpert MRSA Assay (FDA approved for NASAL specimens only), is one component of a comprehensive MRSA colonization surveillance program. It is not intended to diagnose MRSA infection nor to  guide or monitor treatment for MRSA infections. Performed at [facility], [address] [place], [place] [address]        Today   Subjective:   Woodward Klem today stable for transfer  Objective:   Blood pressure 136/78, pulse 65, temperature 98.6 F (37 C), temperature source Oral, resp. rate 16, height 5\' 7"  (1.702 m), weight 99.7 kg (219 lb 12.8 oz), SpO2 96 %.   Intake/Output Summary (Last 24 hours) at 02/09/2017 1421 Last data filed at 02/09/2017 0800 Gross per 24 hour  Intake 200 ml  Output 770 ml  Net -570 ml    Exam Awake Alert, Oriented x 3, No new F.N deficits, Normal affect [place].AT,PERRAL Supple Neck,No JVD, No cervical lymphadenopathy appriciated.  Symmetrical Chest wall movement, Good air movement bilaterally, CTAB RRR,No Gallops,Rubs or new Murmurs, No Parasternal Heave +ve B.Sounds, Abd Soft, Non tender, No organomegaly appriciated, No rebound -guarding or rigidity. No Cyanosis, Clubbing or edema, No new Rash or bruise  Data Review   CBC w Diff:  Lab Results  Component Value Date   WBC 9.4 02/09/2017   HGB 14.7 02/09/2017   HGB 14.4 10/26/2014   HCT 43.8 02/09/2017   HCT 42.6 10/26/2014   PLT 157 02/09/2017   PLT 181 10/26/2014   LYMPHOPCT 7.7 10/08/2013   MONOPCT 7.1 10/08/2013   EOSPCT 1.0 10/08/2013   BASOPCT 1.0 10/08/2013    CMP:  Lab Results  Component Value Date   NA 138 02/09/2017   NA 141 10/26/2014   NA 137 05/10/2014   K 4.2 02/09/2017   K 3.4 (L) 05/10/2014   CL 108 02/09/2017   CL 104 05/10/2014   CO2 22 02/09/2017   CO2 22 05/10/2014   BUN 24 (H) 02/09/2017   BUN 19 10/26/2014   BUN 20 05/10/2014   CREATININE 0.83 02/09/2017  CREATININE 1.15 05/10/2014   PROT 7.3 05/10/2014   ALBUMIN 3.9 05/10/2014   BILITOT 0.7 05/10/2014   ALKPHOS 67 05/10/2014   AST 41 05/10/2014   ALT 37 05/10/2014  .   Total Time in preparing paper work, data evaluation and todays exam - 35 minutes  Epifanio Lesches M.D  on 02/09/2017 at 2:21 PM    Note: This dictation was prepared with Dragon dictation along with smaller phrase technology. Any transcriptional errors that result from this process are unintentional.

## 2017-02-09 NOTE — Progress Notes (Signed)
Patient transported by [facility] to [facility] for CABG. All belongings sent with patient.  Patient alert and oriented.  Patient stable at time of transfer.  No acute distress noted.

## 2017-02-09 NOTE — Progress Notes (Signed)
  [facility] Progess Note    SYNOPSIS   72 yo male with a PMH of Syncope, Sick Sinus Syndrome, Pacemaker, Seizures, Rectal Cancer s/p Colostomy, COPD, Right Pulmonary Nodule, Paroxysmal Atrial Flutter, MI, Hyperlipidemia, HTN, Dysrhythmia, CAD, Carotid Stenosis, Bilateral Claudication of Lower Extremities, Asthma, Arthritis, Anxiety, admitted for unstable angina and cardiac catheterization   ASSESSMENT/PLAN   Patient with multiple medical problems, status post cardiac catheterization which revealed multivessel disease. We'll follow cardiology recommendations, pending transfer for CABG at [facility].  Name: Donald Juarez MRN: [id number] DOB: 12-03-1945    ADMISSION DATE:  02/07/2017  STUDIES:  Status post cardiac catheterization   SUBJECTIVE:   Patient states he tolerated catheterization well, is complaining of some vague inspiratory pain on his right side otherwise stable. Stable hemodynamics overnight. No leg pain noted   VITAL SIGNS: Temp:  [95.6 F (35.3 C)-98.9 F (37.2 C)] 98.2 F (36.8 C) (01/11 0500) Pulse Rate:  [63-106] 63 (01/11 0600) Resp:  [12-22] 13 (01/11 0600) BP: (103-159)/(61-87) 152/69 (01/11 0600) SpO2:  [84 %-98 %] 84 % (01/11 0600)  PHYSICAL EXAMINATION: Physical Examination:   VS: BP (!) 152/69   Pulse 63   Temp 98.2 F (36.8 C) (Oral)   Resp 13   Ht 5\' 7"  (1.702 m)   Wt 99.7 kg (219 lb 12.8 oz)   SpO2 (!) 84%   BMI 34.43 kg/m   General Appearance: No distress  Neuro:without focal findings, mental status normal. HEENT: PERRLA, EOM intact. Pulmonary: normal breath sounds   CardiovascularNormal S1,S2.  No m/r/g.   Abdomen: Benign, Soft, non-tender. Skin:   warm, no rashes, no ecchymosis  Extremities: normal, no cyanosis, clubbing.    LABORATORY PANEL:   CBC Recent Labs  Lab 02/09/17 0349  WBC 9.4  HGB 14.7  HCT 43.8  PLT 157    Chemistries  Recent Labs  Lab 02/09/17 0349  NA 138  K 4.2  CL 108  CO2 22    GLUCOSE 118*  BUN 24*  CREATININE 0.83  CALCIUM 8.6*  MG 1.8  PHOS 3.0    Recent Labs  Lab 02/07/17 1934  GLUCAP 96   No results for input(s): PHART, PCO2ART, PO2ART in the last 168 hours. No results for input(s): AST, ALT, ALKPHOS, BILITOT, ALBUMIN in the last 168 hours.  Cardiac Enzymes Recent Labs  Lab 02/08/17 0222  TROPONINI <0.03    RADIOLOGY:  Dg Chest Port 1 View  Result Date: 02/07/2017 CLINICAL DATA:  Left-sided chest pain. EXAM: PORTABLE CHEST 1 VIEW COMPARISON:  04/27/2016 FINDINGS: Dual lead cardiac pacemaker. Cardiomediastinal silhouette is normal. Mediastinal contours appear intact. Calcific atherosclerotic disease of the aorta. There is no evidence of focal airspace consolidation, pleural effusion or pneumothorax. Osseous structures are without acute abnormality. Soft tissues are grossly normal. IMPRESSION: Calcific atherosclerotic disease of the aorta. Otherwise no evidence of acute cardiopulmonary disease. Electronically Signed   By: Fidela Salisbury M.D.   On: 02/07/2017 18:31    Hermelinda Dellen, DO 02/09/2017

## 2017-02-09 NOTE — Progress Notes (Signed)
[facility] Encounter Note  Patient: Donald Juarez / Admit Date: 02/07/2017 / Date of Encounter: 02/09/2017, 8:39 AM   Subjective: No more chest pain after nitroglycerin drip was discontinued.  No evidence of myocardial infarction with normal troponin. Cardiac catheterization showing mild to moderate apical LV systolic dysfunction with ejection fraction of 35-40% Left main stenosis 45%, LAD stenosis 80% ostial circumflex stenosis 85% mid right coronary artery stenosis 90%  Review of Systems: Positive for: Weakness Negative for: Vision change, hearing change, syncope, dizziness, nausea, vomiting,diarrhea, bloody stool, stomach pain, cough, congestion, diaphoresis, urinary frequency, urinary pain,skin lesions, skin rashes Others previously listed  Objective: Telemetry: Normal sinus rhythm Physical Exam: Blood pressure (!) 152/69, pulse 63, temperature 98.2 F (36.8 C), temperature source Oral, resp. rate 13, height 5\' 7"  (1.702 m), weight 99.7 kg (219 lb 12.8 oz), SpO2 (!) 84 %. Body mass index is 34.43 kg/m. General: Well developed, well nourished, in no acute distress. Head: Normocephalic, atraumatic, sclera non-icteric, no xanthomas, nares are without discharge. Neck: No apparent masses Lungs: Normal respirations with no wheezes, no rhonchi, no rales , no crackles   Heart: Regular rate and rhythm, normal S1 S2, no murmur, no rub, no gallop, PMI is normal size and placement, carotid upstroke normal without bruit, jugular venous pressure normal Abdomen: Soft, non-tender, non-distended with normoactive bowel sounds. No hepatosplenomegaly. Abdominal aorta is normal size without bruit Extremities: No edema, no clubbing, no cyanosis, no ulcers,  Peripheral: 2+ radial, 2+ femoral, 2+ dorsal pedal pulses Neuro: Alert and oriented. Moves all extremities spontaneously. Psych:  Responds to questions appropriately with a normal affect.   Intake/Output Summary (Last 24  hours) at 02/09/2017 0839 Last data filed at 02/09/2017 0640 Gross per 24 hour  Intake 18 ml  Output 1170 ml  Net -1152 ml    Inpatient Medications:  . arformoterol  15 mcg Nebulization BID  . aspirin EC  81 mg Oral Daily  . atorvastatin  80 mg Oral QPM  . budesonide  0.5 mg Nebulization BID  . losartan  25 mg Oral Daily  . metoprolol tartrate  25 mg Oral BID  . nitroGLYCERIN  1 inch Topical Q6H  . pantoprazole  40 mg Oral Daily  . PARoxetine  30 mg Oral Daily  . ranolazine  500 mg Oral BID  . tiotropium  1 capsule Inhalation Daily   Infusions:    Labs: Recent Labs    02/07/17 1655 02/09/17 0349  NA 137 138  K 4.6 4.2  CL 105 108  CO2 25 22  GLUCOSE 105* 118*  BUN 30* 24*  CREATININE 1.08 0.83  CALCIUM 9.1 8.6*  MG  --  1.8  PHOS  --  3.0   No results for input(s): AST, ALT, ALKPHOS, BILITOT, PROT, ALBUMIN in the last 72 hours. Recent Labs    02/08/17 0222 02/09/17 0349  WBC 8.6 9.4  HGB 14.9 14.7  HCT 44.3 43.8  MCV 95.9 95.7  PLT 189 157   Recent Labs    02/07/17 1655 02/07/17 1958 02/07/17 2301 02/08/17 0222  TROPONINI <0.03 <0.03 <0.03 <0.03   Invalid input(s): POCBNP No results for input(s): HGBA1C in the last 72 hours.   Weights: Filed Weights   02/07/17 1653 02/07/17 1929  Weight: 99.8 kg (220 lb) 99.7 kg (219 lb 12.8 oz)     Radiology/Studies:  Dg Chest Port 1 View  Result Date: 02/07/2017 CLINICAL DATA:  Left-sided chest pain. EXAM: PORTABLE CHEST 1 VIEW COMPARISON:  04/27/2016  FINDINGS: Dual lead cardiac pacemaker. Cardiomediastinal silhouette is normal. Mediastinal contours appear intact. Calcific atherosclerotic disease of the aorta. There is no evidence of focal airspace consolidation, pleural effusion or pneumothorax. Osseous structures are without acute abnormality. Soft tissues are grossly normal. IMPRESSION: Calcific atherosclerotic disease of the aorta. Otherwise no evidence of acute cardiopulmonary disease. Electronically Signed    By: Fidela Salisbury M.D.   On: 02/07/2017 18:31     Assessment and Recommendation  72 y.o. male with known coronary artery disease post chins having significant three-vessel coronary artery disease after [place] class IV unstable angina out evidence of myocardial infarction 1.  Continue antiplatelet medication management with aspirin 2.  Beta-blocker ACE inhibitor Aldactone for LV systolic dysfunction and previous myocardial infarction 3.  Isosorbide and/or nitrates for chest discomfort 4.  Further intervention including transfer for coronary artery bypass grafting for critical three-vessel coronary artery disease  Signed, Serafina Royals M.D. FACC

## 2017-02-13 HISTORY — PX: CORONARY ARTERY BYPASS GRAFT: SHX141

## 2017-02-16 ENCOUNTER — Other Ambulatory Visit: Payer: Self-pay | Admitting: *Deleted

## 2017-02-16 DIAGNOSIS — I6529 Occlusion and stenosis of unspecified carotid artery: Secondary | ICD-10-CM

## 2017-02-23 MED ORDER — FUROSEMIDE 40 MG PO TABS
40.00 | ORAL_TABLET | ORAL | Status: DC
Start: 2017-02-23 — End: 2017-02-23

## 2017-02-23 MED ORDER — SULFAMETHOXAZOLE-TRIMETHOPRIM 400-80 MG PO TABS
1.00 | ORAL_TABLET | ORAL | Status: DC
Start: 2017-02-23 — End: 2017-02-23

## 2017-02-23 MED ORDER — ATORVASTATIN CALCIUM 80 MG PO TABS
80.00 | ORAL_TABLET | ORAL | Status: DC
Start: 2017-02-24 — End: 2017-02-23

## 2017-02-23 MED ORDER — GENERIC EXTERNAL MEDICATION
30.00 | Status: DC
Start: 2017-02-24 — End: 2017-02-23

## 2017-02-23 MED ORDER — GENERIC EXTERNAL MEDICATION
1.00 | Status: DC
Start: ? — End: 2017-02-23

## 2017-02-23 MED ORDER — LIDOCAINE 5 % EX PTCH
2.00 | MEDICATED_PATCH | CUTANEOUS | Status: DC
Start: 2017-02-23 — End: 2017-02-23

## 2017-02-23 MED ORDER — ASPIRIN 81 MG PO CHEW
81.00 | CHEWABLE_TABLET | ORAL | Status: DC
Start: 2017-02-24 — End: 2017-02-23

## 2017-02-23 MED ORDER — ONDANSETRON HCL 4 MG/2ML IJ SOLN
4.00 | INTRAMUSCULAR | Status: DC
Start: ? — End: 2017-02-23

## 2017-02-23 MED ORDER — MAGNESIUM OXIDE 400 MG PO TABS
800.00 | ORAL_TABLET | ORAL | Status: DC
Start: 2017-02-23 — End: 2017-02-23

## 2017-02-23 MED ORDER — TIOTROPIUM BROMIDE MONOHYDRATE 18 MCG IN CAPS
18.00 | ORAL_CAPSULE | RESPIRATORY_TRACT | Status: DC
Start: 2017-02-24 — End: 2017-02-23

## 2017-02-23 MED ORDER — SENNOSIDES-DOCUSATE SODIUM 8.6-50 MG PO TABS
1.00 | ORAL_TABLET | ORAL | Status: DC
Start: 2017-02-23 — End: 2017-02-23

## 2017-02-23 MED ORDER — PEPPERMINT OIL PO
ORAL | Status: DC
Start: ? — End: 2017-02-23

## 2017-02-23 MED ORDER — POTASSIUM CHLORIDE ER 20 MEQ PO TBCR
20.00 | EXTENDED_RELEASE_TABLET | ORAL | Status: DC
Start: 2017-02-23 — End: 2017-02-23

## 2017-02-23 MED ORDER — DEXTROSE 50 % IV SOLN
12.50 | INTRAVENOUS | Status: DC
Start: ? — End: 2017-02-23

## 2017-02-23 MED ORDER — SODIUM CHLORIDE 3 % IN NEBU
3.00 | INHALATION_SOLUTION | RESPIRATORY_TRACT | Status: DC
Start: 2017-02-23 — End: 2017-02-23

## 2017-02-23 MED ORDER — ACETAMINOPHEN 325 MG PO TABS
975.00 | ORAL_TABLET | ORAL | Status: DC
Start: 2017-02-23 — End: 2017-02-23

## 2017-02-23 MED ORDER — MELATONIN 3 MG PO TABS
3.00 | ORAL_TABLET | ORAL | Status: DC
Start: 2017-02-23 — End: 2017-02-23

## 2017-02-23 MED ORDER — GENERIC EXTERNAL MEDICATION
6.25 | Status: DC
Start: 2017-02-23 — End: 2017-02-23

## 2017-02-23 MED ORDER — QUETIAPINE FUMARATE 25 MG PO TABS
25.00 | ORAL_TABLET | ORAL | Status: DC
Start: 2017-02-23 — End: 2017-02-23

## 2017-02-23 MED ORDER — IPRATROPIUM-ALBUTEROL 0.5-2.5 (3) MG/3ML IN SOLN
3.00 | RESPIRATORY_TRACT | Status: DC
Start: ? — End: 2017-02-23

## 2017-02-23 MED ORDER — BUDESONIDE-FORMOTEROL FUMARATE 160-4.5 MCG/ACT IN AERO
2.00 | INHALATION_SPRAY | RESPIRATORY_TRACT | Status: DC
Start: 2017-02-23 — End: 2017-02-23

## 2017-02-23 MED ORDER — APIXABAN 5 MG PO TABS
5.00 | ORAL_TABLET | ORAL | Status: DC
Start: 2017-02-24 — End: 2017-02-23

## 2017-02-23 MED ORDER — AMIODARONE HCL 200 MG PO TABS
200.00 | ORAL_TABLET | ORAL | Status: DC
Start: 2017-02-24 — End: 2017-02-23

## 2017-02-23 MED ORDER — GLUCAGON HCL RDNA (DIAGNOSTIC) 1 MG IJ SOLR
1.00 | INTRAMUSCULAR | Status: DC
Start: ? — End: 2017-02-23

## 2017-02-23 MED ORDER — PANTOPRAZOLE SODIUM 40 MG PO TBEC
40.00 | DELAYED_RELEASE_TABLET | ORAL | Status: DC
Start: 2017-02-24 — End: 2017-02-23

## 2017-02-23 MED ORDER — GUAIFENESIN ER 600 MG PO TB12
600.00 | ORAL_TABLET | ORAL | Status: DC
Start: 2017-02-23 — End: 2017-02-23

## 2017-03-08 ENCOUNTER — Encounter: Payer: Medicare Other | Admitting: *Deleted

## 2017-03-08 ENCOUNTER — Telehealth: Payer: Self-pay | Admitting: Cardiology

## 2017-03-08 NOTE — Telephone Encounter (Signed)
LMOVM reminding pt to send remote transmission.   

## 2017-03-12 ENCOUNTER — Emergency Department: Payer: Medicare Other

## 2017-03-12 ENCOUNTER — Encounter: Payer: Self-pay | Admitting: Emergency Medicine

## 2017-03-12 ENCOUNTER — Inpatient Hospital Stay
Admission: EM | Admit: 2017-03-12 | Discharge: 2017-03-14 | DRG: 292 | Disposition: A | Discharge status: Home Health | Payer: Medicare Other | Attending: Internal Medicine | Admitting: Internal Medicine

## 2017-03-12 ENCOUNTER — Other Ambulatory Visit: Payer: Self-pay

## 2017-03-12 DIAGNOSIS — Z7982 Long term (current) use of aspirin: Secondary | ICD-10-CM | POA: Diagnosis not present

## 2017-03-12 DIAGNOSIS — Z951 Presence of aortocoronary bypass graft: Secondary | ICD-10-CM | POA: Diagnosis not present

## 2017-03-12 DIAGNOSIS — E785 Hyperlipidemia, unspecified: Secondary | ICD-10-CM | POA: Diagnosis present

## 2017-03-12 DIAGNOSIS — I495 Sick sinus syndrome: Secondary | ICD-10-CM | POA: Diagnosis present

## 2017-03-12 DIAGNOSIS — Z87891 Personal history of nicotine dependence: Secondary | ICD-10-CM

## 2017-03-12 DIAGNOSIS — R0602 Shortness of breath: Secondary | ICD-10-CM

## 2017-03-12 DIAGNOSIS — F419 Anxiety disorder, unspecified: Secondary | ICD-10-CM | POA: Diagnosis present

## 2017-03-12 DIAGNOSIS — I2510 Atherosclerotic heart disease of native coronary artery without angina pectoris: Secondary | ICD-10-CM | POA: Diagnosis present

## 2017-03-12 DIAGNOSIS — J441 Chronic obstructive pulmonary disease with (acute) exacerbation: Secondary | ICD-10-CM | POA: Diagnosis present

## 2017-03-12 DIAGNOSIS — K219 Gastro-esophageal reflux disease without esophagitis: Secondary | ICD-10-CM | POA: Diagnosis present

## 2017-03-12 DIAGNOSIS — Z950 Presence of cardiac pacemaker: Secondary | ICD-10-CM | POA: Diagnosis not present

## 2017-03-12 DIAGNOSIS — R748 Abnormal levels of other serum enzymes: Secondary | ICD-10-CM | POA: Diagnosis present

## 2017-03-12 DIAGNOSIS — F329 Major depressive disorder, single episode, unspecified: Secondary | ICD-10-CM | POA: Diagnosis present

## 2017-03-12 DIAGNOSIS — Z8546 Personal history of malignant neoplasm of prostate: Secondary | ICD-10-CM | POA: Diagnosis not present

## 2017-03-12 DIAGNOSIS — Z85048 Personal history of other malignant neoplasm of rectum, rectosigmoid junction, and anus: Secondary | ICD-10-CM | POA: Diagnosis not present

## 2017-03-12 DIAGNOSIS — I252 Old myocardial infarction: Secondary | ICD-10-CM

## 2017-03-12 DIAGNOSIS — Z933 Colostomy status: Secondary | ICD-10-CM | POA: Diagnosis not present

## 2017-03-12 DIAGNOSIS — Z79899 Other long term (current) drug therapy: Secondary | ICD-10-CM | POA: Diagnosis not present

## 2017-03-12 DIAGNOSIS — Z955 Presence of coronary angioplasty implant and graft: Secondary | ICD-10-CM

## 2017-03-12 DIAGNOSIS — J810 Acute pulmonary edema: Secondary | ICD-10-CM

## 2017-03-12 DIAGNOSIS — I5023 Acute on chronic systolic (congestive) heart failure: Secondary | ICD-10-CM | POA: Diagnosis present

## 2017-03-12 DIAGNOSIS — Z96651 Presence of right artificial knee joint: Secondary | ICD-10-CM | POA: Diagnosis present

## 2017-03-12 DIAGNOSIS — I110 Hypertensive heart disease with heart failure: Principal | ICD-10-CM | POA: Diagnosis present

## 2017-03-12 DIAGNOSIS — I480 Paroxysmal atrial fibrillation: Secondary | ICD-10-CM | POA: Diagnosis present

## 2017-03-12 DIAGNOSIS — Z7901 Long term (current) use of anticoagulants: Secondary | ICD-10-CM | POA: Diagnosis not present

## 2017-03-12 DIAGNOSIS — R079 Chest pain, unspecified: Secondary | ICD-10-CM

## 2017-03-12 DIAGNOSIS — I739 Peripheral vascular disease, unspecified: Secondary | ICD-10-CM | POA: Diagnosis present

## 2017-03-12 DIAGNOSIS — I509 Heart failure, unspecified: Secondary | ICD-10-CM

## 2017-03-12 DIAGNOSIS — Z7951 Long term (current) use of inhaled steroids: Secondary | ICD-10-CM

## 2017-03-12 LAB — COMPREHENSIVE METABOLIC PANEL
ALBUMIN: 3.5 g/dL (ref 3.5–5.0)
ALT: 20 U/L (ref 17–63)
AST: 27 U/L (ref 15–41)
Alkaline Phosphatase: 142 U/L — ABNORMAL HIGH (ref 38–126)
Anion gap: 10 (ref 5–15)
BILIRUBIN TOTAL: 0.5 mg/dL (ref 0.3–1.2)
BUN: 30 mg/dL — AB (ref 6–20)
CHLORIDE: 104 mmol/L (ref 101–111)
CO2: 22 mmol/L (ref 22–32)
CREATININE: 1.14 mg/dL (ref 0.61–1.24)
Calcium: 8.9 mg/dL (ref 8.9–10.3)
GFR calc Af Amer: >60 mL/min (ref >60)
GLUCOSE: 164 mg/dL — AB (ref 65–99)
POTASSIUM: 4.5 mmol/L (ref 3.5–5.1)
Sodium: 136 mmol/L (ref 135–145)
TOTAL PROTEIN: 7.3 g/dL (ref 6.5–8.1)

## 2017-03-12 LAB — CBC WITH DIFFERENTIAL/PLATELET
BASOS PCT: 0 %
Basophils Absolute: 0 10*3/uL (ref 0–0.1)
Eosinophils Absolute: 1 10*3/uL — ABNORMAL HIGH (ref 0–0.7)
Eosinophils Relative: 9 %
HEMATOCRIT: 34 % — AB (ref 40.0–52.0)
HEMOGLOBIN: 11.4 g/dL — AB (ref 13.0–18.0)
LYMPHS ABS: 0.5 10*3/uL — AB (ref 1.0–3.6)
Lymphocytes Relative: 5 %
MCH: 31.8 pg (ref 26.0–34.0)
MCHC: 33.5 g/dL (ref 32.0–36.0)
MCV: 94.9 fL (ref 80.0–100.0)
MONO ABS: 0.5 10*3/uL (ref 0.2–1.0)
MONOS PCT: 5 %
NEUTROS ABS: 8.5 10*3/uL — AB (ref 1.4–6.5)
NEUTROS PCT: 81 %
Platelets: 276 10*3/uL (ref 150–440)
RBC: 3.59 MIL/uL — ABNORMAL LOW (ref 4.40–5.90)
RDW: 14.7 % — AB (ref 11.5–14.5)
WBC: 10.6 10*3/uL (ref 3.8–10.6)

## 2017-03-12 LAB — TSH: TSH: 10.191 u[IU]/mL — ABNORMAL HIGH (ref 0.350–4.500)

## 2017-03-12 LAB — TROPONIN I
Troponin I: 0.04 ng/mL (ref <0.03)
Troponin I: 0.29 ng/mL (ref <0.03)
Troponin I: 0.36 ng/mL (ref <0.03)

## 2017-03-12 LAB — PROTIME-INR
INR: 1.11
Prothrombin Time: 14.2 seconds (ref 11.4–15.2)

## 2017-03-12 LAB — BRAIN NATRIURETIC PEPTIDE: B NATRIURETIC PEPTIDE 5: 536 pg/mL — AB (ref 0.0–100.0)

## 2017-03-12 MED ORDER — ONDANSETRON HCL 4 MG/2ML IJ SOLN
4.0000 mg | Freq: Once | INTRAMUSCULAR | Status: AC
  Administered 2017-03-12: 4 mg via INTRAVENOUS
  Filled 2017-03-12: qty 2

## 2017-03-12 MED ORDER — SODIUM CHLORIDE 0.9% FLUSH: 3.0000 mL | INTRAVENOUS | Status: DC | PRN

## 2017-03-12 MED ORDER — ISOSORBIDE MONONITRATE ER 60 MG PO TB24
120.0000 mg | ORAL_TABLET | Freq: Every day | ORAL | Status: DC
  Administered 2017-03-13 – 2017-03-14 (×2): 120 mg via ORAL
  Filled 2017-03-12 (×2): qty 2

## 2017-03-12 MED ORDER — MORPHINE SULFATE (PF) 4 MG/ML IV SOLN
4.0000 mg | Freq: Once | INTRAVENOUS | Status: AC
  Administered 2017-03-12: 4 mg via INTRAVENOUS
  Filled 2017-03-12: qty 1

## 2017-03-12 MED ORDER — MECLIZINE HCL 25 MG PO TABS
12.5000 mg | ORAL_TABLET | Freq: Three times a day (TID) | ORAL | Status: DC | PRN
  Administered 2017-03-12: 25 mg via ORAL
  Filled 2017-03-12 (×2): qty 1

## 2017-03-12 MED ORDER — ONDANSETRON HCL 4 MG PO TABS: 4.0000 mg | ORAL_TABLET | Freq: Four times a day (QID) | ORAL | Status: DC | PRN

## 2017-03-12 MED ORDER — ORAL CARE MOUTH RINSE
15.0000 mL | Freq: Two times a day (BID) | OROMUCOSAL | Status: DC
  Administered 2017-03-12 – 2017-03-14 (×2): 15 mL via OROMUCOSAL
  Filled 2017-03-12 (×2): qty 15

## 2017-03-12 MED ORDER — ASPIRIN EC 81 MG PO TBEC
81.0000 mg | DELAYED_RELEASE_TABLET | Freq: Every day | ORAL | Status: DC
  Administered 2017-03-12 – 2017-03-14 (×3): 81 mg via ORAL
  Filled 2017-03-12 (×3): qty 1

## 2017-03-12 MED ORDER — AMIODARONE HCL 200 MG PO TABS
200.0000 mg | ORAL_TABLET | Freq: Every day | ORAL | Status: DC
  Administered 2017-03-12 – 2017-03-14 (×3): 200 mg via ORAL
  Filled 2017-03-12 (×3): qty 1

## 2017-03-12 MED ORDER — BUDESONIDE 0.5 MG/2ML IN SUSP
0.5000 mg | Freq: Two times a day (BID) | RESPIRATORY_TRACT | Status: DC
  Filled 2017-03-12: qty 2

## 2017-03-12 MED ORDER — PAROXETINE HCL 30 MG PO TABS
30.0000 mg | ORAL_TABLET | Freq: Every day | ORAL | Status: DC
  Administered 2017-03-12 – 2017-03-14 (×3): 30 mg via ORAL
  Filled 2017-03-12 (×3): qty 1

## 2017-03-12 MED ORDER — NITROGLYCERIN 0.4 MG SL SUBL: 0.4000 mg | SUBLINGUAL_TABLET | SUBLINGUAL | Status: DC | PRN

## 2017-03-12 MED ORDER — FUROSEMIDE 10 MG/ML IJ SOLN
20.0000 mg | Freq: Once | INTRAMUSCULAR | Status: AC
  Administered 2017-03-12: 20 mg via INTRAVENOUS
  Filled 2017-03-12: qty 4

## 2017-03-12 MED ORDER — GUAIFENESIN ER 600 MG PO TB12
600.0000 mg | ORAL_TABLET | Freq: Two times a day (BID) | ORAL | Status: DC
  Administered 2017-03-12 – 2017-03-14 (×4): 600 mg via ORAL
  Filled 2017-03-12 (×5): qty 1

## 2017-03-12 MED ORDER — SODIUM CHLORIDE 0.9% FLUSH
3.0000 mL | Freq: Two times a day (BID) | INTRAVENOUS | Status: DC
  Administered 2017-03-12 – 2017-03-14 (×5): 3 mL via INTRAVENOUS

## 2017-03-12 MED ORDER — METHYLPREDNISOLONE SODIUM SUCC 125 MG IJ SOLR
125.0000 mg | Freq: Once | INTRAMUSCULAR | Status: AC
  Administered 2017-03-12: 125 mg via INTRAVENOUS
  Filled 2017-03-12: qty 2

## 2017-03-12 MED ORDER — BUDESONIDE 0.25 MG/2ML IN SUSP
0.2500 mg | Freq: Two times a day (BID) | RESPIRATORY_TRACT | Status: DC
  Administered 2017-03-12 – 2017-03-13 (×2): 0.25 mg via RESPIRATORY_TRACT
  Filled 2017-03-12 (×2): qty 2

## 2017-03-12 MED ORDER — METOPROLOL TARTRATE 25 MG PO TABS
12.5000 mg | ORAL_TABLET | Freq: Two times a day (BID) | ORAL | Status: DC
  Administered 2017-03-12 – 2017-03-13 (×3): 12.5 mg via ORAL
  Filled 2017-03-12 (×3): qty 1

## 2017-03-12 MED ORDER — HYDROCODONE-ACETAMINOPHEN 5-325 MG PO TABS
1.0000 | ORAL_TABLET | ORAL | Status: DC | PRN
  Administered 2017-03-12 – 2017-03-14 (×5): 2 via ORAL
  Filled 2017-03-12 (×5): qty 2

## 2017-03-12 MED ORDER — FUROSEMIDE 10 MG/ML IJ SOLN
40.0000 mg | Freq: Two times a day (BID) | INTRAMUSCULAR | Status: DC
  Administered 2017-03-13 (×4): 40 mg via INTRAVENOUS
  Filled 2017-03-12 (×4): qty 4

## 2017-03-12 MED ORDER — MORPHINE SULFATE (PF) 2 MG/ML IV SOLN: 2.0000 mg | INTRAVENOUS | Status: DC | PRN

## 2017-03-12 MED ORDER — VITAMIN D 1000 UNITS PO TABS
2000.0000 [IU] | ORAL_TABLET | Freq: Every day | ORAL | Status: DC
  Administered 2017-03-12 – 2017-03-14 (×3): 2000 [IU] via ORAL
  Filled 2017-03-12 (×3): qty 2

## 2017-03-12 MED ORDER — ATORVASTATIN CALCIUM 20 MG PO TABS
80.0000 mg | ORAL_TABLET | Freq: Every day | ORAL | Status: DC
  Administered 2017-03-12 – 2017-03-13 (×2): 80 mg via ORAL
  Filled 2017-03-12 (×2): qty 4

## 2017-03-12 MED ORDER — SODIUM CHLORIDE 0.9 % IV SOLN: 250.0000 mL | INTRAVENOUS | Status: DC | PRN

## 2017-03-12 MED ORDER — ARFORMOTEROL TARTRATE 15 MCG/2ML IN NEBU
15.0000 ug | INHALATION_SOLUTION | Freq: Two times a day (BID) | RESPIRATORY_TRACT | Status: DC
  Administered 2017-03-12 – 2017-03-13 (×2): 15 ug via RESPIRATORY_TRACT
  Filled 2017-03-12 (×2): qty 2

## 2017-03-12 MED ORDER — ONDANSETRON HCL 4 MG/2ML IJ SOLN: 4.0000 mg | Freq: Four times a day (QID) | INTRAMUSCULAR | Status: DC | PRN

## 2017-03-12 MED ORDER — PANTOPRAZOLE SODIUM 40 MG PO TBEC
40.0000 mg | DELAYED_RELEASE_TABLET | Freq: Every day | ORAL | Status: DC
  Administered 2017-03-12 – 2017-03-14 (×3): 40 mg via ORAL
  Filled 2017-03-12 (×3): qty 1

## 2017-03-12 MED ORDER — IPRATROPIUM-ALBUTEROL 0.5-2.5 (3) MG/3ML IN SOLN
3.0000 mL | RESPIRATORY_TRACT | Status: DC
  Administered 2017-03-12 – 2017-03-14 (×11): 3 mL via RESPIRATORY_TRACT
  Filled 2017-03-12 (×13): qty 3

## 2017-03-12 MED ORDER — APIXABAN 5 MG PO TABS
5.0000 mg | ORAL_TABLET | Freq: Two times a day (BID) | ORAL | Status: DC
  Administered 2017-03-12 – 2017-03-14 (×5): 5 mg via ORAL
  Filled 2017-03-12 (×5): qty 1

## 2017-03-12 MED ORDER — ENOXAPARIN SODIUM 40 MG/0.4ML ~~LOC~~ SOLN: 40.0000 mg | SUBCUTANEOUS | Status: DC

## 2017-03-12 MED ORDER — TIOTROPIUM BROMIDE MONOHYDRATE 18 MCG IN CAPS
1.0000 | ORAL_CAPSULE | Freq: Two times a day (BID) | RESPIRATORY_TRACT | Status: DC
  Administered 2017-03-13: 18 ug via RESPIRATORY_TRACT
  Filled 2017-03-12: qty 5

## 2017-03-12 MED ORDER — ACETAMINOPHEN 650 MG RE SUPP: 650.0000 mg | Freq: Four times a day (QID) | RECTAL | Status: DC | PRN

## 2017-03-12 MED ORDER — METHYLPREDNISOLONE SODIUM SUCC 125 MG IJ SOLR
60.0000 mg | Freq: Four times a day (QID) | INTRAMUSCULAR | Status: DC
  Administered 2017-03-12 – 2017-03-13 (×3): 60 mg via INTRAVENOUS
  Filled 2017-03-12 (×4): qty 2

## 2017-03-12 MED ORDER — ACETAMINOPHEN 325 MG PO TABS: 650.0000 mg | ORAL_TABLET | Freq: Four times a day (QID) | ORAL | Status: DC | PRN

## 2017-03-12 NOTE — ED Triage Notes (Signed)
Pt arrived via ems from home with complaints of shortness of breath that started today..Pt has history of COPD and recently has heart surgery at [facility] and was discharged Friday. EMS administered 2 duo nebs in route. Upon arrival pt's breathing labored and wheezing heard on auscultation.

## 2017-03-12 NOTE — ED Notes (Addendum)
Pt resting at this time. Aware that we are holding him until a bed becomes available.

## 2017-03-12 NOTE — H&P (Addendum)
[facility] NAME: Donald Juarez    MR#:  [id number]  DATE OF BIRTH:  [birth year]  DATE OF ADMISSION:  03/12/2017  PRIMARY CARE PHYSICIAN: Sofie Hartigan, MD   REQUESTING/REFERRING PHYSICIAN: Darel Hong MD   CHIEF COMPLAINT:   Chief Complaint  Patient presents with  . Shortness of Breath    HISTORY OF PRESENT ILLNESS: Donald Juarez  is a 72 y.o. male with a known history of Neri artery disease, essential hypertension, peripheral vascular disease, COPD,  hyperlipidemia and prostate cancer who was just discharged from [facility] last Friday after he was there and had a bypass.  Patient states that he was doing fine until this morning when all of a sudden he started having shortness of breath and started having left-sided chest pain radiating to his left arm.  He continues to have those symptoms states that someone is sitting on his chest.  Patient was noted to have a chest x-ray suggestive of congestive heart failure. PAST MEDICAL HISTORY:   Past Medical History:  Diagnosis Date  . Anginal pain ([place])    feels this as jaw pain.  takes imdur for this. rarely uses nitro.  [name] Anxiety   . Arthritis   . Asthma   . Bilateral claudication of lower limb ([place])   . Carotid stenosis    a. 07/2013 Carotid U/S: 100% RICA (pt prev reported h/o 17%), LICA 51-02%, bilat ECA 50%, normal vertebrals and subclavians bilat-->F/U needed in 01/2014.  . Cataract of left eye   . Cataract, right    a. 10/2010 s/p cataract surgery   . COPD (chronic obstructive pulmonary disease) ([place])   . Coronary artery disease    a. 06/1996 & 03/2005 Cath: non-obs dzs;  b. 07/2009 NSTEMI/Cath: LCX 99->PCI/DES extending into OM1;  c. 01/2013 Cath: patent LCX stent-->Med Rx.; d. cath 03/2014: RPAV dzs->Med rx; e. 10/2014 Cath/PCI: LM 30, LAD 20p/m, LCX 50ost/p, 10 ISR, 60d, OM1 min irregs, OM2 50, RCA 30ost, 54m, 60d, RPDA 70(2.5x8 Xience DES), RPAV 90small(PTCA); f.  10/2014 Relook Cath: RPAV 99(Med Rx), EF 45-50.  [name] Dysrhythmia    since ablation, flutter resolved.  . Essential hypertension   . Hyperkalemia   . Hyperlipidemia   . Myocardial infarction ([place]) 2015   had stent inserted and then had heart attack after. stent inserted due to blockage  . Paroxysmal atrial flutter ([place])    a. CHA2DS2VASc = 3 -> eliquis;  b. 07/2013 Echo: EF 50-55%, mildly dil LA.c. s/p RFCA 10/31/2013 by Dr Caryl Comes  . Prostate cancer ([place])   . Pulmonary nodule, right    a. 0.7cm RLL - stable by CT 06/2013.  [name] Rectal cancer ([place])    a. Status post colostomy  . Seizures ([place])    a. last 30 years ago  . Shingles    a. 09/2012.  . SSS (sick sinus syndrome) ([place])    a. 12/2009 S/P MDT [place] ADDR01 DC PPM, ser # [id number] ([name]).  . Syncope and collapse     PAST SURGICAL HISTORY:  Past Surgical History:  Procedure Laterality Date  . ABLATION  10/31/2013   CTI ablation by Dr Caryl Comes for atrial flutter  . ATRIAL FLUTTER ABLATION N/A 10/31/2013   Procedure: ATRIAL FLUTTER ABLATION;  Surgeon: [name], MD;  Location[facility] CATH LAB;  Service: Cardiovascular;  Laterality: N/A;  . CARDIAC CATHETERIZATION  07[facility]   [facility]  . CARDIAC CATHETERIZATION  01/2013   [facility]  .  CARDIAC CATHETERIZATION  03/2014   [facility]  . CARDIAC CATHETERIZATION N/A 11/02/2014   Procedure: Left Heart Cath and Cors/Grafts Angiography;  Surgeon: [name], MD;  Location: [place] CV LAB;  Service: Cardiovascular;  Laterality: N/A;  . CARDIAC CATHETERIZATION N/A 11/02/2014   Procedure: Coronary Stent Intervention;  Surgeon: [name], MD;  Location: [place] CV LAB;  Service: Cardiovascular;  Laterality: N/A;  . CARDIAC CATHETERIZATION N/A 11/03/2014   Procedure: Left Heart Cath and Coronary Angiography;  Surgeon: [name], MD;  Location: [place] CV LAB;  Service: Cardiovascular;  Laterality: N/A;  . CARDIAC SURGERY  2011   Stents placed   . CATARACT EXTRACTION Left   .  CATARACT EXTRACTION W/PHACO Left 06/01/2014   Procedure: CATARACT EXTRACTION PHACO AND INTRAOCULAR LENS PLACEMENT (IOC);  Surgeon: [name], MD;  Location: [name] ORS;  Service: Ophthalmology;  Laterality: Left;  . cataract surgery  2012  . colon surgery     cancer removal  . COLON SURGERY     colostomy  . COLONOSCOPY WITH PROPOFOL N/A 05/24/2016   Procedure: COLONOSCOPY WITH PROPOFOL;  Surgeon: [name], MD;  Location: [facility] ENDOSCOPY;  Service: Endoscopy;  Laterality: N/A;  . CORONARY ANGIOPLASTY    . INSERT / REPLACE / REMOVE PACEMAKER  2011   just a pacer  . KNEE ARTHROSCOPY Right 1980's  . LEFT HEART CATH AND CORONARY ANGIOGRAPHY N/A 02/08/2017   Procedure: LEFT HEART CATH AND CORONARY ANGIOGRAPHY;  Surgeon: [name], MD;  Location: [place] CV LAB;  Service: Cardiovascular;  Laterality: N/A;  . PACEMAKER PLACEMENT  01/17/2010  . TOTAL KNEE ARTHROPLASTY Right 01/14/2016   Procedure: TOTAL KNEE ARTHROPLASTY;  Surgeon: [name], MD;  Location: [name] ORS;  Service: Orthopedics;  Laterality: Right;    SOCIAL HISTORY:  Social History   Tobacco Use  . Smoking status: Former Smoker    Packs/day: 1.00    Years: 30.00    Pack years: 30.00    Types: Cigarettes    Last attempt to quit: 07/26/2008    Years since quitting: 8.6  . Smokeless tobacco: Never Used  Substance Use Topics  . Alcohol use: No    FAMILY HISTORY:  Family History  Problem Relation Age of Onset  . Cancer Mother        'male cancer"  . Hypertension Mother   . Hyperlipidemia Mother   . Heart disease Father   . Heart attack Father   . Hypertension Father   . Hyperlipidemia Father     DRUG ALLERGIES: No Known Allergies  REVIEW OF SYSTEMS:   CONSTITUTIONAL: No fever, fatigue or weakness.  EYES: No blurred or double vision.  EARS, NOSE, AND THROAT: No tinnitus or ear pain.  RESPIRATORY: No cough, positive shortness of breath, positive wheezing or hemoptysis.   CARDIOVASCULAR: Positive chest pain, orthopnea, edema.  GASTROINTESTINAL: No nausea, vomiting, diarrhea or abdominal pain.  GENITOURINARY: No dysuria, hematuria.  ENDOCRINE: No polyuria, nocturia,  HEMATOLOGY: No anemia, easy bruising or bleeding SKIN: No rash or lesion. MUSCULOSKELETAL: No joint pain or arthritis.   NEUROLOGIC: No tingling, numbness, weakness.  PSYCHIATRY: No anxiety or depression.   MEDICATIONS AT HOME:  Prior to Admission medications   Medication Sig Start Date End Date Taking? Authorizing Provider  albuterol (PROAIR HFA) 108 (90 Base) MCG/ACT inhaler Inhale 2 puffs into the lungs every 4 (four) hours as needed for wheezing or shortness of breath. 07/11/16   Flora Lipps, MD  albuterol (PROVENTIL) (2.5  MG/3ML) 0.083% nebulizer solution Take 3 mLs (2.5 mg total) by nebulization every 6 (six) hours as needed for wheezing or shortness of breath. DX:J44.9 10/04/16   Flora Lipps, MD  arformoterol (BROVANA) 15 MCG/2ML NEBU Take 2 mLs (15 mcg total) by nebulization 2 (two) times daily. Take 2 mLs (15 mcg total) by nebulization 2 (two) times daily. DX code 493.20 Every morning and 1800 07/11/16   Flora Lipps, MD  aspirin EC 81 MG tablet Take 81 mg by mouth daily.    [provider]  atorvastatin (LIPITOR) 80 MG tablet Take 1 tablet (80 mg total) by mouth daily. 05/09/16 02/07/17  Wende Bushy, MD  budesonide (PULMICORT) 0.5 MG/2ML nebulizer solution Take 2 mLs (0.5 mg total) by nebulization 2 (two) times daily. DX code 493.20 Every morning and 1800 07/11/16   Flora Lipps, MD  budesonide-formoterol [facility]) 160-4.5 MCG/ACT inhaler Inhale 2 puffs into the lungs 2 (two) times daily.    [provider]  Cholecalciferol (VITAMIN D) 2000 UNITS tablet Take 2,000 Units by mouth daily.    [provider]  isosorbide mononitrate (IMDUR) 120 MG 24 hr tablet Take one tablet (120 mg) by mouth at bedtime Patient taking differently: Take 60 mg by mouth 2 (two) times  daily.  03/28/16   [name], MD  losartan (COZAAR) 25 MG tablet Take 25 mg by mouth daily.    [provider]  meclizine (ANTIVERT) 12.5 MG tablet Take 1-2 tablets by mouth 3 (three) times daily as needed. 01/05/17   [provider]  metoprolol tartrate (LOPRESSOR) 25 MG tablet TAKE ONE TABLET BY MOUTH IN THE MORNING AND TAKE TWO TABLETS BY MOUTHIN THE EVENING. Patient taking differently: Take 25-50 mg by mouth 2 (two) times daily. TAKE ONE TABLET BY MOUTH IN THE MORNING AND TAKE TWO TABLETS BY MOUTHIN THE EVENING. 01/24/17   [name], MD  nitroGLYCERIN (NITROGLYN) 2 % ointment Apply 1 inch topically every 6 (six) hours. 02/09/17   Epifanio Lesches, MD  nitroGLYCERIN (NITROSTAT) 0.4 MG SL tablet Place 1 tablet (0.4 mg total) under the tongue every 5 (five) minutes as needed for chest pain. 05/09/16   Wende Bushy, MD  omeprazole (PRILOSEC) 40 MG capsule Take 40 mg by mouth daily.    [provider]  PARoxetine (PAXIL) 30 MG tablet Take 30 mg by mouth daily.     [provider]  ranolazine (RANEXA) 500 MG 12 hr tablet Take 1,000 mg by mouth 2 (two) times daily.     [provider]  Tiotropium Bromide Monohydrate (SPIRIVA RESPIMAT) 1.25 MCG/ACT AERS Inhale 2 puffs into the lungs 2 (two) times daily. Patient not taking: Reported on 02/07/2017 07/11/16   Flora Lipps, MD  traMADol (ULTRAM) 50 MG tablet Take 1-2 tablets (50-100 mg total) by mouth every 4 (four) hours as needed for moderate pain. Patient taking differently: Take 50 mg by mouth every 6 (six) hours as needed for moderate pain.  01/16/16   Duanne Guess, PA-C      PHYSICAL EXAMINATION:   VITAL SIGNS: Blood pressure 126/70, pulse 75, temperature (!) 97.4 F (36.3 C), temperature source Axillary, resp. rate 17, height 5' 9.5" (1.765 m), weight 216 lb 8 oz (98.2 kg), SpO2 96 %.  GENERAL:  72 y.o.-year-old patient lying in the bed with no acute distress.  EYES: Pupils equal,  round, reactive to light and accommodation. No scleral icterus. Extraocular muscles intact.  HEENT: Head atraumatic, normocephalic. Oropharynx and nasopharynx clear.  NECK:  Supple, no jugular venous distention. No thyroid enlargement, no tenderness.  LUNGS: Bilateral crackles at the bases with no accessory muscle usage he also has some wheezing  CARDIOVASCULAR: S1, S2 normal. No murmurs, rubs, or gallops.  ABDOMEN: Soft, nontender, nondistended. Bowel sounds present. No organomegaly or mass.  EXTREMITIES: No pedal edema, cyanosis, or clubbing.  NEUROLOGIC: Cranial nerves II through XII are intact. Muscle strength 5/5 in all extremities. Sensation intact. Gait not checked.  PSYCHIATRIC: The patient is alert and oriented x 3.  SKIN: Erythematous rash on his feet  LABORATORY PANEL:   CBC Recent Labs  Lab 03/12/17 0945  WBC 10.6  HGB 11.4*  HCT 34.0*  PLT 276  MCV 94.9  MCH 31.8  MCHC 33.5  RDW 14.7*  LYMPHSABS 0.5*  MONOABS 0.5  EOSABS 1.0*  BASOSABS 0.0   ------------------------------------------------------------------------------------------------------------------  Chemistries  Recent Labs  Lab 03/12/17 0945  NA 136  K 4.5  CL 104  CO2 22  GLUCOSE 164*  BUN 30*  CREATININE 1.14  CALCIUM 8.9  AST 27  ALT 20  ALKPHOS 142*  BILITOT 0.5   ------------------------------------------------------------------------------------------------------------------ estimated creatinine clearance is 68.3 mL/min (by C-G formula based on SCr of 1.14 mg/dL). ------------------------------------------------------------------------------------------------------------------ No results for input(s): TSH, T4TOTAL, T3FREE, THYROIDAB in the last 72 hours.  Invalid input(s): FREET3   Coagulation profile Recent Labs  Lab 03/12/17 0945  INR 1.11   ------------------------------------------------------------------------------------------------------------------- No results for  input(s): DDIMER in the last 72 hours. -------------------------------------------------------------------------------------------------------------------  Cardiac Enzymes Recent Labs  Lab 03/12/17 0945  TROPONINI 0.04*   ------------------------------------------------------------------------------------------------------------------ Invalid input(s): POCBNP  ---------------------------------------------------------------------------------------------------------------  Urinalysis    Component Value Date/Time   COLORURINE YELLOW 09/07/2016 1001   APPEARANCEUR CLEAR 09/07/2016 1001   APPEARANCEUR Hazy 05/11/2014 0047   LABSPEC >1.030 (H) 09/07/2016 1001   LABSPEC 1.015 05/11/2014 0047   PHURINE 5.5 09/07/2016 1001   GLUCOSEU NEGATIVE 09/07/2016 1001   GLUCOSEU 150 mg/dL 05/11/2014 0047   HGBUR TRACE (A) 09/07/2016 1001   BILIRUBINUR SMALL (A) 09/07/2016 1001   BILIRUBINUR Negative 05/11/2014 0047   KETONESUR TRACE (A) 09/07/2016 1001   PROTEINUR 100 (A) 09/07/2016 1001   NITRITE NEGATIVE 09/07/2016 1001   LEUKOCYTESUR NEGATIVE 09/07/2016 1001   LEUKOCYTESUR 3+ 05/11/2014 0047     RADIOLOGY: Dg Chest Port 1 View  Result Date: 03/12/2017 CLINICAL DATA:  Onset of shortness of breath today. History of COPD and recent cardiac surgery. History of prostate and rectal cancer. EXAM: PORTABLE CHEST 1 VIEW COMPARISON:  Radiographs 02/07/2017 and 04/27/2016. FINDINGS: 0939 hr. Two views submitted. Left subclavian pacemaker leads appear unchanged within the right atrium and right ventricle. Interval median sternotomy and CABG. The heart size is stable. There is increased septal thickening at both lung bases suspicious for mild interstitial edema. There is probable mild left lower lobe atelectasis. No significant pleural effusion or pneumothorax. IMPRESSION: Interval CABG with suspected mild pulmonary edema and left lower lobe atelectasis. No pneumothorax or significant pleural effusion.  Electronically Signed   By: Richardean Sale M.D.   On: 03/12/2017 10:03    EKG: Orders placed or performed during the hospital encounter of 03/12/17  . EKG 12-Lead  . EKG 12-Lead    IMPRESSION AND PLAN: Patient 72 year old white male recently discharged after CABG presenting with chest pain shortness of breath  1.  Acute on chronic systolic CHF I will obtain echocardiogram of the heart Give him IV Lasix Cardiology evaluation  2.  Chest pain could be musculoskeletal We will cycle cardiac enzymes cardiology  evaluation  3.  Acute on chronic COPD exasperation I will place patient on duo nebs Treat with Solu-Medrol  4.  Essential hypertension occasions including metoprolol  5.  Hyperlipidemia continue cholesterol lowering regimen  6.  History of paroxysmal atrial fibrillation continue anticoagulation as doing at home    All the records are reviewed and case discussed with ED provider. Management plans discussed with the patient, family and they are in agreement.  CODE STATUS: Code Status History    Date Active Date Inactive Code Status Order ID Comments User Context   02/07/2017 19:27 02/10/2017 08:25 Full Code [id number]  Nicholes Mango, MD Inpatient   01/14/2016 11:22 01/17/2016 16:59 Full Code [id number]  [name], MD Inpatient   11/03/2014 16:40 11/04/2014 13:57 Full Code [id number]  [name], MD Inpatient   11/02/2014 13:33 11/03/2014 16:40 Full Code [id number]  [name], MD Inpatient   10/31/2013 12:08 11/01/2013 01:02 Full Code [id number]  [name], MD Inpatient       TOTAL TIME TAKING CARE OF THIS PATIENT:55 minutes.    Dustin Flock M.D on 03/12/2017 at 12:16 PM  Between 7am to 6pm - Pager - [phone]  After 6pm go to [email or web address] - password [email or web address] [facility]  [place] [facility]  Office  [phone]  CC: Primary care physician; Sofie Hartigan, MD

## 2017-03-12 NOTE — ED Provider Notes (Signed)
[facility] Emergency Department Provider Note  ____________________________________________   First MD Initiated Contact with Patient 03/12/17 [phone]     (approximate)  I have reviewed the triage vital signs and the nursing notes.   HISTORY  Chief Complaint Shortness of Breath    HPI Donald Juarez is a 72 y.o. male who comes the emergency department with 1 day of chest pain and shortness of breath.  He has a past medical history of coronary artery disease and 1 month ago had a three-vessel bypass at [facility].  He also has a history of COPD.  His chest pain was gradual onset left-sided throbbing aching radiating to his left shoulder.  Associated with nausea no vomiting.  He denies history of CHF.  He takes no diuretics.  His symptoms are worse with exertion and improved with rest.  EMS noted he was saturating in the 80s on arrival.  They gave 2 duo nebs with minimal improvement in his symptoms.  The patient's symptoms are somewhat improved with rest.  Past Medical History:  Diagnosis Date  . Anginal pain ([place])    feels this as jaw pain.  takes imdur for this. rarely uses nitro.  [name] Anxiety   . Arthritis   . Asthma   . Bilateral claudication of lower limb ([place])   . Carotid stenosis    a. 07/2013 Carotid U/S: 100% RICA (pt prev reported h/o 23%), LICA 53-61%, bilat ECA 50%, normal vertebrals and subclavians bilat-->F/U needed in 01/2014.  . Cataract of left eye   . Cataract, right    a. 10/2010 s/p cataract surgery   . COPD (chronic obstructive pulmonary disease) ([place])   . Coronary artery disease    a. 06/1996 & 03/2005 Cath: non-obs dzs;  b. 07/2009 NSTEMI/Cath: LCX 99->PCI/DES extending into OM1;  c. 01/2013 Cath: patent LCX stent-->Med Rx.; d. cath 03/2014: RPAV dzs->Med rx; e. 10/2014 Cath/PCI: LM 30, LAD 20p/m, LCX 50ost/p, 10 ISR, 60d, OM1 min irregs, OM2 50, RCA 30ost, 48m, 60d, RPDA 70(2.5x8 Xience DES), RPAV 90small(PTCA); f. 10/2014 Relook Cath: RPAV 99(Med  Rx), EF 45-50.  [name] Dysrhythmia    since ablation, flutter resolved.  . Essential hypertension   . Hyperkalemia   . Hyperlipidemia   . Myocardial infarction ([place]) 2015   had stent inserted and then had heart attack after. stent inserted due to blockage  . Paroxysmal atrial flutter ([place])    a. CHA2DS2VASc = 3 -> eliquis;  b. 07/2013 Echo: EF 50-55%, mildly dil LA.c. s/p RFCA 10/31/2013 by Dr Caryl Comes  . Prostate cancer ([place])   . Pulmonary nodule, right    a. 0.7cm RLL - stable by CT 06/2013.  [name] Rectal cancer ([place])    a. Status post colostomy  . Seizures ([place])    a. last 30 years ago  . Shingles    a. 09/2012.  . SSS (sick sinus syndrome) ([place])    a. 12/2009 S/P MDT Adapta ADDR01 DC PPM, ser # [id number] ([name]).  . Syncope and collapse     Patient Active Problem List   Diagnosis Date Noted  . Acute CHF (congestive heart failure) ([place]) 03/12/2017  . Primary localized osteoarthritis of right knee 01/14/2016  . Essential hypertension   . Hyperlipidemia   . SSS (sick sinus syndrome) ([place])   . Unstable angina ([place]) 11/03/2014  . NSTEMI (non-ST elevated myocardial infarction) ([place])   . Effort angina ([place]) 11/02/2014  . S/P PTCA (percutaneous transluminal coronary angioplasty) 11/02/2014  . Coronary artery  disease involving native coronary artery   . Prostate cancer ([place])   . Anxiety and depression 06/18/2014  . Fatigue 11/14/2013  . Typical atrial flutter ([place]) 10/31/2013  . Bilateral claudication of lower limb ([place])   . Coronary artery disease   . Paroxysmal atrial flutter ([place])   . Hypertension   . Carotid stenosis   . COPD, severe GOLD GRADE D 03/28/2013  . Solitary pulmonary nodule 03/28/2013    Past Surgical History:  Procedure Laterality Date  . ABLATION  10/31/2013   CTI ablation by Dr Caryl Comes for atrial flutter  . ATRIAL FLUTTER ABLATION N/A 10/31/2013   Procedure: ATRIAL FLUTTER ABLATION;  Surgeon: [name], MD;  Location: [facility] CATH LAB;  Service: Cardiovascular;   Laterality: N/A;  . CARDIAC CATHETERIZATION  07/2009   [facility]  . CARDIAC CAT[facility]TERIZATION  01/2013   [facility]  . CARDIAC CATHETERIZATION  03/2014   [facility]  . CARDIAC CATHETERIZATION N/A 11/02/2014   Procedure: Left Heart Cath and [place] Angiography;  Surgeon: [name], MD;  Location: [place] CV LAB;  Service: Cardiovascular;  Laterality: N/A;  . CARDIAC CATHETERIZATION N/A 11/02/2014   Procedure: Coronary Stent Intervention;  Surgeon: [name], MD;  Location: [place] CV LAB;  Service: Cardiovascular;  Laterality: N/A;  . CARDIAC CATHETERIZATION N/A 11/03/2014   Procedure: Left Heart Cath and Coronary Angiography;  Surgeon: [name], MD;  Location: [place] CV LAB;  Service: Cardiovascular;  Laterality: N/A;  . CARDIAC SURGERY  2011   Stents placed   . CATARACT EXTRACTION Left   . CATARACT EXTRACTION W/PHACO Left 06/01/2014   Procedure: CATARACT EXTRACTION PHACO AND INTRAOCULAR LENS PLACEMENT (IOC);  Surgeon: [name], MD;  Location: [name] ORS;  Service: Ophthalmology;  Laterality: Left;  . cataract surgery  2012  . colon surgery     cancer removal  . COLON SURGERY     colostomy  . COLONOSCOPY WITH PROPOFOL N/A 05/24/2016   Procedure: COLONOSCOPY WITH PROPOFOL;  Surgeon: [name], MD;  Location: [name] ENDOSCOPY;  Service: Endoscopy;  Laterality: N/A;  . CORONARY ANGIOPLASTY    . INSERT / REPLACE / REMOVE PACEMAKER  2011   just a pacer  . KNEE ARTHROSCOPY Right 1980's  . LEFT HEART CATH AND CORONARY ANGIOGRAPHY N/A 02/08/2017   Procedure: LEFT HEART CATH AND CORONARY ANGIOGRAPHY;  Surgeon: [name], MD;  Location: [place] CV LAB;  Service: Cardiovascular;  Laterality: N/A;  . PACEMAKER PLACEMENT  01/17/2010  . TOTAL KNEE ARTHROPLASTY Right 01/14/2016   Procedure: TOTAL KNEE ARTHROPLASTY;  Surgeon: [name], MD;  Location: [name] ORS;  Service: Orthopedics;  Laterality: Right;    Prior to Admission medications     Medication Sig Start Date End Date Taking? Authorizing Provider  albuterol (PROAIR HFA) 108 (90 Base) MCG/ACT inhaler Inhale 2 puffs into the lungs every 4 (four) hours as needed for wheezing or shortness of breath. 07/11/16  Yes Flora Lipps, MD  albuterol (PROVENTIL) (2.5 MG/3ML) 0.083% nebulizer solution Take 3 mLs (2.5 mg total) by nebulization every 6 (six) hours as needed for wheezing or shortness of breath. DX:J44.9 10/04/16  Yes Flora Lipps, MD  amiodarone (PACERONE) 200 MG tablet Take 200 mg by mouth daily. 02/26/17 03/28/17 Yes [provider]  apixaban (ELIQUIS) 5 MG TABS tablet Take 5 mg by mouth 2 (two) times daily. 02/26/17  Yes [provider]  arformoterol (BROVANA) 15 MCG/2ML NEBU Take 2 mLs (15 mcg total) by nebulization 2 (two) times  daily. Take 2 mLs (15 mcg total) by nebulization 2 (two) times daily. DX code 493.20 Every morning and 1800 07/11/16  Yes Flora Lipps, MD  aspirin EC 81 MG tablet Take 81 mg by mouth daily.   Yes [provider]  atorvastatin (LIPITOR) 80 MG tablet Take 1 tablet (80 mg total) by mouth daily. 05/09/16 03/12/18 Yes Wende Bushy, MD  budesonide (PULMICORT) 0.5 MG/2ML nebulizer solution Take 2 mLs (0.5 mg total) by nebulization 2 (two) times daily. DX code 493.20 Every morning and 1800 07/11/16  Yes Kasa, Maretta Bees, MD  budesonide-formoterol (SYMBICORT) 160-4.5 MCG/ACT inhaler Inhale 2 puffs into the lungs 2 (two) times daily.   Yes [provider]  Cholecalciferol (VITAMIN D) 2000 UNITS tablet Take 2,000 Units by mouth daily.   Yes [provider]  meclizine (ANTIVERT) 12.5 MG tablet Take 1-2 tablets by mouth 3 (three) times daily as needed. 01/05/17  Yes [provider]  metoprolol tartrate (LOPRESSOR) 25 MG tablet TAKE ONE TABLET BY MOUTH IN THE MORNING AND TAKE TWO TABLETS BY MOUTHIN THE EVENING. Patient taking differently: Take 12.5 mg by mouth 2 (two) times daily. TAKE ONE TABLET BY MOUTH IN THE MORNING AND  TAKE TWO TABLETS BY MOUTHIN THE EVENING. 01/24/17  Yes [name], MD  nitroGLYCERIN (NITROSTAT) 0.4 MG SL tablet Place 1 tablet (0.4 mg total) under the tongue every 5 (five) minutes as needed for chest pain. 05/09/16  Yes Wende Bushy, MD  PARoxetine (PAXIL) 30 MG tablet Take 30 mg by mouth daily.    Yes [provider]  Tiotropium Bromide Monohydrate (SPIRIVA RESPIMAT) 1.25 MCG/ACT AERS Inhale 2 puffs into the lungs 2 (two) times daily. 07/11/16  Yes Flora Lipps, MD  traMADol (ULTRAM) 50 MG tablet Take 1-2 tablets (50-100 mg total) by mouth every 4 (four) hours as needed for moderate pain. Patient taking differently: Take 50 mg by mouth every 6 (six) hours as needed for moderate pain.  01/16/16  Yes Duanne Guess, PA-C  isosorbide mononitrate (IMDUR) 120 MG 24 hr tablet Take one tablet (120 mg) by mouth at bedtime Patient not taking: Reported on 03/12/2017 03/28/16   [name], MD  nitroGLYCERIN (NITROGLYN) 2 % ointment Apply 1 inch topically every 6 (six) hours. Patient not taking: Reported on 03/12/2017 02/09/17   Epifanio Lesches, MD  omeprazole (PRILOSEC) 40 MG capsule Take 40 mg by mouth daily.    [provider]    Allergies Patient has no known allergies.  Family History  Problem Relation Age of Onset  . Cancer Mother        'male cancer"  . Hypertension Mother   . Hyperlipidemia Mother   . Heart disease Father   . Heart attack Father   . Hypertension Father   . Hyperlipidemia Father     Social History Social History   Tobacco Use  . Smoking status: Former Smoker    Packs/day: 1.00    Years: 30.00    Pack years: 30.00    Types: Cigarettes    Last attempt to quit: 07/26/2008    Years since quitting: 8.6  . Smokeless tobacco: Never Used  Substance Use Topics  . Alcohol use: No  . Drug use: No    Review of Systems Constitutional: No fever/chills Eyes: No visual changes. ENT: No sore throat. Cardiovascular: Positive for chest  pain. Respiratory: Positive for shortness of breath. Gastrointestinal: No abdominal pain.  No nausea, no vomiting.  No diarrhea.  No constipation. Genitourinary: Negative  for dysuria. Musculoskeletal: Negative for back pain. Skin: Negative for rash. Neurological: Negative for headaches, focal weakness or numbness.   ____________________________________________   PHYSICAL EXAM:  VITAL SIGNS: ED Triage Vitals  Enc Vitals Group     BP      Pulse      Resp      Temp      Temp src      SpO2      Weight      Height      Head Circumference      Peak Flow      Pain Score      Pain Loc      Pain Edu?      Excl. in [name]?     Constitutional: Alert and oriented x4 appears clearly short of breath speaking in short sentences Eyes: PERRL EOMI. Head: Atraumatic. Nose: No congestion/rhinnorhea. Mouth/Throat: No trismus Neck: No stridor.  Able to lie completely flat although with some difficulty.  No obvious JVD Cardiovascular: Tachycardic rate, regular rhythm. Grossly normal heart sounds.  Good peripheral circulation. Respiratory: Moderate respiratory distress with expiratory wheeze predominantly in the lower fields Gastrointestinal: Soft nontender Musculoskeletal: No lower extremity edema legs are equal in size Neurologic:  Normal speech and language. No gross focal neurologic deficits are appreciated. Skin:  Skin is warm, dry and intact. No rash noted. Psychiatric: Mood and affect are normal. Speech and behavior are normal.    ____________________________________________   DIFFERENTIAL includes but not limited to  Pulmonary edema, CHF, acute coronary syndrome, COPD, pulmonary embolism ____________________________________________   LABS (all labs ordered are listed, but only abnormal results are displayed)  Labs Reviewed  COMPREHENSIVE METABOLIC PANEL - Abnormal; Notable for the following components:      Result Value   Glucose, Bld 164 (*)    BUN 30 (*)    Alkaline  Phosphatase 142 (*)    All other components within normal limits  BRAIN NATRIURETIC PEPTIDE - Abnormal; Notable for the following components:   B Natriuretic Peptide 536.0 (*)    All other components within normal limits  TROPONIN I - Abnormal; Notable for the following components:   Troponin I 0.04 (*)    All other components within normal limits  CBC WITH DIFFERENTIAL/PLATELET - Abnormal; Notable for the following components:   RBC 3.59 (*)    Hemoglobin 11.4 (*)    HCT 34.0 (*)    RDW 14.7 (*)    Neutro Abs 8.5 (*)    Lymphs Abs 0.5 (*)    Eosinophils Absolute 1.0 (*)    All other components within normal limits  PROTIME-INR  CBC  CREATININE, SERUM  TSH  TROPONIN I  TROPONIN I    Lab work reviewed by me shows elevated troponin which is nonspecific and could be secondary to demand versus primary ischemia Elevated beta natruretic peptide consistent with edema __________________________________________  EKG  ED ECG REPORT I, Darel Hong, the attending physician, personally viewed and interpreted this ECG.  Date: 03/12/2017 EKG Time:  Rate: 78 Rhythm: normal sinus rhythm QRS Axis: normal Intervals: normal ST/T Wave abnormalities: Inferior ST depression Narrative Interpretation: Poor R wave progression.  Some inferior ST depression concerning for subendocardial ischemia  ____________________________________________  RADIOLOGY  Chest x-ray reviewed by me suggestive of acute pulmonary edema ____________________________________________   PROCEDURES  Procedure(s) performed: no  .Critical Care Performed by: Darel Hong, MD Authorized by: Darel Hong, MD   Critical care provider statement:    Critical care time (minutes):  30   Critical care time was exclusive of:  Separately billable procedures and treating other patients   Critical care was necessary to treat or prevent imminent or life-threatening deterioration of the following conditions:   Respiratory failure   Critical care was time spent personally by me on the following activities:  Development of treatment plan with patient or surrogate, discussions with consultants, evaluation of patient's response to treatment, examination of patient, obtaining history from patient or surrogate, ordering and performing treatments and interventions, ordering and review of laboratory studies, ordering and review of radiographic studies, pulse oximetry, re-evaluation of patient's condition and review of old charts    Critical Care performed: yes  Observation: no ____________________________________________   INITIAL IMPRESSION / ASSESSMENT AND PLAN / ED COURSE  Pertinent labs & imaging results that were available during my care of the patient were reviewed by me and considered in my medical decision making (see chart for details).  On arrival the patient is clearly short of breath with wheezy lungs.  Differential includes new onset CHF versus COPD.  I will give a trial of Solu-Medrol now all labs are pending.  After duo nebs and Solu-Medrol the patient's symptoms have not improved.  Troponin slightly elevated as is the BNP concerning for new onset CHF and acute pulmonary edema.  I will give a first dose of furosemide now and the patient will require inpatient admission.  I discussed the patient who verbalized understanding and agreement the plan.  I then discussed with the hospitalist who is graciously agreed to admit the patient to his service.      ____________________________________________   FINAL CLINICAL IMPRESSION(S) / ED DIAGNOSES  Final diagnoses:  SOB (shortness of breath)  Chest pain, unspecified type  Acute pulmonary edema (HCC)      NEW MEDICATIONS STARTED DURING THIS VISIT:  New Prescriptions   No medications on file     Note:  This document was prepared using Dragon voice recognition software and may include unintentional dictation errors.       Darel Hong, MD 03/12/17 1158

## 2017-03-13 ENCOUNTER — Inpatient Hospital Stay
Admit: 2017-03-13 | Discharge: 2017-03-13 | Disposition: A | Discharge status: Home / Self Care | Payer: Medicare Other | Attending: Internal Medicine | Admitting: Internal Medicine

## 2017-03-13 DIAGNOSIS — I5031 Acute diastolic (congestive) heart failure: Secondary | ICD-10-CM

## 2017-03-13 LAB — ECHOCARDIOGRAM COMPLETE
HEIGHTINCHES: 69.5 in
Weight: 3464 oz

## 2017-03-13 MED ORDER — MOMETASONE FURO-FORMOTEROL FUM 200-5 MCG/ACT IN AERO
2.0000 | INHALATION_SPRAY | Freq: Two times a day (BID) | RESPIRATORY_TRACT | Status: DC
Start: 2017-03-13 — End: 2017-03-14
  Administered 2017-03-14: 2 via RESPIRATORY_TRACT
  Filled 2017-03-13: qty 8.8

## 2017-03-13 MED ORDER — METHYLPREDNISOLONE SODIUM SUCC 40 MG IJ SOLR
40.0000 mg | Freq: Two times a day (BID) | INTRAMUSCULAR | Status: DC
  Administered 2017-03-13 – 2017-03-14 (×2): 40 mg via INTRAVENOUS
  Filled 2017-03-13 (×2): qty 1

## 2017-03-13 MED ORDER — PERFLUTREN LIPID MICROSPHERE
1.0000 mL | INTRAVENOUS | Status: AC | PRN
  Filled 2017-03-13: qty 10

## 2017-03-13 NOTE — Progress Notes (Signed)
SATURATION QUALIFICATIONS: (This note is used to comply with regulatory documentation for home oxygen)  Patient Saturations on Room Air at Rest = 92%  Patient Saturations on Room Air while Ambulating =94%  Patient Saturations on 0 Liters of oxygen while Ambulating = n/a%  Please briefly explain why patient needs home oxygen:

## 2017-03-13 NOTE — Consult Note (Signed)
[facility] Cardiology  CARDIOLOGY CONSULT NOTE  Patient ID: Donald Juarez MRN: [id number] DOB/AGE: 73-Mar-1947 73 y.o.  Admit date: 03/12/2017 Referring Physician Tressia Miners Primary Physician [place] Primary Cardiologist Nehemiah Massed Reason for Consultation Acute diastolic CHF  HPI: 72 year old male referred for acute diastolic CHF.  The patient has a known history of coronary artery disease, status post MI in 1998 and 2011,with multiple stents, status post CABG x3 (LIMA to LAD, SVG to PDA, SVG to OM1) at [facility] on 02/13/2017, paroxysmal atrial fibrillation on Eliquis, status post catheter ablation in 2015, COPD, hyperlipidemia, hypertension, and sick sinus syndrome status post dual-chamber pacemaker implantation 12/2009.  The patient reports that he was discharged from outpatient cardiac rehab on 03/09/17, and was feeling quite well.  On Monday morning, 03/12/17, the patient reports that he woke up and started to use his nebulizer treatment as he does every morning, but felt so short of breath he could hardly speak.  He denied chest pain, but did developed left-sided thoracic discomfort along the mid axillary line with radiation to his left arm.  He called EMS and was brought to [facility] ER yesterday morning.  The patient's oxygen saturations were in the 80s on arrival.  He received 2 nebulizer treatments with minimal improvement in his symptoms.  Admission labs notable for BNP 536, troponin 0.04, followed by 0.29, and 0.36, and hemoglobin 11.4, hematocrit 34. Currently, the patient reports feeling much better. He reports much easier breathing, denies chest pain, does have some residual left arm numbness that is difficult to describe.  Review of systems complete and found to be negative unless listed above     Past Medical History:  Diagnosis Date  . Anginal pain ([place])    feels this as jaw pain.  takes imdur for this. rarely uses nitro.  [name] Anxiety   . Arthritis   . Asthma   . Bilateral claudication of lower  limb ([place])   . Carotid stenosis    a. 07/2013 Carotid U/S: 100% RICA (pt prev reported h/o 54%), LICA 62-70%, bilat ECA 50%, normal vertebrals and subclavians bilat-->F/U needed in 01/2014.  . Cataract of left eye   . Cataract, right    a. 10/2010 s/p cataract surgery   . COPD (chronic obstructive pulmonary disease) ([place])   . Coronary artery disease    a. 06/1996 & 03/2005 Cath: non-obs dzs;  b. 07/2009 NSTEMI/Cath: LCX 99->PCI/DES extending into OM1;  c. 01/2013 Cath: patent LCX stent-->Med Rx.; d. cath 03/2014: RPAV dzs->Med rx; e. 10/2014 Cath/PCI: LM 30, LAD 20p/m, LCX 50ost/p, 10 ISR, 60d, OM1 min irregs, OM2 50, RCA 30ost, 28m, 60d, RPDA 70(2.5x8 Xience DES), RPAV 90small(PTCA); f. 10/2014 Relook Cath: RPAV 99(Med Rx), EF 45-50.  [name] Dysrhythmia    since ablation, flutter resolved.  . Essential hypertension   . Hyperkalemia   . Hyperlipidemia   . Myocardial infarction ([place]) 2015   had stent inserted and then had heart attack after. stent inserted due to blockage  . Paroxysmal atrial flutter ([place])    a. CHA2DS2VASc = 3 -> eliquis;  b. 07/2013 Echo: EF 50-55%, mildly dil LA.c. s/p RFCA 10/31/2013 by Dr Caryl Comes  . Prostate cancer ([place])   . Pulmonary nodule, right    a. 0.7cm RLL - stable by CT 06/2013.  [name] Rectal cancer ([place])    a. Status post colostomy  . Seizures ([place])    a. last 30 years ago  . Shingles    a. 09/2012.  . SSS (sick sinus syndrome) ([place])  a. 12/2009 S/P MDT [place] ADDR01 DC PPM, ser # [id number] ([name]).  . Syncope and collapse     Past Surgical History:  Procedure Laterality Date  . ABLATION  10/31/2013   CTI ablation by Dr Caryl Comes for atrial flutter  . ATRIAL FLUTTER ABLATION N/A 10/31/2013   Procedure: ATRIAL FLUTTER ABLATION;  Surgeon: [name], MD;  Location: [facility] CATH LAB;  Service: Cardiovascular;  Laterality: N/A;  . CARDIAC CATHETERIZATION  07/2009   [facility]  . CARDIAC CATHETERIZATION  01/2013   [facility]  . CARDIAC CATHETERIZATION  03/2014   [facility]  . CARDIAC  CATHETERIZATION N/A 11/02/2014   Procedure: Left Heart Cath and Cors/Grafts Angiography;  Surgeon: [name], MD;  Location: [place] CV LAB;  Service: Cardiovascular;  Laterality: N/A;  . CARDIAC CATHETERIZATION N/A 11/02/2014   Procedure: Coronary Stent Intervention;  Surgeon: [name], MD;  Location: [place] CV LAB;  Service: Cardiovascular;  Laterality: N/A;  . CARDIAC CATHETERIZATION N/A 11/03/2014   Procedure: Left Heart Cath and Coronary Angiography;  Surgeon: [name], MD;  Location: [place] CV LAB;  Service: Cardiovascular;  Laterality: N/A;  . CARDIAC SURGERY  2011   Stents placed   . CATARACT EXTRACTION Left   . CATARACT EXTRACTION W/PHACO Left 06/01/2014   Procedure: CATARACT EXTRACTION PHACO AND INTRAOCULAR LENS PLACEMENT (IOC);  Surgeon: [name], MD;  Location: [name] ORS;  Service: Ophthalmology;  Laterality: Left;  . cataract surgery  2012  . colon surgery     cancer removal  . COLON SURGERY     colostomy  . COLONOSCOPY WITH PROPOFOL N/A 05/24/2016   Procedure: COLONOSCOPY WITH PROPOFOL;  Surgeon: [name], MD;  Location: [facility] ENDOSCOPY;  Service: Endoscopy;  Laterality: N/A;  . CORONARY ANGIOPLASTY    . INSERT / REPLACE / REMOVE PACEMAKER  2011   just a pacer  . KNEE ARTHROSCOPY Right 1980's  . LEFT HEART CATH AND CORONARY ANGIOGRAPHY N/A 02/08/2017   Procedure: LEFT HEART CATH AND CORONARY ANGIOGRAPHY;  Surgeon: [name], MD;  Location: [place] CV LAB;  Service: Cardiovascular;  Laterality: N/A;  . PACEMAKER PLACEMENT  01/17/2010  . TOTAL KNEE ARTHROPLASTY Right 01/14/2016   Procedure: TOTAL KNEE ARTHROPLASTY;  Surgeon: [name], MD;  Location: [name] ORS;  Service: Orthopedics;  Laterality: Right;    Medications Prior to Admission  Medication Sig Dispense Refill Last Dose  . albuterol (PROAIR HFA) 108 (90 Base) MCG/ACT inhaler Inhale 2 puffs into the lungs every 4 (four) hours as needed for wheezing  or shortness of breath. 3 Inhaler 3 PRN at PRN  . albuterol (PROVENTIL) (2.5 MG/3ML) 0.083% nebulizer solution Take 3 mLs (2.5 mg total) by nebulization every 6 (six) hours as needed for wheezing or shortness of breath. DX:J44.9 360 mL 5 03/11/2017 at 0800  . amiodarone (PACERONE) 200 MG tablet Take 200 mg by mouth daily.   03/11/2017 at 0800  . apixaban (ELIQUIS) 5 MG TABS tablet Take 5 mg by mouth 2 (two) times daily.   03/11/2017 at 2000  . arformoterol (BROVANA) 15 MCG/2ML NEBU Take 2 mLs (15 mcg total) by nebulization 2 (two) times daily. Take 2 mLs (15 mcg total) by nebulization 2 (two) times daily. DX code 493.20 Every morning and 1800 360 mL 3 03/11/2017 at 2000  . aspirin EC 81 MG tablet Take 81 mg by mouth daily.   03/11/2017 at 0800  . atorvastatin (LIPITOR) 80 MG tablet Take 1 tablet (80 mg total) by  mouth daily. 90 tablet 3 03/11/2017 at 2000  . budesonide (PULMICORT) 0.5 MG/2ML nebulizer solution Take 2 mLs (0.5 mg total) by nebulization 2 (two) times daily. DX code 493.20 Every morning and 1800 360 mL 3 03/11/2017 at 2000  . budesonide-formoterol (SYMBICORT) 160-4.5 MCG/ACT inhaler Inhale 2 puffs into the lungs 2 (two) times daily.   Past Week at Unknown time  . Cholecalciferol (VITAMIN D) 2000 UNITS tablet Take 2,000 Units by mouth daily.   03/11/2017 at 0800  . meclizine (ANTIVERT) 12.5 MG tablet Take 1-2 tablets by mouth 3 (three) times daily as needed.   PRN at PRN  . metoprolol tartrate (LOPRESSOR) 25 MG tablet TAKE ONE TABLET BY MOUTH IN THE MORNING AND TAKE TWO TABLETS BY MOUTHIN THE EVENING. (Patient taking differently: Take 12.5 mg by mouth 2 (two) times daily. TAKE ONE TABLET BY MOUTH IN THE MORNING AND TAKE TWO TABLETS BY MOUTHIN THE EVENING.) 180 tablet 4 03/11/2017 at 2000  . nitroGLYCERIN (NITROSTAT) 0.4 MG SL tablet Place 1 tablet (0.4 mg total) under the tongue every 5 (five) minutes as needed for chest pain. 25 tablet 3 PRN at PRN  . PARoxetine (PAXIL) 30 MG tablet Take 30 mg by  mouth daily.    03/11/2017 at 0800  . Tiotropium Bromide Monohydrate (SPIRIVA RESPIMAT) 1.25 MCG/ACT AERS Inhale 2 puffs into the lungs 2 (two) times daily. 12 g 3 03/11/2017 at 2000  . traMADol (ULTRAM) 50 MG tablet Take 1-2 tablets (50-100 mg total) by mouth every 4 (four) hours as needed for moderate pain. (Patient taking differently: Take 50 mg by mouth every 6 (six) hours as needed for moderate pain. ) 40 tablet 0 PRN at PRN  . isosorbide mononitrate (IMDUR) 120 MG 24 hr tablet Take one tablet (120 mg) by mouth at bedtime (Patient not taking: Reported on 03/12/2017) 90 tablet 3 Not Taking at Unknown time  . nitroGLYCERIN (NITROGLYN) 2 % ointment Apply 1 inch topically every 6 (six) hours. (Patient not taking: Reported on 03/12/2017) 30 g 0 Not Taking at Unknown time  . omeprazole (PRILOSEC) 40 MG capsule Take 40 mg by mouth daily.   Not Taking at Unknown time   Social History   Socioeconomic History  . Marital status: Married    Spouse name: Not on file  . Number of children: Not on file  . Years of education: Not on file  . Highest education level: Not on file  Social Needs  . Financial resource strain: Not on file  . Food insecurity - worry: Not on file  . Food insecurity - inability: Not on file  . Transportation needs - medical: Not on file  . Transportation needs - non-medical: Not on file  Occupational History  . Not on file  Tobacco Use  . Smoking status: Former Smoker    Packs/day: 1.00    Years: 30.00    Pack years: 30.00    Types: Cigarettes    Last attempt to quit: 07/26/2008    Years since quitting: 8.6  . Smokeless tobacco: Never Used  Substance and Sexual Activity  . Alcohol use: No  . Drug use: No  . Sexual activity: No  Other Topics Concern  . Not on file  Social History Narrative  . Not on file    Family History  Problem Relation Age of Onset  . Cancer Mother        'male cancer"  . Hypertension Mother   . Hyperlipidemia Mother   . Heart disease  Father   . Heart attack Father   . Hypertension Father   . Hyperlipidemia Father       Review of systems complete and found to be negative unless listed above      PHYSICAL EXAM  General: Well developed, well nourished, in no acute distress HEENT:  Normocephalic and atramatic Neck:  No JVD.  Lungs: Normal effort of breathing on supplemental oxygen. Some expiratory wheezing, no crackles. Heart: HRRR . Normal S1 and S2 without gallops or murmurs.  Chest wall: sternal incision well healing without erythema, warmth, edema or drainage Abdomen: Bowel sounds are positive, abdomen soft and non-tender  Msk:  Back normal, able to sit upright in bed.. Normal strength and tone for age. Extremities: No clubbing, cyanosis or edema.   Neuro: Alert and oriented X 3. Psych:  Good affect, responds appropriately  Labs:   Lab Results  Component Value Date   WBC 10.6 03/12/2017   HGB 11.4 (L) 03/12/2017   HCT 34.0 (L) 03/12/2017   MCV 94.9 03/12/2017   PLT 276 03/12/2017    Recent Labs  Lab 03/12/17 0945  NA 136  K 4.5  CL 104  CO2 22  BUN 30*  CREATININE 1.14  CALCIUM 8.9  PROT 7.3  BILITOT 0.5  ALKPHOS 142*  ALT 20  AST 27  GLUCOSE 164*   Lab Results  Component Value Date   TROPONINI 0.36 ([place]) 03/12/2017   No results found for: CHOL No results found for: HDL No results found for: LDLCALC No results found for: TRIG No results found for: CHOLHDL No results found for: LDLDIRECT    Radiology: Dg Chest Port 1 View  Result Date: 03/12/2017 CLINICAL DATA:  Onset of shortness of breath today. History of COPD and recent cardiac surgery. History of prostate and rectal cancer. EXAM: PORTABLE CHEST 1 VIEW COMPARISON:  Radiographs 02/07/2017 and 04/27/2016. FINDINGS: 0939 hr. Two views submitted. Left subclavian pacemaker leads appear unchanged within the right atrium and right ventricle. Interval median sternotomy and CABG. The heart size is stable. There is increased septal  thickening at both lung bases suspicious for mild interstitial edema. There is probable mild left lower lobe atelectasis. No significant pleural effusion or pneumothorax. IMPRESSION: Interval CABG with suspected mild pulmonary edema and left lower lobe atelectasis. No pneumothorax or significant pleural effusion. Electronically Signed   By: Richardean Sale M.D.   On: 03/12/2017 10:03    EKG: Atrial fibrillation, rate 140 bpm  ASSESSMENT AND PLAN:  1. Acute diastolic CHF, improved with IV Lasix 2. Coronary artery disease, status post MI and multiple stents, and recent CABG x 3 on 02/13/2017, recently discharged from inpatient cardiac rehab on 03/09/17, currently without chest pain. Troponin borderline elevated to 0.04, 0.29, and 0.36, likely demand supply ischemia. 3. Paroxysmal atrial fibrillation, status post catheter ablation in 2015, currently on amiodarone 200 mg and Eliquis 4. Hyperlipidemia, on atorvastatin 5. Acute on chronic COPD exacerbation  Recommendations: 1. Agree with overall therapy. 2. Continue diuresis with careful monitoring of renal status 3. Review 2D echocardiogram  Signed: Clabe Seal PA-C 03/13/2017, 8:36 AM

## 2017-03-13 NOTE — Progress Notes (Signed)
*  PRELIMINARY RESULTS* Echocardiogram 2D Echocardiogram has been performed.  [name] 03/13/2017, 10:22 AM

## 2017-03-13 NOTE — Progress Notes (Signed)
[place] [facility] [place] NAME: Donald Juarez    MR#:  [id number]  DATE OF BIRTH:  1945/07/04  SUBJECTIVE:  CHIEF COMPLAINT:   Chief Complaint  Patient presents with  . Shortness of Breath   -Came in with shortness of breath, diagnosed with congestive heart failure. On 2 L oxygen which is acute. Feeling some better  REVIEW OF SYSTEMS:  Review of Systems  Constitutional: Positive for malaise/fatigue. Negative for chills and fever.  Respiratory: Positive for shortness of breath. Negative for cough and wheezing.   Cardiovascular: Positive for chest pain. Negative for palpitations and leg swelling.  Gastrointestinal: Negative for abdominal pain, constipation, diarrhea, nausea and vomiting.  Genitourinary: Negative for dysuria.  Musculoskeletal: Negative for myalgias.  Neurological: Negative for dizziness, speech change, focal weakness, seizures and headaches.  Psychiatric/Behavioral: Negative for depression.    DRUG ALLERGIES:  No Known Allergies  VITALS:  Blood pressure 108/82, pulse 76, temperature 97.6 F (36.4 C), temperature source Oral, resp. rate 18, height 5' 9.5" (1.765 m), weight 98.2 kg (216 lb 8 oz), SpO2 94 %.  PHYSICAL EXAMINATION:  Physical Exam  GENERAL:  72 y.o.-year-old patient lying in the bed with no acute distress.  EYES: Pupils equal, round, reactive to light and accommodation. No scleral icterus. Extraocular muscles intact.  HEENT: Head atraumatic, normocephalic. Oropharynx and nasopharynx clear.  NECK:  Supple, no jugular venous distention. No thyroid enlargement, no tenderness.  LUNGS: Normal breath sounds bilaterally, no wheezing, rales,rhonchi or crepitation. No use of accessory muscles of respiration. Fine bibasilar crackles CARDIOVASCULAR: S1, S2 normal. CABG scar- well healed. No  rubs, or gallops. 3/6 systolic murmur present, Left chest pacemaker in place ABDOMEN: Soft, nontender, nondistended. Bowel sounds  present. No organomegaly or mass.  EXTREMITIES: No pedal edema, cyanosis, or clubbing.  NEUROLOGIC: Cranial nerves II through XII are intact. Muscle strength 5/5 in all extremities. Sensation intact. Gait not checked.  PSYCHIATRIC: The patient is alert and oriented x 3.  SKIN: No obvious rash, lesion, or ulcer.    LABORATORY PANEL:   CBC Recent Labs  Lab 03/12/17 0945  WBC 10.6  HGB 11.4*  HCT 34.0*  PLT 276   ------------------------------------------------------------------------------------------------------------------  Chemistries  Recent Labs  Lab 03/12/17 0945  NA 136  K 4.5  CL 104  CO2 22  GLUCOSE 164*  BUN 30*  CREATININE 1.14  CALCIUM 8.9  AST 27  ALT 20  ALKPHOS 142*  BILITOT 0.5   ------------------------------------------------------------------------------------------------------------------  Cardiac Enzymes Recent Labs  Lab 03/12/17 2224  TROPONINI 0.36*   ------------------------------------------------------------------------------------------------------------------  RADIOLOGY:  Dg Chest Port 1 View  Result Date: 03/12/2017 CLINICAL DATA:  Onset of shortness of breath today. History of COPD and recent cardiac surgery. History of prostate and rectal cancer. EXAM: PORTABLE CHEST 1 VIEW COMPARISON:  Radiographs 02/07/2017 and 04/27/2016. FINDINGS: 0939 hr. Two views submitted. Left subclavian pacemaker leads appear unchanged within the right atrium and right ventricle. Interval median sternotomy and CABG. The heart size is stable. There is increased septal thickening at both lung bases suspicious for mild interstitial edema. There is probable mild left lower lobe atelectasis. No significant pleural effusion or pneumothorax. IMPRESSION: Interval CABG with suspected mild pulmonary edema and left lower lobe atelectasis. No pneumothorax or significant pleural effusion. Electronically Signed   By: Richardean Sale M.D.   On: 03/12/2017 10:03    EKG:    Orders placed or performed during the hospital encounter of 03/12/17  . EKG 12-Lead  .  EKG 12-Lead    ASSESSMENT AND PLAN:   72 year old male with past medical history significant for CAD status post CABG about 4 weeks ago, COPD not on home oxygen, hypertension, peripheral vascular disease, history of prostate cancer presents to hospital secondary to worsening shortness of breath.  1. Acute congestive heart failure exacerbation-unknown ejection fraction, last EF prior to CABG was noted to be normal. -Continue IV Lasix. -Cardiology consulted. Follow-up echocardiogram -Currently requiring 2 L oxygen which is acute. Wean as tolerated.  2. Acute on chronic COPD exacerbation-improving. On 2 L oxygen. -Continue steroids, dose decreased. Ongoing nebs, inhalers added.  3. Chest pain-with elevated troponin. Cardiology consult is pending. Patient on eliquis. -Concern for ACS, however recent bypass graft surgery done -Troponins are elevated. Denies any chest pain at this time   4. Atrial fibrillation/ atrial flutter-also sick sinus syndrome, status post pacemaker recently generator changed in January 2019 at [place] amiodarone. -On eliquis for anticoagulation.  5. depression-continue Paxil  6. Gerd- protonix  7. CAD- recent CABG for three vessel disease Continue cardiac meds  8. DVT Prophylaxis- on eliquis   All the records are reviewed and case discussed with Care Management/Social Workerr. Management plans discussed with the patient, family and they are in agreement.  CODE STATUS: Full Code  TOTAL TIME TAKING CARE OF THIS PATIENT: 38 minutes.   POSSIBLE D/C IN 2 DAYS, DEPENDING ON CLINICAL CONDITION.   Gladstone Lighter M.D on 03/13/2017 at 1:07 PM  Between 7am to 6pm - Pager - [phone]  After 6pm go to [email or web address] - password [email or web address] [facility]  Office  [phone]  CC: Primary care physician; Sofie Hartigan, MD

## 2017-03-13 NOTE — Progress Notes (Signed)
Pt is OOB to chair.  While in chair CCMD notifies RN that Pt is now in a-fib with a rate of 120. Pt has a hx of a-fib/flutter and is currently on eliquis. MD is notified. No new orders at this time. Instructed to monitor. I will continue to assess.

## 2017-03-13 NOTE — Care Management (Signed)
patient admitted from home with exac copd and acute on chronic CHF.  He had recent CABG at [facility] and discharge to [facility] snf.  he discharged home from [facility] 2/8 with a referral [facility] for [facility] health nurse and physical therapy.  Patient admitted before agency could make initial visit. Current need for 02 is acute and discussed wean and or home 02 assessment during progression. Patient I /o caths on a chronic basis.  also has chronic ostomy that he manages.  Notified [facility]

## 2017-03-13 NOTE — Progress Notes (Signed)
RN assist patient with changing his colostomy bag. Small amount of dark brown stool is present. Stoma is pink. A small amount of blood noted around stoma. Pt states this is normal. Appliance is in place. I will continue to assess.

## 2017-03-13 NOTE — Progress Notes (Signed)
*  PRELIMINARY RESULTS* Echocardiogram 2D Echocardiogram has been performed.  [name] 03/13/2017, 10:14 AM

## 2017-03-13 NOTE — Progress Notes (Signed)
Pt ambulated around nurses station with walker. Pts oxygen saturation remained >90 ranging 91-95% the whole walk. I will continue to assess.

## 2017-03-14 ENCOUNTER — Encounter: Payer: Self-pay | Admitting: Cardiology

## 2017-03-14 LAB — BASIC METABOLIC PANEL
Anion gap: 10 (ref 5–15)
BUN: 46 mg/dL — ABNORMAL HIGH (ref 6–20)
CHLORIDE: 101 mmol/L (ref 101–111)
CO2: 25 mmol/L (ref 22–32)
CREATININE: 1.51 mg/dL — AB (ref 0.61–1.24)
Calcium: 8.6 mg/dL — ABNORMAL LOW (ref 8.9–10.3)
GFR calc Af Amer: 51 mL/min — ABNORMAL LOW (ref >60)
GFR calc non Af Amer: 44 mL/min — ABNORMAL LOW (ref >60)
GLUCOSE: 211 mg/dL — AB (ref 65–99)
POTASSIUM: 4.2 mmol/L (ref 3.5–5.1)
Sodium: 136 mmol/L (ref 135–145)

## 2017-03-14 MED ORDER — METOPROLOL TARTRATE 25 MG PO TABS: 25.0000 mg | ORAL_TABLET | Freq: Two times a day (BID) | ORAL | 2 refills | Status: DC

## 2017-03-14 MED ORDER — FUROSEMIDE 20 MG PO TABS: 20.0000 mg | ORAL_TABLET | Freq: Every day | ORAL | 2 refills | Status: DC

## 2017-03-14 MED ORDER — METOPROLOL TARTRATE 25 MG PO TABS
25.0000 mg | ORAL_TABLET | Freq: Two times a day (BID) | ORAL | Status: DC
  Administered 2017-03-14: 25 mg via ORAL
  Filled 2017-03-14: qty 1

## 2017-03-14 MED ORDER — ISOSORBIDE MONONITRATE ER 30 MG PO TB24: 30.0000 mg | ORAL_TABLET | Freq: Every day | ORAL | 11 refills | Status: DC

## 2017-03-14 MED ORDER — GUAIFENESIN ER 600 MG PO TB12: 600.0000 mg | ORAL_TABLET | Freq: Two times a day (BID) | ORAL | 0 refills | Status: DC

## 2017-03-14 MED ORDER — PREDNISONE 10 MG (21) PO TBPK: ORAL_TABLET | ORAL | 0 refills | Status: DC

## 2017-03-14 NOTE — Progress Notes (Signed)
Pt to be discharged to home today. Iv and tele removed. Foley taken out.(pt self caths at home). disch instructions and prescrips given to the pt. Discharge via [facility]friend to transport.

## 2017-03-14 NOTE — Care Management Important Message (Signed)
Important Message  Patient Details  Name: Donald Juarez MRN: [id number] Date of Birth: 29-Mar-1945   Medicare Important Message Given:  N/A - LOS <3 / Initial given by admissions    Katrina Stack, RN 03/14/2017, 12:30 PM

## 2017-03-14 NOTE — Discharge Instructions (Signed)

## 2017-03-14 NOTE — Progress Notes (Signed)
[facility] Cardiology  SUBJECTIVE: The patient reports feeling much better this morning. He denies chest pain, reports that he is breathing better, currently on room air. He denies palpitations or heart racing.   Vitals:   03/13/17 2101 03/14/17 0028 03/14/17 0409 03/14/17 0602  BP: 116/69   (!) 105/56  Pulse: (!) 126  (!) 123 (!) 120  Resp: 18  20 18   Temp: 98 F (36.7 C)   98.2 F (36.8 C)  TempSrc: Oral   Oral  SpO2: 94% 94% 93% 95%  Weight:    96.1 kg (211 lb 14.4 oz)  Height:         Intake/Output Summary (Last 24 hours) at 03/14/2017 6789 Last data filed at 03/14/2017 3810 Gross per 24 hour  Intake 480 ml  Output 2650 ml  Net -2170 ml      PHYSICAL EXAM  General: Well developed, well nourished, in no acute distress HEENT:  Normocephalic and atramatic Neck:  No JVD.  Lungs: Normal effort of breathing on room air, no accessory respiratory muscle use, scant wheezing Heart: Irregularly irregular, tachycardic Abdomen: Bowel sounds are positive Msk:  Back normal, gait not assessed. Extremities: No clubbing, cyanosis or edema.   Neuro: Alert and oriented X 3. Psych:  Good affect, responds appropriately   LABS: Basic Metabolic Panel: Recent Labs    03/12/17 0945 03/14/17 0510  NA 136 136  K 4.5 4.2  CL 104 101  CO2 22 25  GLUCOSE 164* 211*  BUN 30* 46*  CREATININE 1.14 1.51*  CALCIUM 8.9 8.6*   Liver Function Tests: Recent Labs    03/12/17 0945  AST 27  ALT 20  ALKPHOS 142*  BILITOT 0.5  PROT 7.3  ALBUMIN 3.5   No results for input(s): LIPASE, AMYLASE in the last 72 hours. CBC: Recent Labs    03/12/17 0945  WBC 10.6  NEUTROABS 8.5*  HGB 11.4*  HCT 34.0*  MCV 94.9  PLT 276   Cardiac Enzymes: Recent Labs    03/12/17 0945 03/12/17 1654 03/12/17 2224  TROPONINI 0.04* 0.29* 0.36*   BNP: Invalid input(s): POCBNP D-Dimer: No results for input(s): DDIMER in the last 72 hours. Hemoglobin A1C: No results for input(s): HGBA1C in the last 72  hours. Fasting Lipid Panel: No results for input(s): CHOL, HDL, LDLCALC, TRIG, CHOLHDL, LDLDIRECT in the last 72 hours. Thyroid Function Tests: Recent Labs    03/12/17 0945  TSH 10.191*   Anemia Panel: No results for input(s): VITAMINB12, FOLATE, FERRITIN, TIBC, IRON, RETICCTPCT in the last 72 hours.  Dg Chest Port 1 View  Result Date: 03/12/2017 CLINICAL DATA:  Onset of shortness of breath today. History of COPD and recent cardiac surgery. History of prostate and rectal cancer. EXAM: PORTABLE CHEST 1 VIEW COMPARISON:  Radiographs 02/07/2017 and 04/27/2016. FINDINGS: 0939 hr. Two views submitted. Left subclavian pacemaker leads appear unchanged within the right atrium and right ventricle. Interval median sternotomy and CABG. The heart size is stable. There is increased septal thickening at both lung bases suspicious for mild interstitial edema. There is probable mild left lower lobe atelectasis. No significant pleural effusion or pneumothorax. IMPRESSION: Interval CABG with suspected mild pulmonary edema and left lower lobe atelectasis. No pneumothorax or significant pleural effusion. Electronically Signed   By: Richardean Sale M.D.   On: 03/12/2017 10:03     Echo EF 35-40% while in rapid atrial fibrillation  TELEMETRY: Atrial fibrillation, rate 132 bpm  ASSESSMENT AND PLAN:  Active Problems:   Acute CHF (congestive  heart failure) ([place])    1.  Acute congestive heart failure, overall improved after IV Lasix and oxygen, currently on room air.  Recent echocardiogram revealed LVEF 35-40%, however the patient was in rapid atrial fibrillation. 2. Coronary artery disease, status post MI and multiple stents, and recent CABG x 3 on 02/13/2017, recently discharged from inpatient cardiac rehab on 03/09/17, currently without chest pain. 3. Borderline elevated troponin of 0.04, 0.29, and 0.36, likely demand supply ischemia in the setting of acute CHF exacerbation, in the absence of chest pain 4.   Paroxysmal atrial fibrillation, status post catheter ablation in 2015, currently on amiodarone 200 mg, metoprolol tartrate, and Eliquis. Patient appears asymptomatic. 5.  Acute on chronic COPD exacerbation, overall improved. On room air.  6. Sick sinus syndrome, status post pacemaker implantation  Recommendations: 1. Agree with overall therapy. 2. Increase metoprolol to 25 mg BID 3. Recommend repeat echocardiogram as outpatient to assess LV function post CABG, and while patient's heart rate is better controlled. 4. Continue Eliquis for stroke prevention. 5. Continue amiodarone 200 mg for now 6. No further cardiac diagnostics at this time.   Clabe Seal PA-C 03/14/2017 9:06 AM

## 2017-03-14 NOTE — Discharge Summary (Signed)
[facility] [place] NAME: Donald Juarez    MR#:  [id number]  DATE OF BIRTH:  02/18/1945  DATE OF ADMISSION:  03/12/2017   ADMITTING PHYSICIAN: Dustin Flock, MD  DATE OF DISCHARGE:  03/14/17  PRIMARY CARE PHYSICIAN: Sofie Hartigan, MD   ADMISSION DIAGNOSIS:   Acute pulmonary edema ([place]) [J81.0] SOB (shortness of breath) [R06.02] Chest pain, unspecified type [R07.9]  DISCHARGE DIAGNOSIS:   Active Problems:   Acute CHF (congestive heart failure) ([place])   SECONDARY DIAGNOSIS:   Past Medical History:  Diagnosis Date  . Anginal pain ([place])    feels this as jaw pain.  takes imdur for this. rarely uses nitro.  [name] Anxiety   . Arthritis   . Asthma   . Bilateral claudication of lower limb ([place])   . Carotid stenosis    a. 07/2013 Carotid U/S: 100% RICA (pt prev reported h/o 70%), LICA 26-37%, bilat ECA 50%, normal vertebrals and subclavians bilat-->F/U needed in 01/2014.  . Cataract of left eye   . Cataract, right    a. 10/2010 s/p cataract surgery   . COPD (chronic obstructive pulmonary disease) ([place])   . Coronary artery disease    a. 06/1996 & 03/2005 Cath: non-obs dzs;  b. 07/2009 NSTEMI/Cath: LCX 99->PCI/DES extending into OM1;  c. 01/2013 Cath: patent LCX stent-->Med Rx.; d. cath 03/2014: RPAV dzs->Med rx; e. 10/2014 Cath/PCI: LM 30, LAD 20p/m, LCX 50ost/p, 10 ISR, 60d, OM1 min irregs, OM2 50, RCA 30ost, 40m, 60d, RPDA 70(2.5x8 Xience DES), RPAV 90small(PTCA); f. 10/2014 Relook Cath: RPAV 99(Med Rx), EF 45-50.  [name] Dysrhythmia    since ablation, flutter resolved.  . Essential hypertension   . Hyperkalemia   . Hyperlipidemia   . Myocardial infarction ([place]) 2015   had stent inserted and then had heart attack after. stent inserted due to blockage  . Paroxysmal atrial flutter ([place])    a. CHA2DS2VASc = 3 -> eliquis;  b. 07/2013 Echo: EF 50-55%, mildly dil LA.c. s/p RFCA 10/31/2013 by Dr Caryl Comes  . Prostate cancer ([place])   . Pulmonary nodule,  right    a. 0.7cm RLL - stable by CT 06/2013.  [name] Rectal cancer ([place])    a. Status post colostomy  . Seizures ([place])    a. last 30 years ago  . Shingles    a. 09/2012.  . SSS (sick sinus syndrome) ([place])    a. 12/2009 S/P MDT Adapta ADDR01 DC PPM, ser # [id number] ([name]).  . Syncope and collapse     HOSPITAL COURSE:   72 year old male with past medical history significant for CAD status post CABG about 4 weeks ago, COPD not on home oxygen, hypertension, peripheral vascular disease, history of prostate cancer presents to hospital secondary to worsening shortness of breath.  1. Acute congestive heart failure exacerbation- acute systolic CHF - EF of 41%, recent CABG in January 2019. - received IV lasix in the hospital -Cardiology consult appreciated.  Renal function slowly worsening with Lasix.  Being discharged on 20 mg of Lasix.  Need to be evaluated as outpatient. -Outpatient cardiology follow-up.  Off oxygen this time and has been ambulating well.  2. Acute on chronic COPD exacerbation-improving.  Off oxygen this morning. -Discharged on prednisone taper.  Received IV steroids in the hospital.  Continue nebulizers and inhalers.    3. Chest pain-with elevated troponin.  Not ACS according to cardiology.  Outpatient follow-up recommended.  Patient denies any chest pain. -recent bypass graft  surgery done  4. Atrial fibrillation/ atrial flutter-also sick sinus syndrome, status post pacemaker recently generator changed in January 2019 at [facility] amiodarone.  Metoprolol dose increased to 25 mg twice daily at discharge. -On eliquis for anticoagulation.  5. depression-continue Paxil  6. Gerd-Prilosec  7. CAD- recent CABG for three vessel disease Continue cardiac meds Outpatient cardiology follow-up and echo as outpatient   Will be discharged with home health today.    DISCHARGE CONDITIONS:   Guarded  CONSULTS OBTAINED:   Treatment Team:  Isaias Cowman,  MD  DRUG ALLERGIES:   No Known Allergies DISCHARGE MEDICATIONS:   Allergies as of 03/14/2017   No Known Allergies     Medication List    TAKE these medications   albuterol 108 (90 Base) MCG/ACT inhaler Commonly known as:  PROAIR HFA Inhale 2 puffs into the lungs every 4 (four) hours as needed for wheezing or shortness of breath.   albuterol (2.5 MG/3ML) 0.083% nebulizer solution Commonly known as:  PROVENTIL Take 3 mLs (2.5 mg total) by nebulization every 6 (six) hours as needed for wheezing or shortness of breath. DX:J44.9   amiodarone 200 MG tablet Commonly known as:  PACERONE Take 200 mg by mouth daily.   arformoterol 15 MCG/2ML Nebu Commonly known as:  BROVANA Take 2 mLs (15 mcg total) by nebulization 2 (two) times daily. Take 2 mLs (15 mcg total) by nebulization 2 (two) times daily. DX code 493.20 Every morning and 1800   aspirin EC 81 MG tablet Take 81 mg by mouth daily.   atorvastatin 80 MG tablet Commonly known as:  LIPITOR Take 1 tablet (80 mg total) by mouth daily.   budesonide 0.5 MG/2ML nebulizer solution Commonly known as:  PULMICORT Take 2 mLs (0.5 mg total) by nebulization 2 (two) times daily. DX code 493.20 Every morning and 1800   budesonide-formoterol 160-4.5 MCG/ACT inhaler Commonly known as:  SYMBICORT Inhale 2 puffs into the lungs 2 (two) times daily.   ELIQUIS 5 MG Tabs tablet Generic drug:  apixaban Take 5 mg by mouth 2 (two) times daily.   furosemide 20 MG tablet Commonly known as:  LASIX Take 1 tablet (20 mg total) by mouth daily.   guaiFENesin 600 MG 12 hr tablet Commonly known as:  MUCINEX Take 1 tablet (600 mg total) by mouth 2 (two) times daily.   isosorbide mononitrate 30 MG 24 hr tablet Commonly known as:  IMDUR Take 1 tablet (30 mg total) by mouth daily. What changed:    medication strength  how much to take  how to take this  when to take this  additional instructions   meclizine 12.5 MG tablet Commonly known  as:  ANTIVERT Take 1-2 tablets by mouth 3 (three) times daily as needed.   metoprolol tartrate 25 MG tablet Commonly known as:  LOPRESSOR Take 1 tablet (25 mg total) by mouth 2 (two) times daily. What changed:    how much to take  how to take this  when to take this  additional instructions   nitroGLYCERIN 0.4 MG SL tablet Commonly known as:  NITROSTAT Place 1 tablet (0.4 mg total) under the tongue every 5 (five) minutes as needed for chest pain. What changed:  Another medication with the same name was removed. Continue taking this medication, and follow the directions you see here.   omeprazole 40 MG capsule Commonly known as:  PRILOSEC Take 40 mg by mouth daily.   PARoxetine 30 MG tablet Commonly known as:  PAXIL Take  30 mg by mouth daily.   predniSONE 10 MG (21) Tbpk tablet Commonly known as:  STERAPRED UNI-PAK 21 TAB 6 tabs PO x 1 day 5 tabs PO x 1 day 4 tabs PO x 1 day 3 tabs PO x 1 day 2 tabs PO x 1 day 1 tab PO x 1 day and stop   Tiotropium Bromide Monohydrate 1.25 MCG/ACT Aers Commonly known as:  SPIRIVA RESPIMAT Inhale 2 puffs into the lungs 2 (two) times daily.   traMADol 50 MG tablet Commonly known as:  ULTRAM Take 1-2 tablets (50-100 mg total) by mouth every 4 (four) hours as needed for moderate pain. What changed:    how much to take  when to take this   Vitamin D 2000 units tablet Take 2,000 Units by mouth daily.        DISCHARGE INSTRUCTIONS:   1. PCP f/u in 1 week 2. Cardiology f/u in 1-2 weeks  DIET:   Cardiac diet  ACTIVITY:   Activity as tolerated  OXYGEN:   Home Oxygen: No.  Oxygen Delivery: room air  DISCHARGE LOCATION:   home   If you experience worsening of your admission symptoms, develop shortness of breath, life threatening emergency, suicidal or homicidal thoughts you must seek medical attention immediately by calling 911 or calling your MD immediately  if symptoms less severe.  You Must read complete  instructions/literature along with all the possible adverse reactions/side effects for all the Medicines you take and that have been prescribed to you. Take any new Medicines after you have completely understood and accpet all the possible adverse reactions/side effects.   Please note  You were cared for by a hospitalist during your hospital stay. If you have any questions about your discharge medications or the care you received while you were in the hospital after you are discharged, you can call the unit and asked to speak with the hospitalist on call if the hospitalist that took care of you is not available. Once you are discharged, your primary care physician will handle any further medical issues. Please note that NO REFILLS for any discharge medications will be authorized once you are discharged, as it is imperative that you return to your primary care physician (or establish a relationship with a primary care physician if you do not have one) for your aftercare needs so that they can reassess your need for medications and monitor your lab values.    On the day of Discharge:  VITAL SIGNS:   Blood pressure 110/70, pulse (!) 112, temperature 97.6 F (36.4 C), temperature source Oral, resp. rate 19, height 5' 9.5" (1.765 m), weight 96.1 kg (211 lb 14.4 oz), SpO2 94 %.  PHYSICAL EXAMINATION:    GENERAL:  72 y.o.-year-old patient lying in the bed with no acute distress.  EYES: Pupils equal, round, reactive to light and accommodation. No scleral icterus. Extraocular muscles intact.  HEENT: Head atraumatic, normocephalic. Oropharynx and nasopharynx clear.  NECK:  Supple, no jugular venous distention. No thyroid enlargement, no tenderness.  LUNGS: Normal breath sounds bilaterally, no wheezing, rales,rhonchi or crepitation. No use of accessory muscles of respiration.  CARDIOVASCULAR: S1, S2 normal. CABG scar- well healed. No  rubs, or gallops. 3/6 systolic murmur present, Left chest pacemaker in  place ABDOMEN: Soft, nontender, nondistended. Bowel sounds present. No organomegaly or mass.  EXTREMITIES: No pedal edema, cyanosis, or clubbing.  NEUROLOGIC: Cranial nerves II through XII are intact. Muscle strength 5/5 in all extremities. Sensation intact. Gait not checked.  PSYCHIATRIC: The patient is alert and oriented x 3.  SKIN: No obvious rash, lesion, or ulcer.     DATA REVIEW:   CBC Recent Labs  Lab 03/12/17 0945  WBC 10.6  HGB 11.4*  HCT 34.0*  PLT 276    Chemistries  Recent Labs  Lab 03/12/17 0945 03/14/17 0510  NA 136 136  K 4.5 4.2  CL 104 101  CO2 22 25  GLUCOSE 164* 211*  BUN 30* 46*  CREATININE 1.14 1.51*  CALCIUM 8.9 8.6*  AST 27  --   ALT 20  --   ALKPHOS 142*  --   BILITOT 0.5  --      Microbiology Results  Results for orders placed or performed during the hospital encounter of 02/07/17  MRSA PCR Screening     Status: None   Collection Time: 02/07/17  7:26 PM  Result Value Ref Range Status   MRSA by PCR NEGATIVE NEGATIVE Final    Comment:        The GeneXpert MRSA Assay (FDA approved for NASAL specimens only), is one component of a comprehensive MRSA colonization surveillance program. It is not intended to diagnose MRSA infection nor to guide or monitor treatment for MRSA infections. Performed at [facility], [address], [place], [place] [address]     RADIOLOGY:  No results found.   Management plans discussed with the patient, family and they are in agreement.  CODE STATUS:     Code Status Orders  (From admission, onward)        Start     Ordered   03/12/17 1134  Full code  Continuous     03/12/17 1135    Code Status History    Date Active Date Inactive Code Status Order ID Comments User Context   02/07/2017 19:27 02/10/2017 08:25 Full Code [id number]  Nicholes Mango, MD Inpatient   01/14/2016 11:22 01/17/2016 16:59 Full Code [id number]  [name], MD Inpatient   11/03/2014 16:40 11/04/2014 13:57 Full Code  [id number]  [name], MD Inpatient   11/02/2014 13:33 11/03/2014 16:40 Full Code [id number]  [name], MD Inpatient   10/31/2013 12:08 11/01/2013 01:02 Full Code [id number]  [name], MD Inpatient      TOTAL TIME TAKING CARE OF THIS PATIENT: 38 minutes.    Gladstone Lighter M.D on 03/14/2017 at 9:35 AM  Between 7am to 6pm - Pager - [phone]  After 6pm go to [email or web address] - Proofreader  [facility]  Office  [phone]  CC: Primary care physician; Sofie Hartigan, MD   Note: This dictation was prepared with Dragon dictation along with smaller phrase technology. Any transcriptional errors that result from this process are unintentional.

## 2017-03-19 ENCOUNTER — Emergency Department: Payer: Medicare Other

## 2017-03-19 ENCOUNTER — Other Ambulatory Visit: Payer: Self-pay

## 2017-03-19 ENCOUNTER — Inpatient Hospital Stay
Admission: EM | Admit: 2017-03-19 | Discharge: 2017-03-23 | DRG: 190 | Disposition: A | Discharge status: Home Health | Payer: Medicare Other | Attending: Internal Medicine | Admitting: Internal Medicine

## 2017-03-19 ENCOUNTER — Encounter: Payer: Self-pay | Admitting: Emergency Medicine

## 2017-03-19 DIAGNOSIS — Z951 Presence of aortocoronary bypass graft: Secondary | ICD-10-CM

## 2017-03-19 DIAGNOSIS — I5023 Acute on chronic systolic (congestive) heart failure: Secondary | ICD-10-CM | POA: Diagnosis present

## 2017-03-19 DIAGNOSIS — F419 Anxiety disorder, unspecified: Secondary | ICD-10-CM | POA: Diagnosis present

## 2017-03-19 DIAGNOSIS — Z87891 Personal history of nicotine dependence: Secondary | ICD-10-CM

## 2017-03-19 DIAGNOSIS — Z9049 Acquired absence of other specified parts of digestive tract: Secondary | ICD-10-CM

## 2017-03-19 DIAGNOSIS — Z79899 Other long term (current) drug therapy: Secondary | ICD-10-CM

## 2017-03-19 DIAGNOSIS — R0602 Shortness of breath: Secondary | ICD-10-CM | POA: Diagnosis not present

## 2017-03-19 DIAGNOSIS — J441 Chronic obstructive pulmonary disease with (acute) exacerbation: Secondary | ICD-10-CM | POA: Diagnosis not present

## 2017-03-19 DIAGNOSIS — Z809 Family history of malignant neoplasm, unspecified: Secondary | ICD-10-CM

## 2017-03-19 DIAGNOSIS — Z8546 Personal history of malignant neoplasm of prostate: Secondary | ICD-10-CM

## 2017-03-19 DIAGNOSIS — J9620 Acute and chronic respiratory failure, unspecified whether with hypoxia or hypercapnia: Secondary | ICD-10-CM | POA: Diagnosis present

## 2017-03-19 DIAGNOSIS — I2510 Atherosclerotic heart disease of native coronary artery without angina pectoris: Secondary | ICD-10-CM | POA: Diagnosis present

## 2017-03-19 DIAGNOSIS — Z8349 Family history of other endocrine, nutritional and metabolic diseases: Secondary | ICD-10-CM

## 2017-03-19 DIAGNOSIS — Z8249 Family history of ischemic heart disease and other diseases of the circulatory system: Secondary | ICD-10-CM

## 2017-03-19 DIAGNOSIS — I252 Old myocardial infarction: Secondary | ICD-10-CM

## 2017-03-19 DIAGNOSIS — Z7901 Long term (current) use of anticoagulants: Secondary | ICD-10-CM

## 2017-03-19 DIAGNOSIS — Z7982 Long term (current) use of aspirin: Secondary | ICD-10-CM

## 2017-03-19 DIAGNOSIS — J9601 Acute respiratory failure with hypoxia: Secondary | ICD-10-CM | POA: Diagnosis present

## 2017-03-19 DIAGNOSIS — I959 Hypotension, unspecified: Secondary | ICD-10-CM | POA: Diagnosis not present

## 2017-03-19 DIAGNOSIS — Z7951 Long term (current) use of inhaled steroids: Secondary | ICD-10-CM

## 2017-03-19 DIAGNOSIS — E785 Hyperlipidemia, unspecified: Secondary | ICD-10-CM | POA: Diagnosis present

## 2017-03-19 DIAGNOSIS — I509 Heart failure, unspecified: Secondary | ICD-10-CM

## 2017-03-19 DIAGNOSIS — Z950 Presence of cardiac pacemaker: Secondary | ICD-10-CM

## 2017-03-19 DIAGNOSIS — B965 Pseudomonas (aeruginosa) (mallei) (pseudomallei) as the cause of diseases classified elsewhere: Secondary | ICD-10-CM | POA: Diagnosis present

## 2017-03-19 DIAGNOSIS — Z85048 Personal history of other malignant neoplasm of rectum, rectosigmoid junction, and anus: Secondary | ICD-10-CM

## 2017-03-19 DIAGNOSIS — I110 Hypertensive heart disease with heart failure: Secondary | ICD-10-CM | POA: Diagnosis present

## 2017-03-19 DIAGNOSIS — Z96651 Presence of right artificial knee joint: Secondary | ICD-10-CM | POA: Diagnosis present

## 2017-03-19 DIAGNOSIS — Z955 Presence of coronary angioplasty implant and graft: Secondary | ICD-10-CM

## 2017-03-19 DIAGNOSIS — Z933 Colostomy status: Secondary | ICD-10-CM

## 2017-03-19 LAB — COMPREHENSIVE METABOLIC PANEL
ALT: 87 U/L — AB (ref 17–63)
AST: 102 U/L — ABNORMAL HIGH (ref 15–41)
Albumin: 3.3 g/dL — ABNORMAL LOW (ref 3.5–5.0)
Alkaline Phosphatase: 128 U/L — ABNORMAL HIGH (ref 38–126)
Anion gap: 9 (ref 5–15)
BUN: 31 mg/dL — AB (ref 6–20)
CHLORIDE: 107 mmol/L (ref 101–111)
CO2: 21 mmol/L — AB (ref 22–32)
CREATININE: 1.03 mg/dL (ref 0.61–1.24)
Calcium: 8.4 mg/dL — ABNORMAL LOW (ref 8.9–10.3)
GFR calc Af Amer: >60 mL/min (ref >60)
Glucose, Bld: 182 mg/dL — ABNORMAL HIGH (ref 65–99)
Potassium: 4.6 mmol/L (ref 3.5–5.1)
SODIUM: 137 mmol/L (ref 135–145)
Total Bilirubin: 0.6 mg/dL (ref 0.3–1.2)
Total Protein: 6.5 g/dL (ref 6.5–8.1)

## 2017-03-19 LAB — BLOOD GAS, VENOUS
Acid-base deficit: 11.6 mmol/L — ABNORMAL HIGH (ref 0.0–2.0)
BICARBONATE: 15.6 mmol/L — AB (ref 20.0–28.0)
DELIVERY SYSTEMS: POSITIVE
FIO2: 0.3
O2 Saturation: 65.9 %
PH VEN: 7.2 — AB (ref 7.250–7.430)
PO2 VEN: 43 mmHg (ref 32.0–45.0)
Patient temperature: 37
pCO2, Ven: 40 mmHg — ABNORMAL LOW (ref 44.0–60.0)

## 2017-03-19 LAB — CBC
HCT: 33.5 % — ABNORMAL LOW (ref 40.0–52.0)
HEMOGLOBIN: 11.2 g/dL — AB (ref 13.0–18.0)
MCH: 31.5 pg (ref 26.0–34.0)
MCHC: 33.3 g/dL (ref 32.0–36.0)
MCV: 94.4 fL (ref 80.0–100.0)
PLATELETS: 275 10*3/uL (ref 150–440)
RBC: 3.55 MIL/uL — ABNORMAL LOW (ref 4.40–5.90)
RDW: 15.4 % — ABNORMAL HIGH (ref 11.5–14.5)
WBC: 7.4 10*3/uL (ref 3.8–10.6)

## 2017-03-19 LAB — PROTIME-INR
INR: 1.12
Prothrombin Time: 14.3 seconds (ref 11.4–15.2)

## 2017-03-19 LAB — LACTIC ACID, PLASMA
Lactic Acid, Venous: 2.1 mmol/L (ref 0.5–1.9)
Lactic Acid, Venous: 1.1 mmol/L (ref 0.5–1.9)

## 2017-03-19 LAB — BRAIN NATRIURETIC PEPTIDE: B NATRIURETIC PEPTIDE 5: 557 pg/mL — AB (ref 0.0–100.0)

## 2017-03-19 LAB — TROPONIN I: Troponin I: 0.03 ng/mL (ref <0.03)

## 2017-03-19 MED ORDER — METHYLPREDNISOLONE SODIUM SUCC 125 MG IJ SOLR
INTRAMUSCULAR | Status: AC
  Administered 2017-03-19: 125 mg via INTRAVENOUS
  Filled 2017-03-19: qty 2

## 2017-03-19 MED ORDER — BISACODYL 5 MG PO TBEC: 5.0000 mg | DELAYED_RELEASE_TABLET | Freq: Every day | ORAL | Status: DC | PRN

## 2017-03-19 MED ORDER — APIXABAN 5 MG PO TABS
5.0000 mg | ORAL_TABLET | Freq: Two times a day (BID) | ORAL | Status: DC
  Administered 2017-03-19 – 2017-03-23 (×9): 5 mg via ORAL
  Filled 2017-03-19 (×9): qty 1

## 2017-03-19 MED ORDER — ACETAMINOPHEN 325 MG PO TABS: 650.0000 mg | ORAL_TABLET | Freq: Four times a day (QID) | ORAL | Status: DC | PRN

## 2017-03-19 MED ORDER — AMIODARONE HCL 200 MG PO TABS
200.0000 mg | ORAL_TABLET | Freq: Every day | ORAL | Status: DC
  Administered 2017-03-19 – 2017-03-23 (×5): 200 mg via ORAL
  Filled 2017-03-19 (×5): qty 1

## 2017-03-19 MED ORDER — METOPROLOL TARTRATE 25 MG PO TABS
25.0000 mg | ORAL_TABLET | Freq: Two times a day (BID) | ORAL | Status: DC
  Administered 2017-03-19 – 2017-03-23 (×8): 25 mg via ORAL
  Filled 2017-03-19 (×8): qty 1

## 2017-03-19 MED ORDER — ISOSORBIDE MONONITRATE ER 30 MG PO TB24
30.0000 mg | ORAL_TABLET | Freq: Every day | ORAL | Status: DC
  Administered 2017-03-19 – 2017-03-23 (×5): 30 mg via ORAL
  Filled 2017-03-19 (×5): qty 1

## 2017-03-19 MED ORDER — METHYLPREDNISOLONE SODIUM SUCC 125 MG IJ SOLR
125.0000 mg | Freq: Once | INTRAMUSCULAR | Status: AC
  Administered 2017-03-19: 125 mg via INTRAVENOUS

## 2017-03-19 MED ORDER — ONDANSETRON HCL 4 MG PO TABS: 4.0000 mg | ORAL_TABLET | Freq: Four times a day (QID) | ORAL | Status: DC | PRN

## 2017-03-19 MED ORDER — ARFORMOTEROL TARTRATE 15 MCG/2ML IN NEBU
15.0000 ug | INHALATION_SOLUTION | Freq: Two times a day (BID) | RESPIRATORY_TRACT | Status: DC
  Administered 2017-03-19: 15 ug via RESPIRATORY_TRACT
  Filled 2017-03-19 (×2): qty 2

## 2017-03-19 MED ORDER — NITROGLYCERIN 0.4 MG SL SUBL: 0.4000 mg | SUBLINGUAL_TABLET | SUBLINGUAL | Status: DC | PRN

## 2017-03-19 MED ORDER — MAGNESIUM SULFATE 2 GM/50ML IV SOLN
2.0000 g | Freq: Once | INTRAVENOUS | Status: AC
  Administered 2017-03-19: 2 g via INTRAVENOUS

## 2017-03-19 MED ORDER — IPRATROPIUM-ALBUTEROL 0.5-2.5 (3) MG/3ML IN SOLN
RESPIRATORY_TRACT | Status: AC
  Administered 2017-03-19: 3 mL via RESPIRATORY_TRACT
  Filled 2017-03-19: qty 3

## 2017-03-19 MED ORDER — MOMETASONE FURO-FORMOTEROL FUM 200-5 MCG/ACT IN AERO
2.0000 | INHALATION_SPRAY | Freq: Two times a day (BID) | RESPIRATORY_TRACT | Status: DC
  Filled 2017-03-19: qty 8.8

## 2017-03-19 MED ORDER — FUROSEMIDE 20 MG PO TABS
20.0000 mg | ORAL_TABLET | Freq: Two times a day (BID) | ORAL | Status: DC
  Administered 2017-03-19 – 2017-03-23 (×9): 20 mg via ORAL
  Filled 2017-03-19 (×9): qty 1

## 2017-03-19 MED ORDER — MAGNESIUM SULFATE 2 GM/50ML IV SOLN
INTRAVENOUS | Status: AC
  Administered 2017-03-19: 2 g via INTRAVENOUS
  Filled 2017-03-19: qty 50

## 2017-03-19 MED ORDER — TRAZODONE HCL 50 MG PO TABS
25.0000 mg | ORAL_TABLET | Freq: Every evening | ORAL | Status: DC | PRN
  Administered 2017-03-20: 25 mg via ORAL
  Filled 2017-03-19 (×2): qty 1

## 2017-03-19 MED ORDER — TRAMADOL HCL 50 MG PO TABS
50.0000 mg | ORAL_TABLET | ORAL | Status: DC | PRN
Start: 2017-03-19 — End: 2017-03-23
  Administered 2017-03-21: 100 mg via ORAL
  Filled 2017-03-19: qty 2

## 2017-03-19 MED ORDER — BUDESONIDE 0.5 MG/2ML IN SUSP
0.5000 mg | Freq: Two times a day (BID) | RESPIRATORY_TRACT | Status: DC
  Administered 2017-03-19 – 2017-03-23 (×7): 0.5 mg via RESPIRATORY_TRACT
  Filled 2017-03-19 (×7): qty 2

## 2017-03-19 MED ORDER — PANTOPRAZOLE SODIUM 40 MG PO TBEC
40.0000 mg | DELAYED_RELEASE_TABLET | Freq: Every day | ORAL | Status: DC
  Administered 2017-03-19 – 2017-03-23 (×5): 40 mg via ORAL
  Filled 2017-03-19 (×6): qty 1

## 2017-03-19 MED ORDER — ONDANSETRON HCL 4 MG/2ML IJ SOLN: 4.0000 mg | Freq: Four times a day (QID) | INTRAMUSCULAR | Status: DC | PRN

## 2017-03-19 MED ORDER — ACETAMINOPHEN 650 MG RE SUPP
650.0000 mg | Freq: Four times a day (QID) | RECTAL | Status: DC | PRN
Start: 2017-03-19 — End: 2017-03-23

## 2017-03-19 MED ORDER — VITAMIN D 1000 UNITS PO TABS
2000.0000 [IU] | ORAL_TABLET | Freq: Every day | ORAL | Status: DC
  Administered 2017-03-19 – 2017-03-23 (×5): 2000 [IU] via ORAL
  Filled 2017-03-19 (×5): qty 2

## 2017-03-19 MED ORDER — METHYLPREDNISOLONE SODIUM SUCC 125 MG IJ SOLR
60.0000 mg | INTRAMUSCULAR | Status: DC
  Administered 2017-03-20 – 2017-03-23 (×4): 60 mg via INTRAVENOUS
  Filled 2017-03-19 (×4): qty 2

## 2017-03-19 MED ORDER — ASPIRIN EC 81 MG PO TBEC
81.0000 mg | DELAYED_RELEASE_TABLET | Freq: Every day | ORAL | Status: DC
  Administered 2017-03-19 – 2017-03-23 (×5): 81 mg via ORAL
  Filled 2017-03-19 (×5): qty 1

## 2017-03-19 MED ORDER — IPRATROPIUM-ALBUTEROL 0.5-2.5 (3) MG/3ML IN SOLN
3.0000 mL | Freq: Four times a day (QID) | RESPIRATORY_TRACT | Status: DC
  Administered 2017-03-19 – 2017-03-23 (×12): 3 mL via RESPIRATORY_TRACT
  Filled 2017-03-19 (×14): qty 3

## 2017-03-19 MED ORDER — DOCUSATE SODIUM 100 MG PO CAPS
100.0000 mg | ORAL_CAPSULE | Freq: Two times a day (BID) | ORAL | Status: DC
  Administered 2017-03-19: 100 mg via ORAL
  Filled 2017-03-19 (×7): qty 1

## 2017-03-19 MED ORDER — PAROXETINE HCL 10 MG PO TABS
30.0000 mg | ORAL_TABLET | Freq: Every day | ORAL | Status: DC
  Administered 2017-03-19 – 2017-03-23 (×5): 30 mg via ORAL
  Filled 2017-03-19 (×5): qty 3

## 2017-03-19 MED ORDER — IPRATROPIUM-ALBUTEROL 0.5-2.5 (3) MG/3ML IN SOLN
3.0000 mL | Freq: Once | RESPIRATORY_TRACT | Status: AC
  Administered 2017-03-19: 3 mL via RESPIRATORY_TRACT

## 2017-03-19 MED ORDER — GUAIFENESIN ER 600 MG PO TB12
600.0000 mg | ORAL_TABLET | Freq: Two times a day (BID) | ORAL | Status: DC
  Administered 2017-03-19 – 2017-03-23 (×9): 600 mg via ORAL
  Filled 2017-03-19 (×9): qty 1

## 2017-03-19 NOTE — ED Notes (Signed)
Discontinued biPap at this time per MD Dahlia Client, pt placed on 2L . Pt in NAD at this time, 99%, will continue to monitor

## 2017-03-19 NOTE — H&P (Signed)
[facility] [place] NAME: Donald Juarez    MR#:  [id number]  DATE OF BIRTH:  Oct 05, 1945  DATE OF ADMISSION:  03/19/2017  PRIMARY CARE PHYSICIAN: Sofie Hartigan, MD   REQUESTING/REFERRING PHYSICIAN: Dr. Dahlia Client  CHIEF COMPLAINT: Shortness of breath   Chief Complaint  Patient presents with  . Shortness of Breath    HISTORY OF PRESENT ILLNESS:  Donald Juarez  is a 72 y.o. male with a known history of essential hypertension CABG in [facility] came in because of shortness of breath, patient woke up with shortness of breath so called ambulance.  Patient received BiPAP for 2 hours in the emergency room because O2 sat 70% by EMS.  Initially he was on nonrebreather in route.  He had labored breathing and wheezing put on BiPAP by ER physician.  Received BiPAP for 2 hours after that he felt better.  Patient is ready to go home but that he did have some wheezing state leading 2 L of oxygen.  So we are going to keep him in observation for overnight for COPD exacerbation.  Patient says that he is missing his wife and really wants to go home tomorrow.  No chest pain.  No pedal edema.  Had bypass at [facility] recently on June 15. PAST MEDICAL HISTORY:   Past Medical History:  Diagnosis Date  . Anginal pain ([place])    feels this as jaw pain.  takes imdur for this. rarely uses nitro.  [name] Anxiety   . Arthritis   . Asthma   . Bilateral claudication of lower limb ([place])   . Carotid stenosis    a. 07/2013 Carotid U/S: 100% RICA (pt prev reported h/o 62%), LICA 95-28%, bilat ECA 50%, normal vertebrals and subclavians bilat-->F/U needed in 01/2014.  . Cataract of left eye   . Cataract, right    a. 10/2010 s/p cataract surgery   . COPD (chronic obstructive pulmonary disease) ([place])   . Coronary artery disease    a. 06/1996 & 03/2005 Cath: non-obs dzs;  b. 07/2009 NSTEMI/Cath: LCX 99->PCI/DES extending into OM1;  c. 01/2013 Cath: patent LCX stent-->Med Rx.; d. cath 03/2014:  RPAV dzs->Med rx; e. 10/2014 Cath/PCI: LM 30, LAD 20p/m, LCX 50ost/p, 10 ISR, 60d, OM1 min irregs, OM2 50, RCA 30ost, 31m, 60d, RPDA 70(2.5x8 Xience DES), RPAV 90small(PTCA); f. 10/2014 Relook Cath: RPAV 99(Med Rx), EF 45-50.  [name] Dysrhythmia    since ablation, flutter resolved.  . Essential hypertension   . Hyperkalemia   . Hyperlipidemia   . Myocardial infarction ([place]) 2015   had stent inserted and then had heart attack after. stent inserted due to blockage  . Paroxysmal atrial flutter ([place])    a. CHA2DS2VASc = 3 -> eliquis;  b. 07/2013 Echo: EF 50-55%, mildly dil LA.c. s/p RFCA 10/31/2013 by Dr Caryl Comes  . Prostate cancer ([place])   . Pulmonary nodule, right    a. 0.7cm RLL - stable by CT 06/2013.  [name] Rectal cancer ([place])    a. Status post colostomy  . Seizures ([place])    a. last 30 years ago  . Shingles    a. 09/2012.  . SSS (sick sinus syndrome) ([place])    a. 12/2009 S/P MDT Adapta ADDR01 DC PPM, ser # [id number] ([name]).  . Syncope and collapse     PAST SURGICAL HISTOIRY:   Past Surgical History:  Procedure Laterality Date  . ABLATION  10/31/2013   CTI ablation by Dr Caryl Comes for atrial  flutter  . ATRIAL FLUTTER ABLATION N/A 10/31/2013   Procedure: ATRIAL FLUTTER ABLATION;  Surgeon: [name], MD;  Location: [facility] CATH LAB;  Service: Cardiovascular;  Laterality: N/A;  . CARDIAC CATHETERIZATION  07/2009   [facility]  . CARDIAC CATHETERIZATION  01/2013   [facility]  . CARDIAC CATHETERIZATION  03/2014   [facility]  . CARDIAC CATHETERIZATION N/A 11/02/2014   Procedure: Left Heart Cath and Cors/Grafts Angiography;  Surgeon: [name], MD;  Location: [facility] CV LAB;  Service: Cardiovascular;  Laterality: N/A;  . CARDIAC CATHETERIZATION N/A 11/02/2014   Procedure: Coronary Stent Intervention;  Surgeon: [name], MD;  Location: [facility] CV LAB;  Service: Cardiovascular;  Laterality: N/A;  . CARDIAC CATHETERIZATION N/A 11/03/2014   Procedure: Left Heart Cath and Coronary Angiography;   Surgeon: [name], MD;  Location: [facility] CV LAB;  Service: Cardiovascular;  Laterality: N/A;  . CARDIAC SURGERY  2011   Stents placed   . CATARACT EXTRACTION Left   . CATARACT EXTRACTION W/PHACO Left 06/01/2014   Procedure: CATARACT EXTRACTION PHACO AND INTRAOCULAR LENS PLACEMENT (IOC);  Surgeon: [name], MD;  Location: [name] ORS;  Service: Ophthalmology;  Laterality: Left;  . cataract surgery  2012  . colon surgery     cancer removal  . COLON SURGERY     colostomy  . COLONOSCOPY WITH PROPOFOL N/A 05/24/2016   Procedure: COLONOSCOPY WITH PROPOFOL;  Surgeon: [name], MD;  Location: [facility] ENDOSCOPY;  Service: Endoscopy;  Laterality: N/A;  . CORONARY ANGIOPLASTY    . INSERT / REPLACE / REMOVE PACEMAKER  2011   just a pacer  . KNEE ARTHROSCOPY Right 1980's  . LEFT HEART CATH AND CORONARY ANGIOGRAPHY N/A 02/08/2017   Procedure: LEFT HEART CATH AND CORONARY ANGIOGRAPHY;  Surgeon: [name], MD;  Location: [facility] CV LAB;  Service: Cardiovascular;  Laterality: N/A;  . PACEMAKER PLACEMENT  01/17/2010  . TOTAL KNEE ARTHROPLASTY Right 01/14/2016   Procedure: TOTAL KNEE ARTHROPLASTY;  Surgeon: [name], MD;  Location: [name] ORS;  Service: Orthopedics;  Laterality: Right;    SOCIAL HISTORY:   Social History   Tobacco Use  . Smoking status: Former Smoker    Packs/day: 1.00    Years: 30.00    Pack years: 30.00    Types: Cigarettes    Last attempt to quit: 07/26/2008    Years since quitting: 8.6  . Smokeless tobacco: Never Used  Substance Use Topics  . Alcohol use: No    FAMILY HISTORY:   Family History  Problem Relation Age of Onset  . Cancer Mother        'male cancer"  . Hypertension Mother   . Hyperlipidemia Mother   . Heart disease Father   . Heart attack Father   . Hypertension Father   . Hyperlipidemia Father     DRUG ALLERGIES:  No Known Allergies  REVIEW OF SYSTEMS:  CONSTITUTIONAL: No fever, fatigue or weakness.   EYES: No blurred or double vision.  EARS, NOSE, AND THROAT: No tinnitus or ear pain.  RESPIRATORY: Cough, shortness of breath, wheezing since this morning.   CARDIOVASCULAR: No chest pain, orthopnea, edema.  GASTROINTESTINAL: No nausea, vomiting, diarrhea or abdominal pain.  GENITOURINARY: No dysuria, hematuria.  ENDOCRINE: No polyuria, nocturia,  HEMATOLOGY: No anemia, easy bruising or bleeding SKIN: No rash or lesion. MUSCULOSKELETAL: No joint pain or arthritis.   NEUROLOGIC: No tingling, numbness, weakness.  PSYCHIATRY: No anxiety or depression.   MEDICATIONS AT HOME:  Prior to Admission medications   Medication Sig Start Date End Date Taking? Authorizing Provider  albuterol (PROAIR HFA) 108 (90 Base) MCG/ACT inhaler Inhale 2 puffs into the lungs every 4 (four) hours as needed for wheezing or shortness of breath. 07/11/16  Yes Flora Lipps, MD  albuterol (PROVENTIL) (2.5 MG/3ML) 0.083% nebulizer solution Take 3 mLs (2.5 mg total) by nebulization every 6 (six) hours as needed for wheezing or shortness of breath. DX:J44.9 10/04/16  Yes Flora Lipps, MD  amiodarone (PACERONE) 200 MG tablet Take 200 mg by mouth daily. 02/26/17 03/28/17 Yes [provider]  apixaban (ELIQUIS) 5 MG TABS tablet Take 5 mg by mouth 2 (two) times daily. 02/26/17  Yes [provider]  arformoterol (BROVANA) 15 MCG/2ML NEBU Take 2 mLs (15 mcg total) by nebulization 2 (two) times daily. Take 2 mLs (15 mcg total) by nebulization 2 (two) times daily. DX code 493.20 Every morning and 1800 07/11/16  Yes Flora Lipps, MD  aspirin EC 81 MG tablet Take 81 mg by mouth daily.   Yes [provider]  atorvastatin (LIPITOR) 80 MG tablet Take 1 tablet (80 mg total) by mouth daily. 05/09/16 03/12/18 Yes Wende Bushy, MD  budesonide (PULMICORT) 0.5 MG/2ML nebulizer solution Take 2 mLs (0.5 mg total) by nebulization 2 (two) times daily. DX code 493.20 Every morning and 1800 07/11/16  Yes Kasa, Maretta Bees, MD   budesonide-formoterol (SYMBICORT) 160-4.5 MCG/ACT inhaler Inhale 2 puffs into the lungs 2 (two) times daily.   Yes [provider]  Cholecalciferol (VITAMIN D) 2000 UNITS tablet Take 2,000 Units by mouth daily.   Yes [provider]  furosemide (LASIX) 20 MG tablet Take 1 tablet (20 mg total) by mouth daily. 03/14/17  Yes Gladstone Lighter, MD  guaiFENesin (MUCINEX) 600 MG 12 hr tablet Take 1 tablet (600 mg total) by mouth 2 (two) times daily. 03/14/17  Yes Gladstone Lighter, MD  isosorbide mononitrate (IMDUR) 30 MG 24 hr tablet Take 1 tablet (30 mg total) by mouth daily. 03/14/17 03/14/18 Yes Gladstone Lighter, MD  meclizine (ANTIVERT) 12.5 MG tablet Take 1-2 tablets by mouth 3 (three) times daily as needed. 01/05/17  Yes [provider]  metoprolol tartrate (LOPRESSOR) 25 MG tablet Take 1 tablet (25 mg total) by mouth 2 (two) times daily. 03/14/17  Yes [place]stone Lighter, MD  nitroGLYCERIN (NITROSTAT) 0.4 MG SL tablet Place 1 tablet (0.4 mg total) under the tongue every 5 (five) minutes as needed for chest pain. 05/09/16  Yes Wende Bushy, MD  omeprazole (PRILOSEC) 40 MG capsule Take 40 mg by mouth daily.   Yes [provider]  PARoxetine (PAXIL) 30 MG tablet Take 30 mg by mouth daily.    Yes [provider]  predniSONE (STERAPRED UNI-PAK 21 TAB) 10 MG (21) TBPK tablet 6 tabs PO x 1 day 5 tabs PO x 1 day 4 tabs PO x 1 day 3 tabs PO x 1 day 2 tabs PO x 1 day 1 tab PO x 1 day and stop 03/14/17  Yes [place]stone Lighter, MD  Tiotropium Bromide Monohydrate (SPIRIVA RESPIMAT) 1.25 MCG/ACT AERS Inhale 2 puffs into the lungs 2 (two) times daily. 07/11/16  Yes Flora Lipps, MD  traMADol (ULTRAM) 50 MG tablet Take 1-2 tablets (50-100 mg total) by mouth every 4 (four) hours as needed for moderate pain. Patient taking differently: Take 50 mg by mouth every 6 (six) hours as needed for moderate pain.  01/16/16  Yes Gaines, [place]  SIGNS:  Blood  pressure (!) 172/84, pulse 74, temperature 97.8 F (36.6 C), temperature source Oral, resp. rate 18, height 5\' 9"  (1.753 m), weight 99.3 kg (218 lb 14.7 oz), SpO2 99 %.  PHYSICAL EXAMINATION:  GENERAL:  72 y.o.-year-old patient lying in the bed with no acute distress.  EYES: Pupils equal, round, reactive to light and accommodation. No scleral icterus. Extraocular muscles intact.  HEENT: Head atraumatic, normocephalic. Oropharynx and nasopharynx clear.  NECK:  Supple, no jugular venous distention. No thyroid enlargement, no tenderness.  LUNGS: Faint expiratory wheeze bilaterally. CARDIOVASCULAR: S1, S2 normal. No murmurs, rubs, or gallops.  ABDOMEN: Soft, nontender, nondistended. Bowel sounds present. No organomegaly or mass.  EXTREMITIES: No pedal edema, cyanosis, or clubbing.  NEUROLOGIC: Cranial nerves II through XII are intact. Muscle strength 5/5 in all extremities. Sensation intact. Gait not checked.  PSYCHIATRIC: The patient is alert and oriented x 3.  SKIN: No obvious rash, lesion, or ulcer.   LABORATORY PANEL:   CBC Recent Labs  Lab 03/19/17 0452  WBC 7.4  HGB 11.2*  HCT 33.5*  PLT 275   ------------------------------------------------------------------------------------------------------------------  Chemistries  Recent Labs  Lab 03/19/17 0452  NA 137  K 4.6  CL 107  CO2 21*  GLUCOSE 182*  BUN 31*  CREATININE 1.03  CALCIUM 8.4*  AST 102*  ALT 87*  ALKPHOS 128*  BILITOT 0.6   ------------------------------------------------------------------------------------------------------------------  Cardiac Enzymes Recent Labs  Lab 03/19/17 0452  TROPONINI 0.03*   ------------------------------------------------------------------------------------------------------------------  RADIOLOGY:  Dg Chest Portable 1 View  Result Date: 03/19/2017 CLINICAL DATA:  Shortness of breath. EXAM: PORTABLE CHEST 1 VIEW COMPARISON:  Radiographs 03/12/2017 FINDINGS: Left-sided  pacemaker in place. Post median sternotomy and CABG. Cardiomegaly is similar to prior exam. Unchanged mediastinal contours with aortic atherosclerosis. Mild peribronchial thickening with some improvement from prior. Mild bibasilar atelectasis, left greater than right. No confluent airspace disease. No large pleural effusion. No pneumothorax. IMPRESSION: Mild peribronchial thickening which may be pulmonary edema or bronchitic change, improved from exam 1 week prior. Unchanged cardiomegaly.  Aortic atherosclerosis. Electronically Signed   By: Jeb Levering M.D.   On: 03/19/2017 05:29    EKG:   Orders placed or performed during the hospital encounter of 03/19/17  . EKG 12-Lead  . EKG 12-Lead  . ED EKG  . ED EKG    IMPRESSION AND PLAN:   #1 .acute respiratory failure with hypoxia due to COPD exacerbation: The, mild CHF exacerbation: Admit observation status, start IV steroids, bronchodilators, oxygen, Lasix see how he does. 2.  Coronary artery disease with recent bypass surgery patient had CABG x3 at [facility], patient on amiodarone, apixaban, aspirin, statins, metoprolol: Continue them.  Monitor on telemetry. 3.  Anxiety: Continue Paxil 30 mg.  Daily. 4.  History of COPD: Continue Symbicort, Spiriva, bronchodilators. 5.  Coronary artery disease with CABG,  stopped r Lasix as per medical records.  Will give extra Lasix for couple of days.  possible Discharge tomorrow.  All the records are reviewed and case discussed with ED provider. Management plans discussed with the patient, family and they are in agreement.  CODE STATUS: Full code  TOTAL TIME TAKING CARE OF THIS PATIENT: 55 minutes.    Epifanio Lesches M.D on 03/19/2017 at 11:17 AM  Between 7am to 6pm - Pager - [phone]  After 6pm go to [email or web address] - password [email or web address] [facility]  Office  [phone]  CC: Primary care physician; Sofie Hartigan, MD  Note: This dictation was prepared with  Dragon  dictation along with smaller Company secretary. Any transcriptional errors that result from this process are unintentional.

## 2017-03-19 NOTE — ED Notes (Signed)
Date and time results received: 03/19/17 0616  Test: Lactic Acid Critical Value: 2.1  Test: Troponin Critical Value: 0.03   Name of Provider Notified: Dahlia Client  Orders Received? Or Actions Taken?: None at this time

## 2017-03-19 NOTE — Progress Notes (Addendum)
Patient 72 year old who presented to the ER with SOB.  Patient with hx of HTN, CABG x 3 at [facility] on February 13, 2017 -  4 weeks ago.  While in ED patient received BiPAP for 2 hours due to oxygen saturations of 70% per EMS.  Patient with labored breathing and wheezing upon presentation to ED.  Patient is a former smoker.    Active problems this admission:   1.  Acute Respiratory Failure with hypoxia due to COPD exacerbation and CHF exacerbation.  EF from echo performed on 03/13/2017 was 35-40%.  CHF - acute diastolic.   2. CAD with recent bypass surgery at [facility].   3. Anxiety 4. History of COPD 5. CAD with CABG - Lasix had been stopped according to medical records.    CHF Education:?? Educational session with patient completed.  Provided patient with "Living Better with Heart Failure" packet. Briefly reviewed definition of heart failure and signs and symptoms of an exacerbation.?Explained to patient that HF is a chronic illness which requires self-assessment / self-management along with help from the cardiologist/PCP.? ? *Reviewed importance of and reason behind checking weight daily in the AM, after using the bathroom, but before getting dressed. NOTE:  Patient has scales.  Patient informed this RN that he had been weighing himself daily, but he thought the 4 pound weight gain was due to the prednisone he was taking for his breathing.   Reviewed the following information with patient:  *Discussed when to call the Dr= weight gain of >2-3lb overnight of 5lb in a week,  *Discussed yellow zone= call MD: weight gain of >2-3lb overnight of 5lb in a week, increased swelling, increased SOB when lying down, chest discomfort, dizziness, increased fatigue *Red Zone= call 911: struggle to breath, fainting or near fainting, significant chest pain   *Reviewed low sodium diet-provided handout of recommended and not recommended foods.  Reviewed reading labels with patient. Patient given the list from the [place] - "The  Salty Six" - six popular foods can add high levels of sodium to your diet.  Reviewed these foods with patient.  Discussed fluid intake with patient as well. Patient not currently on a fluid restriction, but advised no more than 8-8 ounces glass of fluids per day.?Dietitian Consultation ordered for education on low sodium heart healthy diet.    *Instructed patient to take medications as prescribed for heart failure. Explained briefly why pt is on the medications (either make you feel better, live longer or keep you out of the hospital) and discussed monitoring and side effects.   *Discussed exercise.  Patient reported that he has been so SOB.  Patient is not on home oxygen.  Patient is deconditioned due to his recent cardiac bypass surgery. Patient informed this RN that he was supposed to have had Home Health RN and PT to come out to his home after he was discharged from in-patient rehab [facility]), but he has not heard from or seen anyone yet.  RNCM notified.  Patient to have in-home RN/PT services.  Apparently, there is a problem with patient's cell phone and the [facility] was calling the wrong number.  CM is getting this sorted out.  Patient is unable to drive until after March 4th, 2019.  Patient will have in-home PT.  Once in-home PT is completed patient will be a candidate for Cardiac Rehab.  Patient has not been released from [facility] yet.  Overview of Cardiac Rehab provided.  Cardiac Rehab brochure given alond with informational  sheet and CPT billing codes for Cardiac Rehab.  I also informed patient after completing Cardiac Rehab he would be a candidate for Pulmonary Rehab.  Patient expressed interest in both of these programs.  Patient encouraged to remain as active as possible.  ? *Smoking Cessation?- Patient is a former smoker.?  *[name] Failure [facility] - Role of [facility] HF Clinic discussed.  Explained to patient the HF Clinic does not replace his cardiologist nor primary care physician, but  simply provides an additional resource for him to help his to manage his heart failure.  Heart Failure Clinic Appointment scheduled for March 22, 2017 at 9:40 a.m. Patient agreeable to being followed in Heart Failure Clinic. Directions provided. Patient also wanted to know when his appointment with Dr. Clayborn Bigness was scheduled for as he could not remember.  This RN contacted [facility] Cardiology.  Patient's appointment with Dr. Clayborn Bigness is scheduled for March 27, 2017 at 10:30 a.m.  Patient informed.    Again,  the 5 Steps to Living Better with Heart Failure were reviewed with patient. ?  Patient thanked me for providing the above information.   Roanna Epley, RN, BSN, [facility]? [place] Cardiac &?Pulmonary Rehab  Cardiovascular &?Pulmonary Nurse Navigator  Direct Line: [phone]  Department Phone #: [phone] Fax: [phone]? Email Address: [email or web address]??    ?

## 2017-03-19 NOTE — ED Provider Notes (Signed)
[facility] Emergency Department Provider Note   ____________________________________________   First MD Initiated Contact with Patient 03/19/17 [phone]     (approximate)  I have reviewed the triage vital signs and the nursing notes.   HISTORY  Chief Complaint Shortness of Breath    HPI Donald Juarez is a 72 y.o. male who comes into the hospital today with some shortness of breath and wheezing.  The patient had a CABG 4 weeks ago when he has a history of COPD.  He was woken up out of his sleep feeling short of breath.  He called the ambulance and he was found to have oxygen levels in the 70s.  The patient received 2 DuoNeb's by EMS.  He was admitted 2 weeks ago with the same thing.  He had a COPD exacerbation.  The patient reports that he normally does get short of breath but this seemed to be worse.  The patient has had a cough that is nonproductive and no fevers.  He denies any chest pain or nausea or vomiting.  He had his bypass at [facility] and he reports that he has not yet followed back up with his surgeon.  The patient is here for evaluation this morning.  Past Medical History:  Diagnosis Date  . Anginal pain ([place])    feels this as jaw pain.  takes imdur for this. rarely uses nitro.  [name] Anxiety   . Arthritis   . Asthma   . Bilateral claudication of lower limb ([place])   . Carotid stenosis    a. 07/2013 Carotid U/S: 100% RICA (pt prev reported h/o 35%), LICA 57-32%, bilat ECA 50%, normal vertebrals and subclavians bilat-->F/U needed in 01/2014.  . Cataract of left eye   . Cataract, right    a. 10/2010 s/p cataract surgery   . COPD (chronic obstructive pulmonary disease) ([place])   . Coronary artery disease    a. 06/1996 & 03/2005 Cath: non-obs dzs;  b. 07/2009 NSTEMI/Cath: LCX 99->PCI/DES extending into OM1;  c. 01/2013 Cath: patent LCX stent-->Med Rx.; d. cath 03/2014: RPAV dzs->Med rx; e. 10/2014 Cath/PCI: LM 30, LAD 20p/m, LCX 50ost/p, 10 ISR, 60d, OM1 min irregs,  OM2 50, RCA 30ost, 62m, 60d, RPDA 70(2.5x8 Xience DES), RPAV 90small(PTCA); f. 10/2014 Relook Cath: RPAV 99(Med Rx), EF 45-50.  [name] Dysrhythmia    since ablation, flutter resolved.  . Essential hypertension   . Hyperkalemia   . Hyperlipidemia   . Myocardial infarction ([place]) 2015   had stent inserted and then had heart attack after. stent inserted due to blockage  . Paroxysmal atrial flutter ([place])    a. CHA2DS2VASc = 3 -> eliquis;  b. 07/2013 Echo: EF 50-55%, mildly dil LA.c. s/p RFCA 10/31/2013 by Dr Caryl Comes  . Prostate cancer ([place])   . Pulmonary nodule, right    a. 0.7cm RLL - stable by CT 06/2013.  [name] Rectal cancer ([place])    a. Status post colostomy  . Seizures ([place])    a. last 30 years ago  . Shingles    a. 09/2012.  . SSS (sick sinus syndrome) ([place])    a. 12/2009 S/P MDT Adapta ADDR01 DC PPM, ser # [id number] ([name]).  . Syncope and collapse     Patient Active Problem List   Diagnosis Date Noted  . Acute CHF (congestive heart failure) ([place]) 03/12/2017  . Primary localized osteoarthritis of right knee 01/14/2016  . Essential hypertension   . Hyperlipidemia   . SSS (sick sinus syndrome) (  [place])   . Unstable angina ([place]) 11/03/2014  . NSTEMI (non-ST elevated myocardial infarction) ([place])   . Effort angina ([place]) 11/02/2014  . S/P PTCA (percutaneous transluminal coronary angioplasty) 11/02/2014  . Coronary artery disease involving native coronary artery   . Prostate cancer ([place])   . Anxiety and depression 06/18/2014  . Fatigue 11/14/2013  . Typical atrial flutter ([place]) 10/31/2013  . Bilateral claudication of lower limb ([place])   . Coronary artery disease   . Paroxysmal atrial flutter ([place])   . Hypertension   . Carotid stenosis   . COPD, severe GOLD GRADE D 03/28/2013  . Solitary pulmonary nodule 03/28/2013    Past Surgical History:  Procedure Laterality Date  . ABLATION  10/31/2013   CTI ablation by Dr Caryl Comes for atrial flutter  . ATRIAL FLUTTER ABLATION N/A 10/31/2013    Procedure: ATRIAL FLUTTER ABLATION;  Surgeon: [name], MD;  Location: [facility] CATH LAB;  Service: Cardiovascular;  Laterality: N/A;  . CARDIAC CATHETERIZATION  [facility]   [facility]  . CARDIAC CATHETERIZATION  01/2013   [facility]  . CARDIAC CATHETERIZATION  03/2014   [facility]  . CARDIAC CATHETERIZATION N/A 11/02/2014   Procedure: Left Heart Cath and Cors/Grafts Angiography;  Surgeon: [name], MD;  Location: [place] CV LAB;  Service: Cardiovascular;  Laterality: N/A;  . CARDIAC CATHETERIZATION N/A 11/02/2014   Procedure: Coronary Stent Intervention;  Surgeon: [name], MD;  Location: [place] CV LAB;  Service: Cardiovascular;  Laterality: N/A;  . CARDIAC CATHETERIZATION N/A 11/03/2014   Procedure: Left Heart Cath and Coronary Angiography;  Surgeon: [name], MD;  Location: [place] CV LAB;  Service: Cardiovascular;  Laterality: N/A;  . CARDIAC SURGERY  2011   Stents placed   . CATARACT EXTRACTION Left   . CATARACT EXTRACTION W/PHACO Left 06/01/2014   Procedure: CATARACT EXTRACTION PHACO AND INTRAOCULAR LENS PLACEMENT (IOC);  Surgeon: [name], MD;  Location: [name] ORS;  Service: Ophthalmology;  Laterality: Left;  . cataract surgery  2012  . colon surgery     cancer removal  . COLON SURGERY     colostomy  . COLONOSCOPY WITH PROPOFOL N/A 05/24/2016   Procedure: COLONOSCOPY WITH PROPOFOL;  Surgeon: [name], MD;  Location: [facility] ENDOSCOPY;  Service: Endoscopy;  Laterality: N/A;  . CORONARY ANGIOPLASTY    . INSERT / REPLACE / REMOVE PACEMAKER  2011   just a pacer  . KNEE ARTHROSCOPY Right 1980's  . LEFT HEART CATH AND CORONARY ANGIOGRAPHY N/A 02/08/2017   Procedure: LEFT HEART CATH AND CORONARY ANGIOGRAPHY;  Surgeon: [name], MD;  Location: [place] CV LAB;  Service: Cardiovascular;  Laterality: N/A;  . PACEMAKER PLACEMENT  01/17/2010  . TOTAL KNEE ARTHROPLASTY Right 01/14/2016   Procedure: TOTAL KNEE ARTHROPLASTY;  Surgeon: [name],  MD;  Location: [name] ORS;  Service: Orthopedics;  Laterality: Right;    Prior to Admission medications   Medication Sig Start Date End Date Taking? Authorizing Provider  albuterol (PROAIR HFA) 108 (90 Base) MCG/ACT inhaler Inhale 2 puffs into the lungs every 4 (four) hours as needed for wheezing or shortness of breath. 07/11/16  Yes Flora Lipps, MD  albuterol (PROVENTIL) (2.5 MG/3ML) 0.083% nebulizer solution Take 3 mLs (2.5 mg total) by nebulization every 6 (six) hours as needed for wheezing or shortness of breath. DX:J44.9 10/04/16  Yes Flora Lipps, MD  amiodarone (PACERONE) 200 MG tablet Take 200 mg by mouth daily. 02/26/17 03/28/17 Yes [provider]  apixaban Arne Cleveland) 5  MG TABS tablet Take 5 mg by mouth 2 (two) times daily. 02/26/17  Yes [provider]  arformoterol (BROVANA) 15 MCG/2ML NEBU Take 2 mLs (15 mcg total) by nebulization 2 (two) times daily. Take 2 mLs (15 mcg total) by nebulization 2 (two) times daily. DX code 493.20 Every morning and 1800 07/11/16  Yes Flora Lipps, MD  aspirin EC 81 MG tablet Take 81 mg by mouth daily.   Yes [provider]  atorvastatin (LIPITOR) 80 MG tablet Take 1 tablet (80 mg total) by mouth daily. 05/09/16 03/12/18 Yes Wende Bushy, MD  budesonide (PULMICORT) 0.5 MG/2ML nebulizer solution Take 2 mLs (0.5 mg total) by nebulization 2 (two) times daily. DX code 493.20 Every morning and 1800 07/11/16  Yes Kasa, Maretta Bees, MD  budesonide-formoterol (SYMBICORT) 160-4.5 MCG/ACT inhaler Inhale 2 puffs into the lungs 2 (two) times daily.   Yes [provider]  Cholecalciferol (VITAMIN D) 2000 UNITS tablet Take 2,000 Units by mouth daily.   Yes [provider]  furosemide (LASIX) 20 MG tablet Take 1 tablet (20 mg total) by mouth daily. 03/14/17  Yes Gladstone Lighter, MD  guaiFENesin (MUCINEX) 600 MG 12 hr tablet Take 1 tablet (600 mg total) by mouth 2 (two) times daily. 03/14/17  Yes Gladstone Lighter, MD  isosorbide  mononitrate (IMDUR) 30 MG 24 hr tablet Take 1 tablet (30 mg total) by mouth daily. 03/14/17 03/14/18 Yes Gladstone Lighter, MD  meclizine (ANTIVERT) 12.5 MG tablet Take 1-2 tablets by mouth 3 (three) times daily as needed. 01/05/17  Yes [provider]  metoprolol tartrate (LOPRESSOR) 25 MG tablet Take 1 tablet (25 mg total) by mouth 2 (two) times daily. 03/14/17  Yes Gladstone Lighter, MD  nitroGLYCERIN (NITROSTAT) 0.4 MG SL tablet Place 1 tablet (0.4 mg total) under the tongue every 5 (five) minutes as needed for chest pain. 05/09/16  Yes Wende Bushy, MD  omeprazole (PRILOSEC) 40 MG capsule Take 40 mg by mouth daily.   Yes [provider]  PARoxetine (PAXIL) 30 MG tablet Take 30 mg by mouth daily.    Yes [provider]  predniSONE (STERAPRED UNI-PAK 21 TAB) 10 MG (21) TBPK tablet 6 tabs PO x 1 day 5 tabs PO x 1 day 4 tabs PO x 1 day 3 tabs PO x 1 day 2 tabs PO x 1 day 1 tab PO x 1 day and stop 03/14/17  Yes Gladstone Lighter, MD  Tiotropium Bromide Monohydrate (SPIRIVA RESPIMAT) 1.25 MCG/ACT AERS Inhale 2 puffs into the lungs 2 (two) times daily. 07/11/16  Yes Flora Lipps, MD  traMADol (ULTRAM) 50 MG tablet Take 1-2 tablets (50-100 mg total) by mouth every 4 (four) hours as needed for moderate pain. Patient taking differently: Take 50 mg by mouth every 6 (six) hours as needed for moderate pain.  01/16/16  Yes Duanne Guess, PA-C    Allergies Patient has no known allergies.  Family History  Problem Relation Age of Onset  . Cancer Mother        'male cancer"  . Hypertension Mother   . Hyperlipidemia Mother   . Heart disease Father   . Heart attack Father   . Hypertension Father   . Hyperlipidemia Father     Social History Social History   Tobacco Use  . Smoking status: Former Smoker    Packs/day: 1.00    Years: 30.00    Pack years: 30.00    Types: Cigarettes    Last attempt to quit: 07/26/2008  Years since quitting: 8.6  . Smokeless tobacco:  Never Used  Substance Use Topics  . Alcohol use: No  . Drug use: No    Review of Systems  Constitutional: No fever/chills Eyes: No visual changes. ENT: No sore throat. Cardiovascular: Denies chest pain. Respiratory:  shortness of breath. Gastrointestinal: No abdominal pain.  No nausea, no vomiting.  No diarrhea.  No constipation. Genitourinary: Negative for dysuria. Musculoskeletal: Negative for back pain. Skin: Negative for rash. Neurological: Negative for headaches, focal weakness or numbness.   ____________________________________________   PHYSICAL EXAM:  VITAL SIGNS: ED Triage Vitals  Enc Vitals Group     BP 03/19/17 0448 (!) 168/91     Pulse Rate 03/19/17 0448 73     Resp 03/19/17 0448 (!) 28     Temp --      Temp Source 03/19/17 0448 Oral     SpO2 03/19/17 0447 99 %     Weight 03/19/17 0450 212 lb (96.2 kg)     Height 03/19/17 0450 5\' 9"  (1.753 m)     Head Circumference --      Peak Flow --      Pain Score --      Pain Loc --      Pain Edu? --      Excl. in [place]? --     Constitutional: Alert and oriented. Well appearing and in severe respiratory distress. Eyes: Conjunctivae are normal. PERRL. EOMI. Head: Atraumatic. Nose: No congestion/rhinnorhea. Mouth/Throat: Mucous membranes are moist.  Oropharynx non-erythematous. Cardiovascular: Normal rate, regular rhythm. Grossly normal heart sounds.  Good peripheral circulation. Respiratory: Increased respiratory effort.  No retractions. Expiratory wheezes in all lung fields. Gastrointestinal: Soft and nontender. No distention. Positive bowel sounds Musculoskeletal: No lower extremity tenderness nor edema.  Neurologic:  Normal speech and language.  Skin:  Skin is warm, dry and intact.  Psychiatric: Mood and affect are normal.   ____________________________________________   LABS (all labs ordered are listed, but only abnormal results are displayed)  Labs Reviewed  CBC - Abnormal; Notable for the following  components:      Result Value   RBC 3.55 (*)    Hemoglobin 11.2 (*)    HCT 33.5 (*)    RDW 15.4 (*)    All other components within normal limits  COMPREHENSIVE METABOLIC PANEL - Abnormal; Notable for the following components:   CO2 21 (*)    Glucose, Bld 182 (*)    BUN 31 (*)    Calcium 8.4 (*)    Albumin 3.3 (*)    AST 102 (*)    ALT 87 (*)    Alkaline Phosphatase 128 (*)    All other components within normal limits  TROPONIN I - Abnormal; Notable for the following components:   Troponin I 0.03 (*)    All other components within normal limits  BLOOD GAS, VENOUS - Abnormal; Notable for the following components:   pH, Ven 7.20 (*)    pCO2, Ven 40 (*)    Bicarbonate 15.6 (*)    Acid-base deficit 11.6 (*)    All other components within normal limits  LACTIC ACID, PLASMA - Abnormal; Notable for the following components:   Lactic Acid, Venous 2.1 (*)    All other components within normal limits  BRAIN NATRIURETIC PEPTIDE - Abnormal; Notable for the following components:   B Natriuretic Peptide 557.0 (*)    All other components within normal limits  LACTIC ACID, PLASMA  PROTIME-INR   ____________________________________________  EKG  ED ECG REPORT I, Loney Hering, the attending physician, personally viewed and interpreted this ECG.   Date: 03/19/2017  EKG Time: 448  Rate: 74  Rhythm: normal sinus rhythm, LBBB  Axis: normal  Intervals:left bundle branch block  ST&T Change: flipped t waves diffusely with st depression in lead II, III, avf  ____________________________________________  RADIOLOGY  ED MD interpretation:  CXR: no pneumonia, peribronchial thickening which may be pulmonary edema or bronchitic change  Official radiology report(s): Dg Chest Portable 1 View  Result Date: 03/19/2017 CLINICAL DATA:  Shortness of breath. EXAM: PORTABLE CHEST 1 VIEW COMPARISON:  Radiographs 03/12/2017 FINDINGS: Left-sided pacemaker in place. Post median sternotomy and  CABG. Cardiomegaly is similar to prior exam. Unchanged mediastinal contours with aortic atherosclerosis. Mild peribronchial thickening with some improvement from prior. Mild bibasilar atelectasis, left greater than right. No confluent airspace disease. No large pleural effusion. No pneumothorax. IMPRESSION: Mild peribronchial thickening which may be pulmonary edema or bronchitic change, improved from exam 1 week prior. Unchanged cardiomegaly.  Aortic atherosclerosis. Electronically Signed   By: Jeb Levering M.D.   On: 03/19/2017 05:29    ____________________________________________   PROCEDURES  Procedure(s) performed: please, see procedure note(s).  .Critical Care Performed by: Loney Hering, MD Authorized by: Loney Hering, MD   Critical care provider statement:    Critical care time (minutes):  30   Critical care start time:  03/19/2017 4:43 AM   Critical care end time:  03/19/2017 5:13 AM   Critical care time was exclusive of:  Separately billable procedures and treating other patients   Critical care was necessary to treat or prevent imminent or life-threatening deterioration of the following conditions:  Respiratory failure   Critical care was time spent personally by me on the following activities:  Development of treatment plan with patient or surrogate, evaluation of patient's response to treatment, examination of patient, obtaining history from patient or surrogate, ordering and performing treatments and interventions, ordering and review of laboratory studies, ordering and review of radiographic studies, pulse oximetry, re-evaluation of patient's condition and review of old charts   I assumed direction of critical care for this patient from another provider in my specialty: no      Critical Care performed: Yes, see critical care note(s)  ____________________________________________   INITIAL IMPRESSION / ASSESSMENT AND PLAN / ED COURSE  As part of my medical  decision making, I reviewed the following data within the electronic MEDICAL RECORD NUMBER Notes from prior ED visits and [place] Controlled Substance Database   This is a 72 year old male who comes into the hospital today with some shortness of breath and wheezing.  My differential diagnosis includes COPD, CHF, pneumonia.  The patient was wheezing when he arrived.  He had already received 2 DuoNeb's.  I ordered another DuoNeb as well as some Solu-Medrol and magnesium sulfate.  I then decided to place the patient on BiPAP.  He had a recent cardiac bypass surgery and I did not want to put any further strain in his heart.  The patient is not having chest pain at this time.  The patient's white blood cell count is unremarkable but his pH is 7.2.  The patient's lactic acid is 2.1.  His chest x-ray does not show any pneumonia.  I will reassess the patient.  He will need to be admitted for respiratory distress and the need of BiPAP.     Patient received his medications and did well on BiPAP.  His wheezes improved  significantly and he was sleeping comfortably.  I will admit the patient to the hospitalist service.  I feel that he may tolerate a trial off of BiPAP.  We will take him off the BiPAP to see if the patient can be admitted to the floor.  I discussed this with the hospitalist and he agreed. ____________________________________________   FINAL CLINICAL IMPRESSION(S) / ED DIAGNOSES  Final diagnoses:  Shortness of breath  COPD exacerbation (HCC)  Acute on chronic congestive heart failure, unspecified heart failure type [facility])     ED Discharge Orders    None       Note:  This document was prepared using Dragon voice recognition software and may include unintentional dictation errors.    Loney Hering, MD 03/19/17 [id number]

## 2017-03-19 NOTE — Care Management (Addendum)
Placed in observation for hypoxia due to exac copd. Patient was transferred to [facility] 1.11 for CABG. After surgery he was discharged to [facility].  Discharged home 03/09/2017 with referral for home health through [facility].  patient was readmitted to [facility] 2/11 before agency could make home visit.  He discharged again 2/13 with home health.  Patient mentions that agency has not visited.  Per [place] with [facility], agency has called patient daily and left voicemail messages [phone]. Patient says this is not corrected cell phone number. New number is [phone].  Notified [facility] and asked them to do a drive by if unable to contact patient by phone. Patient relayed to leadership during rounding that he did not get his prednisone filled until 24 hours after last discharge because he could not get to the drug store. Patient did not mention concern obtaining the medication to any staff member.  Patient's brother in law picked up the prednisone 2/14 for patient.  Dicussed with patient that [facility] will deliver medications should he wish to switch from [employer] who does not deliver to [facility].  Provided patient with phone number to [facility] liaison to contact if does not hear from the agency within 24 hours. Patient's need for supplemental oxygen is acute.  Reached out to attending for order for overnight oximetry on room air to see if patient would qualify for nocturnal 02.  Will also have him assessed for need of continuous oxygen prior to discahrge

## 2017-03-19 NOTE — ED Notes (Signed)
Informed RT that I would be sending VBG to lab

## 2017-03-19 NOTE — ED Triage Notes (Signed)
Pt arrived via ems from home with complaints of shortness of breath that started today with no relief from his inhaler.  Pt has history of COPD and recently has heart surgery at [facility] 4 weeks ago.  FD found patient with 70% oxygen saturation and EMS administered 2 duo nebs in route.  Patient is 100% on 15L NRB.  Upon arrival pt's breathing labored and wheezing heard on auscultation.  MD present during triage.

## 2017-03-20 LAB — CBC
HEMATOCRIT: 33.6 % — AB (ref 40.0–52.0)
Hemoglobin: 11.1 g/dL — ABNORMAL LOW (ref 13.0–18.0)
MCH: 30.7 pg (ref 26.0–34.0)
MCHC: 33 g/dL (ref 32.0–36.0)
MCV: 93.1 fL (ref 80.0–100.0)
PLATELETS: 284 10*3/uL (ref 150–440)
RBC: 3.61 MIL/uL — AB (ref 4.40–5.90)
RDW: 14.9 % — ABNORMAL HIGH (ref 11.5–14.5)
WBC: 12.9 10*3/uL — AB (ref 3.8–10.6)

## 2017-03-20 LAB — GLUCOSE, CAPILLARY: Glucose-Capillary: 87 mg/dL (ref 65–99)

## 2017-03-20 LAB — BASIC METABOLIC PANEL
Anion gap: 9 (ref 5–15)
BUN: 30 mg/dL — ABNORMAL HIGH (ref 6–20)
CHLORIDE: 104 mmol/L (ref 101–111)
CO2: 24 mmol/L (ref 22–32)
CREATININE: 0.92 mg/dL (ref 0.61–1.24)
Calcium: 8.4 mg/dL — ABNORMAL LOW (ref 8.9–10.3)
GFR calc non Af Amer: >60 mL/min (ref >60)
Glucose, Bld: 96 mg/dL (ref 65–99)
Potassium: 4.3 mmol/L (ref 3.5–5.1)
SODIUM: 137 mmol/L (ref 135–145)

## 2017-03-20 LAB — EXPECTORATED SPUTUM ASSESSMENT W GRAM STAIN, RFLX TO RESP C

## 2017-03-20 LAB — EXPECTORATED SPUTUM ASSESSMENT W REFEX TO RESP CULTURE

## 2017-03-20 MED ORDER — SODIUM CHLORIDE 0.9 % IV SOLN
500.0000 mg | INTRAVENOUS | Status: DC
  Administered 2017-03-20: 500 mg via INTRAVENOUS
  Filled 2017-03-20 (×2): qty 500

## 2017-03-20 MED ORDER — OCUVITE-LUTEIN PO CAPS
1.0000 | ORAL_CAPSULE | Freq: Every day | ORAL | Status: DC
  Administered 2017-03-20 – 2017-03-23 (×4): 1 via ORAL
  Filled 2017-03-20 (×4): qty 1

## 2017-03-20 MED ORDER — AMOXICILLIN-POT CLAVULANATE 875-125 MG PO TABS
1.0000 | ORAL_TABLET | Freq: Two times a day (BID) | ORAL | Status: DC
  Administered 2017-03-20 – 2017-03-21 (×3): 1 via ORAL
  Filled 2017-03-20 (×3): qty 1

## 2017-03-20 MED ORDER — PREMIER PROTEIN SHAKE: 11.0000 [oz_av] | Freq: Two times a day (BID) | ORAL | Status: DC

## 2017-03-20 NOTE — Progress Notes (Signed)
SATURATION QUALIFICATIONS: (This note is used to comply with regulatory documentation for home oxygen)  Patient Saturations on Room Air at Rest = *96%  Patient Saturations on Room Air while Ambulating = 93%  Patient Saturations on n/a Liters of oxygen while Ambulating = n/a  Please briefly explain why patient needs home oxygen:n/a 

## 2017-03-20 NOTE — Progress Notes (Signed)
[place] at [place] NAME: Donald Juarez    MR#:  [id number]  DATE OF BIRTH:  Jun 05, 1945  SUBJECTIVE: Patient admitted yesterday for shortness of breath, COPD, CHF exacerbation.  Having thick yellow phlegm today and want to stay in the hospital.  CHIEF COMPLAINT:   Chief Complaint  Patient presents with  . Shortness of Breath    REVIEW OF SYSTEMS:   ROS CONSTITUTIONAL: No fever, fatigue or weakness.  EYES: No blurred or double vision.  EARS, NOSE, AND THROAT: No tinnitus or ear pain.  RESPIRATORY: Complains of cough, shortness of breath more than usual.   CARDIOVASCULAR: No chest pain, orthopnea, edema.  GASTROINTESTINAL: No nausea, vomiting, diarrhea or abdominal pain.  GENITOURINARY: No dysuria, hematuria.  ENDOCRINE: No polyuria, nocturia,  HEMATOLOGY: No anemia, easy bruising or bleeding SKIN: No rash or lesion. MUSCULOSKELETAL: No joint pain or arthritis.   NEUROLOGIC: No tingling, numbness, weakness.  PSYCHIATRY: No anxiety or depression.   DRUG ALLERGIES:  No Known Allergies  VITALS:  Blood pressure (!) 112/55, pulse 62, temperature 98 F (36.7 C), temperature source Oral, resp. rate 18, height 5\' 9"  (1.753 m), weight 96.7 kg (213 lb 1.6 oz), SpO2 93 %.  PHYSICAL EXAMINATION:  GENERAL:  72 y.o.-year-old patient lying in the bed with no acute distress.  EYES: Pupils equal, round, reactive to light and accommodation. No scleral icterus. Extraocular muscles intact.  HEENT: Head atraumatic, normocephalic. Oropharynx and nasopharynx clear.  NECK:  Supple, no jugular venous distention. No thyroid enlargement, no tenderness.  LUNGS: Normal breath sounds bilaterally, no wheezing, rales,rhonchi or crepitation. No use of accessory muscles of respiration.  CARDIOVASCULAR: S1, S2 normal. No murmurs, rubs, or gallops.  ABDOMEN: Soft, nontender, nondistended. Bowel sounds present. No organomegaly or mass.  EXTREMITIES: No pedal  edema, cyanosis, or clubbing.  NEUROLOGIC: Cranial nerves II through XII are intact. Muscle strength 5/5 in all extremities. Sensation intact. Gait not checked.  PSYCHIATRIC: The patient is alert and oriented x 3.  SKIN: No obvious rash, lesion, or ulcer.    LABORATORY PANEL:   CBC Recent Labs  Lab 03/20/17 0659  WBC 12.9*  HGB 11.1*  HCT 33.6*  PLT 284   ------------------------------------------------------------------------------------------------------------------  Chemistries  Recent Labs  Lab 03/19/17 0452 03/20/17 0659  NA 137 137  K 4.6 4.3  CL 107 104  CO2 21* 24  GLUCOSE 182* 96  BUN 31* 30*  CREATININE 1.03 0.92  CALCIUM 8.4* 8.4*  AST 102*  --   ALT 87*  --   ALKPHOS 128*  --   BILITOT 0.6  --    ------------------------------------------------------------------------------------------------------------------  Cardiac Enzymes Recent Labs  Lab 03/19/17 0452  TROPONINI 0.03*   ------------------------------------------------------------------------------------------------------------------  RADIOLOGY:  Dg Chest Portable 1 View  Result Date: 03/19/2017 CLINICAL DATA:  Shortness of breath. EXAM: PORTABLE CHEST 1 VIEW COMPARISON:  Radiographs 03/12/2017 FINDINGS: Left-sided pacemaker in place. Post median sternotomy and CABG. Cardiomegaly is similar to prior exam. Unchanged mediastinal contours with aortic atherosclerosis. Mild peribronchial thickening with some improvement from prior. Mild bibasilar atelectasis, left greater than right. No confluent airspace disease. No large pleural effusion. No pneumothorax. IMPRESSION: Mild peribronchial thickening which may be pulmonary edema or bronchitic change, improved from exam 1 week prior. Unchanged cardiomegaly.  Aortic atherosclerosis. Electronically Signed   By: Jeb Levering M.D.   On: 03/19/2017 05:29    EKG:   Orders placed or performed during the hospital encounter of 03/19/17  . EKG 12-Lead  .  EKG  12-Lead  . ED EKG  . ED EKG    ASSESSMENT AND PLAN:   1.  Acute respiratory failure with hypoxia on admission secondary to COPD exacerbation, acute on chronic systolic heart failure.  Continue IV steroids, small dose IV Lasix, bronchodilators, started on empiric antibiotics, did not call her home oxygen.  Patient feels shortness of breath more than before likely secondary to underlying bronchitis and also CHF: Check sputum cultures. 2.  History of CAD, CABG: Continue routine wound care precautions, seen by wound care nurse, daily dressing changes are recommended.:  Continue aspirin, apixaban, statins, metoprolol, amiodarone.    3.  Anxiety: Continue Xanax.      All the records are reviewed and case discussed with Care Management/Social Workerr. Management plans discussed with the patient, family and they are in agreement.  CODE STATUS: full  TOTAL TIME TAKING CARE OF THIS PATIENT: 35 minutes.   POSSIBLE D/C IN 1-2DAYS, DEPENDING ON CLINICAL CONDITION.   Epifanio Lesches M.D on 03/20/2017 at 5:16 PM  Between 7am to 6pm - Pager - [phone]  After 6pm go to [email or web address] - password [email or web address] [facility]  Office  [phone]  CC: Primary care physician; Sofie Hartigan, MD   Note: This dictation was prepared with Dragon dictation along with smaller phrase technology. Any transcriptional errors that result from this process are unintentional.

## 2017-03-20 NOTE — Consult Note (Signed)
[place] Nurse wound consult note Reason for Consult: Recent CABG at [facility] with healing left groin access wound.  5% yellow slough noted to wound bed today.  Patient has been placing a dry gauze dressing over this.  No packing to wound depth.  We discussed this at length.  Wound type:surgical Pressure Injury POA: NA Measurement: 0.5 cm x 0.3 cm x 0.3 cm  Wound bed:5% slough, unable to visualize wound bed due to size Drainage (amount, consistency, odor) minimal serosanguinous  No odor Periwound:intact Dressing procedure/placement/frequency:Cleanse left inguinal wound daily with NS.  Gently fill wound depth with Iodoform packing.  Cover with 2x2 and transparent film. Change daily.   Patient understands need to pack now and how to do this.  WIll continue this at home. SUpplies at bedside.  Will not follow at this time.  Please re-consult if needed.  Domenic Moras RN BSN CWON Pager [phone] :

## 2017-03-20 NOTE — Care Management (Signed)
patient had some desats during overnight oximetry but not enough to meet criteria for nocturnal 02

## 2017-03-20 NOTE — Progress Notes (Addendum)
Rounded on patient today to see if he had any questions and to check on how the low sodium heart healthy diet was going. Patient openly admitted that it was not going well as he ate things that are not compliant with low sodium diet today.  He stated, "I ate a hot dog, french fries, which I had my son bring in to me,  and some fish today." Reviewed foods with high sodium content and discussed what would have been better selections for him.   Again, this nurse stressed the importance of following low sodium diet.  Patient reported that he will try to do better.   Roanna Epley, RN, BSN, [facility] Cardiovascular and Pulmonary Nurse Navigator

## 2017-03-21 LAB — GLUCOSE, CAPILLARY: Glucose-Capillary: 103 mg/dL — ABNORMAL HIGH (ref 65–99)

## 2017-03-21 MED ORDER — AZITHROMYCIN 250 MG PO TABS
500.0000 mg | ORAL_TABLET | Freq: Every day | ORAL | Status: DC
  Administered 2017-03-21 – 2017-03-22 (×2): 500 mg via ORAL
  Filled 2017-03-21 (×2): qty 2

## 2017-03-21 NOTE — Care Management (Signed)
Remains in observation.  Patient with wheezing and more short of breath.  Added IV steroids and IV azithromycin and incentive spirometer. Anticipate discharge 03/22/2017

## 2017-03-21 NOTE — Progress Notes (Signed)
[name] NAME: Donald Juarez    MR#:  [id number]  DATE OF BIRTH:  May 29, 1945  SUBJECTIVE: Having shortness of breath, thick yellow phlegm no hypoxia.  CHIEF COMPLAINT:   Chief Complaint  Patient presents with  . Shortness of Breath    REVIEW OF SYSTEMS:   ROS CONSTITUTIONAL: No fever, fatigue or weakness.  EYES: No blurred or double vision.  EARS, NOSE, AND THROAT: No tinnitus or ear pain.  RESPIRATORY: Complains of cough, shortness of breath more than usual.   CARDIOVASCULAR: No chest pain, orthopnea, edema.  GASTROINTESTINAL: No nausea, vomiting, diarrhea or abdominal pain.  GENITOURINARY: No dysuria, hematuria.  ENDOCRINE: No polyuria, nocturia,  HEMATOLOGY: No anemia, easy bruising or bleeding SKIN: No rash or lesion. MUSCULOSKELETAL: No joint pain or arthritis.   NEUROLOGIC: No tingling, numbness, weakness.  PSYCHIATRY: No anxiety or depression.   DRUG ALLERGIES:  No Known Allergies  VITALS:  Blood pressure (!) 128/94, pulse 98, temperature (!) 97.5 F (36.4 C), temperature source Oral, resp. rate 18, height 5\' 9"  (1.753 m), weight 95.3 kg (210 lb 1.6 oz), SpO2 96 %.  PHYSICAL EXAMINATION:  GENERAL:  72 y.o.-year-old patient lying in the bed with no acute distress.  EYES: Pupils equal, round, reactive to light and accommodation. No scleral icterus. Extraocular muscles intact.  HEENT: Head atraumatic, normocephalic. Oropharynx and nasopharynx clear.  NECK:  Supple, no jugular venous distention. No thyroid enlargement, no tenderness.  LUNGS: faint expiratory wheeze bilaterally.[name]  CARDIOVASCULAR: S1, S2 normal. No murmurs, rubs, or gallops.  ABDOMEN: Soft, nontender, nondistended. Bowel sounds present. No organomegaly or mass.  EXTREMITIES: No pedal edema, cyanosis, or clubbing.  NEUROLOGIC: Cranial nerves II through XII are intact. Muscle strength 5/5 in all extremities. Sensation intact. Gait not checked.   PSYCHIATRIC: The patient is alert and oriented x 3.  SKIN: No obvious rash, lesion, or ulcer.    LABORATORY PANEL:   CBC Recent Labs  Lab 03/20/17 0659  WBC 12.9*  HGB 11.1*  HCT 33.6*  PLT 284   ------------------------------------------------------------------------------------------------------------------  Chemistries  Recent Labs  Lab 03/19/17 0452 03/20/17 0659  NA 137 137  K 4.6 4.3  CL 107 104  CO2 21* 24  GLUCOSE 182* 96  BUN 31* 30*  CREATININE 1.03 0.92  CALCIUM 8.4* 8.4*  AST 102*  --   ALT 87*  --   ALKPHOS 128*  --   BILITOT 0.6  --    ------------------------------------------------------------------------------------------------------------------  Cardiac Enzymes Recent Labs  Lab 03/19/17 0452  TROPONINI 0.03*   ------------------------------------------------------------------------------------------------------------------  RADIOLOGY:  No results found.  EKG:   Orders placed or performed during the hospital encounter of 03/19/17  . EKG 12-Lead  . EKG 12-Lead  . ED EKG  . ED EKG    ASSESSMENT AND PLAN:   1.  Acute respiratory failure with hypoxia on admission secondary to COPD exacerbation, acute on chronic systolic heart failure.  No improvement, follow sputum cultures.  Continue empiric antibiotics.  Patient already on bronchodilators.  May need to go home with home health, we will add incentive spirometry.  [name]  History of CAD, CABG: Continue routine wound care precautions, seen by wound care nurse, daily dressing changes are recommended.:  Continue aspirin, apixaban, statins, metoprolol, amiodarone.    3.  Anxiety: Continue Xanax.  #4 .history of colon surgery, has colostomy, states that her colostomy bag that he has here is not good unable to get the gas out of  the bag.  Discussed with the nurse to see if we can have a different colostomy bag with the clip to close and open for removal of air.    All the records are reviewed  and case discussed with Care Management/Social Workerr. Management plans discussed with the patient, family and they are in agreement.  CODE STATUS: full  TOTAL TIME TAKING CARE OF THIS PATIENT: 35 minutes.   POSSIBLE D/C IN 1-2DAYS, DEPENDING ON CLINICAL CONDITION.   Epifanio Lesches M.D on 03/21/2017 at 12:38 PM  Between 7am to 6pm - Pager - [phone]  After 6pm go to [email or web address] - password [email or web address] [place] [facility]  Office  [phone]  CC: Primary care physician; Sofie Hartigan, MD   Note: This dictation was prepared with Dragon dictation along with smaller phrase technology. Any transcriptional errors that result from this process are unintentional.

## 2017-03-21 NOTE — Progress Notes (Signed)
Patient alert and oriented. Educated on bed alarm, despite education patient refusing it. Will call for help, ambulated to the bathroom with steady gait. No oxygen or IV tubing. Will continue to monitor patient.

## 2017-03-21 NOTE — Progress Notes (Signed)
Nutrition Education Note  RD consulted for nutrition education regarding COPD/CHF exacerbation.  RD provided "Low Sodium Nutrition Therapy" handout from the Academy of Nutrition and Dietetics. Reviewed patient's dietary recall. Provided examples on ways to decrease sodium intake in diet. Discouraged intake of processed foods and use of salt shaker. Encouraged fresh fruits and vegetables as well as whole grain sources of carbohydrates to maximize fiber intake.   Pt cooks all meals at home for himself and wife. States he tries to avoid carbohydrates and does not add salt. The Nutrition Facts label was reviewed with pt as well as hidden sources of sodium. He stated that he was surprised about the sodium of his condiments and canned foods, but was interested in reducing sodium since he is having so much trouble breathing.  Dietary intake recall:  B: Eggs, tomatoes, pancake with sugar-free syrup, coffee with creamer OR Raisin bran cereal with fresh peach/pear, milk L: Ritz crackers with cheese, ham, [place], or chicken salad (canned chicken with condiments)  D: chicken/pork/fish (broiled or fried) with cabbage, macaroni/cheese, greenbean, or fresh whole potatoes Beverages: 1-2 pepsi (10 oz), 3-4 (10 oz) containers water, occasionally tea and miller lite  Also discussed importance of getting antioxidant vitamins to promote healing of wounds and reduce inflammation and worsening of CHF. Discussed sources of anti-oxidant vitamins (fruits/vegetables, one-a-day MVI) Pt was very interested in improving health. Reports that he has had many recurrent infections in the past and wants to boost his immune system.  RD discussed why it is important for patient to adhere to diet recommendations, and emphasized the role of fluids, foods to avoid, and foods to add. Teach back method used.  Pt said he really enjoyed the conversation and was going to read labels for sodium, add more fruits/vegetables, swap whole grains  for processed, and add a MVI for antioxidants.  Expect good compliance.  Body mass index is 31.03 kg/m. Pt meets criteria for obesity I based on current BMI.  Current diet order is Heart Healthy Low Sodium Patient is consuming approximately 100% of meals at this time and is still hungry.  Pt said he would like to get double protein on meal trays. Pt states that he does not like protein drinks and said he was not interested in supplemental protein while in the hospital.   Labs and medications reviewed. Pt has a good appetite and no significant body weight changes noted. No further nutrition interventions warranted at this time. RD contact information provided. If additional nutrition issues arise, please re-consult RD.   Edmonia Lynch, MS Dietetic Intern Pager: [phone]

## 2017-03-22 ENCOUNTER — Ambulatory Visit: Payer: Medicare Other | Admitting: Family

## 2017-03-22 DIAGNOSIS — Z955 Presence of coronary angioplasty implant and graft: Secondary | ICD-10-CM | POA: Diagnosis not present

## 2017-03-22 DIAGNOSIS — J9620 Acute and chronic respiratory failure, unspecified whether with hypoxia or hypercapnia: Secondary | ICD-10-CM | POA: Diagnosis present

## 2017-03-22 DIAGNOSIS — Z8546 Personal history of malignant neoplasm of prostate: Secondary | ICD-10-CM | POA: Diagnosis not present

## 2017-03-22 DIAGNOSIS — Z9049 Acquired absence of other specified parts of digestive tract: Secondary | ICD-10-CM | POA: Diagnosis not present

## 2017-03-22 DIAGNOSIS — Z7901 Long term (current) use of anticoagulants: Secondary | ICD-10-CM | POA: Diagnosis not present

## 2017-03-22 DIAGNOSIS — I959 Hypotension, unspecified: Secondary | ICD-10-CM | POA: Diagnosis not present

## 2017-03-22 DIAGNOSIS — Z951 Presence of aortocoronary bypass graft: Secondary | ICD-10-CM | POA: Diagnosis not present

## 2017-03-22 DIAGNOSIS — Z96651 Presence of right artificial knee joint: Secondary | ICD-10-CM | POA: Diagnosis present

## 2017-03-22 DIAGNOSIS — Z8349 Family history of other endocrine, nutritional and metabolic diseases: Secondary | ICD-10-CM | POA: Diagnosis not present

## 2017-03-22 DIAGNOSIS — Z933 Colostomy status: Secondary | ICD-10-CM | POA: Diagnosis not present

## 2017-03-22 DIAGNOSIS — Z87891 Personal history of nicotine dependence: Secondary | ICD-10-CM | POA: Diagnosis not present

## 2017-03-22 DIAGNOSIS — Z7982 Long term (current) use of aspirin: Secondary | ICD-10-CM | POA: Diagnosis not present

## 2017-03-22 DIAGNOSIS — E785 Hyperlipidemia, unspecified: Secondary | ICD-10-CM | POA: Diagnosis present

## 2017-03-22 DIAGNOSIS — Z950 Presence of cardiac pacemaker: Secondary | ICD-10-CM | POA: Diagnosis not present

## 2017-03-22 DIAGNOSIS — J9601 Acute respiratory failure with hypoxia: Secondary | ICD-10-CM | POA: Diagnosis present

## 2017-03-22 DIAGNOSIS — Z8249 Family history of ischemic heart disease and other diseases of the circulatory system: Secondary | ICD-10-CM | POA: Diagnosis not present

## 2017-03-22 DIAGNOSIS — I110 Hypertensive heart disease with heart failure: Secondary | ICD-10-CM | POA: Diagnosis present

## 2017-03-22 DIAGNOSIS — I252 Old myocardial infarction: Secondary | ICD-10-CM | POA: Diagnosis not present

## 2017-03-22 DIAGNOSIS — B965 Pseudomonas (aeruginosa) (mallei) (pseudomallei) as the cause of diseases classified elsewhere: Secondary | ICD-10-CM | POA: Diagnosis present

## 2017-03-22 DIAGNOSIS — I5023 Acute on chronic systolic (congestive) heart failure: Secondary | ICD-10-CM | POA: Diagnosis present

## 2017-03-22 DIAGNOSIS — Z809 Family history of malignant neoplasm, unspecified: Secondary | ICD-10-CM | POA: Diagnosis not present

## 2017-03-22 DIAGNOSIS — R0602 Shortness of breath: Secondary | ICD-10-CM | POA: Diagnosis present

## 2017-03-22 DIAGNOSIS — J441 Chronic obstructive pulmonary disease with (acute) exacerbation: Secondary | ICD-10-CM | POA: Diagnosis present

## 2017-03-22 DIAGNOSIS — F419 Anxiety disorder, unspecified: Secondary | ICD-10-CM | POA: Diagnosis present

## 2017-03-22 DIAGNOSIS — Z85048 Personal history of other malignant neoplasm of rectum, rectosigmoid junction, and anus: Secondary | ICD-10-CM | POA: Diagnosis not present

## 2017-03-22 DIAGNOSIS — I2510 Atherosclerotic heart disease of native coronary artery without angina pectoris: Secondary | ICD-10-CM | POA: Diagnosis present

## 2017-03-22 LAB — URINALYSIS, COMPLETE (UACMP) WITH MICROSCOPIC
BILIRUBIN URINE: NEGATIVE
Bacteria, UA: NONE SEEN
Glucose, UA: NEGATIVE mg/dL
Ketones, ur: NEGATIVE mg/dL
Leukocytes, UA: NEGATIVE
Nitrite: NEGATIVE
PH: 6 (ref 5.0–8.0)
Protein, ur: NEGATIVE mg/dL
SPECIFIC GRAVITY, URINE: 1.014 (ref 1.005–1.030)

## 2017-03-22 LAB — CULTURE, RESPIRATORY W GRAM STAIN

## 2017-03-22 LAB — GLUCOSE, CAPILLARY: GLUCOSE-CAPILLARY: 95 mg/dL (ref 65–99)

## 2017-03-22 LAB — CULTURE, RESPIRATORY

## 2017-03-22 MED ORDER — IPRATROPIUM-ALBUTEROL 0.5-2.5 (3) MG/3ML IN SOLN
RESPIRATORY_TRACT | Status: AC
  Filled 2017-03-22: qty 3

## 2017-03-22 MED ORDER — PIPERACILLIN-TAZOBACTAM 3.375 G IVPB
3.3750 g | Freq: Three times a day (TID) | INTRAVENOUS | Status: DC
  Administered 2017-03-22 – 2017-03-23 (×3): 3.375 g via INTRAVENOUS
  Filled 2017-03-22 (×3): qty 50

## 2017-03-22 NOTE — Progress Notes (Signed)
[place] at [place] NAME: Donald Juarez    MR#:  [id number]  DATE OF BIRTH:  [birth year]  SUBJECTIVE:  Sputum Culture showed Pseudomonas..  Patient says he feels little better.  Back pain, possible UTI.,  CHIEF COMPLAINT:   Chief Complaint  Patient presents with  . Shortness of Breath    REVIEW OF SYSTEMS:   ROS CONSTITUTIONAL: No fever, fatigue or weakness.  EYES: No blurred or double vision.  EARS, NOSE, AND THROAT: No tinnitus or ear pain.  RESPIRATORY:  less cough, shortness of breath.  CARDIOVASCULAR: No chest pain, orthopnea, edema.  GASTROINTESTINAL: No nausea, vomiting, diarrhea or abdominal pain.  GENITOURINARY: No dysuria, hematuria.  ENDOCRINE: No polyuria, nocturia,  HEMATOLOGY: No anemia, easy bruising or bleeding SKIN: No rash or lesion. MUSCULOSKELETAL: No joint pain or arthritis.   NEUROLOGIC: No tingling, numbness, weakness.  PSYCHIATRY: No anxiety or depression.   DRUG ALLERGIES:  No Known Allergies  VITALS:  Blood pressure 113/78, pulse 78, temperature 97.8 F (36.6 C), temperature source Oral, resp. rate 17, height 5\' 9"  (1.753 m), weight 94.9 kg (209 lb 3.5 oz), SpO2 95 %.  PHYSICAL EXAMINATION:  GENERAL:  72 y.o.-year-old patient lying in the bed with no acute distress.  EYES: Pupils equal, round, reactive to light and accommodation. No scleral icterus. Extraocular muscles intact.  HEENT: Head atraumatic, normocephalic. Oropharynx and nasopharynx clear.  NECK:  Supple, no jugular venous distention. No thyroid enlargement, no tenderness.  LUNGS: faint expiratory wheeze bilaterally.[name]  CARDIOVASCULAR: S1, S2 normal. No murmurs, rubs, or gallops.  ABDOMEN: Soft, nontender, nondistended. Bowel sounds present. No organomegaly or mass.  EXTREMITIES: No pedal edema, cyanosis, or clubbing.  NEUROLOGIC: Cranial nerves II through XII are intact. Muscle strength 5/5 in all extremities. Sensation intact. Gait not  checked.  PSYCHIATRIC: The patient is alert and oriented x 3.  SKIN: No obvious rash, lesion, or ulcer.    LABORATORY PANEL:   CBC Recent Labs  Lab 03/20/17 0659  WBC 12.9*  HGB 11.1*  HCT 33.6*  PLT 284   ------------------------------------------------------------------------------------------------------------------  Chemistries  Recent Labs  Lab 03/19/17 0452 03/20/17 0659  NA 137 137  K 4.6 4.3  CL 107 104  CO2 21* 24  GLUCOSE 182* 96  BUN 31* 30*  CREATININE 1.03 0.92  CALCIUM 8.4* 8.4*  AST 102*  --   ALT 87*  --   ALKPHOS 128*  --   BILITOT 0.6  --    ------------------------------------------------------------------------------------------------------------------  Cardiac Enzymes Recent Labs  Lab 03/19/17 0452  TROPONINI 0.03*   ------------------------------------------------------------------------------------------------------------------  RADIOLOGY:  No results found.  EKG:   Orders placed or performed during the hospital encounter of 03/19/17  . EKG 12-Lead  . EKG 12-Lead  . ED EKG  . ED EKG    ASSESSMENT AND PLAN:   1.  Acute respiratory failure with hypoxia on admission secondary to COPD exacerbation, acute on chronic systolic heart failure.  No improvement,f sputum showing Pseudomonas.  Change with antibiotics of Zosyn.  He feels little better today.  Await for culture and sensitivity results before discharging the patient..  Not getting oxygen.  Working with physical therapy also.  O2 sats remained more than 90% with ambulation as well  On room air.  [name]  History of CAD, CABG: Continue routine wound care precautions, seen by wound care nurse, daily dressing changes are recommended.:  Continue aspirin, apixaban, statins, metoprolol, amiodarone.    3.  Anxiety: Continue Xanax.,  Paxil.  #4 .history of colon surgery, status post colostomy.   All the records are reviewed and case discussed with Care Management/Social  Workerr. Management plans discussed with the patient, family and they are in agreement.  CODE STATUS: full  TOTAL TIME TAKING CARE OF THIS PATIENT: 35 minutes.   POSSIBLE D/C IN 1-2DAYS, DEPENDING ON CLINICAL CONDITION.   Epifanio Lesches M.D on 03/22/2017 at 1:42 PM  Between 7am to 6pm - Pager - [phone]  After 6pm go to [email or web address] - password [email or web address] [facility]  Office  [phone]  CC: Primary care physician; Sofie Hartigan, MD   Note: This dictation was prepared with Dragon dictation along with smaller phrase technology. Any transcriptional errors that result from this process are unintentional.

## 2017-03-22 NOTE — Progress Notes (Signed)
ANTIBIOTIC CONSULT NOTE - INITIAL  Pharmacy Consult for Zosyn Indication: pneumonia  No Known Allergies  Patient Measurements: Height: 5\' 9"  (175.3 cm) Weight: 209 lb 3.5 oz (94.9 kg) IBW/kg (Calculated) : 70.7  Vital Signs: Temp: 97.8 F (36.6 C) (02/21 0803) Temp Source: Oral (02/21 0803) BP: 113/78 (02/21 0803) Pulse Rate: 78 (02/21 0803) Intake/Output from previous day: 02/20 0701 - 02/21 0700 In: 960 [P.O.:960] Out: 1700 [Urine:1700] Intake/Output from this shift: No intake/output data recorded.  Labs: Recent Labs    03/20/17 0659  WBC 12.9*  HGB 11.1*  PLT 284  CREATININE 0.92   Estimated Creatinine Clearance: 82.5 mL/min (by C-G formula based on SCr of 0.92 mg/dL). No results for input(s): VANCOTROUGH, VANCOPEAK, VANCORANDOM, GENTTROUGH, GENTPEAK, GENTRANDOM, TOBRATROUGH, TOBRAPEAK, TOBRARND, AMIKACINPEAK, AMIKACINTROU, AMIKACIN in the last 72 hours.   Microbiology: Recent Results (from the past 720 hour(s))  Culture, expectorated sputum-assessment     Status: None   Collection Time: 03/20/17  8:51 AM  Result Value Ref Range Status   Specimen Description EXPECTORATED SPUTUM  Final   Special Requests NONE  Final   Sputum evaluation   Final    THIS SPECIMEN IS ACCEPTABLE FOR SPUTUM CULTURE Performed at [facility], [address] [name], [place] [address]    Report Status 03/20/2017 FINAL  Final  Culture, respiratory (NON-Expectorated)     Status: None (Preliminary result)   Collection Time: 03/20/17  8:51 AM  Result Value Ref Range Status   Specimen Description   Final    EXPECTORATED SPUTUM Performed at [facility], [address], [place], [place] [address]    Special Requests   Final    NONE Reflexed from [id number] Performed at [facility], [address] [place], [place] [address]    Gram Stain   Final    FEW WBC PRESENT, PREDOMINANTLY PMN MODERATE GRAM NEGATIVE RODS RARE GRAM POSITIVE COCCI Performed at [facility], [place] [address] [place], [place] [address]    Culture ABUNDANT PSEUDOMONAS AERUGINOSA  Final   Report Status PENDING  Incomplete    Medical History: Past Medical History:  Diagnosis Date  . Anginal pain ([place])    feels this as jaw pain.  takes imdur for this. rarely uses nitro.  [name] Anxiety   . Arthritis   . Asthma   . 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 Carotid stenosis    a. 07/2013 Carotid U/S: 100% RICA (pt prev reported h/o 45%), LICA 80-99%, bilat ECA 50%, normal vertebrals and subclavians bilat-->F/U needed in 01/2014.  . Cataract of left eye   . Cataract, right    a. 10/2010 s/p cataract surgery   . COPD (chronic obstructive pulmonary disease) ([place])   . Coronary artery disease    a. 06/1996 & 03/2005 Cath: non-obs dzs;  b. 07/2009 NSTEMI/Cath: LCX 99->PCI/DES extending into OM1;  c. 01/2013 Cath: patent LCX stent-->Med Rx.; d. cath 03/2014: RPAV dzs->Med rx; e. 10/2014 Cath/PCI: LM 30, LAD 20p/m, LCX 50ost/p, 10 ISR, 60d, OM1 min irregs, OM2 50, RCA 30ost, 53m, 60d, RPDA 70(2.5x8 Xience DES), RPAV 90small(PTCA); f. 10/2014 Relook Cath: RPAV 99(Med Rx), EF 45-50.  [name] Dysrhythmia    since ablation, flutter resolved.  . Essential hypertension   . Hyperkalemia   . Hyperlipidemia   . Myocardial infarction ([place]) 2015   had stent inserted and then had heart attack after. stent inserted due to blockage  . Paroxysmal atrial flutter ([place])    a.  CHA2DS2VASc = 3 -> eliquis;  b. 07/2013 Echo: EF 50-55%, mildly dil LA.c. s/p RFCA 10/31/2013 by Dr Caryl Comes  . Prostate cancer ([place])   . Pulmonary nodule, right    a. 0.7cm RLL - stable by CT 06/2013.  [name] Rectal cancer ([place])    a. Status post colostomy  . Seizures ([place])    a. last 30 years ago  . Shingles    a. 09/2012.  . SSS (sick sinus syndrome) ([place])    a. 12/2009 S/P MDT Adapta ADDR01 DC PPM, ser # [id number] ([name]).  . Syncope and collapse    Assessment: 72 y/o M with a h/o CHF and CAD recently hospitalized for CABG  02/13/17 at [facility] admitted with shortness of breath due to AECOPD. Patient is currently receiving Augmentin and azithromycin. Sputum culture resulted with abundant Pseudomonas.   Plan:  Plans is to transition patient to Zosyn until sensitivities are back for Pseudomonas. Will begin Zosyn 3.375 g EI every 8 hours.   Ulice Dash D 03/22/2017,8:57 AM

## 2017-03-22 NOTE — Progress Notes (Addendum)
Physical Therapy Evaluation Patient Details Name: Donald Juarez MRN: [id number] DOB: [birth year] Today's Date: 03/22/2017   History of Present Illness  Donald Juarez  is a 72 y.o. male with a known history of essential hypertension, CABG at [facility] 3 weeks ago (per patient report), came in because of shortness of breath. Patient woke up with shortness of breath so called ambulance.  Patient received BiPAP for 2 hours in the emergency room because O2 sat 70% by EMS.  Initially he was on nonrebreather in route.  He had labored breathing and wheezing put on BiPAP by ER physician.  Received BiPAP for 2 hours after that he felt better.  Patient is ready to go home but that he did have some wheezing leading 2 L of oxygen.  He is admitted under observation for overnight for COPD exacerbation and acute on chronic CHF.  No chest pain.  No pedal edema.   Clinical Impression  Pt admitted with above diagnosis. Pt currently with functional limitations due to the deficits listed below (see PT Problem List).  Pt is independent with transfers, and ambulation. He is able to ambulate a full lap around RN station without an assistive device. Able to perform spontaneous head turns without LOB. Denies DOE throughout entire distance however respirations appears slightly labored. HR increases to 105-115 with slow ambulation. SaO2 drops to 94% on room air. Pt mildly agitated/irritable throughout session. At end of session pt asks therapists to repeat himself so therapist proceeds to speak in a louder voice. Pt becomes immediately upset and states, "don't yell at me, get out of my room." Pt will not allow therapist to explain that he was just trying to speak in a louder voice to make it easier for patient to hear him. Pt continues to demand that therapist leave room and then threatens physical aggression toward therapist.  Therapist exits room and reports incident to charge nurse (unit nursing director not in office). Verbal exchange  witnessed by Roanna Epley RN who is sitting outside of room. Pt has no further PT needs at this time. He would benefit from cardiac/pulmonary rehab pending referral from MD. Will complete order. Please enter new order if status or needs change.   SaO2 on room air at rest = 98% SaO2 on room air while ambulating = 94%      Follow Up Recommendations Other (comment)(Cardiac/pulmonary rehab)    Equipment Recommendations  None recommended by PT    Recommendations for Other Services       Precautions / Restrictions Precautions Precautions: Fall      Mobility  Bed Mobility               General bed mobility comments: Received and left upright in recliner  Transfers Overall transfer level: Independent Equipment used: None             General transfer comment: Good speed/sequencing. WNL  Ambulation/Gait Ambulation/Gait assistance: Independent Ambulation Distance (Feet): 350 Feet Assistive device: None   Gait velocity: Decreased but functional for limited community mobility   General Gait Details: Pt is able to ambulate a full lap around RN station. Able to perform spontaneous head turns without LOB. Denies DOE throughout entire distance however respirations appears lightly labored. HR increases to 105-115 with slow ambulation. SaO2 drops to 94% on room air.   Stairs            Wheelchair Mobility    Modified Rankin (Stroke Patients Only)       Balance Overall  balance assessment: Needs assistance Sitting-balance support: No upper extremity supported Sitting balance-Leahy Scale: Normal     Standing balance support: No upper extremity supported Standing balance-Leahy Scale: Good Standing balance comment: good balance with feet apart/together. Negative Rhomberg                             Pertinent Vitals/Pain Pain Assessment: No/denies pain    Home Living Family/patient expects to be discharged to:: Private residence Living  Arrangements: Spouse/significant other(Wife currently in nursing home) Available Help at Discharge: Family Type of Home: House Home Access: Level entry     Home Layout: Able to live on main level with bedroom/bathroom;Multi-level Home Equipment: Walker - 2 wheels;Cane - single point;Bedside commode;Wheelchair - manual;Shower seat;Hospital bed      Prior Function Level of Independence: Independent         Comments: Indepedent with ADLs/IADLs     Hand Dominance   Dominant Hand: Right    Extremity/Trunk Assessment   Upper Extremity Assessment Upper Extremity Assessment: Overall WFL for tasks assessed    Lower Extremity Assessment Lower Extremity Assessment: Overall WFL for tasks assessed       Communication   Communication: No difficulties  Cognition Arousal/Alertness: Awake/alert Behavior During Therapy: Agitated;Restless Overall Cognitive Status: No family/caregiver present to determine baseline cognitive functioning                                 General Comments: Pt sincerely states that he doesn't know what city or hospital he is in. Otherwise he appears completely alert and oriented      General Comments      Exercises     Assessment/Plan    PT Assessment Patent does not need any further PT services  PT Problem List         PT Treatment Interventions      PT Goals (Current goals can be found in the Care Plan section)  Acute Rehab PT Goals PT Goal Formulation: All assessment and education complete, DC therapy    Frequency     Barriers to discharge        Co-evaluation               AM-PAC PT "6 Clicks" Daily Activity  Outcome Measure Difficulty turning over in bed (including adjusting bedclothes, sheets and blankets)?: None Difficulty moving from lying on back to sitting on the side of the bed? : None Difficulty sitting down on and standing up from a chair with arms (e.g., wheelchair, bedside commode, etc,.)?:  None Help needed moving to and from a bed to chair (including a wheelchair)?: None Help needed walking in hospital room?: None Help needed climbing 3-5 steps with a railing? : None 6 Click Score: 24    End of Session Equipment Utilized During Treatment: Gait belt Activity Tolerance: Other (comment)(Pt agitated at end of session) Patient left: in chair;with call bell/phone within reach Nurse Communication: Mobility status;Other (comment)(Pt verbally agressive to therapist) PT Visit Diagnosis: Difficulty in walking, not elsewhere classified (R26.2)    Time: 8144-8185 PT Time Calculation (min) (ACUTE ONLY): 20 min   Charges:   PT Evaluation $PT Eval Low Complexity: 1 Low     PT G Codes:        Donald Juarez PT, DPT    Donald Juarez 03/22/2017, 10:01 AM

## 2017-03-22 NOTE — Care Management (Signed)
Sputum cultures positive for pseudomonas. IV antibiotic- Zosyn. Notified [facility] as patient still under observation status.

## 2017-03-23 LAB — GLUCOSE, CAPILLARY: Glucose-Capillary: 115 mg/dL — ABNORMAL HIGH (ref 65–99)

## 2017-03-23 MED ORDER — CIPROFLOXACIN HCL 500 MG PO TABS: 500.0000 mg | ORAL_TABLET | Freq: Two times a day (BID) | ORAL | 0 refills | Status: DC

## 2017-03-23 MED ORDER — METOPROLOL TARTRATE 25 MG PO TABS: 12.5000 mg | ORAL_TABLET | Freq: Two times a day (BID) | ORAL | 2 refills | Status: DC

## 2017-03-23 NOTE — Progress Notes (Signed)
Patient upset that he still has not left after the doctor told him this morning he could go. Tried to explain to the patient about discharge process and that it could be delayed sometimes. Worked on his discharge paperwork in the room and told him what I was doing. Will continue to monitor patient.

## 2017-03-23 NOTE — Care Management (Signed)
Patient to discharge home today.  Notified [facility] and home visit to be made within 24 hours of discharge. CM can not be sure patient's cell phone is operating correctly.  Home health agency to call patient in his hospital room to set up time for initial visit.  Obtained orders for SN PT OT Aide and SW.  Family to transport home

## 2017-03-23 NOTE — Progress Notes (Signed)
Discharge instructions given to patient. IV taken out and tele monitor off. Gave patient "Living better with Heart Failure" packet and went over it with him. Patient verbalized understanding and apologized to me about getting upset earlier. Verbalized "it was not your fault". Patient stated " I am going to wait for my ride down in the lobby". He was adimit about leaving and waiting downstairs. Patient was transported down via wheelchair.

## 2017-03-23 NOTE — Plan of Care (Signed)
  Adequate for Discharge Health Behavior/Discharge Planning: Ability to manage health-related needs will improve 03/23/2017 1417 - Adequate for Discharge by Feliberto Gottron, RN Clinical Measurements: Ability to maintain clinical measurements within normal limits will improve 03/23/2017 1417 - Adequate for Discharge by Chriss Czar, Illene Bolus, RN Will remain free from infection 03/23/2017 1417 - Adequate for Discharge by Feliberto Gottron, RN Diagnostic test results will improve 03/23/2017 1417 - Adequate for Discharge by Feliberto Gottron, RN Respiratory complications will improve 03/23/2017 1417 - Adequate for Discharge by Feliberto Gottron, RN Cardiovascular complication will be avoided 03/23/2017 1417 - Adequate for Discharge by Feliberto Gottron, RN Activity: Risk for activity intolerance will decrease 03/23/2017 1417 - Adequate for Discharge by Feliberto Gottron, RN Nutrition: Adequate nutrition will be maintained 03/23/2017 1417 - Adequate for Discharge by Feliberto Gottron, RN Coping: Level of anxiety will decrease 03/23/2017 1417 - Adequate for Discharge by Feliberto Gottron, RN Pain Managment: General experience of comfort will improve 03/23/2017 1417 - Adequate for Discharge by Feliberto Gottron, RN Safety: Ability to remain free from injury will improve 03/23/2017 1417 - Adequate for Discharge by Feliberto Gottron, RN Skin Integrity: Risk for impaired skin integrity will decrease 03/23/2017 1417 - Adequate for Discharge by Feliberto Gottron, RN

## 2017-03-23 NOTE — Care Management Important Message (Signed)
Important Message  Patient Details  Name: Donald Juarez MRN: [id number] Date of Birth: [birth year]   Medicare Important Message Given:  Yes Signed IM notice given    Katrina Stack, RN 03/23/2017, 7:50 AM

## 2017-03-23 NOTE — Discharge Summary (Signed)
Donald Juarez, is a 72 y.o. male  DOB 1945-02-26  MRN [id number].  Admission date:  03/19/2017  Admitting Physician  Epifanio Lesches, MD  Discharge Date:  03/23/2017   Primary MD  Sofie Hartigan, MD  Recommendations for primary care physician for things to follow:   Follow-up with PCP in 1 week t follow-up with cardiologist at [name] regarding his CABG follow-up.   Admission Diagnosis  Shortness of breath [R06.02] COPD exacerbation (HCC) [J44.1] Acute on chronic congestive heart failure, unspecified heart failure type ([name]) [I50.9]   Discharge Diagnosis  Shortness of breath [R06.02] COPD exacerbation (HCC) [J44.1] Acute on chronic congestive heart failure, unspecified heart failure type ([name]) [I50.9]   Active Problems:   Acute respiratory failure with hypoxia (HCC)   Acute respiratory failure (HCC)      Past Medical History:  Diagnosis Date  . Anginal pain ([name])    feels this as jaw pain.  takes imdur for this. rarely uses nitro.  [name] Anxiety   . Arthritis   . Asthma   . Bilateral claudication of lower limb ([name])   . Carotid stenosis    a. 07/2013 Carotid U/S: 100% RICA (pt prev reported h/o 42%), LICA 87-68%, bilat ECA 50%, normal vertebrals and subclavians bilat-->F/U needed in 01/2014.  . Cataract of left eye   . Cataract, right    a. 10/2010 s/p cataract surgery   . COPD (chronic obstructive pulmonary disease) ([name])   . Coronary artery disease    a. 06/1996 & 03/2005 Cath: non-obs dzs;  b. 07/2009 NSTEMI/Cath: LCX 99->PCI/DES extending into OM1;  c. 01/2013 Cath: patent LCX stent-->Med Rx.; d. cath 03/2014: RPAV dzs->Med rx; e. 10/2014 Cath/PCI: LM 30, LAD 20p/m, LCX 50ost/p, 10 ISR, 60d, OM1 min irregs, OM2 50, RCA 30ost, 39m, 60d, RPDA 70(2.5x8 Xience DES), RPAV 90small(PTCA); f. 10/2014 Relook Cath: RPAV  99(Med Rx), EF 45-50.  [name] Dysrhythmia    since ablation, flutter resolved.  . Essential hypertension   . Hyperkalemia   . Hyperlipidemia   . Myocardial infarction ([name]) 2015   had stent inserted and then had heart attack after. stent inserted due to blockage  . Paroxysmal atrial flutter ([name])    a. CHA2DS2VASc = 3 -> eliquis;  b. 07/2013 Echo: EF 50-55%, mildly dil LA.c. s/p RFCA 10/31/2013 by Dr Caryl Comes  . Prostate cancer ([place])   . Pulmonary nodule, right    a. 0.7cm RLL - stable by CT 06/2013.  [name] Rectal cancer ([name])    a. Status post colostomy  . Seizures ([name])    a. last 30 years ago  . Shingles    a. 09/2012.  . SSS (sick sinus syndrome) ([name])    a. 12/2009 S/P MDT [place] ADDR01 DC PPM, ser # [id number] ([name]).  . Syncope and collapse     Past Surgical History:  Procedure Laterality Date  . ABLATION  10/31/2013   CTI ablation by Dr Caryl Comes for atrial flutter  . ATRIAL FLUTTER ABLATION N/A 10/31/2013   Procedure: ATRIAL FLUTTER ABLATION;  Surgeon: [name], MD;  Location: [facility] CATH LAB;  Service: Cardiovascular;  Laterality: N/A;  . CARDIAC CATHETERIZATION  07/[facility]   [facility]  . CARDIAC CATHETERIZATION  01/2013   [facility]  . CARDIAC CATHETERIZATION  03/2014   [facility]  . CARDIAC CATHETERIZATION N/A 11/02/2014   Procedure: Left Heart Cath and Cors/Grafts Angiography;  Surgeon: [name], MD;  Location: [place] CV LAB;  Service: Cardiovascular;  Laterality: N/A;  .  CARDIAC CATHETERIZATION N/A 11/02/2014   Procedure: Coronary Stent Intervention;  Surgeon: [name], MD;  Location: [place] CV LAB;  Service: Cardiovascular;  Laterality: N/A;  . CARDIAC CATHETERIZATION N/A 11/03/2014   Procedure: Left Heart Cath and Coronary Angiography;  Surgeon: [name], MD;  Location: [place] CV LAB;  Service: Cardiovascular;  Laterality: N/A;  . CARDIAC SURGERY  2011   Stents placed   . CATARACT EXTRACTION Left   . CATARACT EXTRACTION W/PHACO Left 06/01/2014    Procedure: CATARACT EXTRACTION PHACO AND INTRAOCULAR LENS PLACEMENT (IOC);  Surgeon: [name], MD;  Location: [name] ORS;  Service: Ophthalmology;  Laterality: Left;  . cataract surgery  2012  . colon surgery     cancer removal  . COLON SURGERY     colostomy  . COLONOSCOPY WITH PROPOFOL N/A 05/24/2016   Procedure: COLONOSCOPY WITH PROPOFOL;  Surgeon: [name], MD;  Location: [facility] ENDOSCOPY;  Service: Endoscopy;  Laterality: N/A;  . CORONARY ANGIOPLASTY    . INSERT / REPLACE / REMOVE PACEMAKER  2011   just a pacer  . KNEE ARTHROSCOPY Right 1980's  . LEFT HEART CATH AND CORONARY ANGIOGRAPHY N/A 02/08/2017   Procedure: LEFT HEART CATH AND CORONARY ANGIOGRAPHY;  Surgeon: [name], MD;  Location: [place] CV LAB;  Service: Cardiovascular;  Laterality: N/A;  . PACEMAKER PLACEMENT  01/17/2010  . TOTAL KNEE ARTHROPLASTY Right 01/14/2016   Procedure: TOTAL KNEE ARTHROPLASTY;  Surgeon: [name], MD;  Location: [name] ORS;  Service: Orthopedics;  Laterality: Right;       History of present illness and  Hospital Course:     Kindly see H&P for history of present illness and admission details, please review complete Labs, Consult reports and Test reports for all details in brief  HPI  from the history and physical done on the day of admission 72 year old male patient with history of recent bypass surgery came in because of worsening shortness of breath.  Admitted for acute respiratory failure due to COPD exacerbation, CHF exacerbation.  Hospital Course  #1 acute respiratory failure with hypoxia secondary to COPD exacerbation: Received IV steroids, bronchodilators, patient had a yellow and green phlegm, sputum culture showed Pseudomonas.  Received Zosyn, we are discharging him home with Cipro 500 mg twice daily for 5 days.  Patient can continue his bronchodilators, will give prescription for tapering course of steroids.  Patient did not qualify for home oxygen. 2.   Acute on chronic systolic heart failure.  Received Lasix 20 mg IV twice daily, patient needs to follow-up with cardiologist at [facility] who did CABG.  According to the note patient was taken off the Lasix recently.  But he had he came with leg swelling, shortness of breath, orthopnea.  Chest x-ray also showed mild CHF.  Patient needs to take Lasix every day at low-dose at 20 mg daily, advised patient to follow-up with cardiologist.  EF 35-40% by echo done recently.  His recent CABG patient is on aspirin, statins, include amiodarone, Eliquis, metoprolol,  patient had "of hypotension, heart rate also around 59-60.  So we are decreasing metoprolol to 12.5 mg p.o. twice daily.  Also.  He can continue Imdur. 3.  Anxiety: Continue Xanax, Paxil. 4.  History of colon surgery patient had colostomy bag. 5.  Wound care after bypass: Left groin wound is clean, dressings present with iodoform packing.  Patient understood the need to pack and also how to do this as per wound care nurse note.  Patient  advised to continue to keep the wound clean.     Discharge Condition: Stable   Follow UP  [place] [place] Follow up.   Specialty:  [place] Why:  CALL [phone] IF HAVE NOT HEARD FROM AGENCY.   Contact information: [place] [address] [place] Follow up.   Why:  THIS DRUG STORE WILL DELIVER YOU MEDS TO YOUR HOME.  [facility] DOES NOT DELIVER TO [place] Contact information: [phone] [place] Follow up on 03/27/2017.   Specialty:  Cardiology Why:  at 12:20pm Contact information: [place] [phone]            Discharge Instructions  and  Discharge Medications      Allergies as of 03/23/2017   No Known Allergies     Medication List    TAKE these  medications   albuterol 108 (90 Base) MCG/ACT inhaler Commonly known as:  PROAIR HFA Inhale 2 puffs into the lungs every 4 (four) hours as needed for wheezing or shortness of breath.   albuterol (2.5 MG/3ML) 0.083% nebulizer solution Commonly known as:  PROVENTIL Take 3 mLs (2.5 mg total) by nebulization every 6 (six) hours as needed for wheezing or shortness of breath. DX:J44.9   amiodarone 200 MG tablet Commonly known as:  PACERONE Take 200 mg by mouth daily.   arformoterol 15 MCG/2ML Nebu Commonly known as:  BROVANA Take 2 mLs (15 mcg total) by nebulization 2 (two) times daily. Take 2 mLs (15 mcg total) by nebulization 2 (two) times daily. DX code 493.20 Every morning and 1800   aspirin EC 81 MG tablet Take 81 mg by mouth daily.   atorvastatin 80 MG tablet Commonly known as:  LIPITOR Take 1 tablet (80 mg total) by mouth daily.   budesonide 0.5 MG/2ML nebulizer solution Commonly known as:  PULMICORT Take 2 mLs (0.5 mg total) by nebulization 2 (two) times daily. DX code 493.20 Every morning and 1800   budesonide-formoterol 160-4.5 MCG/ACT inhaler Commonly known as:  SYMBICORT Inhale 2 puffs into the lungs 2 (two) times daily.   ciprofloxacin 500 MG tablet Commonly known as:  CIPRO Take 1 tablet (500 mg total) by mouth 2 (two) times daily for 3 days.   ELIQUIS 5 MG Tabs tablet Generic drug:  apixaban Take 5 mg by mouth 2 (two) times daily.   furosemide 20 MG tablet Commonly known as:  LASIX Take 1 tablet (20 mg total) by mouth daily.   guaiFENesin 600 MG 12 hr tablet Commonly known as:  MUCINEX Take 1 tablet (600 mg total) by mouth 2 (two) times daily.   isosorbide mononitrate 30 MG 24 hr tablet Commonly known as:  IMDUR Take 1 tablet (30 mg total) by mouth daily.   meclizine 12.5 MG tablet Commonly known as:  ANTIVERT Take 1-2 tablets by mouth 3 (three) times daily as needed.   metoprolol tartrate 25 MG tablet Commonly known as:  LOPRESSOR Take 1 tablet  (25 mg total) by mouth 2 (two) times daily.   nitroGLYCERIN 0.4 MG SL tablet Commonly known as:  NITROSTAT Place 1 tablet (0.4 mg total) under the tongue every 5 (five) minutes as needed for chest pain.   omeprazole 40 MG capsule Commonly  known as:  PRILOSEC Take 40 mg by mouth daily.   PARoxetine 30 MG tablet Commonly known as:  PAXIL Take 30 mg by mouth daily.   predniSONE 10 MG (21) Tbpk tablet Commonly known as:  STERAPRED UNI-PAK 21 TAB 6 tabs PO x 1 day 5 tabs PO x 1 day 4 tabs PO x 1 day 3 tabs PO x 1 day 2 tabs PO x 1 day 1 tab PO x 1 day and stop   Tiotropium Bromide Monohydrate 1.25 MCG/ACT Aers Commonly known as:  SPIRIVA RESPIMAT Inhale 2 puffs into the lungs 2 (two) times daily.   traMADol 50 MG tablet Commonly known as:  ULTRAM Take 1-2 tablets (50-100 mg total) by mouth every 4 (four) hours as needed for moderate pain. What changed:    how much to take  when to take this   Vitamin D 2000 units tablet Take 2,000 Units by mouth daily.         Diet and Activity recommendation: See Discharge Instructions above   Consults obtained -physical therapy, wound care nurse   Major procedures and Radiology Reports - PLEASE review detailed and final reports for all details, in brief -     Dg Chest Portable 1 View  Result Date: 03/19/2017 CLINICAL DATA:  Shortness of breath. EXAM: PORTABLE CHEST 1 VIEW COMPARISON:  Radiographs 03/12/2017 FINDINGS: Left-sided pacemaker in place. Post median sternotomy and CABG. Cardiomegaly is similar to prior exam. Unchanged mediastinal contours with aortic atherosclerosis. Mild peribronchial thickening with some improvement from prior. Mild bibasilar atelectasis, left greater than right. No confluent airspace disease. No large pleural effusion. No pneumothorax. IMPRESSION: Mild peribronchial thickening which may be pulmonary edema or bronchitic change, improved from exam 1 week prior. Unchanged cardiomegaly.  Aortic  atherosclerosis. Electronically Signed   By: Jeb Levering M.D.   On: 03/19/2017 05:29   Dg Chest Port 1 View  Result Date: 03/12/2017 CLINICAL DATA:  Onset of shortness of breath today. History of COPD and recent cardiac surgery. History of prostate and rectal cancer. EXAM: PORTABLE CHEST 1 VIEW COMPARISON:  Radiographs 02/07/2017 and 04/27/2016. FINDINGS: 0939 hr. Two views submitted. Left subclavian pacemaker leads appear unchanged within the right atrium and right ventricle. Interval median sternotomy and CABG. The heart size is stable. There is increased septal thickening at both lung bases suspicious for mild interstitial edema. There is probable mild left lower lobe atelectasis. No significant pleural effusion or pneumothorax. IMPRESSION: Interval CABG with suspected mild pulmonary edema and left lower lobe atelectasis. No pneumothorax or significant pleural effusion. Electronically Signed   By: Richardean Sale M.D.   On: 03/12/2017 10:03    Micro Results    Recent Results (from the past 240 hour(s))  Culture, expectorated sputum-assessment     Status: None   Collection Time: 03/20/17  8:51 AM  Result Value Ref Range Status   Specimen Description EXPECTORATED SPUTUM  Final   Special Requests NONE  Final   Sputum evaluation   Final    THIS SPECIMEN IS ACCEPTABLE FOR SPUTUM CULTURE Performed at [facility], [address] [place], [place] [address]    Report Status 03/20/2017 FINAL  Final  Culture, respiratory (NON-Expectorated)     Status: None   Collection Time: 03/20/17  8:51 AM  Result Value Ref Range Status   Specimen Description   Final    EXPECTORATED SPUTUM Performed at [facility], [address], [place], [place] [address]    Special Requests   Final  NONE Reflexed from [id number] Performed at [facility], [place], [place] [address]    Gram Stain   Final    FEW WBC PRESENT, PREDOMINANTLY PMN MODERATE GRAM NEGATIVE  RODS RARE GRAM POSITIVE COCCI Performed at [facility], [place] [address], [place], [place] [address]    Culture ABUNDANT PSEUDOMONAS AERUGINOSA  Final   Report Status 03/22/2017 FINAL  Final   Organism ID, Bacteria PSEUDOMONAS AERUGINOSA  Final      Susceptibility   Pseudomonas aeruginosa - MIC*    CEFTAZIDIME 4 SENSITIVE Sensitive     CIPROFLOXACIN <=0.25 SENSITIVE Sensitive     GENTAMICIN <=1 SENSITIVE Sensitive     IMIPENEM 2 SENSITIVE Sensitive     PIP/TAZO 8 SENSITIVE Sensitive     CEFEPIME 4 SENSITIVE Sensitive     * ABUNDANT PSEUDOMONAS AERUGINOSA       Today   Subjective:   Donald Juarez today has no headache,no chest abdominal pain,no new weakness tingling or numbness, feels much better wants to go home today.   Objective:   Blood pressure 120/69, pulse (!) 59, temperature 98 F (36.7 C), temperature source Oral, resp. rate 19, height 5\' 9"  (1.753 m), weight 93.8 kg (206 lb 12.8 oz), SpO2 97 %.   Intake/Output Summary (Last 24 hours) at 03/23/2017 0942 Last data filed at 03/22/2017 1350 Gross per 24 hour  Intake 0 ml  Output -  Net 0 ml    Exam Awake Alert, Oriented x 3, No new F.N deficits, Normal affect [place].AT,PERRAL Supple Neck,No JVD, No cervical lymphadenopathy appriciated.  Symmetrical Chest wall movement, Good air movement bilaterally, CTAB RRR,No Gallops,Rubs or new Murmurs, No Parasternal Heave +ve B.Sounds, Abd Soft, Non tender, No organomegaly appriciated, No rebound -guarding or rigidity. No Cyanosis, Clubbing or edema, No new Rash or bruise  Data Review   CBC w Diff:  Lab Results  Component Value Date   WBC 12.9 (H) 03/20/2017   HGB 11.1 (L) 03/20/2017   HGB 14.4 10/26/2014   HCT 33.6 (L) 03/20/2017   HCT 42.6 10/26/2014   PLT 284 03/20/2017   PLT 181 10/26/2014   LYMPHOPCT 5 03/12/2017   LYMPHOPCT 7.7 10/08/2013   MONOPCT 5 03/12/2017   MONOPCT 7.1 10/08/2013   EOSPCT 9 03/12/2017   EOSPCT 1.0 10/08/2013   BASOPCT 0  03/12/2017   BASOPCT 1.0 10/08/2013    CMP:  Lab Results  Component Value Date   NA 137 03/20/2017   NA 141 10/26/2014   NA 137 05/10/2014   K 4.3 03/20/2017   K 3.4 (L) 05/10/2014   CL 104 03/20/2017   CL 104 05/10/2014   CO2 24 03/20/2017   CO2 22 05/10/2014   BUN 30 (H) 03/20/2017   BUN 19 10/26/2014   BUN 20 05/10/2014   CREATININE 0.92 03/20/2017   CREATININE 1.15 05/10/2014   PROT 6.5 03/19/2017   PROT 7.3 05/10/2014   ALBUMIN 3.3 (L) 03/19/2017   ALBUMIN 3.9 05/10/2014   BILITOT 0.6 03/19/2017   BILITOT 0.7 05/10/2014   ALKPHOS 128 (H) 03/19/2017   ALKPHOS 67 05/10/2014   AST 102 (H) 03/19/2017   AST 41 05/10/2014   ALT 87 (H) 03/19/2017   ALT 37 05/10/2014  .   Total Time in preparing paper work, data evaluation and todays exam - 35 minutes  Epifanio Lesches M.D on 03/23/2017 at 9:42 AM    Note: This dictation was prepared with Dragon dictation along with smaller phrase technology. Any transcriptional  errors that result from this process are unintentional.

## 2017-03-25 ENCOUNTER — Emergency Department: Payer: Medicare Other

## 2017-03-25 ENCOUNTER — Encounter: Payer: Self-pay | Admitting: Emergency Medicine

## 2017-03-25 ENCOUNTER — Other Ambulatory Visit: Payer: Self-pay

## 2017-03-25 ENCOUNTER — Inpatient Hospital Stay
Admission: EM | Admit: 2017-03-25 | Discharge: 2017-03-28 | DRG: 190 | Disposition: A | Discharge status: Home Health | Payer: Medicare Other | Attending: Internal Medicine | Admitting: Internal Medicine

## 2017-03-25 DIAGNOSIS — Z87891 Personal history of nicotine dependence: Secondary | ICD-10-CM | POA: Diagnosis not present

## 2017-03-25 DIAGNOSIS — Z96651 Presence of right artificial knee joint: Secondary | ICD-10-CM | POA: Diagnosis present

## 2017-03-25 DIAGNOSIS — I4891 Unspecified atrial fibrillation: Secondary | ICD-10-CM | POA: Diagnosis present

## 2017-03-25 DIAGNOSIS — Z7901 Long term (current) use of anticoagulants: Secondary | ICD-10-CM

## 2017-03-25 DIAGNOSIS — Z7951 Long term (current) use of inhaled steroids: Secondary | ICD-10-CM

## 2017-03-25 DIAGNOSIS — Z79899 Other long term (current) drug therapy: Secondary | ICD-10-CM

## 2017-03-25 DIAGNOSIS — I2510 Atherosclerotic heart disease of native coronary artery without angina pectoris: Secondary | ICD-10-CM | POA: Diagnosis present

## 2017-03-25 DIAGNOSIS — Z951 Presence of aortocoronary bypass graft: Secondary | ICD-10-CM

## 2017-03-25 DIAGNOSIS — Z955 Presence of coronary angioplasty implant and graft: Secondary | ICD-10-CM | POA: Diagnosis not present

## 2017-03-25 DIAGNOSIS — Z85048 Personal history of other malignant neoplasm of rectum, rectosigmoid junction, and anus: Secondary | ICD-10-CM

## 2017-03-25 DIAGNOSIS — J441 Chronic obstructive pulmonary disease with (acute) exacerbation: Secondary | ICD-10-CM | POA: Diagnosis present

## 2017-03-25 DIAGNOSIS — Z7982 Long term (current) use of aspirin: Secondary | ICD-10-CM

## 2017-03-25 DIAGNOSIS — E785 Hyperlipidemia, unspecified: Secondary | ICD-10-CM | POA: Diagnosis present

## 2017-03-25 DIAGNOSIS — R0602 Shortness of breath: Secondary | ICD-10-CM

## 2017-03-25 DIAGNOSIS — R0600 Dyspnea, unspecified: Secondary | ICD-10-CM | POA: Diagnosis not present

## 2017-03-25 DIAGNOSIS — Z950 Presence of cardiac pacemaker: Secondary | ICD-10-CM | POA: Diagnosis not present

## 2017-03-25 DIAGNOSIS — T380X5A Adverse effect of glucocorticoids and synthetic analogues, initial encounter: Secondary | ICD-10-CM | POA: Diagnosis present

## 2017-03-25 DIAGNOSIS — I110 Hypertensive heart disease with heart failure: Secondary | ICD-10-CM | POA: Diagnosis present

## 2017-03-25 DIAGNOSIS — J9601 Acute respiratory failure with hypoxia: Secondary | ICD-10-CM | POA: Diagnosis present

## 2017-03-25 DIAGNOSIS — Z8546 Personal history of malignant neoplasm of prostate: Secondary | ICD-10-CM | POA: Diagnosis not present

## 2017-03-25 DIAGNOSIS — I5023 Acute on chronic systolic (congestive) heart failure: Secondary | ICD-10-CM | POA: Diagnosis present

## 2017-03-25 DIAGNOSIS — R339 Retention of urine, unspecified: Secondary | ICD-10-CM | POA: Diagnosis present

## 2017-03-25 DIAGNOSIS — D72829 Elevated white blood cell count, unspecified: Secondary | ICD-10-CM | POA: Diagnosis present

## 2017-03-25 LAB — BASIC METABOLIC PANEL
Anion gap: 8 (ref 5–15)
BUN: 26 mg/dL — AB (ref 6–20)
CHLORIDE: 98 mmol/L — AB (ref 101–111)
CO2: 28 mmol/L (ref 22–32)
CREATININE: 1.03 mg/dL (ref 0.61–1.24)
Calcium: 8.3 mg/dL — ABNORMAL LOW (ref 8.9–10.3)
Glucose, Bld: 176 mg/dL — ABNORMAL HIGH (ref 65–99)
POTASSIUM: 4.1 mmol/L (ref 3.5–5.1)
SODIUM: 134 mmol/L — AB (ref 135–145)

## 2017-03-25 LAB — BLOOD GAS, VENOUS
Acid-Base Excess: 4 mmol/L — ABNORMAL HIGH (ref 0.0–2.0)
Bicarbonate: 29.7 mmol/L — ABNORMAL HIGH (ref 20.0–28.0)
O2 Saturation: 76.2 %
PH VEN: 7.4 (ref 7.250–7.430)
Patient temperature: 37
pCO2, Ven: 48 mmHg (ref 44.0–60.0)
pO2, Ven: 41 mmHg (ref 32.0–45.0)

## 2017-03-25 LAB — CBC
HEMATOCRIT: 38.7 % — AB (ref 40.0–52.0)
HEMOGLOBIN: 12.9 g/dL — AB (ref 13.0–18.0)
MCH: 30.7 pg (ref 26.0–34.0)
MCHC: 33.3 g/dL (ref 32.0–36.0)
MCV: 92.1 fL (ref 80.0–100.0)
Platelets: 291 10*3/uL (ref 150–440)
RBC: 4.2 MIL/uL — ABNORMAL LOW (ref 4.40–5.90)
RDW: 14.9 % — ABNORMAL HIGH (ref 11.5–14.5)
WBC: 13.1 10*3/uL — AB (ref 3.8–10.6)

## 2017-03-25 LAB — MRSA PCR SCREENING: MRSA BY PCR: NEGATIVE

## 2017-03-25 LAB — TROPONIN I: Troponin I: 0.05 ng/mL (ref <0.03)

## 2017-03-25 MED ORDER — ALBUTEROL SULFATE (2.5 MG/3ML) 0.083% IN NEBU
2.5000 mg | INHALATION_SOLUTION | Freq: Once | RESPIRATORY_TRACT | Status: AC
  Administered 2017-03-25: 2.5 mg via RESPIRATORY_TRACT
  Filled 2017-03-25: qty 3

## 2017-03-25 MED ORDER — ALBUTEROL SULFATE (2.5 MG/3ML) 0.083% IN NEBU: 2.5000 mg | INHALATION_SOLUTION | RESPIRATORY_TRACT | Status: DC | PRN

## 2017-03-25 MED ORDER — IPRATROPIUM-ALBUTEROL 0.5-2.5 (3) MG/3ML IN SOLN
3.0000 mL | Freq: Once | RESPIRATORY_TRACT | Status: AC
  Administered 2017-03-25: 3 mL via RESPIRATORY_TRACT
  Filled 2017-03-25: qty 3

## 2017-03-25 MED ORDER — ENOXAPARIN SODIUM 40 MG/0.4ML ~~LOC~~ SOLN
40.0000 mg | SUBCUTANEOUS | Status: DC
  Administered 2017-03-25 – 2017-03-26 (×2): 40 mg via SUBCUTANEOUS
  Filled 2017-03-25 (×2): qty 0.4

## 2017-03-25 MED ORDER — SODIUM CHLORIDE 0.9% FLUSH
3.0000 mL | Freq: Two times a day (BID) | INTRAVENOUS | Status: DC
  Administered 2017-03-25 – 2017-03-28 (×7): 3 mL via INTRAVENOUS

## 2017-03-25 MED ORDER — ONDANSETRON HCL 4 MG/2ML IJ SOLN: 4.0000 mg | Freq: Four times a day (QID) | INTRAMUSCULAR | Status: DC | PRN

## 2017-03-25 MED ORDER — FUROSEMIDE 10 MG/ML IJ SOLN
INTRAMUSCULAR | Status: AC
Start: 2017-03-25 — End: 2017-03-25
  Administered 2017-03-25: 60 mg via INTRAVENOUS
  Filled 2017-03-25: qty 8

## 2017-03-25 MED ORDER — GUAIFENESIN-DM 100-10 MG/5ML PO SYRP
5.0000 mL | ORAL_SOLUTION | ORAL | Status: DC | PRN
  Filled 2017-03-25: qty 5

## 2017-03-25 MED ORDER — POLYETHYLENE GLYCOL 3350 17 G PO PACK: 17.0000 g | PACK | Freq: Every day | ORAL | Status: DC | PRN

## 2017-03-25 MED ORDER — ONDANSETRON HCL 4 MG PO TABS: 4.0000 mg | ORAL_TABLET | Freq: Four times a day (QID) | ORAL | Status: DC | PRN

## 2017-03-25 MED ORDER — FUROSEMIDE 10 MG/ML IJ SOLN
60.0000 mg | Freq: Once | INTRAMUSCULAR | Status: AC
  Administered 2017-03-25: 60 mg via INTRAVENOUS

## 2017-03-25 MED ORDER — METHYLPREDNISOLONE SODIUM SUCC 125 MG IJ SOLR
60.0000 mg | Freq: Two times a day (BID) | INTRAMUSCULAR | Status: DC
  Administered 2017-03-25 – 2017-03-27 (×6): 60 mg via INTRAVENOUS
  Filled 2017-03-25 (×6): qty 2

## 2017-03-25 MED ORDER — ORAL CARE MOUTH RINSE
15.0000 mL | Freq: Two times a day (BID) | OROMUCOSAL | Status: DC
  Administered 2017-03-25 – 2017-03-28 (×5): 15 mL via OROMUCOSAL

## 2017-03-25 MED ORDER — ACETAMINOPHEN 650 MG RE SUPP: 650.0000 mg | Freq: Four times a day (QID) | RECTAL | Status: DC | PRN

## 2017-03-25 MED ORDER — ACETAMINOPHEN 325 MG PO TABS
650.0000 mg | ORAL_TABLET | Freq: Four times a day (QID) | ORAL | Status: DC | PRN
  Administered 2017-03-27: 650 mg via ORAL
  Filled 2017-03-25: qty 2

## 2017-03-25 MED ORDER — IPRATROPIUM-ALBUTEROL 0.5-2.5 (3) MG/3ML IN SOLN
3.0000 mL | Freq: Four times a day (QID) | RESPIRATORY_TRACT | Status: DC
  Administered 2017-03-25 – 2017-03-26 (×4): 3 mL via RESPIRATORY_TRACT
  Filled 2017-03-25 (×6): qty 3

## 2017-03-25 NOTE — Clinical Social Work Note (Signed)
CSW consulted by nursing for assisted living and in comments says "or home health." Patient has come from home and has not been evaluated by PT. Please re consult CSW if needs arise. Shela Leff MSW,LCSW [phone]

## 2017-03-25 NOTE — ED Notes (Signed)
This RN, Siri Cole (RRT) & Sonia Baller, nursing student, to transport pt to [place].

## 2017-03-25 NOTE — Consult Note (Signed)
Donald Juarez   ASSESSMENT/PLAN   Respiratory distress. Multifactorial etiology. Combination of congestive heart failure. Patient's last ejection fraction was 35-40%. Patient is also bronchospastic at the time. On a prednisone taper. Agree with diuretic therapy, change to Solu-Medrol, albuterol, Atrovent, budesonide, noninvasive ventilation and admitted to the intensive care unit  Positive troponin 0.05. EKG reveals sinus with left bundle branch block and repolarization abnormalities underlying ischemia difficult to interpret in the setting. Compared to 03/19/2017 EKG no significant changes. Patient is presently on amiodarone, anticoagulation, aspirin, statins and beta blocker.  Leukocytosis. May reflect D margin a-and secondary to steroids versus superimposed tracheobronchitis. Would empirically treat with antibiotics   VENTILATOR SETTINGS: FiO2 (%):  [25 %] 25 %   Name: Donald Juarez MRN: [id number] DOB: 1945/12/06    ADMISSION DATE:  03/25/2017 Juarez DATE:  03/25/2017  REFERRING MD :  Dr. Darvin Neighbours  CHIEF COMPLAINT:  Shortness of breath  HISTORY OF PRESENT ILLNESS:  Donald Juarez is a 72 year old gentleman with a past medical history remarkable for coronary artery disease, status post bypass surgery, hypertension, anxiety, carotid stenosis, peripheral vascular disease, COPD, sick sinus syndrome, prostate cancer, colorectal cancer, hyperlipidemia, although today with increasing shortness of breath and called EMS. He states that he has been having some significant cough with thick white sputum production. Was recently discharged from the hospital on prednisone taper and unknown antibiotic. His home inhaler regimen includes Spiriva, Symbicort with rescue short-acting agents. In emergency department he was started on BiPAP. He is agitated at this time states that he needs to be straight cath as he has to go to the bathroom and is in pain. He states that his  breathing is about the same on BiPAP.  PAST MEDICAL HISTORY :  Past Medical History:  Diagnosis Date  . Anginal pain ([place])    feels this as jaw pain.  takes imdur for this. rarely uses nitro.  [name] Anxiety   . Arthritis   . Asthma   . Bilateral claudication of lower limb ([place])   . Carotid stenosis    a. 07/2013 Carotid U/S: 100% RICA (pt prev reported h/o 30%), LICA 16-01%, bilat ECA 50%, normal vertebrals and subclavians bilat-->F/U needed in 01/2014.  . Cataract of left eye   . Cataract, right    a. 10/2010 s/p cataract surgery   . COPD (chronic obstructive pulmonary disease) ([place])   . Coronary artery disease    a. 06/1996 & 03/2005 Cath: non-obs dzs;  b. 07/2009 NSTEMI/Cath: LCX 99->PCI/DES extending into OM1;  c. 01/2013 Cath: patent LCX stent-->Med Rx.; d. cath 03/2014: RPAV dzs->Med rx; e. 10/2014 Cath/PCI: LM 30, LAD 20p/m, LCX 50ost/p, 10 ISR, 60d, OM1 min irregs, OM2 50, RCA 30ost, 9m, 60d, RPDA 70(2.5x8 Xience DES), RPAV 90small(PTCA); f. 10/2014 Relook Cath: RPAV 99(Med Rx), EF 45-50.  [name] Dysrhythmia    since ablation, flutter resolved.  . Essential hypertension   . Hyperkalemia   . Hyperlipidemia   . Myocardial infarction ([place]) 2015   had stent inserted and then had heart attack after. stent inserted due to blockage  . Paroxysmal atrial flutter ([place])    a. CHA2DS2VASc = 3 -> eliquis;  b. 07/2013 Echo: EF 50-55%, mildly dil LA.c. s/p RFCA 10/31/2013 by Dr Caryl Comes  . Prostate cancer ([place])   . Pulmonary nodule, right    a. 0.7cm RLL - stable by CT 06/2013.  [name] Rectal cancer ([place])    a. Status post colostomy  . Seizures ([place])  a. last 30 years ago  . Shingles    a. 09/2012.  . SSS (sick sinus syndrome) ([place])    a. 12/2009 S/P MDT Adapta ADDR01 DC PPM, ser # [id number] ([name]).  . Syncope and collapse    Past Surgical History:  Procedure Laterality Date  . ABLATION  10/31/2013   CTI ablation by Dr Caryl Comes for atrial flutter  . ATRIAL FLUTTER ABLATION N/A 10/31/2013   Procedure:  ATRIAL FLUTTER ABLATION;  Surgeon: [name], MD;  Location[facility] CATH LAB;  Service: Cardiovascular;  Laterality: N/A;  . CARDIAC CATHETERIZATION  07/2009   [facility]  . CARDIAC CATHETERIZATION  01/2013   [facility]  . CARDIAC CATHETERIZATION  03/2014   [facility]  . CARDIAC CATHETERIZATION N/A 11/02/2014   Procedure: Left Heart Cath and Cors/Grafts Angiography;  Surgeon: [name], MD;  Location: [place] CV LAB;  Service: Cardiovascular;  Laterality: N/A;  . CARDIAC CATHETERIZATION N/A 11/02/2014   Procedure: Coronary Stent Intervention;  Surgeon: [name], MD;  Location: [place] CV LAB;  Service: Cardiovascular;  Laterality: N/A;  . CARDIAC CATHETERIZATION N/A 11/03/2014   Procedure: Left Heart Cath and Coronary Angiography;  Surgeon: [name], MD;  Location: [place] CV LAB;  Service: Cardiovascular;  Laterality: N/A;  . CARDIAC SURGERY  2011   Stents placed   . CATARACT EXTRACTION Left   . CATARACT EXTRACTION W/PHACO Left 06/01/2014   Procedure: CATARACT EXTRACTION PHACO AND INTRAOCULAR LENS PLACEMENT (IOC);  Surgeon: [name], MD;  Location: [name] ORS;  Service: Ophthalmology;  Laterality: Left;  . cataract surgery  2012  . colon surgery     cancer removal  . COLON SURGERY     colostomy  . COLONOSCOPY WITH PROPOFOL N/A 05/24/2016   Procedure: COLONOSCOPY WITH PROPOFOL;  Surgeon: [name], MD;  Location: [facility]OSCOPY;  Service: Endoscopy;  Laterality: N/A;  . CORONARY ANGIOPLASTY    . INSERT / REPLACE / REMOVE PACEMAKER  2011   just a pacer  . KNEE ARTHROSCOPY Right 1980's  . LEFT HEART CATH AND CORONARY ANGIOGRAPHY N/A 02/08/2017   Procedure: LEFT HEART CATH AND CORONARY ANGIOGRAPHY;  Surgeon: [name], MD;  Location: [place] CV LAB;  Service: Cardiovascular;  Laterality: N/A;  . PACEMAKER PLACEMENT  01/17/2010  . TOTAL KNEE ARTHROPLASTY Right 01/14/2016   Procedure: TOTAL KNEE ARTHROPLASTY;  Surgeon: [name], MD;   Location: [name] ORS;  Service: Orthopedics;  Laterality: Right;   Prior to Admission medications   Medication Sig Start Date End Date Taking? Authorizing Provider  albuterol (PROAIR HFA) 108 (90 Base) MCG/ACT inhaler Inhale 2 puffs into the lungs every 4 (four) hours as needed for wheezing or shortness of breath. 07/11/16  Yes Flora Lipps, MD  albuterol (PROVENTIL) (2.5 MG/3ML) 0.083% nebulizer solution Take 3 mLs (2.5 mg total) by nebulization every 6 (six) hours as needed for wheezing or shortness of breath. DX:J44.9 10/04/16  Yes Flora Lipps, MD  amiodarone (PACERONE) 200 MG tablet Take 200 mg by mouth daily. 02/26/17 03/28/17 Yes [provider]  apixaban (ELIQUIS) 5 MG TABS tablet Take 5 mg by mouth 2 (two) times daily. 02/26/17  Yes [provider]  arformoterol (BROVANA) 15 MCG/2ML NEBU Take 2 mLs (15 mcg total) by nebulization 2 (two) times daily. Take 2 mLs (15 mcg total) by nebulization 2 (two) times daily. DX code 493.20 Every morning and 1800 07/11/16  Yes Flora Lipps, MD  aspirin EC 81 MG tablet Take 81 mg by  mouth daily.   Yes [provider]  atorvastatin (LIPITOR) 80 MG tablet Take 1 tablet (80 mg total) by mouth daily. 05/09/16 03/12/18 Yes Wende Bushy, MD  budesonide (PULMICORT) 0.5 MG/2ML nebulizer solution Take 2 mLs (0.5 mg total) by nebulization 2 (two) times daily. DX code 493.20 Every morning and 1800 07/11/16  Yes Kasa, Maretta Bees, MD  budesonide-formoterol (SYMBICORT) 160-4.5 MCG/ACT inhaler Inhale 2 puffs into the lungs 2 (two) times daily.   Yes [provider]  Cholecalciferol (VITAMIN D) 2000 UNITS tablet Take 2,000 Units by mouth daily.   Yes [provider]  ciprofloxacin (CIPRO) 500 MG tablet Take 1 tablet (500 mg total) by mouth 2 (two) times daily for 3 days. 03/23/17 03/26/17 Yes Epifanio Lesches, MD  furosemide (LASIX) 20 MG tablet Take 1 tablet (20 mg total) by mouth daily. 03/14/17  Yes Gladstone Lighter, MD  guaiFENesin  (MUCINEX) 600 MG 12 hr tablet Take 1 tablet (600 mg total) by mouth 2 (two) times daily. 03/14/17  Yes Gladstone Lighter, MD  isosorbide mononitrate (IMDUR) 30 MG 24 hr tablet Take 1 tablet (30 mg total) by mouth daily. 03/14/17 03/14/18 Yes Gladstone Lighter, MD  meclizine (ANTIVERT) 12.5 MG tablet Take 1-2 tablets by mouth 3 (three) times daily as needed. 01/05/17  Yes [provider]  metoprolol tartrate (LOPRESSOR) 25 MG tablet Take 0.5 tablets (12.5 mg total) by mouth 2 (two) times daily. 03/23/17  Yes Epifanio Lesches, MD  nitroGLYCERIN (NITROSTAT) 0.4 MG SL tablet Place 1 tablet (0.4 mg total) under the tongue every 5 (five) minutes as needed for chest pain. 05/09/16  Yes Wende Bushy, MD  omeprazole (PRILOSEC) 40 MG capsule Take 40 mg by mouth daily.   Yes [provider]  PARoxetine (PAXIL) 30 MG tablet Take 30 mg by mouth daily.    Yes [provider]  predniSONE (STERAPRED UNI-PAK 21 TAB) 10 MG (21) TBPK tablet 6 tabs PO x 1 day 5 tabs PO x 1 day 4 tabs PO x 1 day 3 tabs PO x 1 day 2 tabs PO x 1 day 1 tab PO x 1 day and stop 03/14/17  Yes Gladstone Lighter, MD  Tiotropium Bromide Monohydrate (SPIRIVA RESPIMAT) 1.25 MCG/ACT AERS Inhale 2 puffs into the lungs 2 (two) times daily. 07/11/16  Yes Flora Lipps, MD  traMADol (ULTRAM) 50 MG tablet Take 1-2 tablets (50-100 mg total) by mouth every 4 (four) hours as needed for moderate pain. Patient taking differently: Take 50 mg by mouth every 6 (six) hours as needed for moderate pain.  01/16/16  Yes Duanne Guess, PA-C   No Known Allergies  FAMILY HISTORY:  Family History  Problem Relation Age of Onset  . Cancer Mother        'male cancer"  . Hypertension Mother   . Hyperlipidemia Mother   . Heart disease Father   . Heart attack Father   . Hypertension Father   . Hyperlipidemia Father    SOCIAL HISTORY:  reports that he quit smoking about 8 years ago. His smoking use included cigarettes. He has a  30.00 pack-year smoking history. he has never used smokeless tobacco. He reports that he does not drink alcohol or use drugs.  REVIEW OF SYSTEMS:   Limited review of systems secondary to respiratory distress. Please see history of present illness  VITAL SIGNS: Temp:  [98 F (36.7 C)] 98 F (36.7 C) (02/24 0558) Pulse Rate:  [57-136] 74 (02/24 1010) Resp:  [24-31] 27 (02/24 1006) BP: (  103-123)/(43-87) 120/73 (02/24 1006) SpO2:  [93 %-97 %] 96 % (02/24 1010) FiO2 (%):  [25 %] 25 % (02/24 0904) Weight:  [94 kg (207 lb 3.2 oz)] 94 kg (207 lb 3.2 oz) (02/24 0559) HEMODYNAMICS:   VENTILATOR SETTINGS: FiO2 (%):  [25 %] 25 % INTAKE / OUTPUT:  Intake/Output Summary (Last 24 hours) at 03/25/2017 1109 Last data filed at 03/25/2017 0945 Gross per 24 hour  Intake -  Output 550 ml  Net -550 ml    Physical Examination:   VS: BP 120/73   Pulse 74   Temp 98 F (36.7 C) (Oral)   Resp (!) 27   Wt 94 kg (207 lb 3.2 oz)   SpO2 96%   BMI 30.60 kg/m   General Appearance: Patient is awake, alert, agitated, on NIV Neuro:without focal findings, mental status, speech normal,. HEENT: PERRLA, EOM intact, no ptosis, no other lesions noticed; trachea is midline, no thyromegaly appreciated, no stridor, accessory muscle utilization noted Pulmonary: Prolonged expiratory phase, diffuse wheezing appreciated and expiratory rhonchi CardiovascularNormal S1,S2. Pacemaker in place, sternotomy incision noted systolic murmur left parasternal border  Abdomen: Colostomy noted, positive bowel sounds, distended and painful lower abdomen, patient states that he needs to void Skin:   warm, no rashes, no ecchymosis  Extremities: normal, no cyanosis, clubbing, no edema, warm with normal capillary refill.    LABS: Reviewed   LABORATORY PANEL:   CBC Recent Labs  Lab 03/25/17 0550  WBC 13.1*  HGB 12.9*  HCT 38.7*  PLT 291    Chemistries  Recent Labs  Lab 03/19/17 0452  03/25/17 0841  NA 137   < > 134*   K 4.6   < > 4.1  CL 107   < > 98*  CO2 21*   < > 28  GLUCOSE 182*   < > 176*  BUN 31*   < > 26*  CREATININE 1.03   < > 1.03  CALCIUM 8.4*   < > 8.3*  AST 102*  --   --   ALT 87*  --   --   ALKPHOS 128*  --   --   BILITOT 0.6  --   --    < > = values in this interval not displayed.    Recent Labs  Lab 03/20/17 0822 03/21/17 0747 03/22/17 0744 03/23/17 0738  GLUCAP 87 103* 95 115*   No results for input(s): PHART, PCO2ART, PO2ART in the last 168 hours. Recent Labs  Lab 03/19/17 0452  AST 102*  ALT 87*  ALKPHOS 128*  BILITOT 0.6  ALBUMIN 3.3*    Cardiac Enzymes Recent Labs  Lab 03/25/17 0841  TROPONINI 0.05*    RADIOLOGY:  Dg Chest Portable 1 View  Result Date: 03/25/2017 CLINICAL DATA:  Acute respiratory distress. EXAM: PORTABLE CHEST 1 VIEW COMPARISON:  Chest radiograph March 19, 2016 and chest radiograph February 07, 2017 FINDINGS: Cardiac silhouette is mildly enlarged and unchanged. Status post median sternotomy for CABG. Calcified aortic knob. Mild chronic interstitial changes without pleural effusion or focal consolidation. Bandlike density LEFT lung base. No pneumothorax. Dual lead LEFT cardiac pacemaker in situ. Soft tissue planes and included osseous structures are unchanged. IMPRESSION: Mild cardiomegaly. Mild chronic interstitial changes with LEFT lung base atelectasis. Aortic Atherosclerosis (ICD10-I70.0). Electronically Signed   By: Elon Alas M.D.   On: 03/25/2017 06:15      Hermelinda Dellen, DO  03/25/2017, 11:09 AM

## 2017-03-25 NOTE — ED Triage Notes (Signed)
Pt arrived via ems from home with complaints of shortness of breath that started today with no relief from his inhaler.  Pt has history of COPD and recently has heart surgery at [facility].  EMS administered 2 duo nebs, 1 albuterol, 125mg  Solu Medrol, and 2mg  Mg+ en route in route.  Patient is 100% on 15L NRB.  Upon arrival pt's breathing labored and wheezing heard on auscultation.  MD present during triage.

## 2017-03-25 NOTE — ED Notes (Signed)
Freida, RRT at bedside to change BiPAP tubing.

## 2017-03-25 NOTE — ED Notes (Signed)
Freida, RRT to help transport upon her return to ED from CT with another pt.

## 2017-03-25 NOTE — ED Provider Notes (Signed)
[facility] Emergency Department Provider Note   ____________________________________________   First MD Initiated Contact with Patient 03/25/17 [phone]     (approximate)  I have reviewed the triage vital signs and the nursing notes.   HISTORY  Chief Complaint Respiratory Distress  Patient in respiratory distress and on BiPAP  HPI Donald Juarez is a 72 y.o. male who comes into the hospital today with some breathing problems.  The patient has a history of COPD and was hospitalized 3 times recently.  According to EMS the patient's oxygen saturation was 84-87% on room air when they arrived.  He was placed on CPAP given his respiratory distress and he was brought into the be followed up.  EMS states that they did give the patient 2 DuoNeb's, 1 albuterol, Solu-Medrol and magnesium sulfate.  The patient was admitted to the hospital last week and discharged on Friday.  He reports that he was doing well up until yesterday when he started coughing.  He reports that he is coughing up some thin white phlegm.  He states that the cough persisted it was worse today.  He denies any chest pains.  He is here today for evaluation.   Past Medical History:  Diagnosis Date  . Anginal pain ([place])    feels this as jaw pain.  takes imdur for this. rarely uses nitro.  [name] Anxiety   . Arthritis   . Asthma   . Bilateral claudication of lower limb ([place])   . Carotid stenosis    a. 07/2013 Carotid U/S: 100% RICA (pt prev reported h/o 03%), LICA 50-09%, bilat ECA 50%, normal vertebrals and subclavians bilat-->F/U needed in 01/2014.  . Cataract of left eye   . Cataract, right    a. 10/2010 s/p cataract surgery   . COPD (chronic obstructive pulmonary disease) ([place])   . Coronary artery disease    a. 06/1996 & 03/2005 Cath: non-obs dzs;  b. 07/2009 NSTEMI/Cath: LCX 99->PCI/DES extending into OM1;  c. 01/2013 Cath: patent LCX stent-->Med Rx.; d. cath 03/2014: RPAV dzs->Med rx; e. 10/2014 Cath/PCI: LM  30, LAD 20p/m, LCX 50ost/p, 10 ISR, 60d, OM1 min irregs, OM2 50, RCA 30ost, 66m, 60d, RPDA 70(2.5x8 Xience DES), RPAV 90small(PTCA); f. 10/2014 Relook Cath: RPAV 99(Med Rx), EF 45-50.  [name] Dysrhythmia    since ablation, flutter resolved.  . Essential hypertension   . Hyperkalemia   . Hyperlipidemia   . Myocardial infarction ([place]) 2015   had stent inserted and then had heart attack after. stent inserted due to blockage  . Paroxysmal atrial flutter ([place])    a. CHA2DS2VASc = 3 -> eliquis;  b. 07/2013 Echo: EF 50-55%, mildly dil LA.c. s/p RFCA 10/31/2013 by Dr Caryl Comes  . Prostate cancer ([place])   . Pulmonary nodule, right    a. 0.7cm RLL - stable by CT 06/2013.  [name] Rectal cancer ([place])    a. Status post colostomy  . Seizures ([place])    a. last 30 years ago  . Shingles    a. 09/2012.  . SSS (sick sinus syndrome) ([place])    a. 12/2009 S/P MDT [place] ADDR01 DC PPM, ser # [id number] ([name]).  . Syncope and collapse     Patient Active Problem List   Diagnosis Date Noted  . Acute respiratory failure ([place]) 03/22/2017  . Acute respiratory failure with hypoxia ([place]) 03/19/2017  . Acute CHF (congestive heart failure) ([place]) 03/12/2017  . Primary localized osteoarthritis of right knee 01/14/2016  . Essential hypertension   .  Hyperlipidemia   . SSS (sick sinus syndrome) ([place])   . Unstable angina ([place]) 11/03/2014  . NSTEMI (non-ST elevated myocardial infarction) ([place])   . Effort angina ([place]) 11/02/2014  . S/P PTCA (percutaneous transluminal coronary angioplasty) 11/02/2014  . Coronary artery disease involving native coronary artery   . Prostate cancer ([place])   . Anxiety and depression 06/18/2014  . Fatigue 11/14/2013  . Typical atrial flutter ([place]) 10/31/2013  . Bilateral claudication of lower limb ([place])   . Coronary artery disease   . Paroxysmal atrial flutter ([name])   . Hypertension   . Carotid stenosis   . COPD, severe GOLD GRADE D 03/28/2013  . Solitary pulmonary nodule 03/28/2013    Past  Surgical History:  Procedure Laterality Date  . ABLATION  10/31/2013   CTI ablation by Dr Caryl Comes for atrial flutter  . ATRIAL FLUTTER ABLATION N/A 10/31/2013   Procedure: ATRIAL FLUTTER ABLATION;  Surgeon: [name], MD;  Location: [facility] CATH LAB;  Service: Cardiovascular;  Laterality: N/A;  . CARDIAC CATHETERIZATION  07[facility]2009   [facility]  . CARDIAC CATHETERIZATION  01/2013   [facility]  . CARDIAC CATHETERIZATION  03/2014   [facility]  . CARDIAC CATHETERIZATION N/A 11/02/2014   Procedure: Left Heart Cath and Cors/Grafts Angiography;  Surgeon: [name], MD;  Location: [place] CV LAB;  Service: Cardiovascular;  Laterality: N/A;  . CARDIAC CATHETERIZATION N/A 11/02/2014   Procedure: Coronary Stent Intervention;  Surgeon: [name], MD;  Location: [place] CV LAB;  Service: Cardiovascular;  Laterality: N/A;  . CARDIAC CATHETERIZATION N/A 11/03/2014   Procedure: Left Heart Cath and Coronary Angiography;  Surgeon: [name], MD;  Location: [place] CV LAB;  Service: Cardiovascular;  Laterality: N/A;  . CARDIAC SURGERY  2011   Stents placed   . CATARACT EXTRACTION Left   . CATARACT EXTRACTION W/PHACO Left 06/01/2014   Procedure: CATARACT EXTRACTION PHACO AND INTRAOCULAR LENS PLACEMENT (IOC);  Surgeon: [name], MD;  Location: [name] ORS;  Service: Ophthalmology;  Laterality: Left;  . cataract surgery  2012  . colon surgery     cancer removal  . COLON SURGERY     colostomy  . COLONOSCOPY WITH PROPOFOL N/A 05/24/2016   Procedure: COLONOSCOPY WITH PROPOFOL;  Surgeon: [name], MD;  Location: [facility] ENDOSCOPY;  Service: Endoscopy;  Laterality: N/A;  . CORONARY ANGIOPLASTY    . INSERT / REPLACE / REMOVE PACEMAKER  2011   just a pacer  . KNEE ARTHROSCOPY Right 1980's  . LEFT HEART CATH AND CORONARY ANGIOGRAPHY N/A 02/08/2017   Procedure: LEFT HEART CATH AND CORONARY ANGIOGRAPHY;  Surgeon: [name], MD;  Location: [place] CV LAB;  Service: Cardiovascular;   Laterality: N/A;  . PACEMAKER PLACEMENT  01/17/2010  . TOTAL KNEE ARTHROPLASTY Right 01/14/2016   Procedure: TOTAL KNEE ARTHROPLASTY;  Surgeon: [name], MD;  Location: [name] ORS;  Service: Orthopedics;  Laterality: Right;    Prior to Admission medications   Medication Sig Start Date End Date Taking? Authorizing Provider  albuterol (PROAIR HFA) 108 (90 Base) MCG/ACT inhaler Inhale 2 puffs into the lungs every 4 (four) hours as needed for wheezing or shortness of breath. 07/11/16  Yes Flora Lipps, MD  albuterol (PROVENTIL) (2.5 MG/3ML) 0.083% nebulizer solution Take 3 mLs (2.5 mg total) by nebulization every 6 (six) hours as needed for wheezing or shortness of breath. DX:J44.9 10/04/16  Yes Flora Lipps, MD  amiodarone (PACERONE) 200 MG tablet Take 200 mg by mouth daily. 02/26/17 03/28/17  Yes [provider]  apixaban (ELIQUIS) 5 MG TABS tablet Take 5 mg by mouth 2 (two) times daily. 02/26/17  Yes [provider]  arformoterol (BROVANA) 15 MCG/2ML NEBU Take 2 mLs (15 mcg total) by nebulization 2 (two) times daily. Take 2 mLs (15 mcg total) by nebulization 2 (two) times daily. DX code 493.20 Every morning and 1800 07/11/16  Yes Flora Lipps, MD  aspirin EC 81 MG tablet Take 81 mg by mouth daily.   Yes [provider]  atorvastatin (LIPITOR) 80 MG tablet Take 1 tablet (80 mg total) by mouth daily. 05/09/16 03/12/18 Yes Wende Bushy, MD  budesonide (PULMICORT) 0.5 MG/2ML nebulizer solution Take 2 mLs (0.5 mg total) by nebulization 2 (two) times daily. DX code 493.20 Every morning and 1800 07/11/16  Yes Kasa, Maretta Bees, MD  budesonide-formoterol (SYMBICORT) 160-4.5 MCG/ACT inhaler Inhale 2 puffs into the lungs 2 (two) times daily.   Yes [provider]  Cholecalciferol (VITAMIN D) 2000 UNITS tablet Take 2,000 Units by mouth daily.   Yes [provider]  ciprofloxacin (CIPRO) 500 MG tablet Take 1 tablet (500 mg total) by mouth 2 (two) times daily for 3 days.  03/23/17 03/26/17 Yes Epifanio Lesches, MD  furosemide (LASIX) 20 MG tablet Take 1 tablet (20 mg total) by mouth daily. 03/14/17  Yes Gladstone Lighter, MD  guaiFENesin (MUCINEX) 600 MG 12 hr tablet Take 1 tablet (600 mg total) by mouth 2 (two) times daily. 03/14/17  Yes Gladstone Lighter, MD  isosorbide mononitrate (IMDUR) 30 MG 24 hr tablet Take 1 tablet (30 mg total) by mouth daily. 03/14/17 03/14/18 Yes Gladstone Lighter, MD  meclizine (ANTIVERT) 12.5 MG tablet Take 1-2 tablets by mouth 3 (three) times daily as needed. 01/05/17  Yes [provider]  metoprolol tartrate (LOPRESSOR) 25 MG tablet Take 0.5 tablets (12.5 mg total) by mouth 2 (two) times daily. 03/23/17  Yes Epifanio Lesches, MD  nitroGLYCERIN (NITROSTAT) 0.4 MG SL tablet Place 1 tablet (0.4 mg total) under the tongue every 5 (five) minutes as needed for chest pain. 05/09/16  Yes Wende Bushy, MD  omeprazole (PRILOSEC) 40 MG capsule Take 40 mg by mouth daily.   Yes [provider]  PARoxetine (PAXIL) 30 MG tablet Take 30 mg by mouth daily.    Yes [provider]  predniSONE (STERAPRED UNI-PAK 21 TAB) 10 MG (21) TBPK tablet 6 tabs PO x 1 day 5 tabs PO x 1 day 4 tabs PO x 1 day 3 tabs PO x 1 day 2 tabs PO x 1 day 1 tab PO x 1 day and stop 03/14/17  Yes Gladstone Lighter, MD  Tiotropium Bromide Monohydrate (SPIRIVA RESPIMAT) 1.25 MCG/ACT AERS Inhale 2 puffs into the lungs 2 (two) times daily. 07/11/16  Yes Flora Lipps, MD  traMADol (ULTRAM) 50 MG tablet Take 1-2 tablets (50-100 mg total) by mouth every 4 (four) hours as needed for moderate pain. Patient taking differently: Take 50 mg by mouth every 6 (six) hours as needed for moderate pain.  01/16/16  Yes Duanne Guess, PA-C    Allergies Patient has no known allergies.  Family History  Problem Relation Age of Onset  . Cancer Mother        'male cancer"  . Hypertension Mother   . Hyperlipidemia Mother   . Heart disease Father   . Heart  attack Father   . Hypertension Father   . Hyperlipidemia Father     Social History Social History   Tobacco Use  .  Smoking status: Former Smoker    Packs/day: 1.00    Years: 30.00    Pack years: 30.00    Types: Cigarettes    Last attempt to quit: 07/26/2008    Years since quitting: 8.6  . Smokeless tobacco: Never Used  Substance Use Topics  . Alcohol use: No  . Drug use: No    Review of Systems  Constitutional: No fever/chills Eyes: No visual changes. ENT: No sore throat. Cardiovascular: Denies chest pain. Respiratory: Cough and shortness of breath. Gastrointestinal: No abdominal pain.  No nausea, no vomiting.  No diarrhea.  No constipation. Genitourinary: Negative for dysuria. Musculoskeletal: Negative for back pain. Skin: Negative for rash. Neurological: Negative for headaches, focal weakness or numbness.   ____________________________________________   PHYSICAL EXAM:  VITAL SIGNS: ED Triage Vitals  Enc Vitals Group     BP 03/25/17 0558 120/82     Pulse Rate 03/25/17 0558 89     Resp --      Temp 03/25/17 0558 98 F (36.7 C)     Temp Source 03/25/17 0558 Oral     SpO2 03/25/17 0541 97 %     Weight 03/25/17 0559 207 lb 3.2 oz (94 kg)     Height --      Head Circumference --      Peak Flow --      Pain Score --      Pain Loc --      Pain Edu? --      Excl. in [place]? --     Constitutional: Alert and oriented. Ill appearing and in moderate distress. Eyes: Conjunctivae are normal. PERRL. EOMI. Head: Atraumatic. Nose: No congestion/rhinnorhea. Mouth/Throat: Mucous membranes are moist.  Oropharynx non-erythematous. Cardiovascular: Normal rate, regular rhythm. Grossly normal heart sounds.  Good peripheral circulation. Respiratory: Increased respiratory effort.  No retractions.  Expiratory wheezes in all lung fields Gastrointestinal: Soft and nontender. No distention. No abdominal bruits. No CVA tenderness. Musculoskeletal: No lower extremity tenderness nor  edema.   Neurologic:  Normal speech and language.  Skin:  Skin is warm, dry and intact.  Psychiatric: Mood and affect are normal.   ____________________________________________   LABS (all labs ordered are listed, but only abnormal results are displayed)  Labs Reviewed  BLOOD GAS, VENOUS - Abnormal; Notable for the following components:      Result Value   Bicarbonate 29.7 (*)    Acid-Base Excess 4.0 (*)    All other components within normal limits  CBC - Abnormal; Notable for the following components:   WBC 13.1 (*)    RBC 4.20 (*)    Hemoglobin 12.9 (*)    HCT 38.7 (*)    RDW 14.9 (*)    All other components within normal limits  BASIC METABOLIC PANEL  TROPONIN I   ____________________________________________  EKG  ED ECG REPORT I, Loney Hering, the attending physician, personally viewed and interpreted this ECG.   Date: 03/25/2017  EKG Time: 0448  Rate: 74  Rhythm: normal sinus rhythm  Axis: nomrmal  Intervals:left bundle branch block  ST&T Change: flipped t waves in lead V2, V3, V4, V5, V6, ST depression lead II, III, avG  ____________________________________________  RADIOLOGY  ED MD interpretation:  CXR: NAD  Official radiology report(s): Dg Chest Portable 1 View  Result Date: 03/25/2017 CLINICAL DATA:  Acute respiratory distress. EXAM: PORTABLE CHEST 1 VIEW COMPARISON:  Chest radiograph March 19, 2016 and chest radiograph February 07, 2017 FINDINGS: Cardiac silhouette is mildly enlarged and  unchanged. Status post median sternotomy for CABG. Calcified aortic knob. Mild chronic interstitial changes without pleural effusion or focal consolidation. Bandlike density LEFT lung base. No pneumothorax. Dual lead LEFT cardiac pacemaker in situ. Soft tissue planes and included osseous structures are unchanged. IMPRESSION: Mild cardiomegaly. Mild chronic interstitial changes with LEFT lung base atelectasis. Aortic Atherosclerosis (ICD10-I70.0). Electronically  Signed   By: Elon Alas M.D.   On: 03/25/2017 06:15    ____________________________________________   PROCEDURES  Procedure(s) performed: please, see procedure note(s).  .Critical Care Performed by: Loney Hering, MD Authorized by: Loney Hering, MD   Critical care provider statement:    Critical care time (minutes):  30   Critical care start time:  03/25/2017 5:36 AM   Critical care end time:  03/25/2017 6:06 AM   Critical care time was exclusive of:  Separately billable procedures and treating other patients   Critical care was necessary to treat or prevent imminent or life-threatening deterioration of the following conditions:  Respiratory failure   Critical care was time spent personally by me on the following activities:  Development of treatment plan with patient or surrogate, evaluation of patient's response to treatment, examination of patient, obtaining history from patient or surrogate, ordering and performing treatments and interventions, ordering and review of laboratory studies, ordering and review of radiographic studies, pulse oximetry, re-evaluation of patient's condition and review of old charts   I assumed direction of critical care for this patient from another provider in my specialty: no      Critical Care performed: Yes, see critical care note(s)  ____________________________________________   INITIAL IMPRESSION / ASSESSMENT AND PLAN / ED COURSE  As part of my medical decision making, I reviewed the following data within the electronic MEDICAL RECORD NUMBER Notes from prior ED visits and  Controlled Substance Database   This is a 72 year old male who comes into the hospital today with some shortness of breath.  The patient was recently discharged from the hospital with a COPD exacerbation after being placed on BiPAP.  My differential diagnosis includes COPD exacerbation, pneumonia, bronchitis, congestive heart failure.   I did order a CBC a  BMP a troponin and a blood gas on the patient.  The patient's CBC shows a white blood cell count of 13 but his VBG is unremarkable.  The patient's chest x-ray showed some mild cardiomegaly with some chronic interstitial left lung changes but no acute pneumonia or any acute illness.  The patient was placed on BiPAP when he initially arrived to the emergency department.  He had received 2 DuoNeb's and some Solu-Medrol but I will give him a third DuoNeb.  I will continue to monitor the patient while he is in the emergency department.    The patient was placed on BiPAP when he initially arrived and was doing well.  The patient does not have pneumonia so I will admit the patient to the hospitalist service. ____________________________________________   FINAL CLINICAL IMPRESSION(S) / ED DIAGNOSES  Final diagnoses:  COPD exacerbation ([place])  Shortness of breath     ED Discharge Orders    None       Note:  This document was prepared using Dragon voice recognition software and may include unintentional dictation errors.    Loney Hering, MD 03/25/17 [phone]

## 2017-03-25 NOTE — Progress Notes (Signed)
Still breathing at 30/min on bipap. Needs stepdown. Nebs, steroids, lasix. Bipap.

## 2017-03-25 NOTE — ED Notes (Signed)
This RN called Siri Cole, RRT to request BiPAP tubing with chamber so we can administer breathing treatment.

## 2017-03-25 NOTE — H&P (Signed)
[name] NAME: Donald Juarez    MR#:  [id number]  DATE OF BIRTH:  02/27/1945  DATE OF ADMISSION:  03/25/2017  PRIMARY CARE PHYSICIAN: Sofie Hartigan, MD   REQUESTING/REFERRING PHYSICIAN: Dr. Dahlia Client  CHIEF COMPLAINT:   Chief Complaint  Patient presents with  . Respiratory Distress    HISTORY OF PRESENT ILLNESS:  [name]  is a 72 y.o. male with a known history of CAD, chronic systolic CHF with ejection fraction 35%, COPD, hypertension not on home oxygen who was recently discharged on 03/23/2017 from the hospital after being treated for COPD returns due to worsening shortness of breath.  Patient mentions that when he left on Friday he was feeling well close to normal.  After he went home he slowly started getting his symptoms back.  Had clear sputum.  No chest pain.  When EMS saw the patient his saturations were 60% on room air and acutely short of breath and placed on CPAP.  He received IV steroids, nebulizers and magnesium for his shortness of breath. Presently he does not feel any significant improvement since arriving at the emergency room. Chest x-ray shows mild pulmonary edema no infiltrates.  PAST MEDICAL HISTORY:   Past Medical History:  Diagnosis Date  . Anginal pain ([place])    feels this as jaw pain.  takes imdur for this. rarely uses nitro.  [name] Anxiety   . Arthritis   . Asthma   . Bilateral claudication of lower limb ([place])   . Carotid stenosis    a. 07/2013 Carotid U/S: 100% RICA (pt prev reported h/o 94%), LICA 17-40%, bilat ECA 50%, normal vertebrals and subclavians bilat-->F/U needed in 01/2014.  . Cataract of left eye   . Cataract, right    a. 10/2010 s/p cataract surgery   . COPD (chronic obstructive pulmonary disease) ([place])   . Coronary artery disease    a. 06/1996 & 03/2005 Cath: non-obs dzs;  b. 07/2009 NSTEMI/Cath: LCX 99->PCI/DES extending into OM1;  c. 01/2013 Cath: patent LCX stent-->Med Rx.; d. cath  03/2014: RPAV dzs->Med rx; e. 10/2014 Cath/PCI: LM 30, LAD 20p/m, LCX 50ost/p, 10 ISR, 60d, OM1 min irregs, OM2 50, RCA 30ost, 36m, 60d, RPDA 70(2.5x8 Xience DES), RPAV 90small(PTCA); f. 10/2014 Relook Cath: RPAV 99(Med Rx), EF 45-50.  [name] Dysrhythmia    since ablation, flutter resolved.  . Essential hypertension   . Hyperkalemia   . Hyperlipidemia   . Myocardial infarction ([place]) 2015   had stent inserted and then had heart attack after. stent inserted due to blockage  . Paroxysmal atrial flutter ([place])    a. CHA2DS2VASc = 3 -> eliquis;  b. 07/2013 Echo: EF 50-55%, mildly dil LA.c. s/p RFCA 10/31/2013 by Dr Caryl Comes  . Prostate cancer ([place])   . Pulmonary nodule, right    a. 0.7cm RLL - stable by CT 06/2013.  [name] Rectal cancer ([place])    a. Status post colostomy  . Seizures ([place])    a. last 30 years ago  . Shingles    a. 09/2012.  . SSS (sick sinus syndrome) ([place])    a. 12/2009 S/P MDT Adapta ADDR01 DC PPM, ser # [id number] ([name]).  . Syncope and collapse     PAST SURGICAL HISTORY:   Past Surgical History:  Procedure Laterality Date  . ABLATION  10/31/2013   CTI ablation by Dr Caryl Comes for atrial flutter  . ATRIAL FLUTTER ABLATION N/A 10/31/2013   Procedure: ATRIAL FLUTTER ABLATION;  Surgeon: [name], MD;  Location: [facility] CATH LAB;  Service: Cardiovascular;  Laterality: N/A;  . CARDIAC CATHETERIZATION  07/2009   [facility]  . CARDIAC CATHETERIZATION  01/2013   [facility]  . CARDIAC CATHETERIZATION  03/2014   [facility]  . CARDIAC CATHETERIZATION N/A 11/02/2014   Procedure: Left Heart Cath and Cors/Grafts Angiography;  Surgeon: [name], MD;  Location: [place] [facility] CV LAB;  Service: Cardiovascular;  Laterality: N/A;  . CARDIAC CATHETERIZATION N/A 11/02/2014   Procedure: Coronary Stent Intervention;  Surgeon: [name], MD;  Location: [place] CV LAB;  Service: Cardiovascular;  Laterality: N/A;  . CARDIAC CATHETERIZATION N/A 11/03/2014   Procedure: Left Heart Cath and Coronary Angiography;   Surgeon: [name], MD;  Location: [place] CV LAB;  Service: Cardiovascular;  Laterality: N/A;  . CARDIAC SURGERY  2011   Stents placed   . CATARACT EXTRACTION Left   . CATARACT EXTRACTION W/PHACO Left 06/01/2014   Procedure: CATARACT EXTRACTION PHACO AND INTRAOCULAR LENS PLACEMENT (IOC);  Surgeon: [name], MD;  Location: [name] ORS;  Service: Ophthalmology;  Laterality: Left;  . cataract surgery  2012  . colon surgery     cancer removal  . COLON SURGERY     colostomy  . COLONOSCOPY WITH PROPOFOL N/A 05/24/2016   Procedure: COLONOSCOPY WITH PROPOFOL;  Surgeon: [name], MD;  Location: [facility] ENDOSCOPY;  Service: Endoscopy;  Laterality: N/A;  . CORONARY ANGIOPLASTY    . INSERT / REPLACE / REMOVE PACEMAKER  2011   just a pacer  . KNEE ARTHROSCOPY Right 1980's  . LEFT HEART CATH AND CORONARY ANGIOGRAPHY N/A 02/08/2017   Procedure: LEFT HEART CATH AND CORONARY ANGIOGRAPHY;  Surgeon: [name], MD;  Location: [place] CV LAB;  Service: Cardiovascular;  Laterality: N/A;  . PACEMAKER PLACEMENT  01/17/2010  . TOTAL KNEE ARTHROPLASTY Right 01/14/2016   Procedure: TOTAL KNEE ARTHROPLASTY;  Surgeon: [name], MD;  Location: [name] ORS;  Service: Orthopedics;  Laterality: Right;    SOCIAL HISTORY:   Social History   Tobacco Use  . Smoking status: Former Smoker    Packs/day: 1.00    Years: 30.00    Pack years: 30.00    Types: Cigarettes    Last attempt to quit: 07/26/2008    Years since quitting: 8.6  . Smokeless tobacco: Never Used  Substance Use Topics  . Alcohol use: No    FAMILY HISTORY:   Family History  Problem Relation Age of Onset  . Cancer Mother        'male cancer"  . Hypertension Mother   . Hyperlipidemia Mother   . Heart disease Father   . Heart attack Father   . Hypertension Father   . Hyperlipidemia Father     DRUG ALLERGIES:  No Known Allergies  REVIEW OF SYSTEMS:   Review of Systems  Constitutional: Positive  for malaise/fatigue. Negative for chills, fever and weight loss.  HENT: Negative for hearing loss and nosebleeds.   Eyes: Negative for blurred vision, double vision and pain.  Respiratory: Positive for cough, sputum production, shortness of breath and wheezing. Negative for hemoptysis.   Cardiovascular: Negative for chest pain, palpitations, orthopnea and leg swelling.  Gastrointestinal: Negative for abdominal pain, constipation, diarrhea, nausea and vomiting.  Genitourinary: Negative for dysuria and hematuria.  Musculoskeletal: Negative for back pain, falls and myalgias.  Skin: Negative for rash.  Neurological: Positive for weakness. Negative for dizziness, tremors, sensory change, speech change, focal weakness, seizures and headaches.  Endo/Heme/Allergies: Does not bruise/bleed easily.  Psychiatric/Behavioral: Negative for depression and memory loss. The patient is not nervous/anxious.     MEDICATIONS AT HOME:   Prior to Admission medications   Medication Sig Start Date End Date Taking? Authorizing Provider  albuterol (PROAIR HFA) 108 (90 Base) MCG/ACT inhaler Inhale 2 puffs into the lungs every 4 (four) hours as needed for wheezing or shortness of breath. 07/11/16  Yes Flora Lipps, MD  albuterol (PROVENTIL) (2.5 MG/3ML) 0.083% nebulizer solution Take 3 mLs (2.5 mg total) by nebulization every 6 (six) hours as needed for wheezing or shortness of breath. DX:J44.9 10/04/16  Yes Flora Lipps, MD  amiodarone (PACERONE) 200 MG tablet Take 200 mg by mouth daily. 02/26/17 03/28/17 Yes [provider]  apixaban (ELIQUIS) 5 MG TABS tablet Take 5 mg by mouth 2 (two) times daily. 02/26/17  Yes [provider]  arformoterol (BROVANA) 15 MCG/2ML NEBU Take 2 mLs (15 mcg total) by nebulization 2 (two) times daily. Take 2 mLs (15 mcg total) by nebulization 2 (two) times daily. DX code 493.20 Every morning and 1800 07/11/16  Yes Flora Lipps, MD  aspirin EC 81 MG tablet Take 81 mg by mouth  daily.   Yes [provider]  atorvastatin (LIPITOR) 80 MG tablet Take 1 tablet (80 mg total) by mouth daily. 05/09/16 03/12/18 Yes Wende Bushy, MD  budesonide (PULMICORT) 0.5 MG/2ML nebulizer solution Take 2 mLs (0.5 mg total) by nebulization 2 (two) times daily. DX code 493.20 Every morning and 1800 07/11/16  Yes Kasa, Maretta Bees, MD  budesonide-formoterol (SYMBICORT) 160-4.5 MCG/ACT inhaler Inhale 2 puffs into the lungs 2 (two) times daily.   Yes [provider]  Cholecalciferol (VITAMIN D) 2000 UNITS tablet Take 2,000 Units by mouth daily.   Yes [provider]  ciprofloxacin (CIPRO) 500 MG tablet Take 1 tablet (500 mg total) by mouth 2 (two) times daily for 3 days. 03/23/17 03/26/17 Yes Epifanio Lesches, MD  furosemide (LASIX) 20 MG tablet Take 1 tablet (20 mg total) by mouth daily. 03/14/17  Yes Gladstone Lighter, MD  guaiFENesin (MUCINEX) 600 MG 12 hr tablet Take 1 tablet (600 mg total) by mouth 2 (two) times daily. 03/14/17  Yes Gladstone Lighter, MD  isosorbide mononitrate (IMDUR) 30 MG 24 hr tablet Take 1 tablet (30 mg total) by mouth daily. 03/14/17 03/14/18 Yes Gladstone Lighter, MD  meclizine (ANTIVERT) 12.5 MG tablet Take 1-2 tablets by mouth 3 (three) times daily as needed. 01/05/17  Yes [provider]  metoprolol tartrate (LOPRESSOR) 25 MG tablet Take 0.5 tablets (12.5 mg total) by mouth 2 (two) times daily. 03/23/17  Yes Epifanio Lesches, MD  nitroGLYCERIN (NITROSTAT) 0.4 MG SL tablet Place 1 tablet (0.4 mg total) under the tongue every 5 (five) minutes as needed for chest pain. 05/09/16  Yes Wende Bushy, MD  omeprazole (PRILOSEC) 40 MG capsule Take 40 mg by mouth daily.   Yes [provider]  PARoxetine (PAXIL) 30 MG tablet Take 30 mg by mouth daily.    Yes [provider]  predniSONE (STERAPRED UNI-PAK 21 TAB) 10 MG (21) TBPK tablet 6 tabs PO x 1 day 5 tabs PO x 1 day 4 tabs PO x 1 day 3 tabs PO x 1 day 2 tabs PO x 1 day 1  tab PO x 1 day and stop 03/14/17  Yes Gladstone Lighter, MD  Tiotropium Bromide Monohydrate (SPIRIVA RESPIMAT) 1.25 MCG/ACT AERS Inhale 2 puffs into the lungs 2 (two) times daily. 07/11/16  Yes Kasa, Maretta Bees,  MD  traMADol (ULTRAM) 50 MG tablet Take 1-2 tablets (50-100 mg total) by mouth every 4 (four) hours as needed for moderate pain. Patient taking differently: Take 50 mg by mouth every 6 (six) hours as needed for moderate pain.  01/16/16  Yes Duanne Guess, PA-C     VITAL SIGNS:  Blood pressure 102/68, pulse (!) 109, temperature (!) 96.9 F (36.1 C), temperature source Axillary, resp. rate 19, height 5\' 9"  (1.753 m), weight 92.6 kg (204 lb 2.3 oz), SpO2 100 %.  PHYSICAL EXAMINATION:  Physical Exam  GENERAL:  72 y.o.-year-old patient lying in the bed , in respiratory distress.  Obese.  Looks critically ill. EYES: Pupils equal, round, reactive to light and accommodation. No scleral icterus. Extraocular muscles intact.  HEENT: Head atraumatic, normocephalic. Oropharynx and nasopharynx clear. No oropharyngeal erythema, moist oral mucosa  NECK:  Supple, no jugular venous distention. No thyroid enlargement, no tenderness.  LUNGS: Bilateral wheezing and crackles CARDIOVASCULAR: S1, S2 normal. No murmurs, rubs, or gallops.  ABDOMEN: Soft, nontender, nondistended. Bowel sounds present. No organomegaly or mass.  EXTREMITIES: No pedal edema, cyanosis, or clubbing. + 2 pedal & radial pulses b/l.   NEUROLOGIC: Cranial nerves II through XII are intact. No focal Motor or sensory deficits appreciated b/l PSYCHIATRIC: The patient is alert and oriented x 3. Anxious SKIN: Lower examinee wounds  LABORATORY PANEL:   CBC Recent Labs  Lab 03/25/17 0550  WBC 13.1*  HGB 12.9*  HCT 38.7*  PLT 291   ------------------------------------------------------------------------------------------------------------------  Chemistries  Recent Labs  Lab 03/19/17 0452  03/25/17 0841  NA 137   < > 134*  K  4.6   < > 4.1  CL 107   < > 98*  CO2 21*   < > 28  GLUCOSE 182*   < > 176*  BUN 31*   < > 26*  CREATININE 1.03   < > 1.03  CALCIUM 8.4*   < > 8.3*  AST 102*  --   --   ALT 87*  --   --   ALKPHOS 128*  --   --   BILITOT 0.6  --   --    < > = values in this interval not displayed.   ------------------------------------------------------------------------------------------------------------------  Cardiac Enzymes Recent Labs  Lab 03/25/17 0841  TROPONINI 0.05*   ------------------------------------------------------------------------------------------------------------------  RADIOLOGY:  Dg Chest Portable 1 View  Result Date: 03/25/2017 CLINICAL DATA:  Acute respiratory distress. EXAM: PORTABLE CHEST 1 VIEW COMPARISON:  Chest radiograph March 19, 2016 and chest radiograph February 07, 2017 FINDINGS: Cardiac silhouette is mildly enlarged and unchanged. Status post median sternotomy for CABG. Calcified aortic knob. Mild chronic interstitial changes without pleural effusion or focal consolidation. Bandlike density LEFT lung base. No pneumothorax. Dual lead LEFT cardiac pacemaker in situ. Soft tissue planes and included osseous structures are unchanged. IMPRESSION: Mild cardiomegaly. Mild chronic interstitial changes with LEFT lung base atelectasis. Aortic Atherosclerosis (ICD10-I70.0). Electronically Signed   By: Elon Alas M.D.   On: 03/25/2017 06:15     IMPRESSION AND PLAN:   *Acute hypoxic respiratory failure secondary to COPD exacerbation and acute on chronic systolic CHF Patient acutely short of breath.  Will be placed on BiPAP.  Nebulizers scheduled.  Admit to stepdown unit.  Discussed with Dr. Jefferson Fuel of ICU.  Critically ill.  *Acute COPD exacerbation. -IV steroids - Scheduled Nebulizers - Inhalers -Wean O2 as tolerated - Consult pulmonary   *Acute on chronic systolic congestive heart failure.  Ejection fraction 35-40%.  Will give him stat dose of IV Lasix.   Further diuresis in the ICU.  *CAD with CABG. Seems stable.  *DVT prophylaxis with Lovenox   All the records are reviewed and case discussed with ED provider. Management plans discussed with the patient, family and they are in agreement.  CODE STATUS: FULL CODE  TOTAL CC TIME TAKING CARE OF THIS PATIENT: 45 minutes.   Leia Alf Vonetta Foulk M.D on 03/25/2017 at 12:46 PM  Between 7am to 6pm - Pager - [phone]  After 6pm go to [email or web address] - password [email or web address] [facility]  Office  [phone]  CC: Primary care physician; Sofie Hartigan, MD  Note: This dictation was prepared with Dragon dictation along with smaller phrase technology. Any transcriptional errors that result from this process are unintentional.

## 2017-03-26 LAB — CBC
HEMATOCRIT: 38 % — AB (ref 40.0–52.0)
Hemoglobin: 12.5 g/dL — ABNORMAL LOW (ref 13.0–18.0)
MCH: 30.2 pg (ref 26.0–34.0)
MCHC: 32.8 g/dL (ref 32.0–36.0)
MCV: 92 fL (ref 80.0–100.0)
Platelets: 258 10*3/uL (ref 150–440)
RBC: 4.12 MIL/uL — AB (ref 4.40–5.90)
RDW: 15 % — ABNORMAL HIGH (ref 11.5–14.5)
WBC: 13.7 10*3/uL — AB (ref 3.8–10.6)

## 2017-03-26 LAB — BASIC METABOLIC PANEL
Anion gap: 10 (ref 5–15)
BUN: 33 mg/dL — ABNORMAL HIGH (ref 6–20)
CALCIUM: 8.6 mg/dL — AB (ref 8.9–10.3)
CO2: 27 mmol/L (ref 22–32)
CREATININE: 0.85 mg/dL (ref 0.61–1.24)
Chloride: 98 mmol/L — ABNORMAL LOW (ref 101–111)
Glucose, Bld: 215 mg/dL — ABNORMAL HIGH (ref 65–99)
Potassium: 3.7 mmol/L (ref 3.5–5.1)
SODIUM: 135 mmol/L (ref 135–145)

## 2017-03-26 LAB — GLUCOSE, CAPILLARY: Glucose-Capillary: 205 mg/dL — ABNORMAL HIGH (ref 65–99)

## 2017-03-26 MED ORDER — FUROSEMIDE 10 MG/ML IJ SOLN
40.0000 mg | Freq: Once | INTRAMUSCULAR | Status: AC
  Administered 2017-03-26: 40 mg via INTRAVENOUS
  Filled 2017-03-26: qty 4

## 2017-03-26 MED ORDER — METOPROLOL TARTRATE 25 MG PO TABS
12.5000 mg | ORAL_TABLET | Freq: Two times a day (BID) | ORAL | Status: DC
  Administered 2017-03-26 – 2017-03-28 (×4): 12.5 mg via ORAL
  Filled 2017-03-26 (×4): qty 1

## 2017-03-26 MED ORDER — AMIODARONE HCL 200 MG PO TABS
200.0000 mg | ORAL_TABLET | Freq: Every day | ORAL | Status: DC
  Administered 2017-03-26 – 2017-03-28 (×3): 200 mg via ORAL
  Filled 2017-03-26 (×3): qty 1

## 2017-03-26 MED ORDER — APIXABAN 5 MG PO TABS
5.0000 mg | ORAL_TABLET | Freq: Two times a day (BID) | ORAL | Status: DC
  Administered 2017-03-26 – 2017-03-28 (×4): 5 mg via ORAL
  Filled 2017-03-26 (×4): qty 1

## 2017-03-26 MED ORDER — POTASSIUM CHLORIDE CRYS ER 20 MEQ PO TBCR
40.0000 meq | EXTENDED_RELEASE_TABLET | Freq: Once | ORAL | Status: AC
  Administered 2017-03-26: 40 meq via ORAL
  Filled 2017-03-26: qty 2

## 2017-03-26 MED ORDER — ASPIRIN EC 81 MG PO TBEC
81.0000 mg | DELAYED_RELEASE_TABLET | Freq: Every day | ORAL | Status: DC
  Administered 2017-03-26 – 2017-03-28 (×3): 81 mg via ORAL
  Filled 2017-03-26 (×3): qty 1

## 2017-03-26 MED ORDER — ATORVASTATIN CALCIUM 20 MG PO TABS
80.0000 mg | ORAL_TABLET | Freq: Every day | ORAL | Status: DC
  Administered 2017-03-26 – 2017-03-27 (×2): 80 mg via ORAL
  Filled 2017-03-26 (×2): qty 4

## 2017-03-26 MED ORDER — ISOSORBIDE MONONITRATE ER 30 MG PO TB24
30.0000 mg | ORAL_TABLET | Freq: Every day | ORAL | Status: DC
  Administered 2017-03-26 – 2017-03-28 (×3): 30 mg via ORAL
  Filled 2017-03-26 (×3): qty 1

## 2017-03-26 MED ORDER — IPRATROPIUM-ALBUTEROL 0.5-2.5 (3) MG/3ML IN SOLN
3.0000 mL | RESPIRATORY_TRACT | Status: DC
  Administered 2017-03-26 – 2017-03-27 (×3): 3 mL via RESPIRATORY_TRACT
  Filled 2017-03-26 (×3): qty 3

## 2017-03-26 MED ORDER — PAROXETINE HCL 30 MG PO TABS
30.0000 mg | ORAL_TABLET | Freq: Every day | ORAL | Status: DC
  Administered 2017-03-27 – 2017-03-28 (×2): 30 mg via ORAL
  Filled 2017-03-26 (×4): qty 1

## 2017-03-26 MED ORDER — BUDESONIDE 0.5 MG/2ML IN SUSP
0.5000 mg | Freq: Two times a day (BID) | RESPIRATORY_TRACT | Status: DC
  Administered 2017-03-26 – 2017-03-28 (×4): 0.5 mg via RESPIRATORY_TRACT
  Filled 2017-03-26 (×4): qty 2

## 2017-03-26 NOTE — Progress Notes (Signed)
PT Cancellation Note  Patient Details Name: ROALD LUKACS MRN: [id number] DOB: 1945/04/13   Cancelled Treatment:    Reason Eval/Treat Not Completed: Patient declined PT eval secondary to SOB at rest and fearful to exert himself.  Pt's SpO2 was 98% with HR in the low 100s, nursing notified.  Will attempt to see pt at a future date and time as medically appropriate.     Linus Salmons PT, DPT 03/26/17, 3:42 PM

## 2017-03-26 NOTE — Progress Notes (Signed)
Report given to Janett Billow, RN on 2A. Patient transported via wheelchair to room 246 by Gabriel Cirri, Therapist, sports.

## 2017-03-26 NOTE — Care Management (Signed)
Message left for [place] with [facility] that patient is back in hospital.

## 2017-03-26 NOTE — Progress Notes (Signed)
[facility] [place] NAME: Donald Juarez    MR#:  [id number]  DATE OF BIRTH:  09-11-1945  SUBJECTIVE:  CHIEF COMPLAINT:   Chief Complaint  Patient presents with  . Respiratory Distress   Still has some shortness of breath.  On oxygen. Afebrile.  REVIEW OF SYSTEMS:    Review of Systems  Constitutional: Positive for malaise/fatigue. Negative for chills and fever.  HENT: Negative for sore throat.   Eyes: Negative for blurred vision, double vision and pain.  Respiratory: Positive for cough and shortness of breath. Negative for hemoptysis and wheezing.   Cardiovascular: Negative for chest pain, palpitations, orthopnea and leg swelling.  Gastrointestinal: Negative for abdominal pain, constipation, diarrhea, heartburn, nausea and vomiting.  Genitourinary: Negative for dysuria and hematuria.  Musculoskeletal: Negative for back pain and joint pain.  Skin: Negative for rash.  Neurological: Positive for weakness. Negative for sensory change, speech change, focal weakness and headaches.  Endo/Heme/Allergies: Does not bruise/bleed easily.  Psychiatric/Behavioral: Negative for depression. The patient is not nervous/anxious.     DRUG ALLERGIES:  No Known Allergies  VITALS:  Blood pressure 118/80, pulse (!) 116, temperature 98.4 F (36.9 C), temperature source Oral, resp. rate 19, height 5\' 9"  (1.753 m), weight 91.2 kg (201 lb 1 oz), SpO2 97 %.  PHYSICAL EXAMINATION:   Physical Exam  GENERAL:  72 y.o.-year-old patient lying in the bed .  Feels better with his breathing.  Base EYES: Pupils equal, round, reactive to light and accommodation. No scleral icterus. Extraocular muscles intact.  HEENT: Head atraumatic, normocephalic. Oropharynx and nasopharynx clear.  NECK:  Supple, no jugular venous distention. No thyroid enlargement, no tenderness.  LUNGS: Normal breath sounds bilaterally, no wheezing, rales, rhonchi. No use of accessory muscles of  respiration.  CARDIOVASCULAR: S1, S2 normal. No murmurs, rubs, or gallops.  ABDOMEN: Soft, nontender, nondistended. Bowel sounds present. No organomegaly or mass.  EXTREMITIES: No cyanosis, clubbing or edema b/l.    NEUROLOGIC: Cranial nerves II through XII are intact. No focal Motor or sensory deficits b/l.   PSYCHIATRIC: The patient is alert and oriented x 3.  SKIN: No obvious rash, lesion, or ulcer.   LABORATORY PANEL:   CBC Recent Labs  Lab 03/26/17 0445  WBC 13.7*  HGB 12.5*  HCT 38.0*  PLT 258   ------------------------------------------------------------------------------------------------------------------ Chemistries  Recent Labs  Lab 03/26/17 0445  NA 135  K 3.7  CL 98*  CO2 27  GLUCOSE 215*  BUN 33*  CREATININE 0.85  CALCIUM 8.6*   ------------------------------------------------------------------------------------------------------------------  Cardiac Enzymes Recent Labs  Lab 03/25/17 0841  TROPONINI 0.05*   ------------------------------------------------------------------------------------------------------------------  RADIOLOGY:  Dg Chest Portable 1 View  Result Date: 03/25/2017 CLINICAL DATA:  Acute respiratory distress. EXAM: PORTABLE CHEST 1 VIEW COMPARISON:  Chest radiograph March 19, 2016 and chest radiograph February 07, 2017 FINDINGS: Cardiac silhouette is mildly enlarged and unchanged. Status post median sternotomy for CABG. Calcified aortic knob. Mild chronic interstitial changes without pleural effusion or focal consolidation. Bandlike density LEFT lung base. No pneumothorax. Dual lead LEFT cardiac pacemaker in situ. Soft tissue planes and included osseous structures are unchanged. IMPRESSION: Mild cardiomegaly. Mild chronic interstitial changes with LEFT lung base atelectasis. Aortic Atherosclerosis (ICD10-I70.0). Electronically Signed   By: Elon Alas M.D.   On: 03/25/2017 06:15     ASSESSMENT AND PLAN:   *Acute hypoxic  respiratory failure secondary to COPD exacerbation and acute on chronic systolic CHF Patient acutely short of breath.  Will be  placed on BiPAP.  Nebulizers scheduled.  Admit to stepdown unit.  Discussed with Dr. Jefferson Fuel of ICU.  Critically ill.  *Acute COPD exacerbation. -IV steroids - Scheduled Nebulizers - Inhalers -Wean O2 as tolerated - Consult pulmonary .  Discussed with Dr. Mortimer Fries  *Acute on chronic systolic congestive heart failure.  Ejection fraction 35-40%.   Needs ongoing diuresis with IV Lasix  *Atrial fibrillation.  On amiodarone and Eliquis at home.  *CAD with CABG. Seems stable.  *DVT prophylaxis  On Eliquis at home   All the records are reviewed and case discussed with Care Management/Social Worker Management plans discussed with the patient, family and they are in agreement.  CODE STATUS: FULL CODE  DVT Prophylaxis: SCDs  TOTAL TIME TAKING CARE OF THIS PATIENT: 30 minutes.   POSSIBLE D/C IN 1-2 DAYS, DEPENDING ON CLINICAL CONDITION.  [name] M.D on 03/26/2017 at 12:20 PM  Between 7am to 6pm - Pager - [phone]  After 6pm go to [email or web address] - password [email or web address] [facility]  Office  [phone]  CC: Primary care physician; Sofie Hartigan, MD  Note: This dictation was prepared with Dragon dictation along with smaller phrase technology. Any transcriptional errors that result from this process are unintentional.

## 2017-03-26 NOTE — Progress Notes (Signed)
Alert and oriented.  Off Bipap all shift.  Concerned about repeat admissions.  No home O2.  In no acute distress.  Will continue to monitor.

## 2017-03-27 ENCOUNTER — Ambulatory Visit: Payer: Medicare Other | Admitting: Family

## 2017-03-27 MED ORDER — FUROSEMIDE 10 MG/ML IJ SOLN
40.0000 mg | Freq: Two times a day (BID) | INTRAMUSCULAR | Status: DC
  Administered 2017-03-27 – 2017-03-28 (×3): 40 mg via INTRAVENOUS
  Filled 2017-03-27 (×3): qty 4

## 2017-03-27 MED ORDER — LEVALBUTEROL HCL 0.63 MG/3ML IN NEBU
0.6300 mg | INHALATION_SOLUTION | Freq: Four times a day (QID) | RESPIRATORY_TRACT | Status: DC
  Administered 2017-03-27 – 2017-03-28 (×6): 0.63 mg via RESPIRATORY_TRACT
  Filled 2017-03-27 (×7): qty 3

## 2017-03-27 MED ORDER — IPRATROPIUM BROMIDE 0.02 % IN SOLN
0.5000 mg | Freq: Four times a day (QID) | RESPIRATORY_TRACT | Status: DC
Start: 2017-03-27 — End: 2017-03-28
  Administered 2017-03-27 – 2017-03-28 (×7): 0.5 mg via RESPIRATORY_TRACT
  Filled 2017-03-27 (×8): qty 2.5

## 2017-03-27 NOTE — Evaluation (Signed)
Physical Therapy Evaluation Patient Details Name: Donald Juarez MRN: [id number] DOB: 03-14-1945 Today's Date: 03/27/2017   History of Present Illness  72 y.o. male with a known history of essential hypertension, CABG last month, was admitted with shortness of breath, discharged and returns with same.    Clinical Impression  Pt did well with standing balance, mobility, 300 ft of ambulation and stair negotiation and generally showed good confidence with all activity.  He had some minimal shortness of breath t/o the effort on room air but is safe and does not require further PT intervention at this time.     Follow Up Recommendations No PT follow up    Equipment Recommendations  None recommended by PT    Recommendations for Other Services       Precautions / Restrictions Precautions Precautions: (mod fall risk) Restrictions Weight Bearing Restrictions: No      Mobility  Bed Mobility Overal bed mobility: Independent                Transfers Overall transfer level: Independent                  Ambulation/Gait Ambulation/Gait assistance: Supervision Ambulation Distance (Feet): 300 Feet Assistive device: None       General Gait Details: Pt walked with great confidence and generally showed good balance, strength, etc.  Pt had some fatigue with the effort, unable to get accurate PulseOx reading but appeared to be high 80s to low 90s t/o session with only minimal SOB  Stairs            Wheelchair Mobility    Modified Rankin (Stroke Patients Only)       Balance                                             Pertinent Vitals/Pain Pain Assessment: No/denies pain    Home Living Family/patient expects to be discharged to:: Private residence Living Arrangements: Spouse/significant other(wife temorarily at rehab) Available Help at Discharge: (takes care of his wife) Type of Home: House Home Access: Level entry     Home Layout: Able  to live on main level with bedroom/bathroom;Multi-level Home Equipment: Walker - 2 wheels;Cane - single point;Bedside commode;Wheelchair - manual;Shower seat;Hospital bed      Prior Function Level of Independence: Independent               Hand Dominance        Extremity/Trunk Assessment   Upper Extremity Assessment Upper Extremity Assessment: Overall WFL for tasks assessed    Lower Extremity Assessment Lower Extremity Assessment: Overall WFL for tasks assessed       Communication   Communication: No difficulties  Cognition Arousal/Alertness: Awake/alert Behavior During Therapy: WFL for tasks assessed/performed Overall Cognitive Status: Within Functional Limits for tasks assessed                                 General Comments: Pt appropriate and pleasant t/o the session, though impulsive      General Comments      Exercises     Assessment/Plan    PT Assessment Patent does not need any further PT services  PT Problem List         PT Treatment Interventions      PT Goals (Current goals  can be found in the Care Plan section)  Acute Rehab PT Goals Patient Stated Goal: go home PT Goal Formulation: All assessment and education complete, DC therapy    Frequency (PT eval only)   Barriers to discharge        Co-evaluation               AM-PAC PT "6 Clicks" Daily Activity  Outcome Measure Difficulty turning over in bed (including adjusting bedclothes, sheets and blankets)?: None Difficulty moving from lying on back to sitting on the side of the bed? : None Difficulty sitting down on and standing up from a chair with arms (e.g., wheelchair, bedside commode, etc,.)?: None Help needed moving to and from a bed to chair (including a wheelchair)?: None Help needed walking in hospital room?: None Help needed climbing 3-5 steps with a railing? : None 6 Click Score: 24    End of Session Equipment Utilized During Treatment: Gait  belt Activity Tolerance: Patient tolerated treatment well(only very minimal shortness of breath) Patient left: in bed;with call bell/phone within reach        Time: 0902-0920 PT Time Calculation (min) (ACUTE ONLY): 18 min   Charges:   PT Evaluation $PT Eval Low Complexity: 1 Low     PT G CodesKreg Shropshire, DPT 03/27/2017, 10:42 AM

## 2017-03-27 NOTE — Progress Notes (Signed)
[facility] NAME: Donald Juarez    MR#:  [id number]  DATE OF BIRTH:  1945-09-17  SUBJECTIVE:  CHIEF COMPLAINT:   Chief Complaint  Patient presents with  . Respiratory Distress   Shortness of breath is improving.  Continues to be on 2 L oxygen.  REVIEW OF SYSTEMS:    Review of Systems  Constitutional: Positive for malaise/fatigue. Negative for chills and fever.  HENT: Negative for sore throat.   Eyes: Negative for blurred vision, double vision and pain.  Respiratory: Positive for cough and shortness of breath. Negative for hemoptysis and wheezing.   Cardiovascular: Negative for chest pain, palpitations, orthopnea and leg swelling.  Gastrointestinal: Negative for abdominal pain, constipation, diarrhea, heartburn, nausea and vomiting.  Genitourinary: Negative for dysuria and hematuria.  Musculoskeletal: Negative for back pain and joint pain.  Skin: Negative for rash.  Neurological: Positive for weakness. Negative for sensory change, speech change, focal weakness and headaches.  Endo/Heme/Allergies: Does not bruise/bleed easily.  Psychiatric/Behavioral: Negative for depression. The patient is not nervous/anxious.     DRUG ALLERGIES:  No Known Allergies  VITALS:  Blood pressure 136/72, pulse (!) 103, temperature (!) 97.5 F (36.4 C), temperature source Oral, resp. rate 20, height 5\' 9"  (1.753 m), weight 92.7 kg (204 lb 6.4 oz), SpO2 99 %.  PHYSICAL EXAMINATION:   Physical Exam  GENERAL:  72 y.o.-year-old patient lying in the bed .  Obese EYES: Pupils equal, round, reactive to light and accommodation. No scleral icterus. Extraocular muscles intact.  HEENT: Head atraumatic, normocephalic. Oropharynx and nasopharynx clear.  NECK:  Supple, no jugular venous distention. No thyroid enlargement, no tenderness.  LUNGS: Normal breath sounds bilaterally, no wheezing, rales, rhonchi. No use of accessory muscles of respiration.   CARDIOVASCULAR: S1, S2 normal. No murmurs, rubs, or gallops.  ABDOMEN: Soft, nontender, nondistended. Bowel sounds present. No organomegaly or mass.  EXTREMITIES: No cyanosis, clubbing or edema b/l.    NEUROLOGIC: Cranial nerves II through XII are intact. No focal Motor or sensory deficits b/l.   PSYCHIATRIC: The patient is alert and oriented x 3.  SKIN: No obvious rash, lesion, or ulcer.   LABORATORY PANEL:   CBC Recent Labs  Lab 03/26/17 0445  WBC 13.7*  HGB 12.5*  HCT 38.0*  PLT 258   ------------------------------------------------------------------------------------------------------------------ Chemistries  Recent Labs  Lab 03/26/17 0445  NA 135  K 3.7  CL 98*  CO2 27  GLUCOSE 215*  BUN 33*  CREATININE 0.85  CALCIUM 8.6*   ------------------------------------------------------------------------------------------------------------------  Cardiac Enzymes Recent Labs  Lab 03/25/17 0841  TROPONINI 0.05*   ------------------------------------------------------------------------------------------------------------------  RADIOLOGY:  No results found.   ASSESSMENT AND PLAN:   *Acute hypoxic respiratory failure secondary to COPD exacerbation and acute on chronic systolic CHF, started on BiPAP and transferred to stepdown from emergency room.  Now he is on the telemetry floor.  *Acute COPD exacerbation. -IV steroids - Scheduled Nebulizers - Inhalers -Wean O2 as tolerated  *Acute on chronic systolic congestive heart failure.  Ejection fraction 35-40%.   Needs ongoing diuresis with IV Lasix Counseled patient regarding fluid and salt restriction.  *Atrial fibrillation.  On amiodarone and Eliquis at home.  *CAD with CABG. Seems stable.  *DVT prophylaxis  On Eliquis at home  *Chronic urinary retention.  Patient does in and out catheterization at home.  Foley catheter placed here due to diuresis.   All the records are reviewed and case discussed with  Care Management/Social Worker Management plans  discussed with the patient, family and they are in agreement.  CODE STATUS: FULL CODE  DVT Prophylaxis: SCDs  TOTAL TIME TAKING CARE OF THIS PATIENT: 30 minutes.   POSSIBLE D/C IN 1-2 DAYS, DEPENDING ON CLINICAL CONDITION.  Leia Alf Auther Lyerly M.D on 03/27/2017 at 11:48 AM  Between 7am to 6pm - Pager - [phone]  After 6pm go to [email or web address] - password [email or web address] [facility]  Office  [phone]  CC: Primary care physician; Sofie Hartigan, MD  Note: This dictation was prepared with Dragon dictation along with smaller phrase technology. Any transcriptional errors that result from this process are unintentional.

## 2017-03-27 NOTE — Progress Notes (Signed)
Pt HR increased to 140's during breathing treatment with albuterol duoneb. MD Marcille Blanco made aware. Per RT suggestion, 0.63mg  Xopenex and 0.5mg  Atrovent ordered. Pt tolerated well with HR staying in the 95-110 range.

## 2017-03-27 NOTE — Progress Notes (Signed)
Diuresing well,iv steriods continue,oxygen weaned to 1 liter,wound care done,vital signs within normal limits.

## 2017-03-27 NOTE — Plan of Care (Signed)
  Progressing Education: Knowledge of General Education information will improve 03/27/2017 2052 - Progressing by Marylouise Stacks, RN Health Behavior/Discharge Planning: Ability to manage health-related needs will improve 03/27/2017 2052 - Progressing by Marylouise Stacks, RN Clinical Measurements: Ability to maintain clinical measurements within normal limits will improve 03/27/2017 2052 - Progressing by Marylouise Stacks, RN Diagnostic test results will improve 03/27/2017 2052 - Progressing by Marylouise Stacks, RN Respiratory complications will improve 03/27/2017 2052 - Progressing by Marylouise Stacks, RN Cardiovascular complication will be avoided 03/27/2017 2052 - Progressing by Marylouise Stacks, RN Activity: Risk for activity intolerance will decrease 03/27/2017 2052 - Progressing by Marylouise Stacks, RN Pain Managment: General experience of comfort will improve 03/27/2017 2052 - Progressing by Marylouise Stacks, RN

## 2017-03-28 LAB — BASIC METABOLIC PANEL
ANION GAP: 8 (ref 5–15)
BUN: 55 mg/dL — ABNORMAL HIGH (ref 6–20)
CHLORIDE: 98 mmol/L — AB (ref 101–111)
CO2: 30 mmol/L (ref 22–32)
Calcium: 8.6 mg/dL — ABNORMAL LOW (ref 8.9–10.3)
Creatinine, Ser: 1.03 mg/dL (ref 0.61–1.24)
GFR calc non Af Amer: >60 mL/min (ref >60)
Glucose, Bld: 190 mg/dL — ABNORMAL HIGH (ref 65–99)
Potassium: 4.4 mmol/L (ref 3.5–5.1)
SODIUM: 136 mmol/L (ref 135–145)

## 2017-03-28 LAB — MAGNESIUM: MAGNESIUM: 2.2 mg/dL (ref 1.7–2.4)

## 2017-03-28 MED ORDER — IPRATROPIUM BROMIDE 0.02 % IN SOLN: 0.5000 mg | Freq: Four times a day (QID) | RESPIRATORY_TRACT | 12 refills | Status: DC

## 2017-03-28 MED ORDER — FUROSEMIDE 40 MG PO TABS: 40.0000 mg | ORAL_TABLET | Freq: Every day | ORAL | 0 refills | Status: DC

## 2017-03-28 MED ORDER — PREDNISONE 10 MG (21) PO TBPK: ORAL_TABLET | ORAL | 0 refills | Status: DC

## 2017-03-28 MED ORDER — LEVALBUTEROL HCL 0.63 MG/3ML IN NEBU: 0.6300 mg | INHALATION_SOLUTION | RESPIRATORY_TRACT | 2 refills | Status: AC | PRN

## 2017-03-28 MED ORDER — PREDNISONE 50 MG PO TABS
50.0000 mg | ORAL_TABLET | Freq: Every day | ORAL | Status: DC
  Administered 2017-03-28: 50 mg via ORAL
  Filled 2017-03-28: qty 1

## 2017-03-28 NOTE — Progress Notes (Signed)
Patient alert and oriented, vss, no complaints of pain. Ambulated around nursing station with no oxygen.  02 saturations stayed above 94%.  F/u with CHF, Pulmonary, Cardiac.  No questions.  Escorted out of hospital via wheelchair by volunteers.

## 2017-03-28 NOTE — Care Management Important Message (Signed)
Important Message  Patient Details  Name: Donald Juarez MRN: [id number] Date of Birth: [birth year]   Medicare Important Message Given:  Yes    Katrina Stack, RN 03/28/2017, 3:56 PM

## 2017-03-28 NOTE — Progress Notes (Signed)
Attempted to review CHF Education with patient.  72 year old male with known hx of CAD, chronic systolic CHF with EF of 32%, COPD, HTN, not on home oxygen who was recently discharged on 03/23/2017.  Presented to ED on 03/25/2017.Patient returned due to complaint of worsening SOB.  When EMS arrived to patient's home patient's oxygen saturations were 60% on RA.  Patient was placed on CPAP.    Note:  Patient was non-complaint with his low sodium diet during previous admission, as he would ask his son to bring in hot dogs, french fries and chips.    Active problems this admission: 1. Acute hypoxic respiratory failure secondary to COPD exacerbation and acute on chronic CHF. 2. Acute COPD exacerbation 3. Acute on chronic systolic CHF. EF 35-40%  4. A-fib.  5. CAD - S/P CABG in January 2019.   7. Chronic urine retention - Patient performs self in and out catheterization.   Patient is a former smoker.    Patient has scales.  Initially educated patient on previous admission on 03/19/2017 with follow-up on 03/20/2017.  Today this RN rounded on patient to review with patient "The 5 Steps of Living Better with Heart Failure!"  Upon bringing the "Living Better with Heart Failure" booklet in the room, patient stated, "I have this booklet at home.  You gave that to me last time."  This RN attempted to review HF Education, but patient stopped me and said, "I will do the best I can with all of this, but I just have so much going on.  I am trying to move my wife from the facility that she is staying at now to [facility].  Since she has been at the facility where she is now, she is unable to stand and pivot.  She has really declined."  Allowed patient to ventilate and express his concerns about his wife.  After a time, when I informed the patient of his new patient appointment in the [facility] on April 03, 2017 at 12 noon, patient stated, "That may be a good time and that may not."  Patient will be following up  with his CT Surgeon at [facility] in the near future as well.  Per patient after CABG and STR patient was advised not to start driving until April 02, 2017.  Patient is anxious to be able to drive again in order to be able to go see his wife.  His wife upon moving to [place] will only be 10 minutes from their home.    Patient discharging today with [facility] RN to see patient in the home.    Roanna Epley, RN, BSN, [facility] Cardiovascular and Pulmonary Nurse Navigator

## 2017-03-28 NOTE — Discharge Instructions (Signed)
°-   Daily fluids < 2 liters. °- Low salt diet °- Check weight everyday and keep log. Take to your doctors appt. °- Take extra dose of lasix if you gain more than 3 pounds weight. ° ° °

## 2017-03-28 NOTE — Care Management (Signed)
Patient did not qualify for home oxygen.  He does not want to consider any other discharge plan but to return home.  He is very focused on having his wife moved to another skilled nursing facility while he recuperates. He does not give CM permission to contact his son who lives in [place] to determine if he can provided any additional assistance in the home.  [facility] is informed of discharge. and will resume services

## 2017-03-29 NOTE — Discharge Summary (Signed)
[facility] NAME: Donald Juarez    MR#:  [id number]  DATE OF BIRTH:  01-Jun-1945  DATE OF ADMISSION:  03/25/2017 ADMITTING PHYSICIAN: Hillary Bow, MD  DATE OF DISCHARGE: 03/28/2017  2:26 PM  PRIMARY CARE PHYSICIAN: Sofie Hartigan, MD   ADMISSION DIAGNOSIS:  Shortness of breath [R06.02] COPD exacerbation (HCC) [J44.1]  DISCHARGE DIAGNOSIS:  Active Problems:   COPD exacerbation ([place])   SECONDARY DIAGNOSIS:   Past Medical History:  Diagnosis Date  . Anginal pain ([place])    feels this as jaw pain.  takes imdur for this. rarely uses nitro.  [name] Anxiety   . Arthritis   . Asthma   . Bilateral claudication of lower limb ([place])   . Carotid stenosis    a. 07/2013 Carotid U/S: 100% RICA (pt prev reported h/o 55%), LICA 73-22%, bilat ECA 50%, normal vertebrals and subclavians bilat-->F/U needed in 01/2014.  . Cataract of left eye   . Cataract, right    a. 10/2010 s/p cataract surgery   . COPD (chronic obstructive pulmonary disease) ([place])   . Coronary artery disease    a. 06/1996 & 03/2005 Cath: non-obs dzs;  b. 07/2009 NSTEMI/Cath: LCX 99->PCI/DES extending into OM1;  c. 01/2013 Cath: patent LCX stent-->Med Rx.; d. cath 03/2014: RPAV dzs->Med rx; e. 10/2014 Cath/PCI: LM 30, LAD 20p/m, LCX 50ost/p, 10 ISR, 60d, OM1 min irregs, OM2 50, RCA 30ost, 47m, 60d, RPDA 70(2.5x8 Xience DES), RPAV 90small(PTCA); f. 10/2014 Relook Cath: RPAV 99(Med Rx), EF 45-50.  [name] Dysrhythmia    since ablation, flutter resolved.  . Essential hypertension   . Hyperkalemia   . Hyperlipidemia   . Myocardial infarction ([place]) 2015   had stent inserted and then had heart attack after. stent inserted due to blockage  . Paroxysmal atrial flutter ([place])    a. CHA2DS2VASc = 3 -> eliquis;  b. 07/2013 Echo: EF 50-55%, mildly dil LA.c. s/p RFCA 10/31/2013 by Dr Caryl Comes  . Prostate cancer ([place])   . Pulmonary nodule, right    a. 0.7cm RLL - stable by CT 06/2013.  [name] Rectal cancer ([place])     a. Status post colostomy  . Seizures ([place])    a. last 30 years ago  . Shingles    a. 09/2012.  . SSS (sick sinus syndrome) ([place])    a. 12/2009 S/P MDT Adapta ADDR01 DC PPM, ser # [id number] ([name]).  . Syncope and collapse      ADMITTING HISTORY  HISTORY OF PRESENT ILLNESS:  Donald Juarez  is a 72 y.o. male with a known history of CAD, chronic systolic CHF with ejection fraction 35%, COPD, hypertension not on home oxygen who was recently discharged on 03/23/2017 from the hospital after being treated for COPD returns due to worsening shortness of breath.  Patient mentions that when he left on Friday he was feeling well close to normal.  After he went home he slowly started getting his symptoms back.  Had clear sputum.  No chest pain.  When EMS saw the patient his saturations were 60% on room air and acutely short of breath and placed on CPAP.  He received IV steroids, nebulizers and magnesium for his shortness of breath. Presently he does not feel any significant improvement since arriving at the emergency room. Chest x-ray shows mild pulmonary edema no infiltrates.     HOSPITAL COURSE:   *Acute hypoxic respiratory failure secondary to COPD exacerbation and acute on chronic systolic CHF, started on  BiPAP and transferred to stepdown from emergency room.    Then transition to telemetry floor.  By the day of discharge she is off oxygen.  Saturations were 96% on room air while ambulating.  *Acute COPD exacerbation. Treated with IV steroids, nebulizers.  Change to prednisone taper at discharge.  His albuterol nebulizer has been changed to leave albuterol.  He also uses Brovana and budesonide.  *Acute on chronic systolic congestive heart failure. Ejection fraction 35-40%.  Had decompensation of congestive heart failure due to dietary indiscretion.  He does not follow fluid and salt restriction.  Counseled on multiple occasions during the hospital stay. Patient diuresed well and is -6 L  by the day of discharge.  *Atrial fibrillation.  On amiodarone and Eliquis at home.  *CAD with CABG. Seems stable.   *Chronic urinary retention.  Patient does in and out catheterization at home.   Foley catheter placed in the hospital for monitoring of input and output.  Patient discharged home in stable condition with home health services.    CONSULTS OBTAINED:    DRUG ALLERGIES:  No Known Allergies  DISCHARGE MEDICATIONS:   Allergies as of 03/28/2017   No Known Allergies     Medication List    STOP taking these medications   albuterol (2.5 MG/3ML) 0.083% nebulizer solution Commonly known as:  PROVENTIL   albuterol 108 (90 Base) MCG/ACT inhaler Commonly known as:  PROAIR HFA   ciprofloxacin 500 MG tablet Commonly known as:  CIPRO     TAKE these medications   amiodarone 200 MG tablet Commonly known as:  PACERONE Take 200 mg by mouth daily.   arformoterol 15 MCG/2ML Nebu Commonly known as:  BROVANA Take 2 mLs (15 mcg total) by nebulization 2 (two) times daily. Take 2 mLs (15 mcg total) by nebulization 2 (two) times daily. DX code 493.20 Every morning and 1800   aspirin EC 81 MG tablet Take 81 mg by mouth daily.   atorvastatin 80 MG tablet Commonly known as:  LIPITOR Take 1 tablet (80 mg total) by mouth daily.   budesonide 0.5 MG/2ML nebulizer solution Commonly known as:  PULMICORT Take 2 mLs (0.5 mg total) by nebulization 2 (two) times daily. DX code 493.20 Every morning and 1800   ELIQUIS 5 MG Tabs tablet Generic drug:  apixaban Take 5 mg by mouth 2 (two) times daily.   furosemide 40 MG tablet Commonly known as:  LASIX Take 1 tablet (40 mg total) by mouth daily. What changed:    medication strength  how much to take   guaiFENesin 600 MG 12 hr tablet Commonly known as:  MUCINEX Take 1 tablet (600 mg total) by mouth 2 (two) times daily.   ipratropium 0.02 % nebulizer solution Commonly known as:  ATROVENT Take 2.5 mLs (0.5 mg total) by  nebulization every 6 (six) hours.   isosorbide mononitrate 30 MG 24 hr tablet Commonly known as:  IMDUR Take 1 tablet (30 mg total) by mouth daily.   levalbuterol 0.63 MG/3ML nebulizer solution Commonly known as:  XOPENEX Take 3 mLs (0.63 mg total) by nebulization every 4 (four) hours as needed for wheezing or shortness of breath.   meclizine 12.5 MG tablet Commonly known as:  ANTIVERT Take 1-2 tablets by mouth 3 (three) times daily as needed.   metoprolol tartrate 25 MG tablet Commonly known as:  LOPRESSOR Take 0.5 tablets (12.5 mg total) by mouth 2 (two) times daily.   nitroGLYCERIN 0.4 MG SL tablet Commonly known as:  NITROSTAT  Place 1 tablet (0.4 mg total) under the tongue every 5 (five) minutes as needed for chest pain.   omeprazole 40 MG capsule Commonly known as:  PRILOSEC Take 40 mg by mouth daily.   PARoxetine 30 MG tablet Commonly known as:  PAXIL Take 30 mg by mouth daily.   predniSONE 10 MG (21) Tbpk tablet Commonly known as:  STERAPRED UNI-PAK 21 TAB 6 tabs day 1 and taper 1 tab a day - 6 days What changed:  additional instructions   Tiotropium Bromide Monohydrate 1.25 MCG/ACT Aers Commonly known as:  SPIRIVA RESPIMAT Inhale 2 puffs into the lungs 2 (two) times daily.   traMADol 50 MG tablet Commonly known as:  ULTRAM Take 1-2 tablets (50-100 mg total) by mouth every 4 (four) hours as needed for moderate pain. What changed:    how much to take  when to take this   Vitamin D 2000 units tablet Take 2,000 Units by mouth daily.       Today   VITAL SIGNS:  Blood pressure (!) 124/93, pulse 65, temperature 98.7 F (37.1 C), temperature source Oral, resp. rate 16, height 5\' 9"  (1.753 m), weight 91.2 kg (201 lb), SpO2 95 %.  I/O:  No intake or output data in the 24 hours ending 03/29/17 1513  PHYSICAL EXAMINATION:  Physical Exam  GENERAL:  72 y.o.-year-old patient lying in the bed with no acute distress.  LUNGS: Normal breath sounds bilaterally,  no wheezing, rales,rhonchi or crepitation. No use of accessory muscles of respiration.  CARDIOVASCULAR: S1, S2 normal. No murmurs, rubs, or gallops.  ABDOMEN: Soft, non-tender, non-distended. Bowel sounds present. No organomegaly or mass.  NEUROLOGIC: Moves all 4 extremities. PSYCHIATRIC: The patient is alert and oriented x 3.  SKIN: No obvious rash, lesion, or ulcer.   DATA REVIEW:   CBC Recent Labs  Lab 03/26/17 0445  WBC 13.7*  HGB 12.5*  HCT 38.0*  PLT 258    Chemistries  Recent Labs  Lab 03/28/17 0416  NA 136  K 4.4  CL 98*  CO2 30  GLUCOSE 190*  BUN 55*  CREATININE 1.03  CALCIUM 8.6*  MG 2.2    Cardiac Enzymes Recent Labs  Lab 03/25/17 0841  TROPONINI 0.05*    Microbiology Results  Results for orders placed or performed during the hospital encounter of 03/25/17  MRSA PCR Screening     Status: None   Collection Time: 03/25/17  4:45 PM  Result Value Ref Range Status   MRSA by PCR NEGATIVE NEGATIVE Final    Comment:        The GeneXpert MRSA Assay (FDA approved for NASAL specimens only), is one component of a comprehensive MRSA colonization surveillance program. It is not intended to diagnose MRSA infection nor to guide or monitor treatment for MRSA infections. Performed at [facility], [address] [place], [place] [address]     RADIOLOGY:  No results found.  Follow up with PCP in 1 week.  Management plans discussed with the patient, family and they are in agreement.  CODE STATUS:  Code Status History    Date Active Date Inactive Code Status Order ID Comments User Context   03/25/2017 08:23 03/28/2017 17:32 Full Code [id number]  Hillary Bow, MD ED   03/19/2017 08:29 03/23/2017 15:02 Full Code [id number]  Epifanio Lesches, MD ED   03/12/2017 11:35 03/14/2017 16:53 Full Code [id number]  Dustin Flock, MD ED   02/07/2017 19:27 02/10/2017 08:25 Full Code [id number]  Nicholes Mango, MD Inpatient  01/14/2016 11:22 01/17/2016 16:59 Full  Code [id number]  [name], MD Inpatient   11/03/2014 16:40 11/04/2014 13:57 Full Code [id number]  [name], MD Inpatient   11/02/2014 13:33 11/03/2014 16:40 Full Code [id number]  [name], MD Inpatient   10/31/2013 12:08 11/01/2013 01:02 Full Code [id number]  [name], MD Inpatient      TOTAL TIME TAKING CARE OF THIS PATIENT ON DAY OF DISCHARGE: more than 30 minutes.   Leia Alf Dinesha Twiggs M.D on 03/29/2017 at 3:13 PM  Between 7am to 6pm - Pager - [phone]  After 6pm go to [email or web address] - password [email or web address] [facility]  Office  [phone]  CC: Primary care physician; Sofie Hartigan, MD  Note: This dictation was prepared with Dragon dictation along with smaller phrase technology. Any transcriptional errors that result from this process are unintentional.

## 2017-04-02 ENCOUNTER — Telehealth: Payer: Self-pay

## 2017-04-02 NOTE — Progress Notes (Deleted)
   Patient ID: Donald Juarez, male    DOB: 06/21/1945, 72 y.o.   MRN: [id number]  HPI  Donald Juarez is a 72 y/o male with a history of  Echo report from 03/13/17 showed an EF of 35-40% along with mild Donald. Cardiac catheterization done 02/08/17 which showed severe 3 vessel CAD and significant stenosis of LAD. Consider CABG.   Admitted 03/25/17 due to COPD/HF exacerbation. Initially needed bipap and transitioned to room air. Given IV steroids and nebulizers and discharged with oral steroids. Diuresed ~ 6 L. Discharged after 3 days. Admitted 03/19/17 due to COPD exacerbation. Initially given IV antibiotics and bronchodilators. Given IV diuretics. Discharged after 4 days. Admitted 03/12/17 due to heart failure exacerbation. Cardiology consult obtained. Initially needed IV diuretics and then transitioned to oral diuretics. Received IV steroids and then discharged with oral steroids. Had pacemaker generator change at [facility] January 2019. Discharged after 2 days.   He presents today for his initial visit with a chief complaint of   Review of Systems    Physical Exam  Assessment & Plan:  1: Chronic heart failure with reduced ejection fraction- - NYHA class - BNP 03/19/17 was 557.0  2: HTN- - saw PCP Donald Juarez) 01/25/17 - BMP from 03/28/17 reviewed and showed sodium 136, potassium 4.4 and GFR >60  3: CABG-  - saw cardiology Donald Juarez) 11/16/16 - saw thoracic surgery 03/07/17

## 2017-04-02 NOTE — Telephone Encounter (Signed)
EMMI Follow-up: Was noted on the report patient did not know who to call if there were changes in his condition.  He stated he had blisters in his mouth and the rinse he had was not helping. Side of lips are cracked and he can't chew.  Suggested he call his PCP for Rx for a different rinse and to have the one he is using close by so he could tell them what he is currently using.   He is unable to make his follow-up appointment with [facility] on Tuesday as he relies on son in [place] for transportation to his appointments and unable to get their alone. His son has arranged for PCS to come at 10 am and he is waiting a call from [facility] agency to see what time they will be out tomorrow.  Gave him the number to the clinic and asked that he reschedule his appointment when the son is available.   The plan is for the son to take him to his follow-up appointments on Wednesday, 3/6. No other concerns at this time.

## 2017-04-02 NOTE — Progress Notes (Signed)
Subjective:    Patient ID: Donald Juarez, male    DOB: 07/22/1945, 72 y.o.   MRN: [id number]  Synopsis: Mr. Courville first saw the [facility] in 2014 for COPD after smoking 1 PPD for 30 years and quitting in 2010.  He has frequent exacerbations.    03/2013 Full PFT> Ratio 53% FEV1 1.89 (65% pred, 13% change), TLC 5.83 L (93% pred), DLCO 16.6 (69% pred) Quit tobacco 2010 Recent Cardiac Stent 11/16  CC: chronic SOB, follow up COPD +cough and chest congestion  HPI Patient is a 72 year old male with a history of severe COPD.  He normally sees Dr. Mortimer Fries, but follows up today as a hospital discharge follow-up.  He thus far has had 5 hospital admissions in the first 2 months of this year for various cardio/respiratory issues including COPD exacerbation, unstable angina, pulmonary edema with congestive heart failure.  He has been asked to continue chronic azithromycin, Brovana, Incruz, albuterol nebulizers. Since his last visit here he has had a 3V CABG in January of 2019, he was discharged to rehab.  He was most recently admitted to the hospital from 03/25/17 to 2/27 for acute COPD exacerbation as well as systolic congestive heart failure. He was advised to stop using albuterol, he was placed on amio, nebulized brovana, pulmicort, xopenex nebs, spiriva,  prednisone taper.  He was taken off azithro, likely due to concomitant amio.  Today he appears to have considerable dyspnea, he has 2 pocket fulls of inhalers today including multiple doses of symbicort and other inhalers. He has considerable confusion about his medications and there use He is not smoking, he has no pets. No wood burning stove.  Denies reflux, he has a lot of sinus drainage.   Patient was ambulated in the office today on RA, his oxygen saturation dropped to 81% with oxygen. He was started on oxygen at 2L with improvement to 92%.    Current Outpatient Medications:  .  amiodarone (PACERONE) 200 MG tablet, Take 200  mg by mouth daily., Disp: , Rfl:  .  apixaban (ELIQUIS) 5 MG TABS tablet, Take 5 mg by mouth 2 (two) times daily., Disp: , Rfl:  .  arformoterol (BROVANA) 15 MCG/2ML NEBU, Take 2 mLs (15 mcg total) by nebulization 2 (two) times daily. Take 2 mLs (15 mcg total) by nebulization 2 (two) times daily. DX code 493.20 Every morning and 1800, Disp: 360 mL, Rfl: 3 .  aspirin EC 81 MG tablet, Take 81 mg by mouth daily., Disp: , Rfl:  .  atorvastatin (LIPITOR) 80 MG tablet, Take 1 tablet (80 mg total) by mouth daily., Disp: 90 tablet, Rfl: 3 .  budesonide (PULMICORT) 0.5 MG/2ML nebulizer solution, Take 2 mLs (0.5 mg total) by nebulization 2 (two) times daily. DX code 493.20 Every morning and 1800, Disp: 360 mL, Rfl: 3 .  budesonide-formoterol (SYMBICORT) 160-4.5 MCG/ACT inhaler, Inhale 2 puffs into the lungs 2 (two) times daily., Disp: , Rfl:  .  Cholecalciferol (VITAMIN D) 2000 UNITS tablet, Take 2,000 Units by mouth daily., Disp: , Rfl:  .  furosemide (LASIX) 40 MG tablet, Take 1 tablet (40 mg total) by mouth daily., Disp: 30 tablet, Rfl: 0 .  guaiFENesin (MUCINEX) 600 MG 12 hr tablet, Take 1 tablet (600 mg total) by mouth 2 (two) times daily., Disp: 20 tablet, Rfl: 0 .  ipratropium (ATROVENT) 0.02 % nebulizer solution, Take 2.5 mLs (0.5 mg total) by nebulization every 6 (six) hours., Disp: 75 mL, Rfl: 12 .  ipratropium-albuterol (DUONEB) 0.5-2.5 (3) MG/3ML SOLN, Take 3 mLs by nebulization every 6 (six) hours as needed., Disp: , Rfl:  .  isosorbide mononitrate (IMDUR) 30 MG 24 hr tablet, Take 1 tablet (30 mg total) by mouth daily., Disp: 30 tablet, Rfl: 11 .  levalbuterol (XOPENEX) 0.63 MG/3ML nebulizer solution, Take 3 mLs (0.63 mg total) by nebulization every 4 (four) hours as needed for wheezing or shortness of breath., Disp: 3 mL, Rfl: 2 .  meclizine (ANTIVERT) 12.5 MG tablet, Take 1-2 tablets by mouth 3 (three) times daily as needed., Disp: , Rfl:  .  metoprolol tartrate (LOPRESSOR) 25 MG tablet, Take 0.5  tablets (12.5 mg total) by mouth 2 (two) times daily., Disp: 60 tablet, Rfl: 2 .  nitroGLYCERIN (NITROSTAT) 0.4 MG SL tablet, Place 1 tablet (0.4 mg total) under the tongue every 5 (five) minutes as needed for chest pain., Disp: 25 tablet, Rfl: 3 .  omeprazole (PRILOSEC) 40 MG capsule, Take 40 mg by mouth daily., Disp: , Rfl:  .  PARoxetine (PAXIL) 30 MG tablet, Take 30 mg by mouth daily. , Disp: , Rfl:  .  Tiotropium Bromide Monohydrate (SPIRIVA RESPIMAT) 1.25 MCG/ACT AERS, Inhale 2 puffs into the lungs 2 (two) times daily., Disp: 12 g, Rfl: 3 .  traMADol (ULTRAM) 50 MG tablet, Take 1-2 tablets (50-100 mg total) by mouth every 4 (four) hours as needed for moderate pain. (Patient taking differently: Take 50 mg by mouth every 6 (six) hours as needed for moderate pain. ), Disp: 40 tablet, Rfl: 0 .  ipratropium (ATROVENT) 0.06 % nasal spray, Place 2 sprays into the nose 4 (four) times daily., Disp: 15 mL, Rfl: 1  Review of Systems  Constitutional: Positive for fatigue. Negative for chills and fever.  HENT: Positive for congestion, postnasal drip and rhinorrhea.   Respiratory: Positive for cough, shortness of breath and wheezing. Negative for chest tightness.   Cardiovascular: Negative for chest pain, palpitations and leg swelling.  Genitourinary: Positive for difficulty urinating and frequency.  Neurological: Negative for dizziness.  All other systems reviewed and are negative.      Objective:   Physical Exam  Constitutional: He is oriented to person, place, and time. He appears well-developed and well-nourished. No distress.  HENT:  Mouth/Throat: No oropharyngeal exudate.  Eyes: EOM are normal.  Neck: Neck supple.  Cardiovascular: Normal rate, regular rhythm and normal heart sounds.  No murmur heard. Pulmonary/Chest: No stridor. No respiratory distress. He has wheezes. He has rales.  Musculoskeletal: Normal range of motion. He exhibits no edema.  Neurological: He is alert and oriented to  person, place, and time.  Skin: Skin is warm. He is not diaphoretic.  Psychiatric: He has a normal mood and affect.   BP 112/72 (BP Location: Left Arm, Cuff Size: Normal)   Pulse 79   Resp 16   Ht 5\' 9"  (1.753 m)   Wt 200 lb (90.7 kg)   SpO2 96%   BMI 29.53 kg/m    03/12/2014 left heart catheterization reviewed> moderately elevated LVEDP, patent stents, occlusion noted in a bifurcation in the RCA territory, LVEF 65% 05/10/2014 chest x-ray images personally reviewed there are no pulmonary infiltrates but there is emphysema bilaterally pacemaker in place    Assessment & Plan:  72 yo white male with moderate  COPD with chronic SOB COPD, GOLD GRADE D with acute COPD exacerbation in setting of deconditioned state. Patient's regimen seems to be at maximum level of therapy. Patient has chronic UTi and currently on  oral abx with Levaquin   1.Shortness of breath and dyspnea on exertion with wheezing related to COPD. Acute on chronic hypoxic respiratory failure with AECOPD, acute bronchitis.  -continue inhaled medications as prescribed from recent hospitalization.  Use nebulized brovana and pulmicort twice daily.  Stop symbicort, stop proair, stop ventolin.  Use nebulized xopenex (levalbuterol) as needed.  -Azithromycin  was post discharge stopped due to concomitant use of amiodarone. -Patient remains at high risk of decompensation, rehospitalization, death. - Patient was noted to have significant ambulatory hypoxia, he was started on 2 L of oxygen with ambulation and at rest.  Is also been sent for a overnight oximetry to see if he qualifies for nighttime oxygen.  -Given his recurrent infections we discussed possible bronchoscopy to rule out resistant infection.  Given his continued decline in multiple exacerbations, he would be at higher risk of complications, therefore I will check a sputum culture to look for any evidence of persistent bacterial infection.  This is negative and his status  continues to decline we can always readdress the possibility of bronchoscopy again in the future. Over 40 minutes spent with the patient today in counseling, discussing medications.  2.thrush. -Given prescription for Magic mouthwash.  3.Deconditioned state -Recommend increased daily activity and exercise  Chronic rhinitis, not improved with Flonase. -Given prescription for nasal ipratropium to be used 3 times daily.  Excessive daytime sleepiness. - Epworth score is 11 today we will send for sleep study given symptoms and signs of obstructive sleep apnea.   Orders Placed This Encounter  Procedures  . AMB REFERRAL FOR DME  . Pulse oximetry, overnight  . Split night study   Meds ordered this encounter  Medications  . ipratropium (ATROVENT) 0.06 % nasal spray    Sig: Place 2 sprays into the nose 4 (four) times daily.    Dispense:  15 mL    Refill:  1  . magic mouthwash w/lidocaine SOLN    Sig: Take 5 mLs by mouth 4 (four) times daily for 7 days. Rinse mouth for 30 seconds before eating.    Dispense:  100 mL    Refill:  0   Return in about 3 weeks (around 04/25/2017).

## 2017-04-03 ENCOUNTER — Ambulatory Visit: Payer: Medicare Other | Admitting: Family

## 2017-04-03 ENCOUNTER — Telehealth: Payer: Self-pay | Admitting: Family

## 2017-04-03 NOTE — Telephone Encounter (Signed)
Patient did not show for his [facility] appointment on 04/03/17. Will attempt to reschedule.

## 2017-04-04 ENCOUNTER — Ambulatory Visit: Payer: Medicare Other | Admitting: Internal Medicine

## 2017-04-04 ENCOUNTER — Encounter: Payer: Self-pay | Admitting: Internal Medicine

## 2017-04-04 VITALS — BP 112/72 | HR 79 | Resp 16 | Ht 69.0 in | Wt 200.0 lb

## 2017-04-04 DIAGNOSIS — J9611 Chronic respiratory failure with hypoxia: Secondary | ICD-10-CM | POA: Diagnosis not present

## 2017-04-04 DIAGNOSIS — G4719 Other hypersomnia: Secondary | ICD-10-CM

## 2017-04-04 MED ORDER — MAGIC MOUTHWASH W/LIDOCAINE: 5.0000 mL | Freq: Four times a day (QID) | ORAL | 0 refills | Status: AC

## 2017-04-04 MED ORDER — IPRATROPIUM BROMIDE 0.06 % NA SOLN: 2.0000 | Freq: Four times a day (QID) | NASAL | 1 refills | Status: AC

## 2017-04-04 NOTE — Patient Instructions (Addendum)
Use nebulized brovana and pulmicort twice daily.  Use nebulized xopenex (levalbuterol) as needed.  Start ipratropium nasal spray 2 sprays in each nostril 3 times daily.  Start magic mouthwash; Rinse mouth for 30 seconds before eating.   Stop symbicort, stop proair, stop ventolin.  Will send for sleep study.

## 2017-04-06 NOTE — Clinical Social Work Note (Signed)
Telephone Encounter 04-06-17  CSW called patient to follow up on hospital stay, patient reported that he was feeling sad.  CSW spoke to patient and he expressed that he misses his wife, she is at [facility] in [place] as a long term care patient.  Patient expressed that they have been married for 54 years and he had help taking care of her for many years.  Patient expressed that he is receiving home health which has been good, and he is hopeful he can see his wife next week some time.  Patient stated that he is hoping to be able to get her somewhere closer as a long term resident.  Patient was appreciative of phone call that was made.  Patient states he is just taking things one day at at time.  Jones Broom. Norval Morton, MSW, [place]  04/06/2017 12:35 PM

## 2017-04-17 ENCOUNTER — Other Ambulatory Visit: Payer: Self-pay

## 2017-04-17 ENCOUNTER — Inpatient Hospital Stay
Admission: EM | Admit: 2017-04-17 | Discharge: 2017-04-22 | DRG: 291 | Disposition: A | Discharge status: Skilled Nursing Facility | Payer: Medicare Other | Attending: Internal Medicine | Admitting: Internal Medicine

## 2017-04-17 ENCOUNTER — Emergency Department: Payer: Medicare Other

## 2017-04-17 ENCOUNTER — Encounter: Payer: Self-pay | Admitting: Emergency Medicine

## 2017-04-17 DIAGNOSIS — Z955 Presence of coronary angioplasty implant and graft: Secondary | ICD-10-CM

## 2017-04-17 DIAGNOSIS — Z9842 Cataract extraction status, left eye: Secondary | ICD-10-CM | POA: Diagnosis not present

## 2017-04-17 DIAGNOSIS — J9602 Acute respiratory failure with hypercapnia: Secondary | ICD-10-CM | POA: Diagnosis present

## 2017-04-17 DIAGNOSIS — J810 Acute pulmonary edema: Secondary | ICD-10-CM | POA: Diagnosis not present

## 2017-04-17 DIAGNOSIS — Z85048 Personal history of other malignant neoplasm of rectum, rectosigmoid junction, and anus: Secondary | ICD-10-CM

## 2017-04-17 DIAGNOSIS — R062 Wheezing: Secondary | ICD-10-CM | POA: Diagnosis not present

## 2017-04-17 DIAGNOSIS — Z961 Presence of intraocular lens: Secondary | ICD-10-CM | POA: Diagnosis present

## 2017-04-17 DIAGNOSIS — R339 Retention of urine, unspecified: Secondary | ICD-10-CM | POA: Diagnosis present

## 2017-04-17 DIAGNOSIS — I252 Old myocardial infarction: Secondary | ICD-10-CM | POA: Diagnosis not present

## 2017-04-17 DIAGNOSIS — Z7982 Long term (current) use of aspirin: Secondary | ICD-10-CM

## 2017-04-17 DIAGNOSIS — J449 Chronic obstructive pulmonary disease, unspecified: Secondary | ICD-10-CM

## 2017-04-17 DIAGNOSIS — G40909 Epilepsy, unspecified, not intractable, without status epilepticus: Secondary | ICD-10-CM | POA: Diagnosis present

## 2017-04-17 DIAGNOSIS — J9601 Acute respiratory failure with hypoxia: Secondary | ICD-10-CM | POA: Diagnosis not present

## 2017-04-17 DIAGNOSIS — I5023 Acute on chronic systolic (congestive) heart failure: Secondary | ICD-10-CM | POA: Diagnosis present

## 2017-04-17 DIAGNOSIS — J9620 Acute and chronic respiratory failure, unspecified whether with hypoxia or hypercapnia: Secondary | ICD-10-CM | POA: Diagnosis present

## 2017-04-17 DIAGNOSIS — F22 Delusional disorders: Secondary | ICD-10-CM | POA: Diagnosis present

## 2017-04-17 DIAGNOSIS — Z96651 Presence of right artificial knee joint: Secondary | ICD-10-CM | POA: Diagnosis present

## 2017-04-17 DIAGNOSIS — Z466 Encounter for fitting and adjustment of urinary device: Secondary | ICD-10-CM | POA: Diagnosis not present

## 2017-04-17 DIAGNOSIS — Z79899 Other long term (current) drug therapy: Secondary | ICD-10-CM

## 2017-04-17 DIAGNOSIS — Z933 Colostomy status: Secondary | ICD-10-CM

## 2017-04-17 DIAGNOSIS — I2510 Atherosclerotic heart disease of native coronary artery without angina pectoris: Secondary | ICD-10-CM | POA: Diagnosis present

## 2017-04-17 DIAGNOSIS — F329 Major depressive disorder, single episode, unspecified: Secondary | ICD-10-CM | POA: Diagnosis present

## 2017-04-17 DIAGNOSIS — K219 Gastro-esophageal reflux disease without esophagitis: Secondary | ICD-10-CM | POA: Diagnosis present

## 2017-04-17 DIAGNOSIS — N179 Acute kidney failure, unspecified: Secondary | ICD-10-CM | POA: Diagnosis present

## 2017-04-17 DIAGNOSIS — Z9841 Cataract extraction status, right eye: Secondary | ICD-10-CM

## 2017-04-17 DIAGNOSIS — I480 Paroxysmal atrial fibrillation: Secondary | ICD-10-CM | POA: Diagnosis present

## 2017-04-17 DIAGNOSIS — J441 Chronic obstructive pulmonary disease with (acute) exacerbation: Secondary | ICD-10-CM

## 2017-04-17 DIAGNOSIS — Z888 Allergy status to other drugs, medicaments and biological substances status: Secondary | ICD-10-CM

## 2017-04-17 DIAGNOSIS — Z7901 Long term (current) use of anticoagulants: Secondary | ICD-10-CM | POA: Diagnosis not present

## 2017-04-17 DIAGNOSIS — Z87891 Personal history of nicotine dependence: Secondary | ICD-10-CM

## 2017-04-17 DIAGNOSIS — E860 Dehydration: Secondary | ICD-10-CM | POA: Diagnosis present

## 2017-04-17 DIAGNOSIS — R319 Hematuria, unspecified: Secondary | ICD-10-CM | POA: Diagnosis not present

## 2017-04-17 DIAGNOSIS — I248 Other forms of acute ischemic heart disease: Secondary | ICD-10-CM | POA: Diagnosis present

## 2017-04-17 DIAGNOSIS — I130 Hypertensive heart and chronic kidney disease with heart failure and stage 1 through stage 4 chronic kidney disease, or unspecified chronic kidney disease: Principal | ICD-10-CM | POA: Diagnosis present

## 2017-04-17 DIAGNOSIS — E785 Hyperlipidemia, unspecified: Secondary | ICD-10-CM | POA: Diagnosis present

## 2017-04-17 DIAGNOSIS — Z950 Presence of cardiac pacemaker: Secondary | ICD-10-CM | POA: Diagnosis not present

## 2017-04-17 DIAGNOSIS — R338 Other retention of urine: Secondary | ICD-10-CM | POA: Diagnosis not present

## 2017-04-17 DIAGNOSIS — Z951 Presence of aortocoronary bypass graft: Secondary | ICD-10-CM

## 2017-04-17 DIAGNOSIS — I429 Cardiomyopathy, unspecified: Secondary | ICD-10-CM | POA: Diagnosis present

## 2017-04-17 DIAGNOSIS — Z8546 Personal history of malignant neoplasm of prostate: Secondary | ICD-10-CM | POA: Diagnosis not present

## 2017-04-17 DIAGNOSIS — I509 Heart failure, unspecified: Secondary | ICD-10-CM

## 2017-04-17 DIAGNOSIS — Z7951 Long term (current) use of inhaled steroids: Secondary | ICD-10-CM

## 2017-04-17 LAB — BLOOD GAS, VENOUS
ACID-BASE DEFICIT: 2.8 mmol/L — AB (ref 0.0–2.0)
Bicarbonate: 28.3 mmol/L — ABNORMAL HIGH (ref 20.0–28.0)
DELIVERY SYSTEMS: POSITIVE
FIO2: 0.6
O2 Saturation: 76.5 %
PATIENT TEMPERATURE: 37
pCO2, Ven: 85 mmHg (ref 44.0–60.0)
pH, Ven: 7.13 — CL (ref 7.250–7.430)
pO2, Ven: 55 mmHg — ABNORMAL HIGH (ref 32.0–45.0)

## 2017-04-17 LAB — COMPREHENSIVE METABOLIC PANEL
ALK PHOS: 121 U/L (ref 38–126)
ALT: 42 U/L (ref 17–63)
ANION GAP: 11 (ref 5–15)
AST: 73 U/L — ABNORMAL HIGH (ref 15–41)
Albumin: 3.1 g/dL — ABNORMAL LOW (ref 3.5–5.0)
BILIRUBIN TOTAL: 0.5 mg/dL (ref 0.3–1.2)
BUN: 19 mg/dL (ref 6–20)
CALCIUM: 8.2 mg/dL — AB (ref 8.9–10.3)
CO2: 24 mmol/L (ref 22–32)
Chloride: 103 mmol/L (ref 101–111)
Creatinine, Ser: 1.27 mg/dL — ABNORMAL HIGH (ref 0.61–1.24)
GFR, EST NON AFRICAN AMERICAN: 55 mL/min — AB (ref >60)
Glucose, Bld: 227 mg/dL — ABNORMAL HIGH (ref 65–99)
Potassium: 4.3 mmol/L (ref 3.5–5.1)
SODIUM: 138 mmol/L (ref 135–145)
TOTAL PROTEIN: 7 g/dL (ref 6.5–8.1)

## 2017-04-17 LAB — CBC WITH DIFFERENTIAL/PLATELET
BASOS PCT: 1 %
Basophils Absolute: 0.1 10*3/uL (ref 0–0.1)
EOS ABS: 0.3 10*3/uL (ref 0–0.7)
Eosinophils Relative: 3 %
HCT: 36.4 % — ABNORMAL LOW (ref 40.0–52.0)
Hemoglobin: 11.8 g/dL — ABNORMAL LOW (ref 13.0–18.0)
Lymphocytes Relative: 14 %
Lymphs Abs: 1.1 10*3/uL (ref 1.0–3.6)
MCH: 29.2 pg (ref 26.0–34.0)
MCHC: 32.3 g/dL (ref 32.0–36.0)
MCV: 90.2 fL (ref 80.0–100.0)
Monocytes Absolute: 0.8 10*3/uL (ref 0.2–1.0)
Monocytes Relative: 10 %
NEUTROS PCT: 72 %
Neutro Abs: 5.7 10*3/uL (ref 1.4–6.5)
PLATELETS: 354 10*3/uL (ref 150–440)
RBC: 4.03 MIL/uL — AB (ref 4.40–5.90)
RDW: 16.2 % — ABNORMAL HIGH (ref 11.5–14.5)
WBC: 7.9 10*3/uL (ref 3.8–10.6)

## 2017-04-17 LAB — TROPONIN I
TROPONIN I: 0.04 ng/mL — AB (ref <0.03)
TROPONIN I: 0.06 ng/mL — AB (ref <0.03)
Troponin I: 0.07 ng/mL (ref <0.03)

## 2017-04-17 LAB — GLUCOSE, CAPILLARY: GLUCOSE-CAPILLARY: 131 mg/dL — AB (ref 65–99)

## 2017-04-17 LAB — BRAIN NATRIURETIC PEPTIDE: B Natriuretic Peptide: 1575 pg/mL — ABNORMAL HIGH (ref 0.0–100.0)

## 2017-04-17 MED ORDER — METHYLPREDNISOLONE SODIUM SUCC 125 MG IJ SOLR
125.0000 mg | Freq: Once | INTRAMUSCULAR | Status: AC
  Administered 2017-04-17: 125 mg via INTRAVENOUS
  Filled 2017-04-17: qty 2

## 2017-04-17 MED ORDER — MORPHINE SULFATE (PF) 2 MG/ML IV SOLN
2.0000 mg | INTRAVENOUS | Status: DC | PRN
  Administered 2017-04-19: 2 mg via INTRAVENOUS
  Filled 2017-04-17: qty 1

## 2017-04-17 MED ORDER — IPRATROPIUM BROMIDE 0.02 % IN SOLN
0.5000 mg | Freq: Four times a day (QID) | RESPIRATORY_TRACT | Status: DC
  Administered 2017-04-17 – 2017-04-22 (×17): 0.5 mg via RESPIRATORY_TRACT
  Filled 2017-04-17 (×18): qty 2.5

## 2017-04-17 MED ORDER — FUROSEMIDE 10 MG/ML IJ SOLN
40.0000 mg | Freq: Two times a day (BID) | INTRAMUSCULAR | Status: DC
  Administered 2017-04-18 – 2017-04-20 (×5): 40 mg via INTRAVENOUS
  Filled 2017-04-17 (×5): qty 4

## 2017-04-17 MED ORDER — TRAMADOL HCL 50 MG PO TABS
50.0000 mg | ORAL_TABLET | ORAL | Status: DC | PRN
  Administered 2017-04-17 – 2017-04-19 (×2): 50 mg via ORAL
  Administered 2017-04-21: 100 mg via ORAL
  Filled 2017-04-17 (×4): qty 1
  Filled 2017-04-17: qty 2

## 2017-04-17 MED ORDER — ISOSORBIDE MONONITRATE ER 30 MG PO TB24
30.0000 mg | ORAL_TABLET | Freq: Every day | ORAL | Status: DC
  Administered 2017-04-18 – 2017-04-22 (×3): 30 mg via ORAL
  Filled 2017-04-17 (×5): qty 1

## 2017-04-17 MED ORDER — ACETAMINOPHEN 325 MG PO TABS
650.0000 mg | ORAL_TABLET | Freq: Four times a day (QID) | ORAL | Status: DC | PRN
  Administered 2017-04-21: 650 mg via ORAL
  Filled 2017-04-17: qty 2

## 2017-04-17 MED ORDER — ACETAMINOPHEN 650 MG RE SUPP: 650.0000 mg | Freq: Four times a day (QID) | RECTAL | Status: DC | PRN

## 2017-04-17 MED ORDER — IPRATROPIUM BROMIDE 0.06 % NA SOLN
2.0000 | Freq: Four times a day (QID) | NASAL | Status: DC | PRN
  Filled 2017-04-17: qty 15

## 2017-04-17 MED ORDER — IPRATROPIUM-ALBUTEROL 0.5-2.5 (3) MG/3ML IN SOLN
3.0000 mL | Freq: Once | RESPIRATORY_TRACT | Status: AC
  Administered 2017-04-17: 3 mL via RESPIRATORY_TRACT

## 2017-04-17 MED ORDER — FUROSEMIDE 10 MG/ML IJ SOLN
40.0000 mg | Freq: Once | INTRAMUSCULAR | Status: AC
  Administered 2017-04-17: 40 mg via INTRAVENOUS

## 2017-04-17 MED ORDER — PAROXETINE HCL 30 MG PO TABS
30.0000 mg | ORAL_TABLET | Freq: Every day | ORAL | Status: DC
  Administered 2017-04-18 – 2017-04-22 (×5): 30 mg via ORAL
  Filled 2017-04-17 (×5): qty 1

## 2017-04-17 MED ORDER — NITROGLYCERIN 0.4 MG SL SUBL: 0.4000 mg | SUBLINGUAL_TABLET | SUBLINGUAL | Status: DC | PRN

## 2017-04-17 MED ORDER — ONDANSETRON HCL 4 MG PO TABS: 4.0000 mg | ORAL_TABLET | Freq: Four times a day (QID) | ORAL | Status: DC | PRN

## 2017-04-17 MED ORDER — ARFORMOTEROL TARTRATE 15 MCG/2ML IN NEBU: 15.0000 ug | INHALATION_SOLUTION | Freq: Two times a day (BID) | RESPIRATORY_TRACT | Status: DC

## 2017-04-17 MED ORDER — SODIUM CHLORIDE 0.9% FLUSH
3.0000 mL | Freq: Two times a day (BID) | INTRAVENOUS | Status: DC
  Administered 2017-04-17: 3 mL via INTRAVENOUS
  Administered 2017-04-18: 6 mL via INTRAVENOUS
  Administered 2017-04-18 – 2017-04-22 (×7): 3 mL via INTRAVENOUS

## 2017-04-17 MED ORDER — METOPROLOL TARTRATE 25 MG PO TABS
12.5000 mg | ORAL_TABLET | Freq: Two times a day (BID) | ORAL | Status: DC
  Administered 2017-04-17 – 2017-04-22 (×7): 12.5 mg via ORAL
  Filled 2017-04-17 (×10): qty 1

## 2017-04-17 MED ORDER — ATORVASTATIN CALCIUM 20 MG PO TABS
80.0000 mg | ORAL_TABLET | Freq: Every day | ORAL | Status: DC
  Administered 2017-04-17 – 2017-04-22 (×6): 80 mg via ORAL
  Filled 2017-04-17 (×6): qty 4

## 2017-04-17 MED ORDER — ONDANSETRON HCL 4 MG/2ML IJ SOLN: 4.0000 mg | Freq: Four times a day (QID) | INTRAMUSCULAR | Status: DC | PRN

## 2017-04-17 MED ORDER — TIOTROPIUM BROMIDE MONOHYDRATE 18 MCG IN CAPS
1.0000 | ORAL_CAPSULE | Freq: Every day | RESPIRATORY_TRACT | Status: DC
  Administered 2017-04-18: 18 ug via RESPIRATORY_TRACT
  Filled 2017-04-17 (×2): qty 5

## 2017-04-17 MED ORDER — BUDESONIDE 0.5 MG/2ML IN SUSP: 0.5000 mg | Freq: Two times a day (BID) | RESPIRATORY_TRACT | Status: DC

## 2017-04-17 MED ORDER — PANTOPRAZOLE SODIUM 40 MG PO TBEC
80.0000 mg | DELAYED_RELEASE_TABLET | Freq: Every day | ORAL | Status: DC
  Administered 2017-04-18 – 2017-04-22 (×5): 80 mg via ORAL
  Filled 2017-04-17 (×5): qty 2

## 2017-04-17 MED ORDER — ORAL CARE MOUTH RINSE
15.0000 mL | Freq: Two times a day (BID) | OROMUCOSAL | Status: DC
  Administered 2017-04-17 – 2017-04-19 (×2): 15 mL via OROMUCOSAL

## 2017-04-17 MED ORDER — SODIUM CHLORIDE 0.9% FLUSH: 3.0000 mL | INTRAVENOUS | Status: DC | PRN

## 2017-04-17 MED ORDER — ARFORMOTEROL TARTRATE 15 MCG/2ML IN NEBU
15.0000 ug | INHALATION_SOLUTION | Freq: Two times a day (BID) | RESPIRATORY_TRACT | Status: DC
  Administered 2017-04-17 – 2017-04-22 (×9): 15 ug via RESPIRATORY_TRACT
  Filled 2017-04-17 (×11): qty 2

## 2017-04-17 MED ORDER — VITAMIN D 1000 UNITS PO TABS
2000.0000 [IU] | ORAL_TABLET | Freq: Every day | ORAL | Status: DC
  Administered 2017-04-18 – 2017-04-22 (×4): 2000 [IU] via ORAL
  Filled 2017-04-17 (×4): qty 2

## 2017-04-17 MED ORDER — ASPIRIN EC 81 MG PO TBEC
81.0000 mg | DELAYED_RELEASE_TABLET | Freq: Every day | ORAL | Status: DC
  Administered 2017-04-18 – 2017-04-22 (×5): 81 mg via ORAL
  Filled 2017-04-17 (×5): qty 1

## 2017-04-17 MED ORDER — IPRATROPIUM BROMIDE 0.02 % IN SOLN: 0.5000 mg | Freq: Four times a day (QID) | RESPIRATORY_TRACT | Status: DC

## 2017-04-17 MED ORDER — AMIODARONE HCL 200 MG PO TABS
200.0000 mg | ORAL_TABLET | Freq: Every day | ORAL | Status: DC
Start: 2017-04-18 — End: 2017-04-22
  Administered 2017-04-18 – 2017-04-22 (×5): 200 mg via ORAL
  Filled 2017-04-17 (×5): qty 1

## 2017-04-17 MED ORDER — GUAIFENESIN-CODEINE 100-10 MG/5ML PO SOLN
10.0000 mL | Freq: Four times a day (QID) | ORAL | Status: DC
  Administered 2017-04-18: 10 mL via ORAL
  Filled 2017-04-17: qty 10

## 2017-04-17 MED ORDER — APIXABAN 5 MG PO TABS
5.0000 mg | ORAL_TABLET | Freq: Two times a day (BID) | ORAL | Status: DC
  Administered 2017-04-17 – 2017-04-22 (×10): 5 mg via ORAL
  Filled 2017-04-17 (×10): qty 1

## 2017-04-17 MED ORDER — GUAIFENESIN ER 600 MG PO TB12
600.0000 mg | ORAL_TABLET | Freq: Two times a day (BID) | ORAL | Status: DC
  Administered 2017-04-17: 600 mg via ORAL
  Filled 2017-04-17: qty 1

## 2017-04-17 MED ORDER — SODIUM CHLORIDE 0.9 % IV SOLN: 250.0000 mL | INTRAVENOUS | Status: DC | PRN

## 2017-04-17 MED ORDER — NITROGLYCERIN 0.4 MG SL SUBL
0.4000 mg | SUBLINGUAL_TABLET | SUBLINGUAL | Status: DC | PRN
  Administered 2017-04-17: 0.4 mg via SUBLINGUAL

## 2017-04-17 MED ORDER — MECLIZINE HCL 25 MG PO TABS
12.5000 mg | ORAL_TABLET | Freq: Three times a day (TID) | ORAL | Status: DC | PRN
Start: 2017-04-17 — End: 2017-04-22
  Filled 2017-04-17: qty 1

## 2017-04-17 MED ORDER — BUDESONIDE 0.5 MG/2ML IN SUSP
0.5000 mg | Freq: Two times a day (BID) | RESPIRATORY_TRACT | Status: DC
  Administered 2017-04-17 – 2017-04-22 (×10): 0.5 mg via RESPIRATORY_TRACT
  Filled 2017-04-17 (×11): qty 2

## 2017-04-17 NOTE — Care Management (Addendum)
RNCM has asked [name] with [facility] to review case for [facility].  Patient appears to eligible and Merlene Morse will see him.

## 2017-04-17 NOTE — H&P (Signed)
[facility] [place] NAME: Donald Juarez    MR#:  [id number]  DATE OF BIRTH:  23-Aug-1945  DATE OF ADMISSION:  04/17/2017  PRIMARY CARE PHYSICIAN: Sofie Hartigan, MD   REQUESTING/REFERRING PHYSICIAN: Rudene Re, MD  CHIEF COMPLAINT:   Chief Complaint  Patient presents with  . Respiratory Distress    HISTORY OF PRESENT ILLNESS: Donald Juarez  is a 72 y.o. male with a known history of chronic systolic CHF, coronary artery disease, osteoarthritis, asthma and peripheral vascular disease and COPD who is presenting with acute onset of shortness of breath.  Patient was just recently discharged after acute CHF exasperation.  Presenting with worsening shortness of breath since this morning.  Denies any significant swelling of the lower extremity chest x-ray shows CHF.  PAST MEDICAL HISTORY:   Past Medical History:  Diagnosis Date  . Anginal pain ([place])    feels this as jaw pain.  takes imdur for this. rarely uses nitro.  [name] Anxiety   . Arthritis   . Asthma   . Bilateral claudication of lower limb ([place])   . Carotid stenosis    a. 07/2013 Carotid U/S: 100% RICA (pt prev reported h/o 18%), LICA 29-93%, bilat ECA 50%, normal vertebrals and subclavians bilat-->F/U needed in 01/2014.  . Cataract of left eye   . Cataract, right    a. 10/2010 s/p cataract surgery   . COPD (chronic obstructive pulmonary disease) ([place])   . Coronary artery disease    a. 06/1996 & 03/2005 Cath: non-obs dzs;  b. 07/2009 NSTEMI/Cath: LCX 99->PCI/DES extending into OM1;  c. 01/2013 Cath: patent LCX stent-->Med Rx.; d. cath 03/2014: RPAV dzs->Med rx; e. 10/2014 Cath/PCI: LM 30, LAD 20p/m, LCX 50ost/p, 10 ISR, 60d, OM1 min irregs, OM2 50, RCA 30ost, 73m, 60d, RPDA 70(2.5x8 Xience DES), RPAV 90small(PTCA); f. 10/2014 Relook Cath: RPAV 99(Med Rx), EF 45-50.  [name] Dysrhythmia    since ablation, flutter resolved.  . Essential hypertension   . Hyperkalemia   . Hyperlipidemia   .  Myocardial infarction ([place]) 2015   had stent inserted and then had heart attack after. stent inserted due to blockage  . Paroxysmal atrial flutter ([place])    a. CHA2DS2VASc = 3 -> eliquis;  b. 07/2013 Echo: EF 50-55%, mildly dil LA.c. s/p RFCA 10/31/2013 by Dr Caryl Comes  . Prostate cancer ([place])   . Pulmonary nodule, right    a. 0.7cm RLL - stable by CT 06/2013.  [name] Rectal cancer ([place])    a. Status post colostomy  . Seizures ([place])    a. last 30 years ago  . Shingles    a. 09/2012.  . SSS (sick sinus syndrome) ([place])    a. 12/2009 S/P MDT [place] ADDR01 DC PPM, ser # [id number] ([name]).  . Syncope and collapse     PAST SURGICAL HISTORY:  Past Surgical History:  Procedure Laterality Date  . ABLATION  10/31/2013   CTI ablation by Dr Caryl Comes for atrial flutter  . ATRIAL FLUTTER ABLATION N/A 10/31/2013   Procedure: ATRIAL FLUTTER ABLATION;  Surgeon: [name], MD;  Location: [facility] CATH LAB;  Service: Cardiovascular;  Laterality: N/A;  . CARDIAC CATHETERIZATION  07/2009   [facility]  . CARDIAC CATHETERIZATION  01/2013   [facility]  . CARDIAC CATHETERIZATION  03/2014   [facility]  . CARDIAC CATHETERIZATION N/A 11/02/2014   Procedure: Left Heart Cath and Cors/Grafts Angiography;  Surgeon: [name], MD;  Location: [place] CV LAB;  Service:  Cardiovascular;  Laterality: N/A;  . CARDIAC CATHETERIZATION N/A 11/02/2014   Procedure: Coronary Stent Intervention;  Surgeon: [name], MD;  Location: [place] CV LAB;  Service: Cardiovascular;  Laterality: N/A;  . CARDIAC CATHETERIZATION N/A 11/03/2014   Procedure: Left Heart Cath and Coronary Angiography;  Surgeon: [name], MD;  Location: [place] CV LAB;  Service: Cardiovascular;  Laterality: N/A;  . CARDIAC SURGERY  2011   Stents placed   . CATARACT EXTRACTION Left   . CATARACT EXTRACTION W/PHACO Left 06/01/2014   Procedure: CATARACT EXTRACTION PHACO AND INTRAOCULAR LENS PLACEMENT (IOC);  Surgeon: [name], MD;  Location: [name] ORS;   Service: Ophthalmology;  Laterality: Left;  . cataract surgery  2012  . colon surgery     cancer removal  . COLON SURGERY     colostomy  . COLONOSCOPY WITH PROPOFOL N/A 05/24/2016   Procedure: COLONOSCOPY WITH PROPOFOL;  Surgeon: [name], MD;  Location: [facility] ENDOSCOPY;  Service: Endoscopy;  Laterality: N/A;  . CORONARY ANGIOPLASTY    . INSERT / REPLACE / REMOVE PACEMAKER  2011   just a pacer  . KNEE ARTHROSCOPY Right 1980's  . LEFT HEART CATH AND CORONARY ANGIOGRAPHY N/A 02/08/2017   Procedure: LEFT HEART CATH AND CORONARY ANGIOGRAPHY;  Surgeon: [name], MD;  Location: [place] CV LAB;  Service: Cardiovascular;  Laterality: N/A;  . PACEMAKER PLACEMENT  01/17/2010  . TOTAL KNEE ARTHROPLASTY Right 01/14/2016   Procedure: TOTAL KNEE ARTHROPLASTY;  Surgeon: [name], MD;  Location: [name] ORS;  Service: Orthopedics;  Laterality: Right;    SOCIAL HISTORY:  Social History   Tobacco Use  . Smoking status: Former Smoker    Packs/day: 1.00    Years: 30.00    Pack years: 30.00    Types: Cigarettes    Last attempt to quit: 07/26/2008    Years since quitting: 8.7  . Smokeless tobacco: Never Used  Substance Use Topics  . Alcohol use: No    FAMILY HISTORY:  Family History  Problem Relation Age of Onset  . Cancer Mother        'male cancer"  . Hypertension Mother   . Hyperlipidemia Mother   . Heart disease Father   . Heart attack Father   . Hypertension Father   . Hyperlipidemia Father     DRUG ALLERGIES: No Known Allergies  REVIEW OF SYSTEMS:   CONSTITUTIONAL: No fever, fatigue or weakness.  EYES: No blurred or double vision.  EARS, NOSE, AND THROAT: No tinnitus or ear pain.  RESPIRATORY: Dry cough, positive shortness of breath, wheezing or hemoptysis.  CARDIOVASCULAR: No chest pain, orthopnea, positive edema.  GASTROINTESTINAL: No nausea, vomiting, diarrhea or abdominal pain.  GENITOURINARY: No dysuria, hematuria.  ENDOCRINE: No polyuria,  nocturia,  HEMATOLOGY: No anemia, easy bruising or bleeding SKIN: No rash or lesion. MUSCULOSKELETAL: No joint pain or arthritis.   NEUROLOGIC: No tingling, numbness, weakness.  PSYCHIATRY: No anxiety or depression.   MEDICATIONS AT HOME:  Prior to Admission medications   Medication Sig Start Date End Date Taking? Authorizing Provider  amiodarone (PACERONE) 200 MG tablet Take 200 mg by mouth daily.    [provider]  apixaban (ELIQUIS) 5 MG TABS tablet Take 5 mg by mouth 2 (two) times daily. 02/26/17   [provider]  arformoterol (BROVANA) 15 MCG/2ML NEBU Take 2 mLs (15 mcg total) by nebulization 2 (two) times daily. Take 2 mLs (15 mcg total) by nebulization 2 (two) times daily. DX code 493.20  Every morning and 1800 07/11/16   Flora Lipps, MD  aspirin EC 81 MG tablet Take 81 mg by mouth daily.    [provider]  atorvastatin (LIPITOR) 80 MG tablet Take 1 tablet (80 mg total) by mouth daily. 05/09/16 03/12/18  Wende Bushy, MD  budesonide (PULMICORT) 0.5 MG/2ML nebulizer solution Take 2 mLs (0.5 mg total) by nebulization 2 (two) times daily. DX code 493.20 Every morning and 1800 07/11/16   Flora Lipps, MD  budesonide-formoterol [facility]) 160-4.5 MCG/ACT inhaler Inhale 2 puffs into the lungs 2 (two) times daily.    [provider]  Cholecalciferol (VITAMIN D) 2000 UNITS tablet Take 2,000 Units by mouth daily.    [provider]  furosemide (LASIX) 40 MG tablet Take 1 tablet (40 mg total) by mouth daily. 03/28/17   Hillary Bow, MD  guaiFENesin (MUCINEX) 600 MG 12 hr tablet Take 1 tablet (600 mg total) by mouth 2 (two) times daily. 03/14/17   Gladstone Lighter, MD  ipratropium (ATROVENT) 0.02 % nebulizer solution Take 2.5 mLs (0.5 mg total) by nebulization every 6 (six) hours. 03/28/17   Hillary Bow, MD  ipratropium (ATROVENT) 0.06 % nasal spray Place 2 sprays into the nose 4 (four) times daily. 04/04/17 04/04/18  Laverle Hobby, MD   ipratropium-albuterol (DUONEB) 0.5-2.5 (3) MG/3ML SOLN Take 3 mLs by nebulization every 6 (six) hours as needed.    [provider]  isosorbide mononitrate (IMDUR) 30 MG 24 hr tablet Take 1 tablet (30 mg total) by mouth daily. 03/14/17 03/14/18  Gladstone Lighter, MD  levalbuterol Penne Lash) 0.63 MG/3ML nebulizer solution Take 3 mLs (0.63 mg total) by nebulization every 4 (four) hours as needed for wheezing or shortness of breath. 03/28/17 03/28/18  Hillary Bow, MD  meclizine (ANTIVERT) 12.5 MG tablet Take 1-2 tablets by mouth 3 (three) times daily as needed. 01/05/17   [provider]  metoprolol tartrate (LOPRESSOR) 25 MG tablet Take 0.5 tablets (12.5 mg total) by mouth 2 (two) times daily. 03/23/17   Epifanio Lesches, MD  nitroGLYCERIN (NITROSTAT) 0.4 MG SL tablet Place 1 tablet (0.4 mg total) under the tongue every 5 (five) minutes as needed for chest pain. 05/09/16   Wende Bushy, MD  omeprazole (PRILOSEC) 40 MG capsule Take 40 mg by mouth daily.    [provider]  PARoxetine (PAXIL) 30 MG tablet Take 30 mg by mouth daily.     [provider]  Tiotropium Bromide Monohydrate (SPIRIVA RESPIMAT) 1.25 MCG/ACT AERS Inhale 2 puffs into the lungs 2 (two) times daily. 07/11/16   Flora Lipps, MD  traMADol (ULTRAM) 50 MG tablet Take 1-2 tablets (50-100 mg total) by mouth every 4 (four) hours as needed for moderate pain. Patient taking differently: Take 50 mg by mouth every 6 (six) hours as needed for moderate pain.  01/16/16   Duanne Guess, PA-C      PHYSICAL EXAMINATION:   VITAL SIGNS: Blood pressure 92/64, pulse 62, resp. rate (!) 22, height 5\' 9"  (1.753 m), weight 200 lb (90.7 kg), SpO2 97 %.  GENERAL:  72 y.o.-year-old patient lying in the bed acute respiratory failure EYES: Pupils equal, round, reactive to light and accommodation. No scleral icterus. Extraocular muscles intact.  HEENT: Head atraumatic, normocephalic. Oropharynx and nasopharynx clear.   NECK:  Supple, no jugular venous distention. No thyroid enlargement, no tenderness.  LUNGS: Crackles at the bases no sensory muscle usage CARDIOVASCULAR: S1, S2 normal. No murmurs, rubs, or gallops.  ABDOMEN: Soft, nontender, nondistended. Bowel sounds present. No  organomegaly or mass.  EXTREMITIES: No pedal edema, cyanosis, or clubbing.  NEUROLOGIC: Cranial nerves II through XII are intact. Muscle strength 5/5 in all extremities. Sensation intact. Gait not checked.  PSYCHIATRIC: The patient is alert and oriented x 3.  SKIN: No obvious rash, lesion, or ulcer.   LABORATORY PANEL:   CBC Recent Labs  Lab 04/17/17 1300  WBC 7.9  HGB 11.8*  HCT 36.4*  PLT 354  MCV 90.2  MCH 29.2  MCHC 32.3  RDW 16.2*  LYMPHSABS 1.1  MONOABS 0.8  EOSABS 0.3  BASOSABS 0.1   ------------------------------------------------------------------------------------------------------------------  Chemistries  Recent Labs  Lab 04/17/17 1300  NA 138  K 4.3  CL 103  CO2 24  GLUCOSE 227*  BUN 19  CREATININE 1.27*  CALCIUM 8.2*  AST 73*  ALT 42  ALKPHOS 121  BILITOT 0.5   ------------------------------------------------------------------------------------------------------------------ estimated creatinine clearance is 58.5 mL/min (A) (by C-G formula based on SCr of 1.27 mg/dL (H)). ------------------------------------------------------------------------------------------------------------------ No results for input(s): TSH, T4TOTAL, T3FREE, THYROIDAB in the last 72 hours.  Invalid input(s): FREET3   Coagulation profile No results for input(s): INR, PROTIME in the last 168 hours. ------------------------------------------------------------------------------------------------------------------- No results for input(s): DDIMER in the last 72 hours. -------------------------------------------------------------------------------------------------------------------  Cardiac Enzymes Recent Labs   Lab 04/17/17 1300  TROPONINI 0.04*   ------------------------------------------------------------------------------------------------------------------ Invalid input(s): POCBNP  ---------------------------------------------------------------------------------------------------------------  Urinalysis    Component Value Date/Time   COLORURINE YELLOW (A) 03/22/2017 1450   APPEARANCEUR CLEAR (A) 03/22/2017 1450   APPEARANCEUR Hazy 05/11/2014 0047   LABSPEC 1.014 03/22/2017 1450   LABSPEC 1.015 05/11/2014 0047   PHURINE 6.0 03/22/2017 1450   GLUCOSEU NEGATIVE 03/22/2017 1450   GLUCOSEU 150 mg/dL 05/11/2014 0047   HGBUR SMALL (A) 03/22/2017 1450   BILIRUBINUR NEGATIVE 03/22/2017 1450   BILIRUBINUR Negative 05/11/2014 0047   KETONESUR NEGATIVE 03/22/2017 1450   PROTEINUR NEGATIVE 03/22/2017 1450   NITRITE NEGATIVE 03/22/2017 1450   LEUKOCYTESUR NEGATIVE 03/22/2017 1450   LEUKOCYTESUR 3+ 05/11/2014 0047     RADIOLOGY: Dg Chest Portable 1 View  Result Date: 04/17/2017 CLINICAL DATA:  Respiratory distress Asthma, COPD, CAD, h/o MI (2015), h/o prostate cancer, a-flutter EXAM: PORTABLE CHEST 1 VIEW COMPARISON:  03/25/2017 FINDINGS: Status post median sternotomy and CABG. LEFT-sided transvenous pacemaker leads to the RIGHT atrium and RIGHT ventricle. Heart size is normal accounting for patient position. There are interstitial Kerley B-lines consistent with interstitial edema. Mild confluent edema at the bases. IMPRESSION: Pulmonary edema. Electronically Signed   By: Nolon Nations M.D.   On: 04/17/2017 13:23    EKG: Orders placed or performed during the hospital encounter of 04/17/17  . ED EKG  . ED EKG  . EKG 12-Lead  . EKG 12-Lead    IMPRESSION AND PLAN: Patient is a 72 year old white male with history of systolic CHF presenting with worsening shortness of breath  1.  Acute on chronic systolic CHF we will treat with IV Lasix continue BiPAP Echo done in February 2019 no need  to repeat  2.  Acute kidney injury likely due to fluid overload monitor renal function  3.  COPD without exasperation continue home inhalers Continue BiPAP for now  4.  History of atrial fibrillation continue therapy with amiodarone and Eliquis and aspirin  5.  Hyperlipidemia continue Lipitor  6.  Coronary artery disease continue Imdur and aspirin   All the records are reviewed and case discussed with ED provider. Management plans discussed with the patient, family and they are in agreement.  CODE STATUS:  Code Status History    Date Active Date Inactive Code Status Order ID Comments User Context   03/25/2017 08:23 03/28/2017 17:32 Full Code [id number]  Hillary Bow, MD ED   03/19/2017 08:29 03/23/2017 15:02 Full Code [id number]  Epifanio Lesches, MD ED   03/12/2017 11:35 03/14/2017 16:53 Full Code [id number]  Dustin Flock, MD ED   02/07/2017 19:27 02/10/2017 08:25 Full Code [id number]  Nicholes Mango, MD Inpatient   01/14/2016 11:22 01/17/2016 16:59 Full Code [id number]  [name], MD Inpatient   11/03/2014 16:40 11/04/2014 13:57 Full Code [id number]  [name], MD Inpatient   11/02/2014 13:33 11/03/2014 16:40 Full Code [id number]  [name], MD Inpatient   10/31/2013 12:08 11/01/2013 01:02 Full Code [id number]  [name], MD Inpatient       TOTAL TIME TAKING CARE OF THIS PATIENT: 55 minutes of critical care time spent   Dustin Flock M.D on 04/17/2017 at 1:59 PM  Between 7am to 6pm - Pager - [phone]  After 6pm go to [email or web address] - password [email or web address]  [facility] [facility]  [phone]  CC: Primary care physician; Sofie Hartigan, MD

## 2017-04-17 NOTE — Consult Note (Addendum)
PULMONARY / CRITICAL CARE MEDICINE   Name: Donald Juarez MRN: [id number] DOB: 1945/10/14    ADMISSION DATE:  04/17/2017   CONSULTATION DATE:  04/17/2017  REFERRING MD:  Dr Posey Pronto  REASON: Acute hypoxic respiratory failure  HISTORY OF PRESENT ILLNESS:   This is a 72 y/o male with a medical history as indicated below who presented to the ED with complaints of worsening dyspnea. States that he was doing well, worked with home PT but then became acutely short of breath.  He was hypoxic and hypertensive with SBP in the 200s when EMS arrived. CXR in the ED showed acute pulmonary edema. He was placed on BiPAP and admitted to the ICU. He is now off BiPAP and doing well on 6L [place]. He uses 2L [place] at home. Denies fever, chills, nausea and vomiting Patient was recently hospitalized from 2/24/2/27 for COPD exacerbation. He is on eliquis for Afib  PAST MEDICAL HISTORY :  He  has a past medical history of Anginal pain ([place]), Anxiety, Arthritis, Asthma, Bilateral claudication of lower limb ([place]), Carotid stenosis, Cataract of left eye, Cataract, right, COPD (chronic obstructive pulmonary disease) ([place]), Coronary artery disease, Dysrhythmia, Essential hypertension, Hyperkalemia, Hyperlipidemia, Myocardial infarction ([place]) (2015), Paroxysmal atrial flutter ([place]), Prostate cancer ([place]), Pulmonary nodule, right, Rectal cancer ([place]), Seizures ([place]), Shingles, SSS (sick sinus syndrome) ([place]), and Syncope and collapse.  PAST SURGICAL HISTORY: He  has a past surgical history that includes Cardiac surgery (2011); cataract surgery (2012); colon surgery; Knee arthroscopy (Right, 1980's); Ablation (10/31/2013); Atrial flutter ablation (N/A, 10/31/2013); Cardiac catheterization (07/2009); Cardiac catheterization (01/2013); Cardiac catheterization (03/2014); Coronary angioplasty; Colon surgery; Cataract extraction w/PHACO (Left, 06/01/2014); Cataract extraction (Left); Cardiac catheterization (N/A, 11/02/2014); Cardiac catheterization  (N/A, 11/02/2014); Cardiac catheterization (N/A, 11/03/2014); Insert / replace / remove pacemaker (2011); pacemaker placement (01/17/2010); Total knee arthroplasty (Right, 01/14/2016); Colonoscopy with propofol (N/A, 05/24/2016); and LEFT HEART CATH AND CORONARY ANGIOGRAPHY (N/A, 02/08/2017).  No Known Allergies  No current facility-administered medications on file prior to encounter.    Current Outpatient Medications on File Prior to Encounter  Medication Sig  . ALPRAZolam (XANAX) 0.25 MG tablet Take 1 tablet by mouth 2 (two) times daily as needed.  [name] amiodarone (PACERONE) 200 MG tablet Take 200 mg by mouth daily.  [name] apixaban (ELIQUIS) 5 MG TABS tablet Take 5 mg by mouth 2 (two) times daily.  [name] arformoterol (BROVANA) 15 MCG/2ML NEBU Take 2 mLs (15 mcg total) by nebulization 2 (two) times daily. Take 2 mLs (15 mcg total) by nebulization 2 (two) times daily. DX code 493.20 Every morning and 1800  . aspirin EC 81 MG tablet Take 81 mg by mouth daily.  [name] atorvastatin (LIPITOR) 80 MG tablet Take 1 tablet (80 mg total) by mouth daily.  . budesonide (PULMICORT) 0.5 MG/2ML nebulizer solution Take 2 mLs (0.5 mg total) by nebulization 2 (two) times daily. DX code 493.20 Every morning and 1800  . Cholecalciferol (VITAMIN D) 2000 UNITS tablet Take 2,000 Units by mouth daily.  . furosemide (LASIX) 40 MG tablet Take 1 tablet (40 mg total) by mouth daily.  [name] guaiFENesin (MUCINEX) 600 MG 12 hr tablet Take 1 tablet (600 mg total) by mouth 2 (two) times daily.  . isosorbide mononitrate (IMDUR) 30 MG 24 hr tablet Take 1 tablet (30 mg total) by mouth daily.  . metoprolol tartrate (LOPRESSOR) 25 MG tablet Take 0.5 tablets (12.5 mg total) by mouth 2 (two) times daily. (Patient taking differently: Take 25 mg by mouth 2 (two) times  daily. )  . omeprazole (PRILOSEC) 40 MG capsule Take 40 mg by mouth daily.  [name] PARoxetine (PAXIL) 30 MG tablet Take 30 mg by mouth daily.   . Tiotropium Bromide Monohydrate (SPIRIVA RESPIMAT)  1.25 MCG/ACT AERS Inhale 2 puffs into the lungs 2 (two) times daily.  . budesonide-formoterol (SYMBICORT) 160-4.5 MCG/ACT inhaler Inhale 2 puffs into the lungs 2 (two) times daily.  [name] ipratropium (ATROVENT) 0.02 % nebulizer solution Take 2.5 mLs (0.5 mg total) by nebulization every 6 (six) hours.  [name] ipratropium (ATROVENT) 0.06 % nasal spray Place 2 sprays into the nose 4 (four) times daily.  [name] levalbuterol (XOPENEX) 0.63 MG/3ML nebulizer solution Take 3 mLs (0.63 mg total) by nebulization every 4 (four) hours as needed for wheezing or shortness of breath.  . meclizine (ANTIVERT) 12.5 MG tablet Take 1-2 tablets by mouth 3 (three) times daily as needed.  . nitroGLYCERIN (NITROSTAT) 0.4 MG SL tablet Place 1 tablet (0.4 mg total) under the tongue every 5 (five) minutes as needed for chest pain.  . traMADol (ULTRAM) 50 MG tablet Take 1-2 tablets (50-100 mg total) by mouth every 4 (four) hours as needed for moderate pain. (Patient not taking: Reported on 04/17/2017)    FAMILY HISTORY:  His indicated that his mother is deceased. He indicated that his father is deceased. He indicated that his maternal grandmother is deceased. He indicated that his maternal grandfather is deceased. He indicated that his paternal grandmother is deceased. He indicated that his paternal grandfather is deceased.   SOCIAL HISTORY: He  reports that he quit smoking about 8 years ago. His smoking use included cigarettes. He has a 30.00 pack-year smoking history. he has never used smokeless tobacco. He reports that he does not drink alcohol or use drugs.  REVIEW OF SYSTEMS:   Constitutional: Negative for fever and chills.  HENT: Negative for congestion and rhinorrhea.  Eyes: Negative for redness and visual disturbance.  Respiratory:positive for wet cough with some sputum, shortness of breath and wheezing.  Cardiovascular: Negative for chest pain and palpitations.  Gastrointestinal: Negative  for nausea , vomiting and abdominal  pain and  Loose stools Genitourinary: Negative for dysuria and urgency.  Endocrine: Denies polyuria, polyphagia and heat intolerance Musculoskeletal: Negative for myalgias and arthralgias.  Skin: Negative for pallor and wound.  Neurological: Negative for dizziness and headaches   SUBJECTIVE:   VITAL SIGNS: BP 126/69   Pulse 63   Temp (!) 96.7 F (35.9 C) (Axillary)   Resp 19   Ht 5\' 9"  (1.753 m)   Wt 216 lb 11.4 oz (98.3 kg)   SpO2 100%   BMI 32.00 kg/m   HEMODYNAMICS:    VENTILATOR SETTINGS: FiO2 (%):  [45 %] 45 %  INTAKE / OUTPUT: I/O last 3 completed shifts: In: 250 [P.O.:250] Out: 1000 [Urine:1000]  PHYSICAL EXAMINATION: General:  NAD Neuro:  AAO XS, no deficits HEENT: PERRLA, mild JVD Cardiovascular: irregular-irregular, s1/s2, no MRG, +2 pulses Lungs: bilateral breaths sounds, diffuse rhonchi in all lung fields, bibasilar crackles, mild expiratory wheezes Abdomen:  Normal bowel sounds Musculoskeletal:  +rom, no deformities Skin: warm and dry  LABS:  BMET Recent Labs  Lab 04/17/17 1300  NA 138  K 4.3  CL 103  CO2 24  BUN 19  CREATININE 1.27*  GLUCOSE 227*    Electrolytes Recent Labs  Lab 04/17/17 1300  CALCIUM 8.2*    CBC Recent Labs  Lab 04/17/17 1300  WBC 7.9  HGB 11.8*  HCT 36.4*  PLT 354  Coag's No results for input(s): APTT, INR in the last 168 hours.  Sepsis Markers No results for input(s): LATICACIDVEN, PROCALCITON, O2SATVEN in the last 168 hours.  ABG No results for input(s): PHART, PCO2ART, PO2ART in the last 168 hours.  Liver Enzymes Recent Labs  Lab 04/17/17 1300  AST 73*  ALT 42  ALKPHOS 121  BILITOT 0.5  ALBUMIN 3.1*    Cardiac Enzymes Recent Labs  Lab 04/17/17 1300 04/17/17 1630  TROPONINI 0.04* 0.07*    Glucose Recent Labs  Lab 04/17/17 1616  GLUCAP 131*    Imaging Dg Chest Portable 1 View  Result Date: 04/17/2017 CLINICAL DATA:  Respiratory distress Asthma, COPD, CAD, h/o MI (2015),  h/o prostate cancer, a-flutter EXAM: PORTABLE CHEST 1 VIEW COMPARISON:  03/25/2017 FINDINGS: Status post median sternotomy and CABG. LEFT-sided transvenous pacemaker leads to the RIGHT atrium and RIGHT ventricle. Heart size is normal accounting for patient position. There are interstitial [place] B-lines consistent with interstitial edema. Mild confluent edema at the bases. IMPRESSION: Pulmonary edema. Electronically Signed   By: Nolon Nations M.D.   On: 04/17/2017 13:23    STUDIES:  Last echo 03/13/2017 LV EF: 35% -   40%  CULTURES: none  ANTIBIOTICS: none  SIGNIFICANT EVENTS: 2/27>discharged 3/19>re-admitted  LINES/TUBES: PIV  DISCUSSION: 72 Y/O male presenting with acute hypoxic respiratory failure secondary to acute CHF exacerbation  ASSESSMENT Acute hypoxic/hypercarbic respiratory failure Acute CHF exacerbation Acute COPD exacerbation Afib on eliquis AKI CAD S/P MI with stent placement Hypertension  PLAN Hemodynamics per ICU protocol BiPAP and titrate to [place] as tolerated IV lasix Monitor Is/Os Continue bronchodilators and steroids Continue all home medications PRN MORPHINE for dyspnea GI and DVT prophyalxis   FAMILY  - Updates: no family at bedside. Will update when available  - Inter-disciplinary family meet or Palliative Care meeting due by:  day [place]. [facility] ANP-BC Pulmonary and Critical Care Medicine [facility] Pager [phone] or [phone]  NB: This document was prepared using Dragon voice recognition software and may include unintentional dictation errors.   Pager: [phone]  04/17/2017, 10:05 PM

## 2017-04-17 NOTE — ED Notes (Signed)
1" nitro paste patch removed from L chest d/t BP 92/64, EDP [place] notified.

## 2017-04-17 NOTE — ED Triage Notes (Signed)
Pt presents to ED via [facility] from home c/o respiratory distress. EMS report O2 sat 82% on RA, pt arrives on CPAP with sat 93%. Pt given duoneb and x1 albuterol neb PTA by EMS. Pt arrives with 1" nitro paste to chest for SBP 201. Pt seen for similar symptoms 03/25/17 in this ED and was admitted for COPD.

## 2017-04-17 NOTE — Plan of Care (Signed)
  Progressing Education: Knowledge of General Education information will improve 04/17/2017 1919 - Progressing by Dena Billet, RN Clinical Measurements: Will remain free from infection 04/17/2017 1919 - Progressing by Dena Billet, RN Respiratory complications will improve 04/17/2017 1919 - Progressing by Dena Billet, RN Note Pt transitioned from BiPap to 6L [place]

## 2017-04-17 NOTE — Significant Event (Signed)
Pt admitted to ICU 17, on bipap. Hooked up to monitor, VSS. Pt transitioned to 6L [place], O2 saturation remains in high 90s. Blood pressure remains normotensive, no complaints of pain.

## 2017-04-17 NOTE — ED Provider Notes (Signed)
[facility] Emergency Department Provider Note  ____________________________________________  Time seen: Approximately 1:33 PM  I have reviewed the triage vital signs and the nursing notes.   HISTORY  Chief Complaint Respiratory Distress  Level 5 caveat:  Portions of the history and physical were unable to be obtained due to respiratory distress   HPI Donald Juarez is a 72 y.o. male with a history of CAD, COPD, CHF (EF 35-40%)who presents for evaluation of respiratory distress.history is limited due to severe respiratory distress. Patient was found to be satting 82% on room air when EMS arrived. Patient has had a cough for the last few days and this morning had severe respiratory distress. He received 2 breathing treatments in route. Patient was severely hypertensive and received an inch of Nitropaste to the chest. Patient denies fever, vomiting or diarrhea. He denies chest pain.  Past Medical History:  Diagnosis Date  . Anginal pain ([place])    feels this as jaw pain.  takes imdur for this. rarely uses nitro.  [name] Anxiety   . Arthritis   . Asthma   . Bilateral claudication of lower limb ([place])   . Carotid stenosis    a. 07/2013 Carotid U/S: 100% RICA (pt prev reported h/o 38%), LICA 75-64%, bilat ECA 50%, normal vertebrals and subclavians bilat-->F/U needed in 01/2014.  . Cataract of left eye   . Cataract, right    a. 10/2010 s/p cataract surgery   . COPD (chronic obstructive pulmonary disease) ([place])   . Coronary artery disease    a. 06/1996 & 03/2005 Cath: non-obs dzs;  b. 07/2009 NSTEMI/Cath: LCX 99->PCI/DES extending into OM1;  c. 01/2013 Cath: patent LCX stent-->Med Rx.; d. cath 03/2014: RPAV dzs->Med rx; e. 10/2014 Cath/PCI: LM 30, LAD 20p/m, LCX 50ost/p, 10 ISR, 60d, OM1 min irregs, OM2 50, RCA 30ost, 6m, 60d, RPDA 70(2.5x8 Xience DES), RPAV 90small(PTCA); f. 10/2014 Relook Cath: RPAV 99(Med Rx), EF 45-50.  [name] Dysrhythmia    since ablation, flutter resolved.   . Essential hypertension   . Hyperkalemia   . Hyperlipidemia   . Myocardial infarction ([place]) 2015   had stent inserted and then had heart attack after. stent inserted due to blockage  . Paroxysmal atrial flutter ([place])    a. CHA2DS2VASc = 3 -> eliquis;  b. 07/2013 Echo: EF 50-55%, mildly dil LA.c. s/p RFCA 10/31/2013 by Dr Caryl Comes  . Prostate cancer ([place])   . Pulmonary nodule, right    a. 0.7cm RLL - stable by CT 06/2013.  [name] Rectal cancer ([place])    a. Status post colostomy  . Seizures ([place])    a. last 30 years ago  . Shingles    a. 09/2012.  . SSS (sick sinus syndrome) ([place])    a. 12/2009 S/P MDT Adapta ADDR01 DC PPM, ser # [id number] ([name]).  . Syncope and collapse     Patient Active Problem List   Diagnosis Date Noted  . COPD exacerbation ([place]) 03/25/2017  . Acute respiratory failure ([place]) 03/22/2017  . Acute respiratory failure with hypoxia ([place]) 03/19/2017  . Acute CHF (congestive heart failure) ([place]) 03/12/2017  . Primary localized osteoarthritis of right knee 01/14/2016  . Essential hypertension   . Hyperlipidemia   . SSS (sick sinus syndrome) ([place])   . Unstable angina ([place]) 11/03/2014  . NSTEMI (non-ST elevated myocardial infarction) ([place])   . Effort angina ([place]) 11/02/2014  . S/P PTCA (percutaneous transluminal coronary angioplasty) 11/02/2014  . Coronary artery disease involving native coronary artery   .  Prostate cancer ([place])   . Anxiety and depression 06/18/2014  . Fatigue 11/14/2013  . Typical atrial flutter ([place]) 10/31/2013  . Bilateral claudication of lower limb ([place])   . Coronary artery disease   . Paroxysmal atrial flutter ([place])   . Hypertension   . Carotid stenosis   . COPD, severe GOLD GRADE D 03/28/2013  . Solitary pulmonary nodule 03/28/2013    Past Surgical History:  Procedure Laterality Date  . ABLATION  10/31/2013   CTI ablation by Dr Caryl Comes for atrial flutter  . ATRIAL FLUTTER ABLATION N/A 10/31/2013   Procedure: ATRIAL FLUTTER ABLATION;   Surgeon: [name], MD;  Location: [facility] CATH LAB;  Service: Cardiovascular;  Laterality: N/A;  . CARDIAC CATHETERIZATION  07[facility]   [facility]  . CARDIAC CATHETERIZATION  01/2013   [facility]  . CARDIAC CATHETERIZATION  03/2014   [facility]  . CARDIAC CATHETERIZATION N/A 11/02/2014   Procedure: Left Heart Cath and Cors/Grafts Angiography;  Surgeon: [name], MD;  Location: [place] CV LAB;  Service: Cardiovascular;  Laterality: N/A;  . CARDIAC CATHETERIZATION N/A 11/02/2014   Procedure: Coronary Stent Intervention;  Surgeon: [name], MD;  Location: [place] CV LAB;  Service: Cardiovascular;  Laterality: N/A;  . CARDIAC CATHETERIZATION N/A 11/03/2014   Procedure: Left Heart Cath and Coronary Angiography;  Surgeon: [name], MD;  Location: [place] CV LAB;  Service: Cardiovascular;  Laterality: N/A;  . CARDIAC SURGERY  2011   Stents placed   . CATARACT EXTRACTION Left   . CATARACT EXTRACTION W/PHACO Left 06/01/2014   Procedure: CATARACT EXTRACTION PHACO AND INTRAOCULAR LENS PLACEMENT (IOC);  Surgeon: [name], MD;  Location: [name] ORS;  Service: Ophthalmology;  Laterality: Left;  . cataract surgery  2012  . colon surgery     cancer removal  . COLON SURGERY     colostomy  . COLONOSCOPY WITH PROPOFOL N/A 05/24/2016   Procedure: COLONOSCOPY WITH PROPOFOL;  Surgeon: [name], MD;  [facility] ENDOSCOPY;  Service: Endoscopy;  Laterality: N/A;  . CORONARY ANGIOPLASTY    . INSERT / REPLACE / REMOVE PACEMAKER  2011   just a pacer  . KNEE ARTHROSCOPY Right 1980's  . LEFT HEART CATH AND CORONARY ANGIOGRAPHY N/A 02/08/2017   Procedure: LEFT HEART CATH AND CORONARY ANGIOGRAPHY;  Surgeon: [name], MD;  Location: [place] CV LAB;  Service: Cardiovascular;  Laterality: N/A;  . PACEMAKER PLACEMENT  01/17/2010  . TOTAL KNEE ARTHROPLASTY Right 01/14/2016   Procedure: TOTAL KNEE ARTHROPLASTY;  Surgeon: [name], MD;  Location: [name] ORS;  Service:  Orthopedics;  Laterality: Right;    Prior to Admission medications   Medication Sig Start Date End Date Taking? Authorizing Provider  amiodarone (PACERONE) 200 MG tablet Take 200 mg by mouth daily.    [provider]  apixaban (ELIQUIS) 5 MG TABS tablet Take 5 mg by mouth 2 (two) times daily. 02/26/17   [provider]  arformoterol (BROVANA) 15 MCG/2ML NEBU Take 2 mLs (15 mcg total) by nebulization 2 (two) times daily. Take 2 mLs (15 mcg total) by nebulization 2 (two) times daily. DX code 493.20 Every morning and 1800 07/11/16   Flora Lipps, MD  aspirin EC 81 MG tablet Take 81 mg by mouth daily.    [provider]  atorvastatin (LIPITOR) 80 MG tablet Take 1 tablet (80 mg total) by mouth daily. 05/09/16 03/12/18  Wende Bushy, MD  budesonide (PULMICORT) 0.5 MG/2ML nebulizer solution Take 2 mLs (0.5 mg  total) by nebulization 2 (two) times daily. DX code 493.20 Every morning and 1800 07/11/16   Flora Lipps, MD  budesonide-formoterol [facility]) 160-4.5 MCG/ACT inhaler Inhale 2 puffs into the lungs 2 (two) times daily.    [provider]  Cholecalciferol (VITAMIN D) 2000 UNITS tablet Take 2,000 Units by mouth daily.    [provider]  furosemide (LASIX) 40 MG tablet Take 1 tablet (40 mg total) by mouth daily. 03/28/17   Hillary Bow, MD  guaiFENesin (MUCINEX) 600 MG 12 hr tablet Take 1 tablet (600 mg total) by mouth 2 (two) times daily. 03/14/17   Gladstone Lighter, MD  ipratropium (ATROVENT) 0.02 % nebulizer solution Take 2.5 mLs (0.5 mg total) by nebulization every 6 (six) hours. 03/28/17   Hillary Bow, MD  ipratropium (ATROVENT) 0.06 % nasal spray Place 2 sprays into the nose 4 (four) times daily. 04/04/17 04/04/18  Laverle Hobby, MD  ipratropium-albuterol (DUONEB) 0.5-2.5 (3) MG/3ML SOLN Take 3 mLs by nebulization every 6 (six) hours as needed.    [provider]  isosorbide mononitrate (IMDUR) 30 MG 24 hr tablet Take 1 tablet (30 mg  total) by mouth daily. 03/14/17 03/14/18  Gladstone Lighter, MD  levalbuterol Penne Lash) 0.63 MG/3ML nebulizer solution Take 3 mLs (0.63 mg total) by nebulization every 4 (four) hours as needed for wheezing or shortness of breath. 03/28/17 03/28/18  Hillary Bow, MD  meclizine (ANTIVERT) 12.5 MG tablet Take 1-2 tablets by mouth 3 (three) times daily as needed. 01/05/17   [provider]  metoprolol tartrate (LOPRESSOR) 25 MG tablet Take 0.5 tablets (12.5 mg total) by mouth 2 (two) times daily. 03/23/17   Epifanio Lesches, MD  nitroGLYCERIN (NITROSTAT) 0.4 MG SL tablet Place 1 tablet (0.4 mg total) under the tongue every 5 (five) minutes as needed for chest pain. 05/09/16   Wende Bushy, MD  omeprazole (PRILOSEC) 40 MG capsule Take 40 mg by mouth daily.    [provider]  PARoxetine (PAXIL) 30 MG tablet Take 30 mg by mouth daily.     [provider]  Tiotropium Bromide Monohydrate (SPIRIVA RESPIMAT) 1.25 MCG/ACT AERS Inhale 2 puffs into the lungs 2 (two) times daily. 07/11/16   Flora Lipps, MD  traMADol (ULTRAM) 50 MG tablet Take 1-2 tablets (50-100 mg total) by mouth every 4 (four) hours as needed for moderate pain. Patient taking differently: Take 50 mg by mouth every 6 (six) hours as needed for moderate pain.  01/16/16   Duanne Guess, PA-C    Allergies Patient has no known allergies.  Family History  Problem Relation Age of Onset  . Cancer Mother        'male cancer"  . Hypertension Mother   . Hyperlipidemia Mother   . Heart disease Father   . Heart attack Father   . Hypertension Father   . Hyperlipidemia Father     Social History Social History   Tobacco Use  . Smoking status: Former Smoker    Packs/day: 1.00    Years: 30.00    Pack years: 30.00    Types: Cigarettes    Last attempt to quit: 07/26/2008    Years since quitting: 8.7  . Smokeless tobacco: Never Used  Substance Use Topics  . Alcohol use: No  . Drug use: No    Review of  Systems  Constitutional: Negative for fever. Cardiovascular: Negative for chest pain. Respiratory: + shortness of breath and cough Gastrointestinal: Negative for abdominal pain, vomiting or diarrhea. Genitourinary: Negative for dysuria. Musculoskeletal:  Negative for back pain.  Level 5 caveat:  Portions of the history and physical were unable to be obtained due to respiratory distress  ____________________________________________   PHYSICAL EXAM:  VITAL SIGNS: ED Triage Vitals [04/17/17 1248]  Enc Vitals Group     BP (!) 190/118     Pulse Rate 73     Resp (!) 27     Temp      Temp src      SpO2 97 %     Weight 200 lb (90.7 kg)     Height 5\' 9"  (1.753 m)     Head Circumference      Peak Flow      Pain Score 0     Pain Loc      Pain Edu?      Excl. in [place]?     Constitutional: alert and oriented and in severe respiratory distress HEENT:      Head: Normocephalic and atraumatic.         Eyes: Conjunctivae are normal. Sclera is non-icteric.       Mouth/Throat: Mucous membranes are dry.       Neck: Supple with no signs of meningismus. Cardiovascular: Regular rate and rhythm. No murmurs, gallops, or rubs. 2+ symmetrical distal pulses are present in all extremities. No JVD. Respiratory: . The respiratory distress, decreased air movement with bilateral wheezes and faint crackles Gastrointestinal: Soft, non tender, and non distended with positive bowel sounds. No rebound or guarding. Musculoskeletal: Nontender with normal range of motion in all extremities. No edema, cyanosis, or erythema of extremities. Neurologic: Normal speech and language. Face is symmetric. Moving all extremities. No gross focal neurologic deficits are appreciated. Skin: Skin is warm, dry and intact. No rash noted. Psychiatric: Mood and affect are normal. Speech and behavior are normal.  ____________________________________________   LABS (all labs ordered are listed, but only abnormal results are  displayed)  Labs Reviewed  BRAIN NATRIURETIC PEPTIDE - Abnormal; Notable for the following components:      Result Value   B Natriuretic Peptide 1,575.0 (*)    All other components within normal limits  COMPREHENSIVE METABOLIC PANEL - Abnormal; Notable for the following components:   Glucose, Bld 227 (*)    Creatinine, Ser 1.27 (*)    Calcium 8.2 (*)    Albumin 3.1 (*)    AST 73 (*)    GFR calc non Af Amer 55 (*)    All other components within normal limits  TROPONIN I - Abnormal; Notable for the following components:   Troponin I 0.04 (*)    All other components within normal limits  CBC WITH DIFFERENTIAL/PLATELET - Abnormal; Notable for the following components:   RBC 4.03 (*)    Hemoglobin 11.8 (*)    HCT 36.4 (*)    RDW 16.2 (*)    All other components within normal limits  BLOOD GAS, VENOUS - Abnormal; Notable for the following components:   pH, Ven 7.13 (*)    pCO2, Ven 85 (*)    pO2, Ven 55.0 (*)    Bicarbonate 28.3 (*)    Acid-base deficit 2.8 (*)    All other components within normal limits   ____________________________________________  EKG  ED ECG REPORT I, Rudene Re, the attending physician, personally viewed and interpreted this ECG.  Normal sinus rhythm, rate of 72, left bundle branch block, normal QTC, normal axis, ST depressions in 2 and aVF with T-wave inversions in lateral leads, no ST elevation. unchanged from  prior ____________________________________________  RADIOLOGY  I have personally reviewed the images performed during this visit and I agree with the Radiologist's read.   Interpretation by Radiologist:  Dg Chest Portable 1 View  Result Date: 04/17/2017 CLINICAL DATA:  Respiratory distress Asthma, COPD, CAD, h/o MI (2015), h/o prostate cancer, a-flutter EXAM: PORTABLE CHEST 1 VIEW COMPARISON:  03/25/2017 FINDINGS: Status post median sternotomy and CABG. LEFT-sided transvenous pacemaker leads to the RIGHT atrium and RIGHT ventricle.  Heart size is normal accounting for patient position. There are interstitial Kerley B-lines consistent with interstitial edema. Mild confluent edema at the bases. IMPRESSION: Pulmonary edema. Electronically Signed   By: Nolon Nations M.D.   On: 04/17/2017 13:23   ___________________________________________   PROCEDURES  Procedure(s) performed: None Procedures Critical Care performed: yes  CRITICAL CARE Performed by: Rudene Re  ?  Total critical care time: 40 min  Critical care time was exclusive of separately billable procedures and treating other patients.  Critical care was necessary to treat or prevent imminent or life-threatening deterioration.  Critical care was time spent personally by me on the following activities: development of treatment plan with patient and/or surrogate as well as nursing, discussions with consultants, evaluation of patient's response to treatment, examination of patient, obtaining history from patient or surrogate, ordering and performing treatments and interventions, ordering and review of laboratory studies, ordering and review of radiographic studies, pulse oximetry and re-evaluation of patient's condition.  ____________________________________________   INITIAL IMPRESSION / ASSESSMENT AND PLAN / ED COURSE  72 y.o. male with a history of CAD, COPD, CHF (EF 35-40%)who presents for evaluation of respiratory distress.history is limited due to severe respiratory distress. patient arrives in severe respiratory distress on CPAP. Presentation concerning for flash pulmonary edema and COPD exacerbation. He was placed immediately on BiPAP on 60% oxygen with sats in the upper 90s. Given duoneb x 3. Solumedrol and nitro given. Chest x-ray with crackles, wheezing, and decreased air movement. He was given one sublingual nitroglycerin for hypertension and concerns of pulmonary edema. Chest x-ray confirms pulmonary edema. No infiltrate. labs showing pH of 7.1  with a PCO2 of 85, BNP is elevated at 1575 (557 last month). New AKI. Patient will be admitted to the Hospitalist service.       As part of my medical decision making, I reviewed the following data within the [place] notes reviewed and incorporated, Labs reviewed , EKG interpreted , Old EKG reviewed, Old chart reviewed, Radiograph reviewed , Discussed with admitting physician , Notes from prior ED visits and [place] Controlled Substance Database    Pertinent labs & imaging results that were available during my care of the patient were reviewed by me and considered in my medical decision making (see chart for details).    ____________________________________________   FINAL CLINICAL IMPRESSION(S) / ED DIAGNOSES  Final diagnoses:  Acute respiratory failure with hypoxia and hypercapnia (HCC)  Flash pulmonary edema (HCC)  COPD exacerbation (HCC)  AKI (acute kidney injury) ([place])      NEW MEDICATIONS STARTED DURING THIS VISIT:  ED Discharge Orders    None       Note:  This document was prepared using Dragon voice recognition software and may include unintentional dictation errors.    Alfred Levins, [place], MD 04/17/17 1351

## 2017-04-17 NOTE — ED Notes (Signed)
Date and time results received: 04/17/17 1332  Test: troponin I Critical Value: 0.04  Name of Provider Notified: Alfred Levins  Orders Received? Or Actions Taken?: no new orders at this time

## 2017-04-17 NOTE — ED Notes (Signed)
Pt states he self-caths at home d/t hx prostate cancer. EDP [place] notified, ok to place foley at this time.

## 2017-04-18 ENCOUNTER — Inpatient Hospital Stay: Payer: Medicare Other

## 2017-04-18 LAB — BASIC METABOLIC PANEL
ANION GAP: 10 (ref 5–15)
BUN: 24 mg/dL — AB (ref 6–20)
CHLORIDE: 100 mmol/L — AB (ref 101–111)
CO2: 27 mmol/L (ref 22–32)
Calcium: 8.4 mg/dL — ABNORMAL LOW (ref 8.9–10.3)
Creatinine, Ser: 1.05 mg/dL (ref 0.61–1.24)
GFR calc Af Amer: >60 mL/min (ref >60)
GFR calc non Af Amer: >60 mL/min (ref >60)
GLUCOSE: 211 mg/dL — AB (ref 65–99)
POTASSIUM: 3.7 mmol/L (ref 3.5–5.1)
Sodium: 137 mmol/L (ref 135–145)

## 2017-04-18 LAB — MAGNESIUM: Magnesium: 1.9 mg/dL (ref 1.7–2.4)

## 2017-04-18 LAB — CBC
HEMATOCRIT: 32.5 % — AB (ref 40.0–52.0)
HEMOGLOBIN: 10.8 g/dL — AB (ref 13.0–18.0)
MCH: 29.1 pg (ref 26.0–34.0)
MCHC: 33.4 g/dL (ref 32.0–36.0)
MCV: 87.2 fL (ref 80.0–100.0)
Platelets: 271 10*3/uL (ref 150–440)
RBC: 3.72 MIL/uL — AB (ref 4.40–5.90)
RDW: 15.2 % — ABNORMAL HIGH (ref 11.5–14.5)
WBC: 6.3 10*3/uL (ref 3.8–10.6)

## 2017-04-18 LAB — TROPONIN I: Troponin I: 0.06 ng/mL (ref <0.03)

## 2017-04-18 LAB — PROCALCITONIN

## 2017-04-18 LAB — PHOSPHORUS: Phosphorus: 3.1 mg/dL (ref 2.5–4.6)

## 2017-04-18 MED ORDER — PREMIER PROTEIN SHAKE
11.0000 [oz_av] | Freq: Two times a day (BID) | ORAL | Status: DC
  Administered 2017-04-18 – 2017-04-22 (×3): 11 [oz_av] via ORAL

## 2017-04-18 MED ORDER — GUAIFENESIN-CODEINE 100-10 MG/5ML PO SOLN
10.0000 mL | Freq: Four times a day (QID) | ORAL | Status: DC | PRN
Start: 2017-04-18 — End: 2017-04-22
  Filled 2017-04-18: qty 10

## 2017-04-18 NOTE — Progress Notes (Signed)
Chaplain met with patient and discussed multiple hospital stays since January, especially his recent bypass surgery. Patient misses his wife and their separation is a source of sadness. Patient is exhausted from his health issues and concern for his disabled wife. Chaplain provided active listening, emotional support, and prayer.

## 2017-04-18 NOTE — Progress Notes (Signed)
Patient ID: Donald Juarez, male   DOB: November 21, 1945, 72 y.o.   MRN: [id number]  [employer] PROGRESS NOTE  Donald Juarez:[id number] DOB: May 05, 1945 DOA: 04/17/2017 PCP: Sofie Hartigan, MD  HPI/Subjective: Patient still not breathing well.  Still with cough and shortness of breath.  Objective: Vitals:   04/18/17 1300 04/18/17 1400  BP: 127/75 105/70  Pulse:  (!) 134  Resp: 16 20  Temp:    SpO2:  (!) 77%    Filed Weights   04/17/17 1248 04/17/17 1616 04/18/17 0458  Weight: 90.7 kg (200 lb) 98.3 kg (216 lb 11.4 oz) 92.4 kg (203 lb 11.3 oz)    ROS: Review of Systems  Constitutional: Negative for chills and fever.  Eyes: Negative for blurred vision.  Respiratory: Positive for cough and shortness of breath.   Cardiovascular: Positive for chest pain.  Gastrointestinal: Positive for abdominal pain. Negative for constipation, diarrhea, nausea and vomiting.  Genitourinary: Negative for dysuria.  Musculoskeletal: Negative for joint pain.  Neurological: Negative for dizziness and headaches.   Exam: Physical Exam  Constitutional: He is oriented to person, place, and time.  HENT:  Nose: No mucosal edema.  Mouth/Throat: No oropharyngeal exudate or posterior oropharyngeal edema.  Eyes: Conjunctivae, EOM and lids are normal. Pupils are equal, round, and reactive to light.  Neck: No JVD present. Carotid bruit is not present. No edema present. No thyroid mass and no thyromegaly present.  Cardiovascular: S1 normal and S2 normal. Exam reveals no gallop.  No murmur heard. Respiratory: No respiratory distress. He has decreased breath sounds in the right lower field and the left lower field. He has wheezes in the right middle field, the right lower field, the left middle field and the left lower field. He has no rhonchi. He has no rales.  GI: Soft. Bowel sounds are normal. There is no tenderness.  Musculoskeletal:       Right ankle: He exhibits no swelling.       Left ankle: He  exhibits no swelling.  Lymphadenopathy:    He has no cervical adenopathy.  Neurological: He is alert and oriented to person, place, and time. No cranial nerve deficit.  Skin: Skin is warm. No rash noted. Nails show no clubbing.  Psychiatric: He has a normal mood and affect.      Data Reviewed: Basic Metabolic Panel: Recent Labs  Lab 04/17/17 1300 04/18/17 0345  NA 138 137  K 4.3 3.7  CL 103 100*  CO2 24 27  GLUCOSE 227* 211*  BUN 19 24*  CREATININE 1.27* 1.05  CALCIUM 8.2* 8.4*  MG  --  1.9  PHOS  --  3.1   Liver Function Tests: Recent Labs  Lab 04/17/17 1300  AST 73*  ALT 42  ALKPHOS 121  BILITOT 0.5  PROT 7.0  ALBUMIN 3.1*   CBC: Recent Labs  Lab 04/17/17 1300 04/18/17 0345  WBC 7.9 6.3  NEUTROABS 5.7  --   HGB 11.8* 10.8*  HCT 36.4* 32.5*  MCV 90.2 87.2  PLT 354 271   Cardiac Enzymes: Recent Labs  Lab 04/17/17 1300 04/17/17 1630 04/17/17 2205 04/18/17 0345  TROPONINI 0.04* 0.07* 0.06* 0.06*   BNP (last 3 results) Recent Labs    03/12/17 0945 03/19/17 0452 04/17/17 1300  BNP 536.0* 557.0* 1,575.0*     CBG: Recent Labs  Lab 04/17/17 1616  GLUCAP 131*     Studies: Dg Chest Port 1 View  Result Date: 04/18/2017 CLINICAL DATA:  Congestive heart failure  EXAM: PORTABLE CHEST 1 VIEW COMPARISON:  April 17, 2017 in March 25, 2017 FINDINGS: There is trace interstitial edema. There are small pleural effusions bilaterally with bibasilar atelectasis. There is patchy airspace consolidation in the right base. There is cardiomegaly with pulmonary venous hypertension. Pacemaker leads are attached the right atrium and right ventricle. Patient is status post coronary artery bypass grafting. No adenopathy. No bone lesions. IMPRESSION: Pulmonary vascular congestion with trace interstitial edema and small pleural effusions bilaterally. Patchy airspace consolidation right base raises question of superimposed pneumonia in this area. No adenopathy  appreciable. Electronically Signed   By: Lowella Grip III M.D.   On: 04/18/2017 08:06   Dg Chest Portable 1 View  Result Date: 04/17/2017 CLINICAL DATA:  Respiratory distress Asthma, COPD, CAD, h/o MI (2015), h/o prostate cancer, a-flutter EXAM: PORTABLE CHEST 1 VIEW COMPARISON:  03/25/2017 FINDINGS: Status post median sternotomy and CABG. LEFT-sided transvenous pacemaker leads to the RIGHT atrium and RIGHT ventricle. Heart size is normal accounting for patient position. There are interstitial Kerley B-lines consistent with interstitial edema. Mild confluent edema at the bases. IMPRESSION: Pulmonary edema. Electronically Signed   By: Nolon Nations M.D.   On: 04/17/2017 13:23    Scheduled Meds: . amiodarone  200 mg Oral Daily  . apixaban  5 mg Oral BID  . arformoterol  15 mcg Nebulization BID  . aspirin EC  81 mg Oral Daily  . atorvastatin  80 mg Oral Daily  . budesonide  0.5 mg Nebulization BID  . cholecalciferol  2,000 Units Oral Daily  . furosemide  40 mg Intravenous Q12H  . ipratropium  0.5 mg Nebulization Q6H  . isosorbide mononitrate  30 mg Oral Daily  . mouth rinse  15 mL Mouth Rinse BID  . metoprolol tartrate  12.5 mg Oral BID  . pantoprazole  80 mg Oral Daily  . PARoxetine  30 mg Oral Daily  . protein supplement shake  11 oz Oral BID BM  . sodium chloride flush  3 mL Intravenous Q12H  . tiotropium  1 capsule Inhalation Daily   Continuous Infusions: . sodium chloride      Assessment/Plan:  1. Acute hypoxic respiratory failure.  Patient initially required BiPAP.  When I saw him he was on nasal cannula.  Looking at the most recent vital sign patient may end up back on the BiPAP. 2. Acute systolic congestive heart failure.  Patient on Lasix 40 mg IV twice daily, low-dose metoprolol.  Since kidney function is better may end up being able to start low-dose ACE inhibitor and Spironolactone. 3. Atrial fibrillation on Eliquis metoprolol and amiodarone for rate control for  anticoagulation.  4. COPD exacerbation.  Continue nebulizer treatments.  Recommend steroids. 5. Hyperlipidemia unspecified on atorvastatin 6. Depression on Paxil 7. GERD on Protonix 8. Borderline elevated troponin demand ischemia from acute hypoxic respiratory failure 9. Fourth admit in 2 months.  Consider palliative care consultation.  Will need a better outpatient plan to keep this patient out of the hospital.  Code Status:     Code Status Orders  (From admission, onward)        Start     Ordered   04/17/17 1610  Full code  Continuous     04/17/17 1610    Code Status History    Date Active Date Inactive Code Status Order ID Comments User Context   03/25/2017 08:23 03/28/2017 17:32 Full Code [id number]  Hillary Bow, MD ED   03/19/2017 08:29 03/23/2017 15:02 Full Code [id number]  Epifanio Lesches, MD ED   03/12/2017 11:35 03/14/2017 16:53 Full Code [id number]  Dustin Flock, MD ED   02/07/2017 19:27 02/10/2017 08:25 Full Code [id number]  Nicholes Mango, MD Inpatient   01/14/2016 11:22 01/17/2016 16:59 Full Code [id number]  [name], MD Inpatient   11/03/2014 16:40 11/04/2014 13:57 Full Code [id number]  [name], MD Inpatient   11/02/2014 13:33 11/03/2014 16:40 Full Code [id number]  [name], MD Inpatient   10/31/2013 12:08 11/01/2013 01:02 Full Code [id number]  [name], MD Inpatient     Family Communication: As per critical care specialist Disposition Plan: To be determined  Consultants:  Critical care specialist  Time spent: 25 minutes  [place]

## 2017-04-18 NOTE — Progress Notes (Signed)
Initial Nutrition Assessment  DOCUMENTATION CODES:   Obesity unspecified  INTERVENTION:  Provide Premier Protein po BID, each supplement provides 160 kcal and 30 grams of protein. Patient prefers vanilla.  Reviewed "Heart Failure Nutrition Therapy for the Undernourished" handout from the Academy of Nutrition and Dietetics with patient. Also provided patient with list of community nutrition resources available in [place].  NUTRITION DIAGNOSIS:   Inadequate oral intake related to decreased appetite, social / environmental circumstances as evidenced by per patient/family report.  GOAL:   Patient will meet greater than or equal to 90% of their needs  MONITOR:   PO intake, Supplement acceptance, Labs, Weight trends, I & O's  REASON FOR ASSESSMENT:   Malnutrition Screening Tool    ASSESSMENT:   72 year old male with PMHx of COPD, carotid stenosis, HTN, paroxysmal atrial flutter, CAD, HLD, shingles, hx MI, anxiety, seizures, hx rectal cancer s/p colostomy, hx prostate cancer admitted with acute systolic CHF.   Met with patient at bedside. He reports he has had a decreased appetite since he had a CABG a couple months ago. He reports that since then he has been in and out of the hospital. When he is at home he is too tired to prepare a good meal for himself. He is also struggling with cooking for just one because his wife used to live at home with him and he was her caregiver. She recently had to move to a SNF and patient became very emotional about this. He reports that caring for her gave him something to do every day and he really enjoyed caring for her. He also feels upset because he had always told her he would never put her in a SNF. Provided active listening for patient. He has two sons but they do not live nearby (the closest lives in [place]). He has had his colostomy since 2003 (had cancer of his rectum). He reports output has been normal and he empties it 1-2 times daily.  Denies any difficulty chewing/swallowing or any N/V or abdominal pain. He is edentulous and left his dentures at home. He does not want his diet downgraded because he reports he will just cut up the food himself.  Patient reports UBW was around 215-220 lbs. Per chart he was 219.8 lbs on 02/07/2017 and has lost 16.1 lbs (7.3% body weight) over 2 months, which is significant for time frame.  Medications reviewed and include: amiodarone, Eliquis, vitamin D 2000 units daily, Lasix 40 mg Q12hrs IV, pantoprazole.  Labs reviewed: CBG 131, Chloride 100, BUN 24.  Patient is at risk for malnutrition.  Discussed with RN and on rounds.  NUTRITION - FOCUSED PHYSICAL EXAM:    Most Recent Value  Orbital Region  No depletion  Upper Arm Region  Mild depletion  Thoracic and Lumbar Region  No depletion  Buccal Region  No depletion  Temple Region  No depletion  Clavicle Bone Region  No depletion  Clavicle and Acromion Bone Region  No depletion  Scapular Bone Region  No depletion  Dorsal Hand  No depletion  Patellar Region  Mild depletion  Anterior Thigh Region  Mild depletion  Posterior Calf Region  Moderate depletion  Edema (RD Assessment)  None  Hair  Reviewed  Eyes  Reviewed  Mouth  Reviewed  Skin  Reviewed [dry skin,  ecchymosis to arms]  Nails  Reviewed     Diet Order:  Diet Heart Room service appropriate? Yes; Fluid consistency: Thin  EDUCATION NEEDS:   Education  needs have been addressed  Skin:  Skin Assessment: Reviewed RN Assessment(ecchymosis bilateral arms)  Last BM:  Unknown/PTA; pt has had colostomy since 2003  Height:   Ht Readings from Last 1 Encounters:  04/17/17 5' 9"  (1.753 m)    Weight:   Wt Readings from Last 1 Encounters:  04/18/17 203 lb 11.3 oz (92.4 kg)    Ideal Body Weight:  72.7 kg  BMI:  Body mass index is 30.08 kg/m.  Estimated Nutritional Needs:   Kcal:  2000-2170 (MSJ x 1.2-1.3)  Protein:  90-110 grams (1-1.2 grams/kg)  Fluid:  1.8 L/day (25  mL/kg IBW)  Willey Blade, MS, RD, LDN Office: [phone] Pager: [phone] After Hours/Weekend Pager: [phone]

## 2017-04-19 DIAGNOSIS — R338 Other retention of urine: Secondary | ICD-10-CM

## 2017-04-19 DIAGNOSIS — Z466 Encounter for fitting and adjustment of urinary device: Secondary | ICD-10-CM

## 2017-04-19 MED ORDER — TRAZODONE HCL 100 MG PO TABS
100.0000 mg | ORAL_TABLET | Freq: Every day | ORAL | Status: DC
  Administered 2017-04-19 – 2017-04-21 (×3): 100 mg via ORAL
  Filled 2017-04-19 (×3): qty 1

## 2017-04-19 MED ORDER — LEVALBUTEROL HCL 1.25 MG/0.5ML IN NEBU
1.2500 mg | INHALATION_SOLUTION | Freq: Four times a day (QID) | RESPIRATORY_TRACT | Status: DC
  Administered 2017-04-19 – 2017-04-22 (×8): 1.25 mg via RESPIRATORY_TRACT
  Filled 2017-04-19 (×14): qty 0.5

## 2017-04-19 MED ORDER — SPIRONOLACTONE 25 MG PO TABS
12.5000 mg | ORAL_TABLET | Freq: Every day | ORAL | Status: DC
  Filled 2017-04-19 (×2): qty 1
  Filled 2017-04-19 (×2): qty 0.5

## 2017-04-19 MED ORDER — LISINOPRIL 5 MG PO TABS
5.0000 mg | ORAL_TABLET | Freq: Two times a day (BID) | ORAL | Status: DC
  Administered 2017-04-19: 5 mg via ORAL
  Filled 2017-04-19 (×3): qty 1

## 2017-04-19 MED ORDER — HALOPERIDOL LACTATE 5 MG/ML IJ SOLN
1.0000 mg | Freq: Four times a day (QID) | INTRAMUSCULAR | Status: DC | PRN
  Administered 2017-04-19: 1 mg via INTRAVENOUS
  Filled 2017-04-19: qty 1

## 2017-04-19 MED ORDER — PREDNISONE 20 MG PO TABS
40.0000 mg | ORAL_TABLET | Freq: Every day | ORAL | Status: DC
  Administered 2017-04-19 – 2017-04-21 (×3): 40 mg via ORAL
  Filled 2017-04-19 (×3): qty 2

## 2017-04-19 NOTE — Progress Notes (Signed)
Patient unable to take medications that were scheduled earlier during the day. Re attempted to give this medications, but patient refused his lisinopril and spironolactone that was scheduled earlier during the day. He agreed to take the furosemide 40mg  IV.  At this time he is more calm laying in the bed watching tv. Will continue to monitor patient.

## 2017-04-19 NOTE — Consult Note (Signed)
04/19/2017 4:00 PM   Donald Juarez 05-Feb-1945 [id number]  Referring provider: Dr. Karlton Lemon  CC: Traumatic foley  HPI: The patient is a 72 year old gentleman who has multiple medical comorbidities who was admitted to the hospital with acute respiratory failure.  Urology was consulted when the patient traumatically pulled the Foley into his prostate.  The patient is confused at this point unable to provide much of a history.  From review of care everywhere, it does appear that he perform CIC at home at baseline.  A Foley was placed due to the need for diuresis.  The Foley was then tugged on with the patient was walking today when he was confused.  The nurses remove the Foley bag and placed a rubber band at the tip of the catheter due to bleeding.  The patient is not able to provide any further history.  On arrival though it is apparent based on the amount of catheter hanging out of the patient's penis that the balloon was in his prostatic urethra.  The patient is on Eliquis for his multiple comorbidities.  PMH: Past Medical History:  Diagnosis Date  . Anginal pain ([place])    feels this as jaw pain.  takes imdur for this. rarely uses nitro.  [name] Anxiety   . Arthritis   . Asthma   . Bilateral claudication of lower limb ([place])   . Carotid stenosis    a. 07/2013 Carotid U/S: 100% RICA (pt prev reported h/o 40%), LICA 98-11%, bilat ECA 50%, normal vertebrals and subclavians bilat-->F/U needed in 01/2014.  . Cataract of left eye   . Cataract, right    a. 10/2010 s/p cataract surgery   . COPD (chronic obstructive pulmonary disease) ([place])   . Coronary artery disease    a. 06/1996 & 03/2005 Cath: non-obs dzs;  b. 07/2009 NSTEMI/Cath: LCX 99->PCI/DES extending into OM1;  c. 01/2013 Cath: patent LCX stent-->Med Rx.; d. cath 03/2014: RPAV dzs->Med rx; e. 10/2014 Cath/PCI: LM 30, LAD 20p/m, LCX 50ost/p, 10 ISR, 60d, OM1 min irregs, OM2 50, RCA 30ost, 49m, 60d, RPDA 70(2.5x8 Xience DES), RPAV 90small(PTCA);  f. 10/2014 Relook Cath: RPAV 99(Med Rx), EF 45-50.  [name] Dysrhythmia    since ablation, flutter resolved.  . Essential hypertension   . Hyperkalemia   . Hyperlipidemia   . Myocardial infarction ([place]) 2015   had stent inserted and then had heart attack after. stent inserted due to blockage  . Paroxysmal atrial flutter ([place])    a. CHA2DS2VASc = 3 -> eliquis;  b. 07/2013 Echo: EF 50-55%, mildly dil LA.c. s/p RFCA 10/31/2013 by Dr Caryl Comes  . Prostate cancer ([place])   . Pulmonary nodule, right    a. 0.7cm RLL - stable by CT 06/2013.  [name] Rectal cancer ([place])    a. Status post colostomy  . Seizures ([place])    a. last 30 years ago  . Shingles    a. 09/2012.  . SSS (sick sinus syndrome) ([place])    a. 12/2009 S/P MDT Adapta ADDR01 DC PPM, ser # [id number] ([name]).  . Syncope and collapse     Surgical History: Past Surgical History:  Procedure Laterality Date  . ABLATION  10/31/2013   CTI ablation by Dr Caryl Comes for atrial flutter  . ATRIAL FLUTTER ABLATION N/A 10/31/2013   Procedure: ATRIAL FLUTTER ABLATION;  Surgeon: [name], MD;  Location: [facility] CATH LAB;  Service: Cardiovascular;  Laterality: N/A;  . CARDIAC CATHETERIZATION  07/2009   [facility]  . CARDIAC CATHETERIZATION  01/2013   [facility]  . CARDIAC CATHETERIZATION  03/2014   [facility]  . CARDIAC CATHETERIZATION N/A 11/02/2014   Procedure: Left Heart Cath and Cors/Grafts Angiography;  Surgeon: [name], MD;  Location: [facility] CV LAB;  Service: Cardiovascular;  Laterality: N/A;  . CARDIAC CATHETERIZATION N/A 11/02/2014   Procedure: Coronary Stent Intervention;  Surgeon: [name], MD;  Location: [facility] CV LAB;  Service: Cardiovascular;  Laterality: N/A;  . CARDIAC CATHETERIZATION N/A 11/03/2014   Procedure: Left Heart Cath and Coronary Angiography;  Surgeon: [name], MD;  Location: [facility] [facility] CV LAB;  Service: Cardiovascular;  Laterality: N/A;  . CARDIAC SURGERY  2011   Stents placed   . CATARACT EXTRACTION Left   .  CATARACT EXTRACTION W/PHACO Left 06/01/2014   Procedure: CATARACT EXTRACTION PHACO AND INTRAOCULAR LENS PLACEMENT (IOC);  Surgeon: [name], MD;  Location: [name] ORS;  Service: Ophthalmology;  Laterality: Left;  . cataract surgery  2012  . colon surgery     cancer removal  . COLON SURGERY     colostomy  . COLONOSCOPY WITH PROPOFOL N/A 05/24/2016   Procedure: COLONOSCOPY WITH PROPOFOL;  Surgeon: [name], MD;  Location: [facility] ENDOSCOPY;  Service: Endoscopy;  Laterality: N/A;  . CORONARY ANGIOPLASTY    . INSERT / REPLACE / REMOVE PACEMAKER  2011   just a pacer  . KNEE ARTHROSCOPY Right 1980's  . LEFT HEART CATH AND CORONARY ANGIOGRAPHY N/A 02/08/2017   Procedure: LEFT HEART CATH AND CORONARY ANGIOGRAPHY;  Surgeon: [name], MD;  Location: [place] CV LAB;  Service: Cardiovascular;  Laterality: N/A;  . PACEMAKER PLACEMENT  01/17/2010  . TOTAL KNEE ARTHROPLASTY Right 01/14/2016   Procedure: TOTAL KNEE ARTHROPLASTY;  Surgeon: [name], MD;  Location: [name] ORS;  Service: Orthopedics;  Laterality: Right;     Allergies:  Allergies  Allergen Reactions  . Other     Sleep medications cause hallucinations, agitation per patient's son. Did not know which one.    Family History: Family History  Problem Relation Age of Onset  . Cancer Mother        'male cancer"  . Hypertension Mother   . Hyperlipidemia Mother   . Heart disease Father   . Heart attack Father   . Hypertension Father   . Hyperlipidemia Father     Social History:  reports that he quit smoking about 8 years ago. His smoking use included cigarettes. He has a 30.00 pack-year smoking history. He has never used smokeless tobacco. He reports that he does not drink alcohol or use drugs.  ROS: Unable to obtain due to patient factors  Physical Exam: BP 122/81 (BP Location: Left Arm)   Pulse 97   Temp (!) 97.5 F (36.4 C) (Oral)   Resp 20   Ht 5\' 9"  (1.753 m)   Wt 201 lb 3.2 oz (91.3 kg)    SpO2 97%   BMI 29.71 kg/m   Constitutional:  Alert and oriented, No acute distress. HEENT: [place] AT, moist mucus membranes.  Trachea midline, no masses. Cardiovascular: No clubbing, cyanosis, or edema. Respiratory: Normal respiratory effort, no increased work of breathing. GI: Abdomen is soft, nontender, nondistended, no abdominal masses GU: No CVA tenderness.  Foley catheter in the correct location within the bladder at the end of the manipulation.  It was initially likely in the prostatic urethra. Skin: No rashes, bruises or suspicious lesions. Lymph: No cervical or inguinal adenopathy. Neurologic: Grossly intact, no focal deficits, moving all  4 extremities. Psychiatric: Normal mood and affect.  Laboratory Data: Lab Results  Component Value Date   WBC 6.3 04/18/2017   HGB 10.8 (L) 04/18/2017   HCT 32.5 (L) 04/18/2017   MCV 87.2 04/18/2017   PLT 271 04/18/2017    Lab Results  Component Value Date   CREATININE 1.05 04/18/2017    No results found for: PSA  No results found for: TESTOSTERONE  No results found for: HGBA1C  Urinalysis    Component Value Date/Time   COLORURINE YELLOW (A) 03/22/2017 1450   APPEARANCEUR CLEAR (A) 03/22/2017 1450   APPEARANCEUR Hazy 05/11/2014 0047   LABSPEC 1.014 03/22/2017 1450   LABSPEC 1.015 05/11/2014 0047   PHURINE 6.0 03/22/2017 1450   GLUCOSEU NEGATIVE 03/22/2017 1450   GLUCOSEU 150 mg/dL 05/11/2014 0047   HGBUR SMALL (A) 03/22/2017 1450   BILIRUBINUR NEGATIVE 03/22/2017 1450   BILIRUBINUR Negative 05/11/2014 0047   KETONESUR NEGATIVE 03/22/2017 1450   PROTEINUR NEGATIVE 03/22/2017 1450   NITRITE NEGATIVE 03/22/2017 1450   LEUKOCYTESUR NEGATIVE 03/22/2017 1450   LEUKOCYTESUR 3+ 05/11/2014 0047   Procedure: The patient's 29 French Foley catheter is balloon was dropped lubrication was placed at the urethral meatus.  The catheter was then advanced into the bladder with return of clear red urine.  A few small clots were manually  irrigated out bedside.  The urine then cleared to a light pink to red color.  No further clots were able to be evacuated.  Assessment & Plan:    1.  Traumatic Foley 2.  Urinary retention The patient will need to keep his Foley catheter for 1 week due to his traumatic Foley event.  It may need to be manually irrigated if it clogs to any residual clot.  There may also be some bleeding from the patient's urethral meatus  which is to be expected.  I do recommend that he has a sitter at this time so this does not happen again.  He can follow-up with his outside urologist for trial of voiding and resuming CIC upon discharge.  No follow-ups on file.  Nickie Retort, MD

## 2017-04-19 NOTE — Care Management (Signed)
This is patient's 4th admission since February 2019.  have reached out to [facility] to determine if agency was able to initiate visits because agency was not able to reach patient after some admissions.  Have confirmed working phone numbers.  Patient just transferred out of ICU to 2A this morning.  CM was going to speak with patient but he was very agitated, raising his voice and pacing in the halls. He was concerned some of his money was gone but security showed him it was locked in the safe.  He is raising his voice stating he needs a ride home.  CM will speak with patient and his son if patient gives permission.

## 2017-04-19 NOTE — Progress Notes (Signed)
PT Cancellation Note  Patient Details Name: Donald Juarez MRN: [id number] DOB: Dec 03, 1945   Cancelled Treatment:    Reason Eval/Treat Not Completed: Other (comment).  Pt not appropriate due to mental status.  Pt outside room yelling, security has been called.  PT will re-attempt at a later date if pt is deemed appropriate.   [name], PT, DPT 04/19/2017, 3:22 PM

## 2017-04-19 NOTE — Care Management (Signed)
Found that patient is currently being followed by [facility].- SN PT Aide and social work.  Have asked for agency to see if can find out how care was managed, caregiver support, patient behaviors.

## 2017-04-19 NOTE — Progress Notes (Signed)
Patient ID: Donald Juarez, male   DOB: Nov 22, 1945, 72 y.o.   MRN: [id number]  [facility] PROGRESS NOTE  Donald Juarez QBH:[id number] DOB: 08/31/1945 DOA: 04/17/2017 PCP: Sofie Hartigan, MD  HPI/Subjective: Patient still not breathing well.  Patient still with cough and shortness of breath.  Objective: Vitals:   04/19/17 1035 04/19/17 1338  BP: 122/81   Pulse: 97   Resp: 20   Temp: (!) 97.5 F (36.4 C)   SpO2: 97% 97%    Filed Weights   04/18/17 0458 04/19/17 0500 04/19/17 1035  Weight: 92.4 kg (203 lb 11.3 oz) 91.4 kg (201 lb 8 oz) 91.3 kg (201 lb 3.2 oz)    ROS: Review of Systems  Constitutional: Negative for chills and fever.  Eyes: Negative for blurred vision.  Respiratory: Positive for cough and shortness of breath.   Cardiovascular: Positive for chest pain.  Gastrointestinal: Negative for abdominal pain, constipation, diarrhea, nausea and vomiting.  Genitourinary: Negative for dysuria.  Musculoskeletal: Negative for joint pain.  Neurological: Negative for dizziness and headaches.   Exam: Physical Exam  Constitutional: He is oriented to person, place, and time.  HENT:  Nose: No mucosal edema.  Mouth/Throat: No oropharyngeal exudate or posterior oropharyngeal edema.  Eyes: Pupils are equal, round, and reactive to light. Conjunctivae, EOM and lids are normal.  Neck: No JVD present. Carotid bruit is not present. No edema present. No thyroid mass and no thyromegaly present.  Cardiovascular: S1 normal and S2 normal. Exam reveals no gallop.  No murmur heard. Respiratory: No respiratory distress. He has decreased breath sounds in the right lower field and the left lower field. He has wheezes in the right middle field, the right lower field, the left middle field and the left lower field. He has no rhonchi. He has no rales.  GI: Soft. Bowel sounds are normal. There is no tenderness.  Musculoskeletal:       Right ankle: He exhibits no swelling.       Left ankle: He  exhibits no swelling.  Lymphadenopathy:    He has no cervical adenopathy.  Neurological: He is alert and oriented to person, place, and time. No cranial nerve deficit.  Skin: Skin is warm. No rash noted. Nails show no clubbing.  Psychiatric: He has a normal mood and affect.      Data Reviewed: Basic Metabolic Panel: Recent Labs  Lab 04/17/17 1300 04/18/17 0345  NA 138 137  K 4.3 3.7  CL 103 100*  CO2 24 27  GLUCOSE 227* 211*  BUN 19 24*  CREATININE 1.27* 1.05  CALCIUM 8.2* 8.4*  MG  --  1.9  PHOS  --  3.1   Liver Function Tests: Recent Labs  Lab 04/17/17 1300  AST 73*  ALT 42  ALKPHOS 121  BILITOT 0.5  PROT 7.0  ALBUMIN 3.1*   CBC: Recent Labs  Lab 04/17/17 1300 04/18/17 0345  WBC 7.9 6.3  NEUTROABS 5.7  --   HGB 11.8* 10.8*  HCT 36.4* 32.5*  MCV 90.2 87.2  PLT 354 271   Cardiac Enzymes: Recent Labs  Lab 04/17/17 1300 04/17/17 1630 04/17/17 2205 04/18/17 0345  TROPONINI 0.04* 0.07* 0.06* 0.06*   BNP (last 3 results) Recent Labs    03/12/17 0945 03/19/17 0452 04/17/17 1300  BNP 536.0* 557.0* 1,575.0*     CBG: Recent Labs  Lab 04/17/17 1616  GLUCAP 131*     Studies: Dg Chest Port 1 View  Result Date: 04/18/2017 CLINICAL DATA:  Congestive heart failure EXAM: PORTABLE CHEST 1 VIEW COMPARISON:  April 17, 2017 in March 25, 2017 FINDINGS: There is trace interstitial edema. There are small pleural effusions bilaterally with bibasilar atelectasis. There is patchy airspace consolidation in the right base. There is cardiomegaly with pulmonary venous hypertension. Pacemaker leads are attached the right atrium and right ventricle. Patient is status post coronary artery bypass grafting. No adenopathy. No bone lesions. IMPRESSION: Pulmonary vascular congestion with trace interstitial edema and small pleural effusions bilaterally. Patchy airspace consolidation right base raises question of superimposed pneumonia in this area. No adenopathy  appreciable. Electronically Signed   By: Lowella Grip III M.D.   On: 04/18/2017 08:06    Scheduled Meds: . amiodarone  200 mg Oral Daily  . apixaban  5 mg Oral BID  . arformoterol  15 mcg Nebulization BID  . aspirin EC  81 mg Oral Daily  . atorvastatin  80 mg Oral Daily  . budesonide  0.5 mg Nebulization BID  . cholecalciferol  2,000 Units Oral Daily  . furosemide  40 mg Intravenous Q12H  . ipratropium  0.5 mg Nebulization Q6H  . isosorbide mononitrate  30 mg Oral Daily  . lisinopril  5 mg Oral BID  . mouth rinse  15 mL Mouth Rinse BID  . metoprolol tartrate  12.5 mg Oral BID  . pantoprazole  80 mg Oral Daily  . PARoxetine  30 mg Oral Daily  . predniSONE  40 mg Oral Q breakfast  . protein supplement shake  11 oz Oral BID BM  . sodium chloride flush  3 mL Intravenous Q12H  . spironolactone  12.5 mg Oral Daily  . tiotropium  1 capsule Inhalation Daily   Continuous Infusions: . sodium chloride      Assessment/Plan:  1. Acute hypoxic respiratory failure.  Patient initially required BiPAP.  Patient when I saw him was on 4 L nasal cannula.  I dialed him down to 3 L nasal cannula.  Normally he wears 2 L nasal cannula.  2. Acute systolic congestive heart failure.  Patient on Lasix 40 mg IV twice daily, low-dose metoprolol,  low-dose lisinopril and Spironolactone. 3. Atrial fibrillation on Eliquis metoprolol and amiodarone for rate control for anticoagulation.  4. COPD exacerbation.  Start prednisone 40 mg daily.  On budesonide and Brovana, Xopenex and ipratropium nebulizers 5. Hyperlipidemia unspecified on atorvastatin 6. Depression on Paxil 7. GERD on Protonix 8. Borderline elevated troponin demand ischemia from acute hypoxic respiratory failure 9. Fourth admit in 2 months.  Patient will need close clinical follow-up with cardiology and CHF clinic as outpatient.   Code Status:     Code Status Orders  (From admission, onward)        Start     Ordered   04/17/17 1610   Full code  Continuous     04/17/17 1610    Code Status History    Date Active Date Inactive Code Status Order ID Comments User Context   03/25/2017 08:23 03/28/2017 17:32 Full Code [id number]  Hillary Bow, MD ED   03/19/2017 08:29 03/23/2017 15:02 Full Code [id number]  Epifanio Lesches, MD ED   03/12/2017 11:35 03/14/2017 16:53 Full Code [id number]  Dustin Flock, MD ED   02/07/2017 19:27 02/10/2017 08:25 Full Code [id number]  Nicholes Mango, MD Inpatient   01/14/2016 11:22 01/17/2016 16:59 Full Code [id number]  [name], MD Inpatient   11/03/2014 16:40 11/04/2014 13:57 Full Code [id number]  [name], MD Inpatient   11/02/2014 13:33 11/03/2014 16:40 Full  Code [id number]  [name], MD Inpatient   10/31/2013 12:08 11/01/2013 01:02 Full Code [id number]  [name], MD Inpatient     Disposition Plan: To be determined  Consultants:   consult cardiology  Time spent: 26 minutes  [place]

## 2017-04-19 NOTE — Progress Notes (Signed)
Was made aware patient was upset/angry, in hallway. Security contacted. Patient states he is missing his money. Sgt. Donald Juarez spoke with patient, found money secured in safe, reassured patient money was secured. Patient's son Donald Juarez [phone] had been contacted. He stated his father gets upset when he is given "sleeping medication" but did not know what type. States this happened when his father was in [facility] hospital. After Sgt. Donald Juarez informed patient money was secured, patient seemed calmer, sitting in chair in hallway. Informed son patient was calmer.

## 2017-04-19 NOTE — Progress Notes (Signed)
Urology was consulted. Urologist came in to fix the foley. Patient cooperative and a little more calm at the time. Will continue to monitor patient.

## 2017-04-19 NOTE — Progress Notes (Addendum)
As I walked into patient's room for rounding, patient was standing beside his chair, very agitated with student nurse at his side. Patient started walking out the door and as he did, he pulled on the foley. The foley bag came off and the tip of the foley started bleeding. Multiple nursing staff in the room trying to calm patient down. Patient started to get combative with multiple nursing staff trying to leave the room.Patient got out into the hallway with foley bag offt, blood coming out of the [place] foley. There was blood dripping from his foley from the trauma. MD Leslye Peer was paged as well as security. By the time security came patient was yelling trying to leave the unit with blood all over his legs. Security came and after a couple minutes he agreed to sit in a chair out in the hallway. MD called back, new orders put in. Will continue to monitor patient.

## 2017-04-19 NOTE — Consult Note (Signed)
Reason for Consult: Respiratory failure shortness of breath congestive heart failure Referring Physician: [facility] hospitalist Dr. Ellison Hughs primary Cardiologist Clayborn Bigness  Donald Juarez is an 72 y.o. male.  HPI: 72 year old male multiple medical problems congestive heart failure COPD coronary bypass surgery atrial fibrillation cardiomyopathy renal insufficiency multiple admissions for heart failure and COPD with shortness of breath present with history failure requiring BiPAP.  Patient is currently on Eliquis therapy for anticoagulation for A. fib patient had persistent dyspnea shortness of breath symptoms mild cough weakness fatigue.  Patient has had episodes of sick sinus syndrome and seizure disorder patient is also has some confusion and delusions.  Patient has had multiple ER visits and admission with shortness of breath and respiratory failure there is some concern about compliance.  Patient may need home health care as well as heart failure clinic follow-up  Past Medical History:  Diagnosis Date  . Anginal pain ([place])    feels this as jaw pain.  takes imdur for this. rarely uses nitro.  [name] Anxiety   . Arthritis   . Asthma   . Bilateral claudication of lower limb ([place])   . Carotid stenosis    a. 07/2013 Carotid U/S: 100% RICA (pt prev reported h/o 32%), LICA 20-25%, bilat ECA 50%, normal vertebrals and subclavians bilat-->F/U needed in 01/2014.  . Cataract of left eye   . Cataract, right    a. 10/2010 s/p cataract surgery   . COPD (chronic obstructive pulmonary disease) ([place])   . Coronary artery disease    a. 06/1996 & 03/2005 Cath: non-obs dzs;  b. 07/2009 NSTEMI/Cath: LCX 99->PCI/DES extending into OM1;  c. 01/2013 Cath: patent LCX stent-->Med Rx.; d. cath 03/2014: RPAV dzs->Med rx; e. 10/2014 Cath/PCI: LM 30, LAD 20p/m, LCX 50ost/p, 10 ISR, 60d, OM1 min irregs, OM2 50, RCA 30ost, 46m 60d, RPDA 70(2.5x8 Xience DES), RPAV 90small(PTCA); f. 10/2014 Relook Cath: RPAV 99(Med Rx), EF 45-50.  .[name] Dysrhythmia    since ablation, flutter resolved.  . Essential hypertension   . Hyperkalemia   . Hyperlipidemia   . Myocardial infarction ([place] 2015   had stent inserted and then had heart attack after. stent inserted due to blockage  . Paroxysmal atrial flutter ([place]    a. CHA2DS2VASc = 3 -> eliquis;  b. 07/2013 Echo: EF 50-55%, mildly dil LA.c. s/p RFCA 10/31/2013 by Dr KCaryl Comes . Prostate cancer ([place]   . Pulmonary nodule, right    a. 0.7cm RLL - stable by CT 06/2013.  .Marland KitchenRectal cancer ([place]    a. Status post colostomy  . Seizures ([place]    a. last 30 years ago  . Shingles    a. 09/2012.  . SSS (sick sinus syndrome) ([place]    a. 12/2009 S/P MDT Adapta ADDR01 DC PPM, ser # [id number] ([name]).  . Syncope and collapse     Past Surgical History:  Procedure Laterality Date  . ABLATION  10/31/2013   CTI ablation by Dr KCaryl Comesfor atrial flutter  . ATRIAL FLUTTER ABLATION N/A 10/31/2013   Procedure: ATRIAL FLUTTER ABLATION;  Surgeon: SDeboraha Sprang MD;  [facility];  Service: Cardiovascular;  Laterality: N/A;  . CARDIAC CATHETERIZATION  [facility]2009   [facility]  . CARDIAC CATHETERIZATION  01/2013   [facility]  . CARDIAC CATHETERIZATION  03/2014   [facility]  . CARDIAC CATHETERIZATION N/A 11/02/2014   Procedure: Left Heart Cath and Cors/Grafts Angiography;  Surgeon: MWellington Hampshire MD;  [facility];  Service: Cardiovascular;  Laterality:  N/A;  . CARDIAC CATHETERIZATION N/A 11/02/2014   Procedure: Coronary Stent Intervention;  Surgeon: [name], MD;  Location: [place] CV LAB;  Service: Cardiovascular;  Laterality: N/A;  . CARDIAC CATHETERIZATION N/A 11/03/2014   Procedure: Left Heart Cath and Coronary Angiography;  Surgeon: [name], MD;  Location: [place] CV LAB;  Service: Cardiovascular;  Laterality: N/A;  . CARDIAC SURGERY  2011   Stents placed   . CATARACT EXTRACTION Left   . CATARACT EXTRACTION W/PHACO Left 06/01/2014   Procedure: CATARACT EXTRACTION  PHACO AND INTRAOCULAR LENS PLACEMENT (IOC);  Surgeon: [name], MD;  Location: [name] ORS;  Service: Ophthalmology;  Laterality: Left;  . cataract surgery  2012  . colon surgery     cancer removal  . COLON SURGERY     colostomy  . COLONOSCOPY WITH PROPOFOL N/A 05/24/2016   Procedure: COLONOSCOPY WITH PROPOFOL;  Surgeon: [name], MD;  Location: [facility] ENDOSCOPY;  Service: Endoscopy;  Laterality: N/A;  . CORONARY ANGIOPLASTY    . INSERT / REPLACE / REMOVE PACEMAKER  2011   just a pacer  . KNEE ARTHROSCOPY Right 1980's  . LEFT HEART CATH AND CORONARY ANGIOGRAPHY N/A 02/08/2017   Procedure: LEFT HEART CATH AND CORONARY ANGIOGRAPHY;  Surgeon: [name], MD;  Location: [place] CV LAB;  Service: Cardiovascular;  Laterality: N/A;  . PACEMAKER PLACEMENT  01/17/2010  . TOTAL KNEE ARTHROPLASTY Right 01/14/2016   Procedure: TOTAL KNEE ARTHROPLASTY;  Surgeon: [name], MD;  Location: [name] ORS;  Service: Orthopedics;  Laterality: Right;    Family History  Problem Relation Age of Onset  . Cancer Mother        'male cancer"  . Hypertension Mother   . Hyperlipidemia Mother   . Heart disease Father   . Heart attack Father   . Hypertension Father   . Hyperlipidemia Father     Social History:  reports that he quit smoking about 8 years ago. His smoking use included cigarettes. He has a 30.00 pack-year smoking history. He has never used smokeless tobacco. He reports that he does not drink alcohol or use drugs.  Allergies: No Known Allergies  Medications: I have reviewed the patient's current medications.  Results for orders placed or performed during the hospital encounter of 04/17/17 (from the past 48 hour(s))  Glucose, capillary     Status: Abnormal   Collection Time: 04/17/17  4:16 PM  Result Value Ref Range   Glucose-Capillary 131 (H) 65 - 99 mg/dL  Troponin I     Status: Abnormal   Collection Time: 04/17/17  4:30 PM  Result Value Ref Range   Troponin I  0.07 (HH) <0.03 ng/mL    Comment: CRITICAL VALUE NOTED. VALUE IS CONSISTENT WITH PREVIOUSLY REPORTED/CALLED VALUE BY CAF Performed at [facility], [address] [place], [place] [address]   Troponin I     Status: Abnormal   Collection Time: 04/17/17 10:05 PM  Result Value Ref Range   Troponin I 0.06 (HH) <0.03 ng/mL    Comment: CRITICAL VALUE NOTED. VALUE IS CONSISTENT WITH PREVIOUSLY REPORTED/CALLED VALUE JAG Performed at [facility], [address] [place], [place] [address]   Troponin I     Status: Abnormal   Collection Time: 04/18/17  3:45 AM  Result Value Ref Range   Troponin I 0.06 (HH) <0.03 ng/mL    Comment: CRITICAL VALUE NOTED. VALUE IS CONSISTENT WITH PREVIOUSLY REPORTED/CALLED VALUE. JAG Performed at [facility], [address] [place], [place] [address]   CBC     Status: Abnormal   Collection Time: 04/18/17  3:45 AM  Result Value Ref Range   WBC 6.3 3.8 - 10.6 K/uL   RBC 3.72 (L) 4.40 - 5.90 MIL/uL   Hemoglobin 10.8 (L) 13.0 - 18.0 g/dL   HCT 32.5 (L) 40.0 - 52.0 %   MCV 87.2 80.0 - 100.0 fL   MCH 29.1 26.0 - 34.0 pg   MCHC 33.4 32.0 - 36.0 g/dL   RDW 15.2 (H) 11.5 - 14.5 %   Platelets 271 150 - 440 K/uL    Comment: Performed at [facility], [address] [place], [place] [address]  Basic metabolic panel     Status: Abnormal   Collection Time: 04/18/17  3:45 AM  Result Value Ref Range   Sodium 137 135 - 145 mmol/L   Potassium 3.7 3.5 - 5.1 mmol/L   Chloride 100 (L) 101 - 111 mmol/L   CO2 27 22 - 32 mmol/L   Glucose, Bld 211 (H) 65 - 99 mg/dL   BUN 24 (H) 6 - 20 mg/dL   Creatinine, Ser 1.05 0.61 - 1.24 mg/dL   Calcium 8.4 (L) 8.9 - 10.3 mg/dL   GFR calc non Af Amer >60 >60 mL/min   GFR calc Af Amer >60 >60 mL/min    Comment: (NOTE) The eGFR has been calculated using the CKD EPI equation. This calculation has not been validated in all clinical situations. eGFR's persistently <60 mL/min signify possible  Chronic Kidney Disease.    Anion gap 10 5 - 15    Comment: Performed at [facility], [address] [place], [place] [address]  Magnesium     Status: None   Collection Time: 04/18/17  3:45 AM  Result Value Ref Range   Magnesium 1.9 1.7 - 2.4 mg/dL    Comment: Performed at [facility], [address] [place], [place] [address]  Phosphorus     Status: None   Collection Time: 04/18/17  3:45 AM  Result Value Ref Range   Phosphorus 3.1 2.5 - 4.6 mg/dL    Comment: Performed at [facility], [address] [place], [place] [address]  Procalcitonin - Baseline     Status: None   Collection Time: 04/18/17  3:45 AM  Result Value Ref Range   Procalcitonin <0.10 ng/mL    Comment:        Interpretation: PCT (Procalcitonin) <= 0.5 ng/mL: Systemic infection (sepsis) is not likely. Local bacterial infection is possible. (NOTE)       Sepsis PCT Algorithm           Lower Respiratory Tract                                      Infection PCT Algorithm    ----------------------------     ----------------------------         PCT < 0.25 ng/mL                PCT < 0.10 ng/mL         Strongly encourage             Strongly discourage   discontinuation of antibiotics    initiation of antibiotics    ----------------------------     -----------------------------       PCT 0.25 - 0.50 ng/mL  PCT 0.10 - 0.25 ng/mL               OR       >80% decrease in PCT            Discourage initiation of                                            antibiotics      Encourage discontinuation           of antibiotics    ----------------------------     -----------------------------         PCT >= 0.50 ng/mL              PCT 0.26 - 0.50 ng/mL               AND        <80% decrease in PCT             Encourage initiation of                                             antibiotics       Encourage continuation           of antibiotics    ----------------------------      -----------------------------        PCT >= 0.50 ng/mL                  PCT > 0.50 ng/mL               AND         increase in PCT                  Strongly encourage                                      initiation of antibiotics    Strongly encourage escalation           of antibiotics                                     -----------------------------                                           PCT <= 0.25 ng/mL                                                 OR                                        > 80% decrease in PCT  Discontinue / Do not initiate                                             antibiotics Performed at [facility], [address] [place], [place] [address]     Dg Chest Port 1 View  Result Date: 04/18/2017 CLINICAL DATA:  Congestive heart failure EXAM: PORTABLE CHEST 1 VIEW COMPARISON:  April 17, 2017 in March 25, 2017 FINDINGS: There is trace interstitial edema. There are small pleural effusions bilaterally with bibasilar atelectasis. There is patchy airspace consolidation in the right base. There is cardiomegaly with pulmonary venous hypertension. Pacemaker leads are attached the right atrium and right ventricle. Patient is status post coronary artery bypass grafting. No adenopathy. No bone lesions. IMPRESSION: Pulmonary vascular congestion with trace interstitial edema and small pleural effusions bilaterally. Patchy airspace consolidation right base raises question of superimposed pneumonia in this area. No adenopathy appreciable. Electronically Signed   By: Lowella Grip III M.D.   On: 04/18/2017 08:06    Review of Systems  Constitutional: Positive for diaphoresis and malaise/fatigue.  HENT: Positive for congestion.   Eyes: Negative.   Respiratory: Positive for shortness of breath.   Cardiovascular: Positive for chest pain, orthopnea, leg swelling and PND.  Gastrointestinal: Positive for heartburn.  Genitourinary:  Negative.   Musculoskeletal: Positive for myalgias.  Skin: Negative.   Neurological: Negative.   Endo/Heme/Allergies: Negative.   Psychiatric/Behavioral: Negative.    Blood pressure 122/81, pulse 97, temperature (!) 97.5 F (36.4 C), temperature source Oral, resp. rate 20, height 5' 9"  (1.753 m), weight 201 lb 3.2 oz (91.3 kg), SpO2 97 %. Physical Exam  Constitutional: He is oriented to person, place, and time. He appears well-developed and well-nourished.  HENT:  Head: Normocephalic and atraumatic.  Eyes: Pupils are equal, round, and reactive to light. Conjunctivae and EOM are normal.  Neck: Normal range of motion. Neck supple.  Cardiovascular: Normal rate and regular rhythm.  Murmur heard. Respiratory: He has decreased breath sounds. He has rhonchi.  GI: Soft. Bowel sounds are normal.  Musculoskeletal: Normal range of motion.  Neurological: He is alert and oriented to person, place, and time. He has normal reflexes.  Skin: Skin is warm and dry.  Psychiatric: His mood appears anxious. He is actively hallucinating. Cognition and memory are impaired.    Assessment/Plan: Acute on chronic congestive heart failure Cardiomyopathy Moderate to severe COPD Atrial fibrillation paroxysmal Chronic renal insufficiency Hyperlipidemia History of coronary bypass surgery Multivessel coronary disease Former  Smoker . Plan Agree with admission to ICU status post BiPAP therapy for respiratory failure Continue diuretic therapy for shortness of breath Consider nephrology follow-up for renal insufficiency Continue Eliquis therapy for atrial fibrillation Recommend add ARB or ACE inhibitor for heart failure management Consider low-dose spironolactone Continue lipid management with Lipitor Agree with inhaler therapy for COPD Refer the patient to heart failure clinic  Dwayne D Callwood 04/19/2017, 1:27 PM

## 2017-04-20 LAB — BASIC METABOLIC PANEL
ANION GAP: 10 (ref 5–15)
BUN: 31 mg/dL — ABNORMAL HIGH (ref 6–20)
CO2: 30 mmol/L (ref 22–32)
Calcium: 8.5 mg/dL — ABNORMAL LOW (ref 8.9–10.3)
Chloride: 97 mmol/L — ABNORMAL LOW (ref 101–111)
Creatinine, Ser: 1.2 mg/dL (ref 0.61–1.24)
GFR, EST NON AFRICAN AMERICAN: 59 mL/min — AB (ref >60)
Glucose, Bld: 125 mg/dL — ABNORMAL HIGH (ref 65–99)
POTASSIUM: 3.5 mmol/L (ref 3.5–5.1)
Sodium: 137 mmol/L (ref 135–145)

## 2017-04-20 LAB — EXPECTORATED SPUTUM ASSESSMENT W GRAM STAIN, RFLX TO RESP C

## 2017-04-20 LAB — CBC
HCT: 35.8 % — ABNORMAL LOW (ref 40.0–52.0)
Hemoglobin: 11.9 g/dL — ABNORMAL LOW (ref 13.0–18.0)
MCH: 28.8 pg (ref 26.0–34.0)
MCHC: 33.2 g/dL (ref 32.0–36.0)
MCV: 86.9 fL (ref 80.0–100.0)
Platelets: 291 10*3/uL (ref 150–440)
RBC: 4.12 MIL/uL — ABNORMAL LOW (ref 4.40–5.90)
RDW: 15.4 % — ABNORMAL HIGH (ref 11.5–14.5)
WBC: 6.2 10*3/uL (ref 3.8–10.6)

## 2017-04-20 LAB — GLUCOSE, CAPILLARY: Glucose-Capillary: 203 mg/dL — ABNORMAL HIGH (ref 65–99)

## 2017-04-20 MED ORDER — LISINOPRIL 5 MG PO TABS: 2.5000 mg | ORAL_TABLET | Freq: Two times a day (BID) | ORAL | Status: DC

## 2017-04-20 MED ORDER — BENZONATATE 100 MG PO CAPS
200.0000 mg | ORAL_CAPSULE | Freq: Three times a day (TID) | ORAL | Status: DC
  Administered 2017-04-20 – 2017-04-22 (×6): 200 mg via ORAL
  Filled 2017-04-20 (×6): qty 2

## 2017-04-20 MED ORDER — FUROSEMIDE 40 MG PO TABS: 40.0000 mg | ORAL_TABLET | Freq: Every day | ORAL | Status: DC

## 2017-04-20 MED ORDER — LEVALBUTEROL HCL 1.25 MG/0.5ML IN NEBU
1.2500 mg | INHALATION_SOLUTION | RESPIRATORY_TRACT | Status: DC | PRN
  Administered 2017-04-20 – 2017-04-21 (×3): 1.25 mg via RESPIRATORY_TRACT
  Filled 2017-04-20 (×4): qty 0.5

## 2017-04-20 NOTE — Plan of Care (Signed)
Patient resting quietly this shift. Cooperative. No complaints. BP remains soft. Instructed not to give morning metoprolol unless heart rate gets too fast (currently 62) per Dr. Leslye Peer. Will continue to monitor.

## 2017-04-20 NOTE — Consult Note (Signed)
   [facility] CM Inpatient Consult   04/20/2017  SAJAD GLANDER April 03, 1945 [id number]   Referral received to assess for care management services.Spoke with inpatient case manager about talking with patient at the bedside and was advised patient's discharge disposition had not been decided and it was not a good time to introduce [facility] Case Management.  For questions or concerns please contact:  Arnita Koons RN, [facility] Liaison  [phone]) Business Mobile [phone]) Toll free office

## 2017-04-20 NOTE — Evaluation (Signed)
Physical Therapy Evaluation Patient Details Name: Donald Juarez MRN: [id number] DOB: 06/23/1945 Today's Date: 04/20/2017   History of Present Illness  Pt is a 72 year old male admitted from home for acute respiratory failure, flash pulmonary edema, COPD exacerbation and AKI.  PMH includes chronic systolic heart failure, CAD, OA, asthma, PVD, COPD.  Clinical Impression  Pt is a 72 y.o. Male who lives in a 3 story home alone.  He is independent with all activity and uses a RW for longer distance walking at baseline.  Pt in bed upon PT arrival with telesitter.  Pt able to perform bed mobility independently and presented with WNL strength of bilateral UE/LE.  Pt reports no loss of sensation in bilateral feet.  He is able to perform STS without VC's for body mechanics or use of RW but requires 1-2 attempts.  Pt was able to ambulate 60 ft with RW in room with no rest breaks but did experience one LOB to the right which he was able to self correct.  Pt uses furniture for support when not using RW.  Pt will continue to benefit from PT for balance, tolerance to activity and safe mobility.    Follow Up Recommendations Home health PT    Equipment Recommendations       Recommendations for Other Services       Precautions / Restrictions Precautions Precautions: Fall Restrictions Weight Bearing Restrictions: No      Mobility  Bed Mobility Overal bed mobility: Independent                Transfers Overall transfer level: Independent                  Ambulation/Gait Ambulation/Gait assistance: Supervision Ambulation Distance (Feet): 80 Feet       Gait velocity interpretation: at or above normal speed for age/gender General Gait Details: Pt ambulated in room with RW or "furniture cruising" and was able to complete without rest.  He demonstrated one lateral LOB which he self corrected.  Stairs            Wheelchair Mobility    Modified Rankin (Stroke Patients Only)        Balance Overall balance assessment: Modified Independent                                           Pertinent Vitals/Pain Pain Assessment: No/denies pain    Home Living Family/patient expects to be discharged to:: Private residence Living Arrangements: Alone Available Help at Discharge: Family;Available PRN/intermittently Type of Home: House Home Access: Stairs to enter   Entrance Stairs-Number of Steps: 3 Home Layout: Multi-level;Able to live on main level with bedroom/bathroom Home Equipment: Toilet riser      Prior Function Level of Independence: Independent with assistive device(s)         Comments: Able to drive     Hand Dominance        Extremity/Trunk Assessment   Upper Extremity Assessment Upper Extremity Assessment: Overall WFL for tasks assessed    Lower Extremity Assessment Lower Extremity Assessment: Overall WFL for tasks assessed    Cervical / Trunk Assessment Cervical / Trunk Assessment: Normal  Communication   Communication: No difficulties  Cognition Arousal/Alertness: Awake/alert Behavior During Therapy: WFL for tasks assessed/performed Overall Cognitive Status: Within Functional Limits for tasks assessed  General Comments      Exercises     Assessment/Plan    PT Assessment Patient needs continued PT services  PT Problem List Decreased mobility;Decreased balance       PT Treatment Interventions DME instruction;Functional mobility training;Balance training;Gait training;Therapeutic activities;Neuromuscular re-education;Stair training;Therapeutic exercise;Cognitive remediation;Patient/family education    PT Goals (Current goals can be found in the Care Plan section)  Acute Rehab PT Goals Patient Stated Goal: To go home and continue home health PT PT Goal Formulation: With patient Time For Goal Achievement: 05/04/17 Potential to Achieve Goals: Good     Frequency Min 2X/week   Barriers to discharge        Co-evaluation               AM-PAC PT "6 Clicks" Daily Activity  Outcome Measure Difficulty turning over in bed (including adjusting bedclothes, sheets and blankets)?: A Little Difficulty moving from lying on back to sitting on the side of the bed? : A Little Difficulty sitting down on and standing up from a chair with arms (e.g., wheelchair, bedside commode, etc,.)?: A Little Help needed moving to and from a bed to chair (including a wheelchair)?: A Little Help needed walking in hospital room?: A Little Help needed climbing 3-5 steps with a railing? : A Little 6 Click Score: 18    End of Session Equipment Utilized During Treatment: Gait belt Activity Tolerance: Patient tolerated treatment well Patient left: in chair;with chair alarm set;with nursing/sitter in room(Telesitter present)   PT Visit Diagnosis: Unsteadiness on feet (R26.81)    Time: 2633-3545 PT Time Calculation (min) (ACUTE ONLY): 30 min   Charges:   PT Evaluation $PT Eval Low Complexity: 1 Low     PT G Codes:   PT G-Codes **NOT FOR INPATIENT CLASS** Functional Assessment Tool Used: AM-PAC 6 Clicks Basic Mobility    Donald Juarez, PT, DPT   Donald Juarez 04/20/2017, 9:51 AM

## 2017-04-20 NOTE — Progress Notes (Addendum)
Patient ID: Donald Juarez, male   DOB: [birth year], 72 y.o.   MRN: [id number]  [facility] PROGRESS NOTE  Donald Juarez IWP:[id number] DOB: 1945/11/04 DOA: 04/17/2017 PCP: Sofie Hartigan, MD  HPI/Subjective: Patient feeling a little bit better.  States his cough is breaking up a little bit.  Feeling a little bit better.  Yesterday evening got a bit agitated and required some Haldol.  No pain in the penile area.  He had some trauma with the Foley yesterday.  Objective: Vitals:   04/20/17 0944 04/20/17 0945  BP: 95/61   Pulse: 63 75  Resp:    Temp:    SpO2:  95%    Filed Weights   04/19/17 0500 04/19/17 1035 04/20/17 0605  Weight: 91.4 kg (201 lb 8 oz) 91.3 kg (201 lb 3.2 oz) 92.5 kg (204 lb)    ROS: Review of Systems  Constitutional: Negative for chills and fever.  Eyes: Negative for blurred vision.  Respiratory: Positive for cough and shortness of breath.   Cardiovascular: Positive for chest pain.  Gastrointestinal: Negative for abdominal pain, constipation, diarrhea, nausea and vomiting.  Genitourinary: Positive for hematuria. Negative for dysuria.  Musculoskeletal: Negative for joint pain.  Neurological: Negative for dizziness and headaches.   Exam: Physical Exam  Constitutional: He is oriented to person, place, and time.  HENT:  Nose: No mucosal edema.  Mouth/Throat: No oropharyngeal exudate or posterior oropharyngeal edema.  Eyes: Pupils are equal, round, and reactive to light. Conjunctivae, EOM and lids are normal.  Neck: No JVD present. Carotid bruit is not present. No edema present. No thyroid mass and no thyromegaly present.  Cardiovascular: S1 normal and S2 normal. Exam reveals no gallop.  No murmur heard. Respiratory: No respiratory distress. He has decreased breath sounds in the right lower field and the left lower field. He has wheezes in the right middle field, the right lower field, the left middle field and the left lower field. He has no rhonchi. He  has no rales.  GI: Soft. Bowel sounds are normal. There is no tenderness.  Genitourinary:  Genitourinary Comments: Dried blood around the penis.  Urine starting to clear.  Musculoskeletal:       Right ankle: He exhibits no swelling.       Left ankle: He exhibits no swelling.  Lymphadenopathy:    He has no cervical adenopathy.  Neurological: He is alert and oriented to person, place, and time. No cranial nerve deficit.  Skin: Skin is warm. No rash noted. Nails show no clubbing.  Psychiatric: He has a normal mood and affect.      Data Reviewed: Basic Metabolic Panel: Recent Labs  Lab 04/17/17 1300 04/18/17 0345 04/20/17 0529  NA 138 137 137  K 4.3 3.7 3.5  CL 103 100* 97*  CO2 24 27 30   GLUCOSE 227* 211* 125*  BUN 19 24* 31*  CREATININE 1.27* 1.05 1.20  CALCIUM 8.2* 8.4* 8.5*  MG  --  1.9  --   PHOS  --  3.1  --    Liver Function Tests: Recent Labs  Lab 04/17/17 1300  AST 73*  ALT 42  ALKPHOS 121  BILITOT 0.5  PROT 7.0  ALBUMIN 3.1*   CBC: Recent Labs  Lab 04/17/17 1300 04/18/17 0345 04/20/17 0529  WBC 7.9 6.3 6.2  NEUTROABS 5.7  --   --   HGB 11.8* 10.8* 11.9*  HCT 36.4* 32.5* 35.8*  MCV 90.2 87.2 86.9  PLT 354 271 291  Cardiac Enzymes: Recent Labs  Lab 04/17/17 1300 04/17/17 1630 04/17/17 2205 04/18/17 0345  TROPONINI 0.04* 0.07* 0.06* 0.06*   BNP (last 3 results) Recent Labs    03/12/17 0945 03/19/17 0452 04/17/17 1300  BNP 536.0* 557.0* 1,575.0*     CBG: Recent Labs  Lab 04/17/17 1616 04/20/17 1131  GLUCAP 131* 203*     Studies: No results found.  Scheduled Meds: . amiodarone  200 mg Oral Daily  . apixaban  5 mg Oral BID  . arformoterol  15 mcg Nebulization BID  . aspirin EC  81 mg Oral Daily  . atorvastatin  80 mg Oral Daily  . budesonide  0.5 mg Nebulization BID  . cholecalciferol  2,000 Units Oral Daily  . [START ON 04/21/2017] furosemide  40 mg Oral Daily  . ipratropium  0.5 mg Nebulization Q6H  . isosorbide  mononitrate  30 mg Oral Daily  . levalbuterol  1.25 mg Nebulization Q6H  . lisinopril  2.5 mg Oral BID  . mouth rinse  15 mL Mouth Rinse BID  . metoprolol tartrate  12.5 mg Oral BID  . pantoprazole  80 mg Oral Daily  . PARoxetine  30 mg Oral Daily  . predniSONE  40 mg Oral Q breakfast  . protein supplement shake  11 oz Oral BID BM  . sodium chloride flush  3 mL Intravenous Q12H  . spironolactone  12.5 mg Oral Daily  . traZODone  100 mg Oral QHS   Continuous Infusions: . sodium chloride      Assessment/Plan:  1. Acute hypoxic respiratory failure.  Patient initially required BiPAP.  Patient when I saw him was on 4 L nasal cannula.  I asked the nurse to taper down to 2 L. 2. Acute systolic congestive heart failure.  Patient's BUN and creatinine started to rise.  Hold Lasix today.  Start oral Lasix 40 mg tomorrow. 3. Relative hypotension.  Holding spironolactone and lisinopril today.  Give metoprolol if able to do so today. 4. Atrial fibrillation on Eliquis metoprolol and amiodarone for rate control for anticoagulation.  5. COPD exacerbation.  Continue prednisone 40 mg daily.  On budesonide and Brovana, Xopenex and ipratropium nebulizers 6. Hyperlipidemia unspecified on atorvastatin 7. Depression on Paxil 8. GERD on Protonix 9. Borderline elevated troponin demand ischemia from acute hypoxic respiratory failure 10. Fourth admit in 2 months.  Patient will need close clinical follow-up with cardiology and CHF clinic as outpatient. 11. Foley trauma last night with some hematuria.  Hemoglobin stable.  Appreciate urology consultation.  Foley will have to stay in 1 week.   Code Status:     Code Status Orders  (From admission, onward)        Start     Ordered   04/17/17 1610  Full code  Continuous     04/17/17 1610    Code Status History    Date Active Date Inactive Code Status Order ID Comments User Context   03/25/2017 08:23 03/28/2017 17:32 Full Code [id number]  Hillary Bow, MD  ED   03/19/2017 08:29 03/23/2017 15:02 Full Code [id number]  Epifanio Lesches, MD ED   03/12/2017 11:35 03/14/2017 16:53 Full Code [id number]  Dustin Flock, MD ED   02/07/2017 19:27 02/10/2017 08:25 Full Code [id number]  Nicholes Mango, MD Inpatient   01/14/2016 11:22 01/17/2016 16:59 Full Code [id number]  [name], MD Inpatient   11/03/2014 16:40 11/04/2014 13:57 Full Code [id number]  [name], MD Inpatient   11/02/2014 13:33 11/03/2014 16:40 Full Code [id number]  [name], MD Inpatient   10/31/2013 12:08 11/01/2013 01:02 Full Code [id number]  [name], MD Inpatient     Disposition Plan: To be determined  Consultants:   consult cardiology  Time spent: 26 minutes  [facility]           Patient ID: Donald Juarez, male   DOB: [birth year], 72 y.o.   MRN: [id number]

## 2017-04-20 NOTE — Care Management Important Message (Signed)
Signed IM notice givenImportant Message  Patient Details  Name: AZARI HASLER MRN: [id number] Date of Birth: 10/18/1945   Medicare Important Message Given:  Yes    Katrina Stack, RN 04/20/2017, 7:29 PM

## 2017-04-20 NOTE — Care Management (Addendum)
Spoke with [facility]. The social received a bed offer for[facility] for short term rehab and patient back out. His wife is currently at [facility] in [place] and she is under a medicaid plan of care.  Patient denies there there was ever a plan to pursue [facility]. It is reported that patient's son who resides in [place] came to town and took an [place] to patient's pcp for completion.  [name] He currently has a Oncologist for impulsive behavior.  Physical therapy has recommended home with home health but the concerns are not necessary his functional status. Patient is not doing well managing his medical issues on his own- and may benefit from a more supervised environment.  He  discusses initiating a medicaid application so if needs long term care he and his wife can be  together.  CM given permission to contact son but  do not have phone number.

## 2017-04-20 NOTE — Progress Notes (Signed)
Order sputum culture per Dr. Leslye Peer.

## 2017-04-20 NOTE — Progress Notes (Signed)
Dr. Leslye Peer aware of BP. Instructed to hold all morning BP meds and diuretics, amio is ok to give. Instructed to recheck blood pressure in a few hours and give metoprolol and later spironolactone if pressures improve.

## 2017-04-21 LAB — BASIC METABOLIC PANEL
Anion gap: 9 (ref 5–15)
BUN: 42 mg/dL — AB (ref 6–20)
CALCIUM: 8.5 mg/dL — AB (ref 8.9–10.3)
CO2: 32 mmol/L (ref 22–32)
CREATININE: 1.38 mg/dL — AB (ref 0.61–1.24)
Chloride: 96 mmol/L — ABNORMAL LOW (ref 101–111)
GFR calc non Af Amer: 50 mL/min — ABNORMAL LOW (ref >60)
GFR, EST AFRICAN AMERICAN: 57 mL/min — AB (ref >60)
Glucose, Bld: 161 mg/dL — ABNORMAL HIGH (ref 65–99)
Potassium: 3.7 mmol/L (ref 3.5–5.1)
SODIUM: 137 mmol/L (ref 135–145)

## 2017-04-21 LAB — HEMOGLOBIN: HEMOGLOBIN: 10.7 g/dL — AB (ref 13.0–18.0)

## 2017-04-21 MED ORDER — PREDNISONE 20 MG PO TABS
30.0000 mg | ORAL_TABLET | Freq: Every day | ORAL | Status: DC
  Administered 2017-04-22: 08:00:00 30 mg via ORAL
  Filled 2017-04-21: qty 1

## 2017-04-21 NOTE — Progress Notes (Signed)
Patient ID: Donald Juarez, male   DOB: May 09, 1945, 72 y.o.   MRN: [id number]  [facility] PROGRESS NOTE  Donald Juarez:[id number] DOB: 09-Jul-1945 DOA: 04/17/2017 PCP: Sofie Hartigan, MD  HPI/Subjective: Patient feels better.  But BP is low. On O2 [place] 2 L.  Objective: Vitals:   04/21/17 1011 04/21/17 1451  BP: (!) 99/53 91/63  Pulse:    Resp:    Temp:    SpO2: 97%     Filed Weights   04/19/17 1035 04/20/17 0605 04/21/17 0600  Weight: 201 lb 3.2 oz (91.3 kg) 204 lb (92.5 kg) 204 lb 4.8 oz (92.7 kg)    ROS: Review of Systems  Constitutional: Negative for chills and fever.  Eyes: Negative for blurred vision.  Respiratory: Positive for cough and shortness of breath.   Cardiovascular: Negative for chest pain.  Gastrointestinal: Negative for abdominal pain, constipation, diarrhea, nausea and vomiting.  Genitourinary: Negative for dysuria and hematuria.  Musculoskeletal: Negative for joint pain.  Neurological: Negative for dizziness and headaches.   Exam: Physical Exam  Constitutional: He is oriented to person, place, and time.  HENT:  Nose: No mucosal edema.  Mouth/Throat: No oropharyngeal exudate or posterior oropharyngeal edema.  Eyes: Pupils are equal, round, and reactive to light. Conjunctivae, EOM and lids are normal.  Neck: No JVD present. Carotid bruit is not present. No edema present. No thyroid mass and no thyromegaly present.  Cardiovascular: S1 normal and S2 normal. Exam reveals no gallop.  No murmur heard. Respiratory: Effort normal and breath sounds normal. No respiratory distress. He has no wheezes. He has no rhonchi. He has no rales.  GI: Soft. Bowel sounds are normal. There is no tenderness.  Musculoskeletal:       Right ankle: He exhibits no swelling.       Left ankle: He exhibits no swelling.  Lymphadenopathy:    He has no cervical adenopathy.  Neurological: He is alert and oriented to person, place, and time. No cranial nerve deficit.  Skin:  Skin is warm. No rash noted. Nails show no clubbing.  Psychiatric: He has a normal mood and affect.      Data Reviewed: Basic Metabolic Panel: Recent Labs  Lab 04/17/17 1300 04/18/17 0345 04/20/17 0529 04/21/17 0505  NA 138 137 137 137  K 4.3 3.7 3.5 3.7  CL 103 100* 97* 96*  CO2 24 27 30  32  GLUCOSE 227* 211* 125* 161*  BUN 19 24* 31* 42*  CREATININE 1.27* 1.05 1.20 1.38*  CALCIUM 8.2* 8.4* 8.5* 8.5*  MG  --  1.9  --   --   PHOS  --  3.1  --   --    Liver Function Tests: Recent Labs  Lab 04/17/17 1300  AST 73*  ALT 42  ALKPHOS 121  BILITOT 0.5  PROT 7.0  ALBUMIN 3.1*   CBC: Recent Labs  Lab 04/17/17 1300 04/18/17 0345 04/20/17 0529 04/21/17 0505  WBC 7.9 6.3 6.2  --   NEUTROABS 5.7  --   --   --   HGB 11.8* 10.8* 11.9* 10.7*  HCT 36.4* 32.5* 35.8*  --   MCV 90.2 87.2 86.9  --   PLT 354 271 291  --    Cardiac Enzymes: Recent Labs  Lab 04/17/17 1300 04/17/17 1630 04/17/17 2205 04/18/17 0345  TROPONINI 0.04* 0.07* 0.06* 0.06*   BNP (last 3 results) Recent Labs    03/12/17 0945 03/19/17 0452 04/17/17 1300  BNP 536.0* 557.0* 1,575.0*  CBG: Recent Labs  Lab 04/17/17 1616 04/20/17 1131  GLUCAP 131* 203*     Studies: No results found.  Scheduled Meds: . amiodarone  200 mg Oral Daily  . apixaban  5 mg Oral BID  . arformoterol  15 mcg Nebulization BID  . aspirin EC  81 mg Oral Daily  . atorvastatin  80 mg Oral Daily  . benzonatate  200 mg Oral TID  . budesonide  0.5 mg Nebulization BID  . cholecalciferol  2,000 Units Oral Daily  . ipratropium  0.5 mg Nebulization Q6H  . isosorbide mononitrate  30 mg Oral Daily  . levalbuterol  1.25 mg Nebulization Q6H  . mouth rinse  15 mL Mouth Rinse BID  . metoprolol tartrate  12.5 mg Oral BID  . pantoprazole  80 mg Oral Daily  . PARoxetine  30 mg Oral Daily  . [START ON 04/22/2017] predniSONE  30 mg Oral Q breakfast  . protein supplement shake  11 oz Oral BID BM  . sodium chloride flush  3  mL Intravenous Q12H  . traZODone  100 mg Oral QHS   Continuous Infusions: . sodium chloride      Assessment/Plan:  1. Acute hypoxic respiratory failure.  Patient initially required BiPAP, then on 4 L nasal cannula and tapered down to 2 L. 2. Acute systolic congestive heart failure, EF35-40%.  Patient's BUN and creatinine is up.  Hold Lasix. 3. Relative hypotension.  Hold spironolactone and lisinopril and metoprolol if BP is low. 4. Atrial fibrillation on Eliquis, metoprolol and amiodarone for rate control for anticoagulation.  5. COPD exacerbation.  taper prednisone.  On budesonide and Brovana, Xopenex and ipratropium nebulizers 6. Hyperlipidemia unspecified on atorvastatin 7. Depression on Paxil 8. GERD on Protonix 9. Borderline elevated troponin demand ischemia from acute hypoxic respiratory failure 10. Fourth admit in 2 months.  Patient will need close clinical follow-up with cardiology and CHF clinic as outpatient. 11. Foley trauma with some hematuria.  Hemoglobin stable.  Appreciate urology consultation.  Foley will have to stay in 1 week. 12. ARF due to dehydration. Hold lasix, spironolactone and lisinopril, f/u BMP.   Code Status:     Code Status Orders  (From admission, onward)        Start     Ordered   04/17/17 1610  Full code  Continuous     04/17/17 1610    Code Status History    Date Active Date Inactive Code Status Order ID Comments User Context   03/25/2017 08:23 03/28/2017 17:32 Full Code [id number]  Hillary Bow, MD ED   03/19/2017 08:29 03/23/2017 15:02 Full Code [id number]  Epifanio Lesches, MD ED   03/12/2017 11:35 03/14/2017 16:53 Full Code [id number]  Dustin Flock, MD ED   02/07/2017 19:27 02/10/2017 08:25 Full Code [id number]  Nicholes Mango, MD Inpatient   01/14/2016 11:22 01/17/2016 16:59 Full Code [id number]  [name], MD Inpatient   11/03/2014 16:40 11/04/2014 13:57 Full Code [id number]  [name], MD Inpatient   11/02/2014 13:33 11/03/2014 16:40  Full Code [id number]  [name], MD Inpatient   10/31/2013 12:08 11/01/2013 01:02 Full Code [id number]  [name], MD Inpatient     Disposition Plan: To be determined  Consultants:   consult cardiology  Time spent: 32 minutes  [facility]           Patient ID: Donald Juarez, male   DOB: 10/24/1945, 72 y.o.   MRN: [id number]

## 2017-04-21 NOTE — Progress Notes (Signed)
Spoke with dr. Bridgett Larsson to make aware of patient low bp 99/53 per md hold scheduled metoprolol and imdur, okay to give amiodarone. Will continue to monitor

## 2017-04-22 LAB — BASIC METABOLIC PANEL
Anion gap: 9 (ref 5–15)
BUN: 39 mg/dL — AB (ref 6–20)
CO2: 29 mmol/L (ref 22–32)
CREATININE: 1.01 mg/dL (ref 0.61–1.24)
Calcium: 8.4 mg/dL — ABNORMAL LOW (ref 8.9–10.3)
Chloride: 97 mmol/L — ABNORMAL LOW (ref 101–111)
GFR calc Af Amer: >60 mL/min (ref >60)
GLUCOSE: 163 mg/dL — AB (ref 65–99)
Potassium: 3.7 mmol/L (ref 3.5–5.1)
SODIUM: 135 mmol/L (ref 135–145)

## 2017-04-22 MED ORDER — BENZONATATE 100 MG PO CAPS: 100.0000 mg | ORAL_CAPSULE | Freq: Three times a day (TID) | ORAL | 0 refills | Status: AC | PRN

## 2017-04-22 MED ORDER — METOPROLOL TARTRATE 25 MG PO TABS: 12.5000 mg | ORAL_TABLET | Freq: Two times a day (BID) | ORAL | 2 refills | Status: AC

## 2017-04-22 MED ORDER — LISINOPRIL 5 MG PO TABS: 5.0000 mg | ORAL_TABLET | Freq: Every day | ORAL | 1 refills | Status: DC

## 2017-04-22 MED ORDER — PREDNISONE 10 MG PO TABS: ORAL_TABLET | ORAL | 0 refills | Status: DC

## 2017-04-22 NOTE — Plan of Care (Signed)
  Problem: Clinical Measurements: Goal: Respiratory complications will improve Outcome: Progressing   

## 2017-04-22 NOTE — Discharge Instructions (Signed)
Heart healthy diet. Continue home O2 [place] 2L Hold HTN medication if SBP<110 or DBP<65. HHPT.

## 2017-04-22 NOTE — Care Management (Signed)
Discharge to home today per Dr. Bridgett Larsson. Unable to reach Calvin at [facility]. Will fax orders to company.  Family will transport Shelbie Ammons RN MSN CCM Care Management [phone]

## 2017-04-22 NOTE — Discharge Summary (Signed)
Donald Juarez at [facility] NAME: Donald Juarez    MR#:  [id number]  DATE OF BIRTH:  10-Feb-1945  DATE OF ADMISSION:  04/17/2017   ADMITTING PHYSICIAN: Dustin Flock, MD  DATE OF DISCHARGE: 04/22/2017 PRIMARY CARE PHYSICIAN: Sofie Hartigan, MD   ADMISSION DIAGNOSIS:  Flash pulmonary edema ([place]) [J81.0] COPD exacerbation ([place]) [J44.1] AKI (acute kidney injury) ([place]) [N17.9] Acute respiratory failure with hypoxia and hypercapnia ([place]) [J96.01, J96.02] DISCHARGE DIAGNOSIS:  Active Problems:   Acute respiratory failure ([place])  SECONDARY DIAGNOSIS:   Past Medical History:  Diagnosis Date  . Anginal pain ([place])    feels this as jaw pain.  takes imdur for this. rarely uses nitro.  [name] Anxiety   . Arthritis   . Asthma   . Bilateral claudication of lower limb ([place])   . Carotid stenosis    a. 07/2013 Carotid U/S: 100% RICA (pt prev reported h/o 62%), LICA 37-62%, bilat ECA 50%, normal vertebrals and subclavians bilat-->F/U needed in 01/2014.  . Cataract of left eye   . Cataract, right    a. 10/2010 s/p cataract surgery   . COPD (chronic obstructive pulmonary disease) ([place])   . Coronary artery disease    a. 06/1996 & 03/2005 Cath: non-obs dzs;  b. 07/2009 NSTEMI/Cath: LCX 99->PCI/DES extending into OM1;  c. 01/2013 Cath: patent LCX stent-->Med Rx.; d. cath 03/2014: RPAV dzs->Med rx; e. 10/2014 Cath/PCI: LM 30, LAD 20p/m, LCX 50ost/p, 10 ISR, 60d, OM1 min irregs, OM2 50, RCA 30ost, 48m, 60d, RPDA 70(2.5x8 Xience DES), RPAV 90small(PTCA); f. 10/2014 Relook Cath: RPAV 99(Med Rx), EF 45-50.  [name] Dysrhythmia    since ablation, flutter resolved.  . Essential hypertension   . Hyperkalemia   . Hyperlipidemia   . Myocardial infarction ([employer]) 2015   had stent inserted and then had heart attack after. stent inserted due to blockage  . Paroxysmal atrial flutter ([place])    a. CHA2DS2VASc = 3 -> eliquis;  b. 07/2013 Echo: EF 50-55%, mildly dil LA.c. s/p RFCA 10/31/2013 by  Dr Caryl Comes  . Prostate cancer ([place])   . Pulmonary nodule, right    a. 0.7cm RLL - stable by CT 06/2013.  [name] Rectal cancer ([place])    a. Status post colostomy  . Seizures ([place])    a. last 30 years ago  . Shingles    a. 09/2012.  . SSS (sick sinus syndrome) ([place])    a. 12/2009 S/P MDT Adapta ADDR01 DC PPM, ser # [id number] ([name]).  . Syncope and collapse    HOSPITAL COURSE:  1. Acute hypoxic respiratory failure.  Patient initially required BiPAP, then on 4 L nasal cannula and tapered down to 2 L. 2. Acute systolic congestive heart failure, EF35-40%.  Patient's BUN and creatinine improved, resume Lasix. 3. Relative hypotension.  Hold spironolactone and lisinopril and metoprolol if BP is low.  Resume low dose lisinopril. Stopped imdur. 4. Atrial fibrillation on Eliquis, metoprolol and amiodarone for rate control for anticoagulation.  5. COPD exacerbation.  taper prednisone.  On budesonide and Brovana, Xopenex and ipratropium nebulizers 6. Hyperlipidemia unspecified on atorvastatin 7. Depression on Paxil 8. GERD on Protonix 9. Borderline elevated troponin demand ischemia from acute hypoxic respiratory failure 10. Fourth admit in 2 months.  Patient will need close clinical follow-up with cardiology and CHF clinic as outpatient. 11. Foley trauma with some hematuria.  Hemoglobin stable.  Appreciate urology consultation.  Foley will have to stay in 1 week. F/u urology as outpatient.  12. ARF due to dehydration. improved. resume lasix and lisinopril.  DISCHARGE CONDITIONS:  Stable, discharge to home with HHPT. CONSULTS OBTAINED:  Treatment Team:  Wilhelmina Mcardle, MD Yolonda Kida, MD Nickie Retort, MD DRUG ALLERGIES:   Allergies  Allergen Reactions  . Other     Sleep medications cause hallucinations, agitation per patient's son. Did not know which one.   DISCHARGE MEDICATIONS:   Allergies as of 04/22/2017      Reactions   Other    Sleep medications cause hallucinations,  agitation per patient's son. Did not know which one.      Medication List    STOP taking these medications   guaiFENesin 600 MG 12 hr tablet Commonly known as:  MUCINEX   ipratropium 0.02 % nebulizer solution Commonly known as:  ATROVENT   isosorbide mononitrate 30 MG 24 hr tablet Commonly known as:  IMDUR   traMADol 50 MG tablet Commonly known as:  ULTRAM     TAKE these medications   ALPRAZolam 0.25 MG tablet Commonly known as:  XANAX Take 1 tablet by mouth 2 (two) times daily as needed.   amiodarone 200 MG tablet Commonly known as:  PACERONE Take 200 mg by mouth daily.   arformoterol 15 MCG/2ML Nebu Commonly known as:  BROVANA Take 2 mLs (15 mcg total) by nebulization 2 (two) times daily. Take 2 mLs (15 mcg total) by nebulization 2 (two) times daily. DX code 493.20 Every morning and 1800   aspirin EC 81 MG tablet Take 81 mg by mouth daily.   atorvastatin 80 MG tablet Commonly known as:  LIPITOR Take 1 tablet (80 mg total) by mouth daily.   benzonatate 100 MG capsule Commonly known as:  TESSALON Take 1 capsule (100 mg total) by mouth 3 (three) times daily as needed for cough.   budesonide 0.5 MG/2ML nebulizer solution Commonly known as:  PULMICORT Take 2 mLs (0.5 mg total) by nebulization 2 (two) times daily. DX code 493.20 Every morning and 1800   budesonide-formoterol 160-4.5 MCG/ACT inhaler Commonly known as:  SYMBICORT Inhale 2 puffs into the lungs 2 (two) times daily.   ELIQUIS 5 MG Tabs tablet Generic drug:  apixaban Take 5 mg by mouth 2 (two) times daily.   furosemide 40 MG tablet Commonly known as:  LASIX Take 1 tablet (40 mg total) by mouth daily.   ipratropium 0.06 % nasal spray Commonly known as:  ATROVENT Place 2 sprays into the nose 4 (four) times daily.   levalbuterol 0.63 MG/3ML nebulizer solution Commonly known as:  XOPENEX Take 3 mLs (0.63 mg total) by nebulization every 4 (four) hours as needed for wheezing or shortness of  breath.   meclizine 12.5 MG tablet Commonly known as:  ANTIVERT Take 1-2 tablets by mouth 3 (three) times daily as needed.   metoprolol tartrate 25 MG tablet Commonly known as:  LOPRESSOR Take 0.5 tablets (12.5 mg total) by mouth 2 (two) times daily. What changed:  how much to take   nitroGLYCERIN 0.4 MG SL tablet Commonly known as:  NITROSTAT Place 1 tablet (0.4 mg total) under the tongue every 5 (five) minutes as needed for chest pain.   omeprazole 40 MG capsule Commonly known as:  PRILOSEC Take 40 mg by mouth daily.   PARoxetine 30 MG tablet Commonly known as:  PAXIL Take 30 mg by mouth daily.   predniSONE 10 MG tablet Commonly known as:  DELTASONE 30 mg po daily for 1 day, 20 mg po daily  for 1 day, 10 mg po daily for 1 day.   Tiotropium Bromide Monohydrate 1.25 MCG/ACT Aers Commonly known as:  SPIRIVA RESPIMAT Inhale 2 puffs into the lungs 2 (two) times daily.   Vitamin D 2000 units tablet Take 2,000 Units by mouth daily.        DISCHARGE INSTRUCTIONS:  See AVS.  If you experience worsening of your admission symptoms, develop shortness of breath, life threatening emergency, suicidal or homicidal thoughts you must seek medical attention immediately by calling 911 or calling your MD immediately  if symptoms less severe.  You Must read complete instructions/literature along with all the possible adverse reactions/side effects for all the Medicines you take and that have been prescribed to you. Take any new Medicines after you have completely understood and accpet all the possible adverse reactions/side effects.   Please note  You were cared for by a hospitalist during your hospital stay. If you have any questions about your discharge medications or the care you received while you were in the hospital after you are discharged, you can call the unit and asked to speak with the hospitalist on call if the hospitalist that took care of you is not available. Once you are  discharged, your primary care physician will handle any further medical issues. Please note that NO REFILLS for any discharge medications will be authorized once you are discharged, as it is imperative that you return to your primary care physician (or establish a relationship with a primary care physician if you do not have one) for your aftercare needs so that they can reassess your need for medications and monitor your lab values.    On the day of Discharge:  VITAL SIGNS:  Blood pressure 123/79, pulse 96, temperature 97.8 F (36.6 C), temperature source Oral, resp. rate 19, height 5\' 9"  (1.753 m), weight 201 lb (91.2 kg), SpO2 99 %. PHYSICAL EXAMINATION:  GENERAL:  72 y.o.-year-old patient lying in the bed with no acute distress.  EYES: Pupils equal, round, reactive to light and accommodation. No scleral icterus. Extraocular muscles intact.  HEENT: Head atraumatic, normocephalic. Oropharynx and nasopharynx clear.  NECK:  Supple, no jugular venous distention. No thyroid enlargement, no tenderness.  LUNGS: Normal breath sounds bilaterally, no wheezing, rales,rhonchi or crepitation. No use of accessory muscles of respiration.  CARDIOVASCULAR: S1, S2 normal. No murmurs, rubs, or gallops.  ABDOMEN: Soft, non-tender, non-distended. Bowel sounds present. No organomegaly or mass.  EXTREMITIES: No pedal edema, cyanosis, or clubbing.  NEUROLOGIC: Cranial nerves II through XII are intact. Muscle strength 5/5 in all extremities. Sensation intact. Gait not checked.  PSYCHIATRIC: The patient is alert and oriented x 3.  SKIN: No obvious rash, lesion, or ulcer.  DATA REVIEW:   CBC Recent Labs  Lab 04/20/17 0529 04/21/17 0505  WBC 6.2  --   HGB 11.9* 10.7*  HCT 35.8*  --   PLT 291  --     Chemistries  Recent Labs  Lab 04/17/17 1300 04/18/17 0345  04/22/17 0458  NA 138 137   < > 135  K 4.3 3.7   < > 3.7  CL 103 100*   < > 97*  CO2 24 27   < > 29  GLUCOSE 227* 211*   < > 163*  BUN 19 24*    < > 39*  CREATININE 1.27* 1.05   < > 1.01  CALCIUM 8.2* 8.4*   < > 8.4*  MG  --  1.9  --   --  AST 73*  --   --   --   ALT 42  --   --   --   ALKPHOS 121  --   --   --   BILITOT 0.5  --   --   --    < > = values in this interval not displayed.     Microbiology Results  Results for orders placed or performed during the hospital encounter of 04/17/17  Culture, expectorated sputum-assessment     Status: None   Collection Time: 04/20/17 11:38 AM  Result Value Ref Range Status   Specimen Description SPUTUM  Final   Special Requests NONE  Final   Sputum evaluation   Final    THIS SPECIMEN IS ACCEPTABLE FOR SPUTUM CULTURE Performed at [facility], [address] [place], [place] [address]    Report Status 04/20/2017 FINAL  Final  Culture, respiratory (NON-Expectorated)     Status: None (Preliminary result)   Collection Time: 04/20/17 11:38 AM  Result Value Ref Range Status   Specimen Description   Final    SPUTUM Performed at [facility], [address] [place], [place] [address]    Special Requests   Final    NONE Reflexed from [id number] Performed at [facility], [address] [place], [place] [address]    Gram Stain   Final    ABUNDANT WBC PRESENT, PREDOMINANTLY PMN ABUNDANT GRAM NEGATIVE RODS Performed at [facility], [place] [address], [place], [place] [address]    Culture MODERATE PSEUDOMONAS AERUGINOSA  Final   Report Status PENDING  Incomplete    RADIOLOGY:  No results found.   Management plans discussed with the patient, family and they are in agreement.  CODE STATUS: Full Code   TOTAL TIME TAKING CARE OF THIS PATIENT: 35 minutes.    Demetrios Loll M.D on 04/22/2017 at 11:18 AM  Between 7am to 6pm - Pager - [phone]  After 6pm go to [email or web address] - Proofreader  [facility]  Office  [phone]  CC: Primary care physician; Sofie Hartigan, MD   Note: This dictation was  prepared with Dragon dictation along with smaller phrase technology. Any transcriptional errors that result from this process are unintentional.

## 2017-04-23 LAB — CULTURE, RESPIRATORY W GRAM STAIN

## 2017-04-23 LAB — CULTURE, RESPIRATORY

## 2017-04-23 NOTE — Progress Notes (Deleted)
Subjective:    Patient ID: Donald Juarez, male    DOB: 1945-03-25, 72 y.o.   MRN: [id number]  Synopsis: Mr. Habenicht first saw the [facility] Pulmonary clinic in 2014 for COPD after smoking 1 PPD for 30 years and quitting in 2010.  He has frequent exacerbations.    03/2013 Full PFT> Ratio 53% FEV1 1.89 (65% pred, 13% change), TLC 5.83 L (93% pred), DLCO 16.6 (69% pred) Quit tobacco 2010 Recent Cardiac Stent 11/16  CC: chronic SOB, follow up COPD +cough and chest congestion  HPI Patient is a 72 year old male with a history of severe COPD.  He normally sees Dr. Mortimer Fries.  He thus far has had 6 hospital admissions in the first 3 months of this year for various cardio/respiratory issues including COPD exacerbation, unstable angina, pulmonary edema with congestive heart failure.  At last visit he was noted to be very confused about his inhalers, he had 2 pockets full of Inhalers, we therefore discontinued them and asked the patient to instead use nebulized Xopenex as needed.  He had been taken off azithromycin after a previous hospitalization due to concomitant use of amiodarone.     Current Outpatient Medications:  .  ALPRAZolam (XANAX) 0.25 MG tablet, Take 1 tablet by mouth 2 (two) times daily as needed., Disp: , Rfl:  .  amiodarone (PACERONE) 200 MG tablet, Take 200 mg by mouth daily., Disp: , Rfl:  .  apixaban (ELIQUIS) 5 MG TABS tablet, Take 5 mg by mouth 2 (two) times daily., Disp: , Rfl:  .  arformoterol (BROVANA) 15 MCG/2ML NEBU, Take 2 mLs (15 mcg total) by nebulization 2 (two) times daily. Take 2 mLs (15 mcg total) by nebulization 2 (two) times daily. DX code 493.20 Every morning and 1800, Disp: 360 mL, Rfl: 3 .  aspirin EC 81 MG tablet, Take 81 mg by mouth daily., Disp: , Rfl:  .  atorvastatin (LIPITOR) 80 MG tablet, Take 1 tablet (80 mg total) by mouth daily., Disp: 90 tablet, Rfl: 3 .  benzonatate (TESSALON) 100 MG capsule, Take 1 capsule (100 mg total) by mouth 3 (three) times  daily as needed for cough., Disp: 20 capsule, Rfl: 0 .  budesonide (PULMICORT) 0.5 MG/2ML nebulizer solution, Take 2 mLs (0.5 mg total) by nebulization 2 (two) times daily. DX code 493.20 Every morning and 1800, Disp: 360 mL, Rfl: 3 .  budesonide-formoterol (SYMBICORT) 160-4.5 MCG/ACT inhaler, Inhale 2 puffs into the lungs 2 (two) times daily., Disp: , Rfl:  .  Cholecalciferol (VITAMIN D) 2000 UNITS tablet, Take 2,000 Units by mouth daily., Disp: , Rfl:  .  furosemide (LASIX) 40 MG tablet, Take 1 tablet (40 mg total) by mouth daily., Disp: 30 tablet, Rfl: 0 .  ipratropium (ATROVENT) 0.06 % nasal spray, Place 2 sprays into the nose 4 (four) times daily., Disp: 15 mL, Rfl: 1 .  levalbuterol (XOPENEX) 0.63 MG/3ML nebulizer solution, Take 3 mLs (0.63 mg total) by nebulization every 4 (four) hours as needed for wheezing or shortness of breath., Disp: 3 mL, Rfl: 2 .  lisinopril (PRINIVIL,ZESTRIL) 5 MG tablet, Take 1 tablet (5 mg total) by mouth daily., Disp: 30 tablet, Rfl: 1 .  meclizine (ANTIVERT) 12.5 MG tablet, Take 1-2 tablets by mouth 3 (three) times daily as needed., Disp: , Rfl:  .  metoprolol tartrate (LOPRESSOR) 25 MG tablet, Take 0.5 tablets (12.5 mg total) by mouth 2 (two) times daily., Disp: 60 tablet, Rfl: 2 .  nitroGLYCERIN (NITROSTAT) 0.4 MG SL tablet,  Place 1 tablet (0.4 mg total) under the tongue every 5 (five) minutes as needed for chest pain., Disp: 25 tablet, Rfl: 3 .  omeprazole (PRILOSEC) 40 MG capsule, Take 40 mg by mouth daily., Disp: , Rfl:  .  PARoxetine (PAXIL) 30 MG tablet, Take 30 mg by mouth daily. , Disp: , Rfl:  .  predniSONE (DELTASONE) 10 MG tablet, 30 mg po daily for 1 day, 20 mg po daily for 1 day, 10 mg po daily for 1 day., Disp: 6 tablet, Rfl: 0 .  Tiotropium Bromide Monohydrate (SPIRIVA RESPIMAT) 1.25 MCG/ACT AERS, Inhale 2 puffs into the lungs 2 (two) times daily., Disp: 12 g, Rfl: 3  Review of Systems  Constitutional: Positive for fatigue. Negative for chills and  fever.  HENT: Positive for congestion, postnasal drip and rhinorrhea.   Respiratory: Positive for cough, shortness of breath and wheezing. Negative for chest tightness.   Cardiovascular: Negative for chest pain, palpitations and leg swelling.  Genitourinary: Positive for difficulty urinating and frequency.  Neurological: Negative for dizziness.  All other systems reviewed and are negative.      Objective:   Physical Exam  Constitutional: He is oriented to person, place, and time. He appears well-developed and well-nourished. No distress.  HENT:  Mouth/Throat: No oropharyngeal exudate.  Eyes: EOM are normal.  Neck: Neck supple.  Cardiovascular: Normal rate, regular rhythm and normal heart sounds.  No murmur heard. Pulmonary/Chest: No stridor. No respiratory distress. He has wheezes. He has rales.  Musculoskeletal: Normal range of motion. He exhibits no edema.  Neurological: He is alert and oriented to person, place, and time.  Skin: Skin is warm. He is not diaphoretic.  Psychiatric: He has a normal mood and affect.   There were no vitals taken for this visit.   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 Patient's regimen seems to be at maximum level of therapy. Patient has chronic UTi and currently on oral abx with Levaquin   1.Shortness of breath and dyspnea on exertion with wheezing related to COPD. Acute on chronic hypoxic respiratory failure with AECOPD, acute bronchitis.  -continue inhaled medications as prescribed from recent hospitalization.  Use nebulized brovana and  pulmicort twice daily.  Stop symbicort, stop proair, stop ventolin.  Use nebulized xopenex (levalbuterol) as needed.  -Azithromycin  was post discharge stopped due to concomitant use of amiodarone. -Patient remains at high risk of decompensation, rehospitalization, death. - Patient was noted to have significant ambulatory hypoxia, he was started on 2 L of oxygen with ambulation and at rest.  Is also been sent for a overnight oximetry to see if he qualifies for nighttime oxygen.  -Given his recurrent infections we discussed possible bronchoscopy to rule out resistant infection.   Given his continued decline in multiple exacerbations, he would be at higher risk of complications, therefore I will check a sputum culture to look for any evidence of persistent bacterial infection.  This is negative and his status continues to decline we can always readdress the possibility of bronchoscopy again in the future. Over 40 minutes spent with the patient today in counseling, discussing medications.  2.thrush. -  Given prescription for Magic mouthwash.  3.Deconditioned state -Recommend increased daily activity and exercise  Chronic rhinitis, not improved with Flonase. -Given prescription for nasal ipratropium to be used 3 times daily.  Excessive daytime sleepiness. - Epworth score is 11 today we will send for sleep study given symptoms and signs of obstructive sleep apnea.   No orders of the defined types were placed in this encounter.  No orders of the defined types were placed in this encounter.  No follow-ups on file.

## 2017-04-24 ENCOUNTER — Other Ambulatory Visit: Payer: Self-pay | Admitting: *Deleted

## 2017-04-24 ENCOUNTER — Telehealth: Payer: Self-pay | Admitting: Internal Medicine

## 2017-04-24 ENCOUNTER — Ambulatory Visit: Payer: Medicare Other | Admitting: Internal Medicine

## 2017-04-24 ENCOUNTER — Telehealth: Payer: Self-pay | Admitting: *Deleted

## 2017-04-24 DIAGNOSIS — I509 Heart failure, unspecified: Secondary | ICD-10-CM

## 2017-04-24 NOTE — Telephone Encounter (Signed)
lmtcb x1 for pt. 

## 2017-04-24 NOTE — Telephone Encounter (Signed)
Patient called to check on appt 3/26  That he thought was a sleep study.    Patient says he was told this at the hospital when being discharged.  He declined to r/s due to the visit being ov not sleep study   Patient states he doesn't need anything else if I cant schedule a sleep study   When asked if a msg should be sent to the nurse to discuss patient states "whatever makes you happy"

## 2017-04-24 NOTE — Consult Note (Signed)
   [facility] Inpatient Consult   04/24/2017  Donald Juarez 10/15/1945 [id number]   This liaison was contacted by inpatient case manger about offering [facility] services to this patient. Unfortunately, the patient discharged to home over the weekend before I was able to contact him. Order sent through [place] [facility] main office for community care manager to outreach patient for potential services. For questions or concerns please contact:  Rasheida Broden RN, [facility] Liaison  [phone]) Business Mobile [phone]) Toll free office

## 2017-04-25 ENCOUNTER — Other Ambulatory Visit: Payer: Self-pay | Admitting: *Deleted

## 2017-04-25 NOTE — Telephone Encounter (Signed)
Left detailed message that patient's ins will not cover cost of sleep study if not order during an office visit. He may call back to reschedule or his pcp may be willing to order sleep study for him. Nothing further needed.

## 2017-04-25 NOTE — Patient Outreach (Signed)
04/25/2017   Donald Juarez 04-10-1945 [id number]  Transition of care call Referral date: 04/24/2017 Referral source: [facility] hospital liaison Insurance: [facility]   Unsuccessful telephone encounter to Donald Juarez, 72 year old male-  Follow up on referral from [facility] hospital liaison to outreach to pt/ Offer [facility] services as unable to contact,discharged home over the  Weekend.   Pt had  5 admits in 6 months, most recent hospitalization   March 19-24,2019 for  Acute respiratory failure.   Informed pt refused  SNF, not managing well at home.  Pt's history includes but not limited  To COPD,Typical atrial flutter,Hypertension, Paroxysmal atrial fib., CAD, NSTEMI.     HIPAA compliant voice message left with RN CM's contact information.   Plan:  If no response, plan to follow up again tomorrow.   Donald Juarez.   [facility]  [phone]

## 2017-04-26 ENCOUNTER — Ambulatory Visit: Payer: Medicare Other | Admitting: Family

## 2017-04-26 ENCOUNTER — Other Ambulatory Visit: Payer: Self-pay | Admitting: *Deleted

## 2017-04-26 NOTE — Patient Outreach (Signed)
04/26/2017   Second unsuccessful telephone encounter to Donald Juarez, 72 year old male- Follow up on referral from [facility] hospital liaison to outreach to pt/offer [facility] services as unable she  Was unable to contact pt/discharged home over the weekend.  Pt had 5 admits in 6 months,  Most recent  Hospitalization March 19-24,2019 for acute respiratory failure, informed pt  Refused SNF/not safe managing well at home.  Pt's history includes but not limited to  COPD, typical atrial flutter, Hypertension,paroxysmal atrial fib, CAD, NSTEMI.   HIPAA compliant voice message left with RN CM's contact information.   Plan:  If no response to voice message left, plan to follow up with son Donald Juarez (Listed  in EMR designated party release) tomorrow telephonically.    Zara Chess.   [place] [facility]  [phone]

## 2017-04-27 ENCOUNTER — Other Ambulatory Visit: Payer: Self-pay | Admitting: *Deleted

## 2017-04-27 NOTE — Patient Outreach (Addendum)
Successful telephone encounter to son Jamarl Pew in EMR designated party- form signed 04/04/2017).  Spoke with son, HIPAA identifiers verified on pt, discussed purpose of call unable to contact pt - following  up on referral from [place] liaison to contact pt for  [facility] services- follow up on recent hospitalization.  RN CM discussed [facility] services, benefit of his insurance, provide weekly calls/a home visit at no cost to pt.  Son reports pt currently has a nurse that is coming to which RN CM discussed the differences, do not interfere with each other.   Son reports pt lives alone, he is the closest family member and  lives in [place], just left pt and other son from  Out of town is with him now.  Son  reports pt  is taking all his medications, put on oxygen which he is wearing.  Son agreed to [place] for pt, thought it would be good for him, suggested RN CM call pt back next week when other son is still there, let pt know Roderic Palau thought it would be good for him.   RN CM provided son with contact number to call if needed.   Plan: RN CM to follow up with pt next week telephonically- follow up  On referral.     Zara Chess.   [place]  [phone]

## 2017-04-27 NOTE — Patient Outreach (Signed)
This encounter was created in error - please disregard.

## 2017-04-30 ENCOUNTER — Inpatient Hospital Stay
Admission: EM | Admit: 2017-04-30 | Discharge: 2017-05-01 | DRG: 190 | Disposition: A | Discharge status: Skilled Nursing Facility | Payer: Medicare Other | Attending: Family Medicine | Admitting: Family Medicine

## 2017-04-30 ENCOUNTER — Encounter: Payer: Self-pay | Admitting: Emergency Medicine

## 2017-04-30 ENCOUNTER — Other Ambulatory Visit: Payer: Self-pay

## 2017-04-30 ENCOUNTER — Ambulatory Visit: Payer: Medicare Other | Admitting: *Deleted

## 2017-04-30 ENCOUNTER — Emergency Department: Payer: Medicare Other

## 2017-04-30 DIAGNOSIS — F419 Anxiety disorder, unspecified: Secondary | ICD-10-CM | POA: Diagnosis not present

## 2017-04-30 DIAGNOSIS — I5023 Acute on chronic systolic (congestive) heart failure: Secondary | ICD-10-CM | POA: Diagnosis not present

## 2017-04-30 DIAGNOSIS — J9620 Acute and chronic respiratory failure, unspecified whether with hypoxia or hypercapnia: Secondary | ICD-10-CM | POA: Diagnosis not present

## 2017-04-30 DIAGNOSIS — Z85048 Personal history of other malignant neoplasm of rectum, rectosigmoid junction, and anus: Secondary | ICD-10-CM

## 2017-04-30 DIAGNOSIS — Z66 Do not resuscitate: Secondary | ICD-10-CM | POA: Diagnosis present

## 2017-04-30 DIAGNOSIS — J9622 Acute and chronic respiratory failure with hypercapnia: Secondary | ICD-10-CM | POA: Diagnosis not present

## 2017-04-30 DIAGNOSIS — Z7951 Long term (current) use of inhaled steroids: Secondary | ICD-10-CM

## 2017-04-30 DIAGNOSIS — Z7982 Long term (current) use of aspirin: Secondary | ICD-10-CM

## 2017-04-30 DIAGNOSIS — J9811 Atelectasis: Secondary | ICD-10-CM | POA: Diagnosis not present

## 2017-04-30 DIAGNOSIS — Z951 Presence of aortocoronary bypass graft: Secondary | ICD-10-CM

## 2017-04-30 DIAGNOSIS — N390 Urinary tract infection, site not specified: Secondary | ICD-10-CM | POA: Diagnosis present

## 2017-04-30 DIAGNOSIS — Z7952 Long term (current) use of systemic steroids: Secondary | ICD-10-CM

## 2017-04-30 DIAGNOSIS — K219 Gastro-esophageal reflux disease without esophagitis: Secondary | ICD-10-CM | POA: Diagnosis not present

## 2017-04-30 DIAGNOSIS — I482 Chronic atrial fibrillation: Secondary | ICD-10-CM | POA: Diagnosis not present

## 2017-04-30 DIAGNOSIS — J811 Chronic pulmonary edema: Secondary | ICD-10-CM | POA: Diagnosis not present

## 2017-04-30 DIAGNOSIS — Z955 Presence of coronary angioplasty implant and graft: Secondary | ICD-10-CM

## 2017-04-30 DIAGNOSIS — I5041 Acute combined systolic (congestive) and diastolic (congestive) heart failure: Secondary | ICD-10-CM

## 2017-04-30 DIAGNOSIS — R0603 Acute respiratory distress: Secondary | ICD-10-CM | POA: Diagnosis present

## 2017-04-30 DIAGNOSIS — Z96651 Presence of right artificial knee joint: Secondary | ICD-10-CM | POA: Diagnosis not present

## 2017-04-30 DIAGNOSIS — Z7189 Other specified counseling: Secondary | ICD-10-CM

## 2017-04-30 DIAGNOSIS — J9621 Acute and chronic respiratory failure with hypoxia: Secondary | ICD-10-CM | POA: Diagnosis not present

## 2017-04-30 DIAGNOSIS — Z7901 Long term (current) use of anticoagulants: Secondary | ICD-10-CM

## 2017-04-30 DIAGNOSIS — I110 Hypertensive heart disease with heart failure: Secondary | ICD-10-CM | POA: Diagnosis present

## 2017-04-30 DIAGNOSIS — J441 Chronic obstructive pulmonary disease with (acute) exacerbation: Principal | ICD-10-CM

## 2017-04-30 DIAGNOSIS — B952 Enterococcus as the cause of diseases classified elsewhere: Secondary | ICD-10-CM | POA: Diagnosis present

## 2017-04-30 DIAGNOSIS — Z961 Presence of intraocular lens: Secondary | ICD-10-CM | POA: Diagnosis present

## 2017-04-30 DIAGNOSIS — E785 Hyperlipidemia, unspecified: Secondary | ICD-10-CM | POA: Diagnosis not present

## 2017-04-30 DIAGNOSIS — Z8546 Personal history of malignant neoplasm of prostate: Secondary | ICD-10-CM

## 2017-04-30 DIAGNOSIS — I2510 Atherosclerotic heart disease of native coronary artery without angina pectoris: Secondary | ICD-10-CM | POA: Diagnosis not present

## 2017-04-30 DIAGNOSIS — Z515 Encounter for palliative care: Secondary | ICD-10-CM

## 2017-04-30 DIAGNOSIS — J9692 Respiratory failure, unspecified with hypercapnia: Secondary | ICD-10-CM

## 2017-04-30 DIAGNOSIS — I252 Old myocardial infarction: Secondary | ICD-10-CM

## 2017-04-30 DIAGNOSIS — Z79899 Other long term (current) drug therapy: Secondary | ICD-10-CM

## 2017-04-30 DIAGNOSIS — J209 Acute bronchitis, unspecified: Secondary | ICD-10-CM | POA: Diagnosis present

## 2017-04-30 DIAGNOSIS — J440 Chronic obstructive pulmonary disease with acute lower respiratory infection: Secondary | ICD-10-CM

## 2017-04-30 DIAGNOSIS — Z8249 Family history of ischemic heart disease and other diseases of the circulatory system: Secondary | ICD-10-CM

## 2017-04-30 DIAGNOSIS — Z87891 Personal history of nicotine dependence: Secondary | ICD-10-CM

## 2017-04-30 DIAGNOSIS — Z9842 Cataract extraction status, left eye: Secondary | ICD-10-CM

## 2017-04-30 DIAGNOSIS — J9691 Respiratory failure, unspecified with hypoxia: Secondary | ICD-10-CM | POA: Diagnosis present

## 2017-04-30 DIAGNOSIS — Z888 Allergy status to other drugs, medicaments and biological substances status: Secondary | ICD-10-CM

## 2017-04-30 LAB — URINALYSIS, COMPLETE (UACMP) WITH MICROSCOPIC
BACTERIA UA: NONE SEEN
BILIRUBIN URINE: NEGATIVE
Glucose, UA: NEGATIVE mg/dL
KETONES UR: NEGATIVE mg/dL
Nitrite: NEGATIVE
PROTEIN: 100 mg/dL — AB
Specific Gravity, Urine: 1.02 (ref 1.005–1.030)
pH: 6 (ref 5.0–8.0)

## 2017-04-30 LAB — COMPREHENSIVE METABOLIC PANEL
ALBUMIN: 3 g/dL — AB (ref 3.5–5.0)
ALK PHOS: 121 U/L (ref 38–126)
ALT: 48 U/L (ref 17–63)
AST: 63 U/L — ABNORMAL HIGH (ref 15–41)
Anion gap: 15 (ref 5–15)
BUN: 29 mg/dL — ABNORMAL HIGH (ref 6–20)
CALCIUM: 7.9 mg/dL — AB (ref 8.9–10.3)
CO2: 21 mmol/L — ABNORMAL LOW (ref 22–32)
CREATININE: 1.23 mg/dL (ref 0.61–1.24)
Chloride: 104 mmol/L (ref 101–111)
GFR calc Af Amer: >60 mL/min (ref >60)
GFR calc non Af Amer: 57 mL/min — ABNORMAL LOW (ref >60)
GLUCOSE: 149 mg/dL — AB (ref 65–99)
Potassium: 4 mmol/L (ref 3.5–5.1)
SODIUM: 140 mmol/L (ref 135–145)
Total Bilirubin: 0.5 mg/dL (ref 0.3–1.2)
Total Protein: 6.6 g/dL (ref 6.5–8.1)

## 2017-04-30 LAB — BLOOD GAS, VENOUS
Acid-base deficit: 3.1 mmol/L — ABNORMAL HIGH (ref 0.0–2.0)
BICARBONATE: 23.6 mmol/L (ref 20.0–28.0)
DELIVERY SYSTEMS: POSITIVE
FIO2: 0.3
O2 SAT: 86.7 %
PATIENT TEMPERATURE: 37
PO2 VEN: 59 mmHg — AB (ref 32.0–45.0)
pCO2, Ven: 49 mmHg (ref 44.0–60.0)
pH, Ven: 7.29 (ref 7.250–7.430)

## 2017-04-30 LAB — CBC WITH DIFFERENTIAL/PLATELET
Basophils Absolute: 0.1 10*3/uL (ref 0–0.1)
Basophils Relative: 0 %
EOS PCT: 2 %
Eosinophils Absolute: 0.2 10*3/uL (ref 0–0.7)
HCT: 33 % — ABNORMAL LOW (ref 40.0–52.0)
Hemoglobin: 10.6 g/dL — ABNORMAL LOW (ref 13.0–18.0)
LYMPHS ABS: 1 10*3/uL (ref 1.0–3.6)
LYMPHS PCT: 7 %
MCH: 28.3 pg (ref 26.0–34.0)
MCHC: 32.1 g/dL (ref 32.0–36.0)
MCV: 88.1 fL (ref 80.0–100.0)
MONO ABS: 0.9 10*3/uL (ref 0.2–1.0)
MONOS PCT: 7 %
Neutro Abs: 12.1 10*3/uL — ABNORMAL HIGH (ref 1.4–6.5)
Neutrophils Relative %: 84 %
PLATELETS: 274 10*3/uL (ref 150–440)
RBC: 3.74 MIL/uL — ABNORMAL LOW (ref 4.40–5.90)
RDW: 16 % — AB (ref 11.5–14.5)
WBC: 14.3 10*3/uL — ABNORMAL HIGH (ref 3.8–10.6)

## 2017-04-30 LAB — PREALBUMIN: PREALBUMIN: 20.4 mg/dL (ref 18–38)

## 2017-04-30 LAB — BRAIN NATRIURETIC PEPTIDE: B Natriuretic Peptide: 769 pg/mL — ABNORMAL HIGH (ref 0.0–100.0)

## 2017-04-30 LAB — INFLUENZA PANEL BY PCR (TYPE A & B)
INFLAPCR: NEGATIVE
Influenza B By PCR: NEGATIVE

## 2017-04-30 LAB — LACTIC ACID, PLASMA
Lactic Acid, Venous: 2.5 mmol/L (ref 0.5–1.9)
Lactic Acid, Venous: 1.8 mmol/L (ref 0.5–1.9)

## 2017-04-30 LAB — TROPONIN I: Troponin I: 0.03 ng/mL (ref <0.03)

## 2017-04-30 MED ORDER — AZITHROMYCIN 250 MG PO TABS: ORAL_TABLET | ORAL | 0 refills | Status: DC

## 2017-04-30 MED ORDER — NITROGLYCERIN 0.4 MG SL SUBL: 0.4000 mg | SUBLINGUAL_TABLET | SUBLINGUAL | Status: DC | PRN

## 2017-04-30 MED ORDER — ONDANSETRON HCL 4 MG PO TABS: 4.0000 mg | ORAL_TABLET | Freq: Four times a day (QID) | ORAL | Status: DC | PRN

## 2017-04-30 MED ORDER — MECLIZINE HCL 12.5 MG PO TABS
12.5000 mg | ORAL_TABLET | Freq: Three times a day (TID) | ORAL | Status: DC | PRN
  Filled 2017-04-30: qty 2

## 2017-04-30 MED ORDER — PREMIER PROTEIN SHAKE
11.0000 [oz_av] | ORAL | Status: DC
  Administered 2017-05-01: 11 [oz_av] via ORAL

## 2017-04-30 MED ORDER — MAGNESIUM SULFATE 2 GM/50ML IV SOLN
2.0000 g | Freq: Once | INTRAVENOUS | Status: AC
  Administered 2017-04-30: 2 g via INTRAVENOUS
  Filled 2017-04-30: qty 50

## 2017-04-30 MED ORDER — SODIUM CHLORIDE 0.9 % IV BOLUS
500.0000 mL | Freq: Once | INTRAVENOUS | Status: AC
  Administered 2017-04-30: 500 mL via INTRAVENOUS

## 2017-04-30 MED ORDER — BENZONATATE 100 MG PO CAPS: 100.0000 mg | ORAL_CAPSULE | Freq: Three times a day (TID) | ORAL | Status: DC | PRN

## 2017-04-30 MED ORDER — ALPRAZOLAM 0.25 MG PO TABS: 0.2500 mg | ORAL_TABLET | Freq: Two times a day (BID) | ORAL | Status: DC | PRN

## 2017-04-30 MED ORDER — ALBUTEROL SULFATE (2.5 MG/3ML) 0.083% IN NEBU
2.5000 mg | INHALATION_SOLUTION | Freq: Once | RESPIRATORY_TRACT | Status: AC
  Administered 2017-04-30: 2.5 mg via RESPIRATORY_TRACT

## 2017-04-30 MED ORDER — PAROXETINE HCL 30 MG PO TABS
30.0000 mg | ORAL_TABLET | Freq: Every day | ORAL | Status: DC
  Administered 2017-04-30 – 2017-05-01 (×2): 30 mg via ORAL
  Filled 2017-04-30 (×2): qty 1

## 2017-04-30 MED ORDER — PANTOPRAZOLE SODIUM 40 MG PO TBEC
40.0000 mg | DELAYED_RELEASE_TABLET | Freq: Every day | ORAL | Status: DC
  Administered 2017-04-30 – 2017-05-01 (×2): 40 mg via ORAL
  Filled 2017-04-30 (×2): qty 1

## 2017-04-30 MED ORDER — LISINOPRIL 5 MG PO TABS
5.0000 mg | ORAL_TABLET | Freq: Every day | ORAL | Status: DC
  Administered 2017-04-30: 11:00:00 5 mg via ORAL
  Filled 2017-04-30: qty 1

## 2017-04-30 MED ORDER — SODIUM CHLORIDE 0.9 % IV SOLN
2.0000 g | Freq: Three times a day (TID) | INTRAVENOUS | Status: DC
  Administered 2017-04-30 – 2017-05-01 (×3): 2 g via INTRAVENOUS
  Filled 2017-04-30 (×5): qty 2

## 2017-04-30 MED ORDER — ASPIRIN EC 81 MG PO TBEC
81.0000 mg | DELAYED_RELEASE_TABLET | Freq: Every day | ORAL | Status: DC
  Administered 2017-04-30 – 2017-05-01 (×2): 81 mg via ORAL
  Filled 2017-04-30 (×2): qty 1

## 2017-04-30 MED ORDER — ACETAMINOPHEN 650 MG RE SUPP: 650.0000 mg | Freq: Four times a day (QID) | RECTAL | Status: DC | PRN

## 2017-04-30 MED ORDER — ORAL CARE MOUTH RINSE
15.0000 mL | Freq: Two times a day (BID) | OROMUCOSAL | Status: DC
  Administered 2017-04-30: 21:00:00 15 mL via OROMUCOSAL

## 2017-04-30 MED ORDER — METOPROLOL TARTRATE 25 MG PO TABS
12.5000 mg | ORAL_TABLET | Freq: Two times a day (BID) | ORAL | Status: DC
Start: 2017-04-30 — End: 2017-05-01
  Administered 2017-04-30 – 2017-05-01 (×3): 12.5 mg via ORAL
  Filled 2017-04-30 (×3): qty 1

## 2017-04-30 MED ORDER — APIXABAN 5 MG PO TABS
5.0000 mg | ORAL_TABLET | Freq: Two times a day (BID) | ORAL | Status: DC
  Administered 2017-05-01: 11:00:00 5 mg via ORAL
  Filled 2017-04-30: qty 1

## 2017-04-30 MED ORDER — SODIUM CHLORIDE 0.9% FLUSH: 3.0000 mL | INTRAVENOUS | Status: DC | PRN

## 2017-04-30 MED ORDER — BUDESONIDE 0.5 MG/2ML IN SUSP
0.5000 mg | Freq: Two times a day (BID) | RESPIRATORY_TRACT | Status: DC
  Administered 2017-04-30: 0.5 mg via RESPIRATORY_TRACT
  Filled 2017-04-30: qty 2

## 2017-04-30 MED ORDER — ONDANSETRON HCL 4 MG/2ML IJ SOLN: 4.0000 mg | Freq: Four times a day (QID) | INTRAMUSCULAR | Status: DC | PRN

## 2017-04-30 MED ORDER — SODIUM CHLORIDE 0.9% FLUSH
3.0000 mL | Freq: Two times a day (BID) | INTRAVENOUS | Status: DC
  Administered 2017-04-30 – 2017-05-01 (×3): 3 mL via INTRAVENOUS

## 2017-04-30 MED ORDER — ALBUTEROL SULFATE (2.5 MG/3ML) 0.083% IN NEBU
INHALATION_SOLUTION | RESPIRATORY_TRACT | Status: AC
  Filled 2017-04-30: qty 15

## 2017-04-30 MED ORDER — ATORVASTATIN CALCIUM 20 MG PO TABS
80.0000 mg | ORAL_TABLET | Freq: Every day | ORAL | Status: DC
  Administered 2017-04-30 – 2017-05-01 (×2): 80 mg via ORAL
  Filled 2017-04-30 (×2): qty 4

## 2017-04-30 MED ORDER — FUROSEMIDE 10 MG/ML IJ SOLN
40.0000 mg | Freq: Two times a day (BID) | INTRAMUSCULAR | Status: DC
  Administered 2017-04-30 – 2017-05-01 (×3): 40 mg via INTRAVENOUS
  Filled 2017-04-30 (×3): qty 4

## 2017-04-30 MED ORDER — ACETAMINOPHEN 325 MG PO TABS: 650.0000 mg | ORAL_TABLET | Freq: Four times a day (QID) | ORAL | Status: DC | PRN

## 2017-04-30 MED ORDER — VITAMIN D 1000 UNITS PO TABS
2000.0000 [IU] | ORAL_TABLET | Freq: Every day | ORAL | Status: DC
  Administered 2017-04-30 – 2017-05-01 (×2): 2000 [IU] via ORAL
  Filled 2017-04-30 (×2): qty 2

## 2017-04-30 MED ORDER — SODIUM CHLORIDE 0.9 % IV SOLN: 250.0000 mL | INTRAVENOUS | Status: DC | PRN

## 2017-04-30 MED ORDER — ARFORMOTEROL TARTRATE 15 MCG/2ML IN NEBU
15.0000 ug | INHALATION_SOLUTION | Freq: Two times a day (BID) | RESPIRATORY_TRACT | Status: DC
Start: 2017-04-30 — End: 2017-05-01
  Administered 2017-04-30 – 2017-05-01 (×2): 15 ug via RESPIRATORY_TRACT
  Filled 2017-04-30 (×3): qty 2

## 2017-04-30 MED ORDER — FUROSEMIDE 10 MG/ML IJ SOLN
20.0000 mg | Freq: Once | INTRAMUSCULAR | Status: AC
  Administered 2017-04-30: 20 mg via INTRAVENOUS
  Filled 2017-04-30: qty 4

## 2017-04-30 MED ORDER — IPRATROPIUM-ALBUTEROL 0.5-2.5 (3) MG/3ML IN SOLN
3.0000 mL | Freq: Once | RESPIRATORY_TRACT | Status: AC
  Administered 2017-04-30: 3 mL via RESPIRATORY_TRACT
  Filled 2017-04-30: qty 3

## 2017-04-30 MED ORDER — HYDROCODONE-ACETAMINOPHEN 5-325 MG PO TABS: 1.0000 | ORAL_TABLET | ORAL | Status: DC | PRN

## 2017-04-30 MED ORDER — IPRATROPIUM BROMIDE 0.02 % IN SOLN
0.5000 mg | Freq: Four times a day (QID) | RESPIRATORY_TRACT | Status: DC
  Administered 2017-04-30 – 2017-05-01 (×3): 0.5 mg via RESPIRATORY_TRACT
  Filled 2017-04-30 (×4): qty 2.5

## 2017-04-30 MED ORDER — SENNOSIDES-DOCUSATE SODIUM 8.6-50 MG PO TABS: 1.0000 | ORAL_TABLET | Freq: Every evening | ORAL | Status: DC | PRN

## 2017-04-30 MED ORDER — AMIODARONE HCL 200 MG PO TABS
200.0000 mg | ORAL_TABLET | Freq: Every day | ORAL | Status: DC
  Administered 2017-04-30 – 2017-05-01 (×2): 200 mg via ORAL
  Filled 2017-04-30 (×2): qty 1

## 2017-04-30 MED ORDER — BUDESONIDE 0.5 MG/2ML IN SUSP
0.5000 mg | Freq: Two times a day (BID) | RESPIRATORY_TRACT | Status: DC
Start: 2017-04-30 — End: 2017-05-01
  Administered 2017-04-30 – 2017-05-01 (×2): 0.5 mg via RESPIRATORY_TRACT
  Filled 2017-04-30 (×2): qty 2

## 2017-04-30 MED ORDER — ALBUTEROL SULFATE (2.5 MG/3ML) 0.083% IN NEBU
5.0000 mg | INHALATION_SOLUTION | Freq: Once | RESPIRATORY_TRACT | Status: AC
  Administered 2017-04-30: 5 mg via RESPIRATORY_TRACT

## 2017-04-30 MED ORDER — BISACODYL 5 MG PO TBEC: 5.0000 mg | DELAYED_RELEASE_TABLET | Freq: Every day | ORAL | Status: DC | PRN

## 2017-04-30 MED ORDER — METHYLPREDNISOLONE SODIUM SUCC 125 MG IJ SOLR
60.0000 mg | Freq: Four times a day (QID) | INTRAMUSCULAR | Status: DC
  Administered 2017-04-30 – 2017-05-01 (×5): 60 mg via INTRAVENOUS
  Filled 2017-04-30 (×5): qty 2

## 2017-04-30 MED ORDER — ALBUTEROL SULFATE (2.5 MG/3ML) 0.083% IN NEBU: 15.0000 mg | INHALATION_SOLUTION | Freq: Once | RESPIRATORY_TRACT | Status: DC

## 2017-04-30 MED ORDER — GUAIFENESIN ER 600 MG PO TB12
600.0000 mg | ORAL_TABLET | Freq: Two times a day (BID) | ORAL | Status: DC
  Administered 2017-05-01: 11:00:00 600 mg via ORAL
  Filled 2017-04-30 (×3): qty 1

## 2017-04-30 MED ORDER — METOPROLOL TARTRATE 5 MG/5ML IV SOLN
2.5000 mg | Freq: Four times a day (QID) | INTRAVENOUS | Status: DC | PRN
  Administered 2017-04-30: 2.5 mg via INTRAVENOUS
  Filled 2017-04-30: qty 5

## 2017-04-30 MED ORDER — FLEET ENEMA 7-19 GM/118ML RE ENEM: 1.0000 | ENEMA | Freq: Once | RECTAL | Status: DC | PRN

## 2017-04-30 MED ORDER — DOCUSATE SODIUM 100 MG PO CAPS
100.0000 mg | ORAL_CAPSULE | Freq: Two times a day (BID) | ORAL | Status: DC
  Administered 2017-04-30 – 2017-05-01 (×2): 100 mg via ORAL
  Filled 2017-04-30 (×3): qty 1

## 2017-04-30 MED ORDER — LISINOPRIL 5 MG PO TABS
2.5000 mg | ORAL_TABLET | Freq: Every day | ORAL | Status: DC
  Administered 2017-05-01: 2.5 mg via ORAL
  Filled 2017-04-30: qty 1

## 2017-04-30 MED ORDER — IPRATROPIUM BROMIDE 0.06 % NA SOLN
2.0000 | Freq: Four times a day (QID) | NASAL | Status: DC
  Administered 2017-04-30 (×4): 2 via NASAL
  Filled 2017-04-30: qty 15

## 2017-04-30 MED ORDER — IPRATROPIUM BROMIDE 0.02 % IN SOLN
0.5000 mg | Freq: Four times a day (QID) | RESPIRATORY_TRACT | Status: DC
  Administered 2017-04-30: 0.5 mg via RESPIRATORY_TRACT
  Filled 2017-04-30: qty 2.5

## 2017-04-30 MED ORDER — PREDNISONE 50 MG PO TABS: 50.0000 mg | ORAL_TABLET | Freq: Every day | ORAL | 0 refills | Status: AC

## 2017-04-30 MED ORDER — ARFORMOTEROL TARTRATE 15 MCG/2ML IN NEBU
15.0000 ug | INHALATION_SOLUTION | Freq: Two times a day (BID) | RESPIRATORY_TRACT | Status: DC
  Administered 2017-04-30: 11:00:00 15 ug via RESPIRATORY_TRACT
  Filled 2017-04-30: qty 2

## 2017-04-30 MED ORDER — LEVALBUTEROL HCL 0.63 MG/3ML IN NEBU
0.6300 mg | INHALATION_SOLUTION | Freq: Four times a day (QID) | RESPIRATORY_TRACT | Status: DC
  Administered 2017-04-30 – 2017-05-01 (×4): 0.63 mg via RESPIRATORY_TRACT
  Filled 2017-04-30 (×5): qty 3

## 2017-04-30 MED ORDER — AZITHROMYCIN 500 MG PO TABS
500.0000 mg | ORAL_TABLET | Freq: Once | ORAL | Status: AC
  Administered 2017-04-30: 500 mg via ORAL
  Filled 2017-04-30: qty 1

## 2017-04-30 NOTE — ED Notes (Addendum)
Pt weaned off of Bi-Pap and onto 2L nasal canula by MD Rifenbark.

## 2017-04-30 NOTE — Progress Notes (Signed)
Patient has been weaned off bipap to nasal cannula tolerating well.

## 2017-04-30 NOTE — Progress Notes (Signed)
Notified Dr Jerelyn Charles of pt's HR fluctuates 100-125, A.fib, pacemaker not seen on the monitor. BP 119/93. Pt denies any distress. MD to place orders.

## 2017-04-30 NOTE — Progress Notes (Signed)
Initial Nutrition Assessment  DOCUMENTATION CODES:   Not applicable  INTERVENTION:  Provide Premier Protein po once daily, each supplement provides 160 kcal and 30 grams of protein. Patient prefers chocolate.  Reinforced main points from heart failure nutrition therapy (low-sodium diet, fluid restriction) that was provided to patient during last admission. Patient verbalized understanding of diet. Patient was drinking 6 fl oz of ice water at time of RD assessment. Discussed need for maximizing protein intake and encouraged him to drink milk instead of water when possible to get some more protein. On last admission had provided patient with Premier Protein BID but now he is on a 1 L fluid restriction. Also encouraged him to choose a good source of protein at each meal.  NUTRITION DIAGNOSIS:   Inadequate oral intake related to decreased appetite, social / environmental circumstances as evidenced by per patient/family report.  GOAL:   Patient will meet greater than or equal to 90% of their needs  MONITOR:   PO intake, Supplement acceptance, Labs, Weight trends, I & O's  REASON FOR ASSESSMENT:   Malnutrition Screening Tool, Consult Assessment of nutrition requirement/status  ASSESSMENT:   72 year old male with PMHx of COPD, carotid stenosis, HTN, paroxysmal atrial flutter, CAD, HLD, shingles, hx MI, anxiety, seizures, hx rectal cancer s/p colostomy, hx prostate cancer who is now admitted with acute exacerbation of COPD, acute bronchitis, and CHF exacerbation.   Met with patient at bedside. He is known to this RD from previous admission on 3/20. His appetite has been decreased for a few months since his CABG. He has trouble with cooking at home and is too tired to cook. At home he has mostly been eating snacks or sandwiches and he "eats when he feels like eating." He reports his appetite is pretty good here. He ate well at his lunch. He reports that he likes having a bedtime snack of  graham crackers, peanut butter, and 1 cup water. Encouraged patient to drink some milk instead of water when possible to help maximize protein intake since he is on a fluid restriction. He denies any difficulty chewing/swallowing even though he is edentulous, N/V, or abdominal pain.  UBW 215-220 lbs. Patient was 219.8 lbs on 02/07/2017 and had lost 16.1 lbs (7.3% body weight) over 2 months, which is significant for time frame. Unsure if weight from this admission is a true measured weight.  Meal Completion: 100% of lunch today per chart (759 kcal, 27 grams of protein)  Medications reviewed and include: amiodarone 200 mg daily, Eliquis, vitamin D 2000 units daily, Colace, Lasix 40 mg BID, Solu-Medrol 60 mg Q6hrs IV, pantoprazole.  Labs reviewed: CO2 21, BNP 769.  Patient is at risk for malnutrition.  NUTRITION - FOCUSED PHYSICAL EXAM:    Most Recent Value  Orbital Region  No depletion  Upper Arm Region  Mild depletion  Thoracic and Lumbar Region  No depletion  Buccal Region  No depletion  Temple Region  No depletion  Clavicle Bone Region  No depletion  Clavicle and Acromion Bone Region  No depletion  Scapular Bone Region  No depletion  Dorsal Hand  No depletion  Patellar Region  Mild depletion  Anterior Thigh Region  Mild depletion  Posterior Calf Region  Moderate depletion  Edema (RD Assessment)  None  Hair  Reviewed  Eyes  Reviewed  Mouth  Reviewed  Skin  Reviewed [ecchymosis]  Nails  Reviewed     Diet Order:  Seizure precautions Aspiration precautions Fall precautions Diet Heart  Room service appropriate? Yes; Fluid consistency: Thin; Fluid restriction: Other (see comments)  EDUCATION NEEDS:   Education needs have been addressed  Skin:  Skin Assessment: Reviewed RN Assessment(ecchymosis to arms and legs)  Last BM:  04/30/2017; pt has had colostomy since 2003  Height:   Ht Readings from Last 1 Encounters:  04/30/17 5' 9"  (1.753 m)    Weight:   Wt Readings from Last  1 Encounters:  04/30/17 201 lb (91.2 kg)    Ideal Body Weight:  72.7 kg  BMI:  Body mass index is 29.68 kg/m.  Estimated Nutritional Needs:   Kcal:  1990-2160 (MSJ x 1.2-1.3)  Protein:  90-110 grams (1-1.2 grams/kg)  Fluid:  1 L fluid restriction currently ordered  Willey Blade, [place], RD, LDN Office: [phone] Pager: [phone] After Hours/Weekend Pager: [phone]

## 2017-04-30 NOTE — Consult Note (Signed)
Pharmacy Antibiotic Note  Donald Juarez is a 72 y.o. male admitted on 04/30/2017 with Respiratory Distress.  Pharmacy has been consulted for cefepime dosing.  Patient was recently discharged on 3/24. Recent culture results from 3/22 returned positive for Pseudomonas aeruginosa on 3/25, with sensitivity to cefepime. Patient has received no antibiotics since culture result aside from azithromycin on this admission.  Plan:  Initiate IV Cefepime 2g q8h.  Height: 5\' 9"  (175.3 cm) Weight: 201 lb (91.2 kg) IBW/kg (Calculated) : 70.7  Temp (24hrs), Avg:98.4 F (36.9 C), Min:98.4 F (36.9 C), Max:98.4 F (36.9 C)  Recent Labs  Lab 04/30/17 0309 04/30/17 0557  WBC 14.3*  --   CREATININE 1.23  --   LATICACIDVEN 2.5* 1.8    Estimated Creatinine Clearance: 60.6 mL/min (by C-G formula based on SCr of 1.23 mg/dL).    Allergies  Allergen Reactions  . Other     Sleep medications cause hallucinations, agitation per patient's son. Did not know which one.    Antimicrobials this admission: Azithromycin 500 mg tablet PO x1   Microbiology results: 4/1 BCx: Sent 4/1 UCx: Sent 3/22 Sputum: P. aeruginosa (+)    Thank you for allowing pharmacy to be a part of this patient's care.  Brett Fairy 04/30/2017 3:12 PM

## 2017-04-30 NOTE — Progress Notes (Signed)
Family Meeting Note  Advance Directive:yes  Today a meeting took place with the Patient with son.  Patient is able to participate   The following clinical team members were present during this meeting:MD  The following were discussed:Patient's diagnosis: End-stage lung disease, numerous frequent hospitalizations for acute respiratory failure, systolic congestive heart failure, cardiomyopathy, asthma, COPD, coronary artery disease, sick sinus syndrome status post pacer, seizure disorder, coronary artery disease, atrial fibrillation, hyperlipidemia, deconditioning/frailty, Patient's progosis: Unable to determine and Goals for treatment: Full Code/palliative care consultation given numerous frequent hospitalizations for similar presentation  Additional follow-up to be provided: prn  Time spent during discussion:20 minutes  Gorden Harms, MD

## 2017-04-30 NOTE — H&P (Signed)
[facility] [place] NAME: Donald Juarez    MR#:  [id number]  DATE OF BIRTH:  12-21-1945  DATE OF ADMISSION:  04/30/2017  PRIMARY CARE PHYSICIAN: Sofie Hartigan, MD   REQUESTING/REFERRING PHYSICIAN:   CHIEF COMPLAINT:   Chief Complaint  Patient presents with  . Respiratory Distress    HISTORY OF PRESENT ILLNESS: Donald Juarez  is a 72 y.o. male with a known history per below, recently discharged from the hospital with similar presentation 1 week ago, noted numerous frequent hospitalizations for the same thing, presenting to the emergency room via EMS for shortness of breath, chest tightness, dyspnea, patient requiring BiPAP in route via EMS as well as in the emergency room-was successful in being weaned off, chest x-ray noted for mild edema, BNP greater than 700, white count 14,000, urinalysis suspicious for UTI-has chronic indwelling Foley catheter, patient evaluated in the emergency room, son at the bedside, patient states "I do not know what happened-I just got short of breath, " patient is now being admitted for acute on chronic hypoxic hypercapnic respiratory failure secondary to multifactorial process which includes acute on COPD exacerbation > acute on chronic systolic congestive heart failure.  PAST MEDICAL HISTORY:   Past Medical History:  Diagnosis Date  . Anginal pain ([place])    feels this as jaw pain.  takes imdur for this. rarely uses nitro.  [name] Anxiety   . Arthritis   . Asthma   . Bilateral claudication of lower limb ([place])   . Carotid stenosis    a. 07/2013 Carotid U/S: 100% RICA (pt prev reported h/o 29%), LICA 51-88%, bilat ECA 50%, normal vertebrals and subclavians bilat-->F/U needed in 01/2014.  . Cataract of left eye   . Cataract, right    a. 10/2010 s/p cataract surgery   . COPD (chronic obstructive pulmonary disease) ([place])   . Coronary artery disease    a. 06/1996 & 03/2005 Cath: non-obs dzs;  b. 07/2009 NSTEMI/Cath: LCX  99->PCI/DES extending into OM1;  c. 01/2013 Cath: patent LCX stent-->Med Rx.; d. cath 03/2014: RPAV dzs->Med rx; e. 10/2014 Cath/PCI: LM 30, LAD 20p/m, LCX 50ost/p, 10 ISR, 60d, OM1 min irregs, OM2 50, RCA 30ost, 21m, 60d, RPDA 70(2.5x8 Xience DES), RPAV 90small(PTCA); f. 10/2014 Relook Cath: RPAV 99(Med Rx), EF 45-50.  [name] Dysrhythmia    since ablation, flutter resolved.  . Essential hypertension   . Hyperkalemia   . Hyperlipidemia   . Myocardial infarction ([place]) 2015   had stent inserted and then had heart attack after. stent inserted due to blockage  . Paroxysmal atrial flutter ([place])    a. CHA2DS2VASc = 3 -> eliquis;  b. 07/2013 Echo: EF 50-55%, mildly dil LA.c. s/p RFCA 10/31/2013 by Dr Caryl Comes  . Prostate cancer ([place])   . Pulmonary nodule, right    a. 0.7cm RLL - stable by CT 06/2013.  [name] Rectal cancer ([place])    a. Status post colostomy  . Seizures ([place])    a. last 30 years ago  . Shingles    a. 09/2012.  . SSS (sick sinus syndrome) ([place])    a. 12/2009 S/P MDT Adapta ADDR01 DC PPM, ser # [id number] ([name]).  . Syncope and collapse     PAST SURGICAL HISTORY:  Past Surgical History:  Procedure Laterality Date  . ABLATION  10/31/2013   CTI ablation by Dr Caryl Comes for atrial flutter  . ATRIAL FLUTTER ABLATION N/A 10/31/2013   Procedure: ATRIAL FLUTTER ABLATION;  Surgeon: Remo Lipps  Peterson Lombard, MD;  Location: [facility] CATH LAB;  Service: Cardiovascular;  Laterality: N/A;  . CARDIAC CATHETERIZATION  07/2009   [facility]  . CARDIAC CATHETERIZATION  01/2013   [facility]  . CARDIAC CATHETERIZATION  03/2014   [facility]  . CARDIAC CATHETERIZATION N/A 11/02/2014   Procedure: Left Heart Cath and Cors/Grafts Angiography;  Surgeon: [name], MD;  Location: [place] CV LAB;  Service: Cardiovascular;  Laterality: N/A;  . CARDIAC CATHETERIZATION N/A 11/02/2014   Procedure: Coronary Stent Intervention;  Surgeon: [name], MD;  Location: [place] CV LAB;  Service: Cardiovascular;  Laterality: N/A;  . CARDIAC  CATHETERIZATION N/A 11/03/2014   Procedure: Left Heart Cath and Coronary Angiography;  Surgeon: [name], MD;  Location: [place] CV LAB;  Service: Cardiovascular;  Laterality: N/A;  . CARDIAC SURGERY  2011   Stents placed   . CATARACT EXTRACTION Left   . CATARACT EXTRACTION W/PHACO Left 06/01/2014   Procedure: CATARACT EXTRACTION PHACO AND INTRAOCULAR LENS PLACEMENT (IOC);  Surgeon: [name], MD;  Location: [name] ORS;  Service: Ophthalmology;  Laterality: Left;  . cataract surgery  2012  . colon surgery     cancer removal  . COLON SURGERY     colostomy  . COLONOSCOPY WITH PROPOFOL N/A 05/24/2016   Procedure: COLONOSCOPY WITH PROPOFOL;  Surgeon: [name], MD;  Location: [facility] ENDOSCOPY;  Service: Endoscopy;  Laterality: N/A;  . CORONARY ANGIOPLASTY    . INSERT / REPLACE / REMOVE PACEMAKER  2011   just a pacer  . KNEE ARTHROSCOPY Right 1980's  . LEFT HEART CATH AND CORONARY ANGIOGRAPHY N/A 02/08/2017   Procedure: LEFT HEART CATH AND CORONARY ANGIOGRAPHY;  Surgeon: [name], MD;  Location: [place] CV LAB;  Service: Cardiovascular;  Laterality: N/A;  . PACEMAKER PLACEMENT  01/17/2010  . TOTAL KNEE ARTHROPLASTY Right 01/14/2016   Procedure: TOTAL KNEE ARTHROPLASTY;  Surgeon: [name], MD;  Location: [name] ORS;  Service: Orthopedics;  Laterality: Right;    SOCIAL HISTORY:  Social History   Tobacco Use  . Smoking status: Former Smoker    Packs/day: 1.00    Years: 30.00    Pack years: 30.00    Types: Cigarettes    Last attempt to quit: 07/26/2008    Years since quitting: 8.7  . Smokeless tobacco: Never Used  Substance Use Topics  . Alcohol use: No    FAMILY HISTORY:  Family History  Problem Relation Age of Onset  . Cancer Mother        'male cancer"  . Hypertension Mother   . Hyperlipidemia Mother   . Heart disease Father   . Heart attack Father   . Hypertension Father   . Hyperlipidemia Father     DRUG ALLERGIES:  Allergies   Allergen Reactions  . Other     Sleep medications cause hallucinations, agitation per patient's son. Did not know which one.    REVIEW OF SYSTEMS:   CONSTITUTIONAL: No fever, +fatigue, weakness.  EYES: No blurred or double vision.  EARS, NOSE, AND THROAT: No tinnitus or ear pain.  RESPIRATORY: + cough, shortness of breath, wheezing, no hemoptysis.  CARDIOVASCULAR: No chest pain, orthopnea, edema.  GASTROINTESTINAL: No nausea, vomiting, diarrhea or abdominal pain.  GENITOURINARY: No dysuria, hematuria.  ENDOCRINE: No polyuria, nocturia,  HEMATOLOGY: No anemia, easy bruising or bleeding SKIN: No rash or lesion. MUSCULOSKELETAL: No joint pain or arthritis.   NEUROLOGIC: No tingling, numbness, weakness.  PSYCHIATRY: No anxiety or depression.   MEDICATIONS  AT HOME:  Prior to Admission medications   Medication Sig Start Date End Date Taking? Authorizing Provider  ALPRAZolam Duanne Moron) 0.25 MG tablet Take 1 tablet by mouth 2 (two) times daily as needed. 04/04/17  Yes [provider]  amiodarone (PACERONE) 200 MG tablet Take 200 mg by mouth daily.   Yes [provider]  apixaban (ELIQUIS) 5 MG TABS tablet Take 5 mg by mouth 2 (two) times daily. 02/26/17  Yes [provider]  arformoterol (BROVANA) 15 MCG/2ML NEBU Take 2 mLs (15 mcg total) by nebulization 2 (two) times daily. Take 2 mLs (15 mcg total) by nebulization 2 (two) times daily. DX code 493.20 Every morning and 1800 07/11/16  Yes Flora Lipps, MD  aspirin EC 81 MG tablet Take 81 mg by mouth daily.   Yes [provider]  atorvastatin (LIPITOR) 80 MG tablet Take 1 tablet (80 mg total) by mouth daily. 05/09/16 03/12/18 Yes Wende Bushy, MD  benzonatate (TESSALON) 100 MG capsule Take 1 capsule (100 mg total) by mouth 3 (three) times daily as needed for cough. 04/22/17  Yes Demetrios Loll, MD  budesonide (PULMICORT) 0.5 MG/2ML nebulizer solution Take 2 mLs (0.5 mg total) by nebulization 2 (two) times daily. DX code  493.20 Every morning and 1800 07/11/16  Yes Kasa, Maretta Bees, MD  budesonide-formoterol (SYMBICORT) 160-4.5 MCG/ACT inhaler Inhale 2 puffs into the lungs 2 (two) times daily.   Yes [provider]  Cholecalciferol (VITAMIN D) 2000 UNITS tablet Take 2,000 Units by mouth daily.   Yes [provider]  furosemide (LASIX) 40 MG tablet Take 1 tablet (40 mg total) by mouth daily. Patient taking differently: Take 20-40 mg by mouth daily.  03/28/17  Yes Sudini, Alveta Heimlich, MD  ipratropium (ATROVENT) 0.06 % nasal spray Place 2 sprays into the nose 4 (four) times daily. 04/04/17 04/04/18 Yes Laverle Hobby, MD  levalbuterol (XOPENEX) 0.63 MG/3ML nebulizer solution Take 3 mLs (0.63 mg total) by nebulization every 4 (four) hours as needed for wheezing or shortness of breath. 03/28/17 03/28/18 Yes Sudini, Alveta Heimlich, MD  meclizine (ANTIVERT) 12.5 MG tablet Take 1-2 tablets by mouth 3 (three) times daily as needed. 01/05/17  Yes [provider]  metoprolol tartrate (LOPRESSOR) 25 MG tablet Take 0.5 tablets (12.5 mg total) by mouth 2 (two) times daily. 04/22/17  Yes Demetrios Loll, MD  nitroGLYCERIN (NITROSTAT) 0.4 MG SL tablet Place 1 tablet (0.4 mg total) under the tongue every 5 (five) minutes as needed for chest pain. 05/09/16  Yes Wende Bushy, MD  omeprazole (PRILOSEC) 40 MG capsule Take 40 mg by mouth daily.   Yes [provider]  PARoxetine (PAXIL) 30 MG tablet Take 30 mg by mouth daily.    Yes [provider]  Tiotropium Bromide Monohydrate (SPIRIVA RESPIMAT) 1.25 MCG/ACT AERS Inhale 2 puffs into the lungs 2 (two) times daily. 07/11/16  Yes Flora Lipps, MD  azithromycin (ZITHROMAX Z-PAK) 250 MG tablet Take 2 tablets (500 mg) on  Day 1,  followed by 1 tablet (250 mg) once daily on Days 2 through 5. 04/30/17   Darel Hong, MD  lisinopril (PRINIVIL,ZESTRIL) 5 MG tablet Take 1 tablet (5 mg total) by mouth daily. Patient not taking: Reported on 04/30/2017 04/22/17 04/22/18  Demetrios Loll, MD   predniSONE (DELTASONE) 50 MG tablet Take 1 tablet (50 mg total) by mouth daily for 4 days. 04/30/17 05/04/17  Darel Hong, MD      PHYSICAL EXAMINATION:   VITAL SIGNS: Blood pressure 114/63, pulse 72, resp. rate 16,  height 5\' 9"  (1.753 m), weight 91.2 kg (201 lb), SpO2 93 %.  GENERAL:  72 y.o.-year-old patient lying in the bed with no acute distress.  Frail-appearing EYES: Pupils equal, round, reactive to light and accommodation. No scleral icterus. Extraocular muscles intact.  HEENT: Head atraumatic, normocephalic. Oropharynx and nasopharynx clear.  NECK:  Supple, no jugular venous distention. No thyroid enlargement, no tenderness.  LUNGS: Severely diminished breath sounds throughout.  Mild use of accessory muscles of respiration.  CARDIOVASCULAR: S1, S2 normal. No murmurs, rubs, or gallops.  ABDOMEN: Soft, nontender, nondistended. Bowel sounds present. No organomegaly or mass.  EXTREMITIES: No pedal edema, cyanosis, or clubbing.  NEUROLOGIC: Cranial nerves II through XII are intact. MAES. Gait not checked.  PSYCHIATRIC: The patient is alert and oriented x 3.  SKIN: No obvious rash, lesion, or ulcer.   LABORATORY PANEL:   CBC Recent Labs  Lab 04/30/17 0309  WBC 14.3*  HGB 10.6*  HCT 33.0*  PLT 274  MCV 88.1  MCH 28.3  MCHC 32.1  RDW 16.0*  LYMPHSABS 1.0  MONOABS 0.9  EOSABS 0.2  BASOSABS 0.1   ------------------------------------------------------------------------------------------------------------------  Chemistries  Recent Labs  Lab 04/30/17 0309  NA 134*  K 4.0  CL 104  CO2 21*  GLUCOSE 149*  BUN 29*  CREATININE 1.23  CALCIUM 7.9*  AST 63*  ALT 48  ALKPHOS 121  BILITOT 0.5   ------------------------------------------------------------------------------------------------------------------ estimated creatinine clearance is 60.6 mL/min (by C-G formula based on SCr of 1.23  mg/dL). ------------------------------------------------------------------------------------------------------------------ No results for input(s): TSH, T4TOTAL, T3FREE, THYROIDAB in the last 72 hours.  Invalid input(s): FREET3   Coagulation profile No results for input(s): INR, PROTIME in the last 168 hours. ------------------------------------------------------------------------------------------------------------------- No results for input(s): DDIMER in the last 72 hours. -------------------------------------------------------------------------------------------------------------------  Cardiac Enzymes Recent Labs  Lab 04/30/17 0309  TROPONINI 0.03*   ------------------------------------------------------------------------------------------------------------------ Invalid input(s): POCBNP  ---------------------------------------------------------------------------------------------------------------  Urinalysis    Component Value Date/Time   COLORURINE YELLOW (A) 04/30/2017 0309   APPEARANCEUR TURBID (A) 04/30/2017 0309   APPEARANCEUR Hazy 05/11/2014 0047   LABSPEC 1.020 04/30/2017 0309   LABSPEC 1.015 05/11/2014 0047   PHURINE 6.0 04/30/2017 0309   GLUCOSEU NEGATIVE 04/30/2017 0309   GLUCOSEU 150 mg/dL 05/11/2014 0047   HGBUR LARGE (A) 04/30/2017 0309   BILIRUBINUR NEGATIVE 04/30/2017 0309   BILIRUBINUR Negative 05/11/2014 0047   KETONESUR NEGATIVE 04/30/2017 0309   PROTEINUR 100 (A) 04/30/2017 0309   NITRITE NEGATIVE 04/30/2017 0309   LEUKOCYTESUR MODERATE (A) 04/30/2017 0309   LEUKOCYTESUR 3+ 05/11/2014 0047     RADIOLOGY: Dg Chest Port 1 View  Result Date: 04/30/2017 CLINICAL DATA:  Respiratory distress EXAM: PORTABLE CHEST 1 VIEW COMPARISON:  04/18/2017 FINDINGS: Stable cardiomegaly with aortic atherosclerosis. The patient is status post CABG. Right atrial and right ventricular leads are stable appearance with left-sided pacemaker apparatus projecting over the  axilla. Interstitial edema is redemonstrated without acute pneumonic consolidation. No acute osseous abnormality. IMPRESSION: 1. Cardiomegaly with aortic atherosclerosis and post CABG change. 2. Redemonstration of mild interstitial edema. Electronically Signed   By: Ashley Royalty M.D.   On: 04/30/2017 03:56    EKG: Orders placed or performed during the hospital encounter of 04/30/17  . EKG 12-Lead  . EKG 12-Lead    IMPRESSION AND PLAN: 1 acute recurrent hypoxic hypercapnic respiratory failure Noted numerous frequent hospitalizations for similar presentation, last discharged less than 1 week ago Secondary to multifactorial process which includes acute on COPD exacerbation >> acute on chronic systolic congestive heart failure exacerbation  Admit to regular nursing for bed, supplemental oxygen with weaning as tolerated-on 2 L via nasal cannula continuous at home  2 acute on COPD exacerbation IV Solu-Medrol with tapering as tolerated, inhaled corticosteroids twice daily, scheduled breathing treatments, respiratory therapy to see, mucolytic agents, check sputum cultures, consult pulmonology given end-stage lung disease/frequent admissions, empiric azithromycin for 5-day course  3 acute on chronic systolic congestive heart failure exacerbation, mild EF35-40% noted on most recent echocardiogram IV Lasix twice daily, on Eliquis, continue aspirin, statin therapy, strict I&O monitoring, daily weights, Lopressor, lisinopril  4 chronic A. Fib Stable Continue Eliquis, Lopressor, amiodarone  5 chronic GERD without esophagitis Stable PPI daily  6 chronic hyperlipidemia, unspecified Stable continue statin therapy   Full code Condition stable Long-term prognosis is poor DVT prophylaxis-on Eliquis Disposition the home in 1-3 days barring any complications Consult palliative care consult given numerous frequent hospitalizations for similar presentation   All the records are reviewed and case  discussed with ED provider. Management plans discussed with the patient, family and they are in agreement.  CODE STATUS:full Code Status History    Date Active Date Inactive Code Status Order ID Comments User Context   04/17/2017 1610 04/22/2017 1754 Full Code [id number]  Dustin Flock, MD Inpatient   03/25/2017 0823 03/28/2017 1732 Full Code [id number]  Hillary Bow, MD ED   03/19/2017 0829 03/23/2017 1502 Full Code [id number]  Epifanio Lesches, MD ED   03/12/2017 1135 03/14/2017 1653 Full Code [id number]  Dustin Flock, MD ED   02/07/2017 1927 02/10/2017 0825 Full Code [id number]  Nicholes Mango, MD Inpatient   01/14/2016 1122 01/17/2016 1659 Full Code [id number]  [name], MD Inpatient   11/03/2014 1640 11/04/2014 1357 Full Code [id number]  [name], MD Inpatient   11/02/2014 1333 11/03/2014 1640 Full Code [id number]  [name], MD Inpatient   10/31/2013 1208 11/01/2013 0102 Full Code [id number]  [name], MD Inpatient       TOTAL TIME TAKING CARE OF THIS PATIENT: 45 minutes.    Avel Peace Martha Soltys M.D on 04/30/2017   Between 7am to 6pm - Pager - [phone]  After 6pm go to [email or web address] - password [email or web address] [facility]  Office  [phone]  CC: Primary care physician; Sofie Hartigan, MD   Note: This dictation was prepared with Dragon dictation along with smaller phrase technology. Any transcriptional errors that result from this process are unintentional.

## 2017-04-30 NOTE — Care Management (Addendum)
Patient apparently needs an outpatient sleep study per outpatient note. PCP Dr. Thereasa Distance with [facility] [phone].  I spoke with PCP office to arrange outpatient sleep study- appointment arranged for 05/21/17 at 345P.  Patient is also newly opened [facility] in outpatient.  He is on chronic O2 at home.  This is his six presentation in last 6 months. I have notified [facility] home [facility] of patient's admission.

## 2017-04-30 NOTE — Progress Notes (Signed)
* [facility]     Assessment and Plan:  AE COPD with acute bronchitis. Patient now admitted for some time to [facility] with recurrent COPD/CHF exacerbation. Patient remains at high risk of death and re-hospitalization due to multiple recurrent COPD exacerbations. -Continue current medications for COPD exacerbation. - We will consult discharge planning to see if we can set him up with a home BiPAP/trilogy ventilator for recurrent exacerbations. -Recommend the patient be discharged on maintenance prednisone 20 mg daily indefinitely, as well as Bactrim DS 1 tablet daily, in the hopes of reducing future exacerbation and hospitalization risk. - Discussed with patient that if he continues to experience recurrent exacerbations would consider palliative care consultation.   Date: 04/30/2017  MRN# [id number] TAG WURTZ 06-28-1945   AADON GORELIK is a 72 y.o. old male seen in follow up for chief complaint of  Chief Complaint  Patient presents with  . Respiratory Distress     HPI:  Patient is a 72 year old male with a history of severe COPD, thus far this is his seventh hospital admission at [facility] this year for various cardio/respiratory issues including COPD exacerbation, unstable angina, pulmonary edema with congestive heart failure. He underwent three-vessel CABG in 2019, discharged to rehab. At last visit in the office he was noted to be on numerous inhalers, with considerable confusion about what to use and when.  He was therefore taken off all of his inhalers, asked to use only nebulizers, which included Pulmicort, Brovana, Xopenex.  He has not been on azithromycin due to concomitant use of amiodarone. Patient feels that he is doing better.  He is not smoking, he has no pets.   Imaging personally reviewed, chest x-ray 04/30/17; bibasilar atelectasis, hyperplasia consistent with emphysema, bilateral interstitial changes consistent with mild pulmonary edema.    Medication:     Current Facility-Administered Medications:  .  0.9 %  sodium chloride infusion, 250 mL, Intravenous, PRN, Salary, Montell D, MD .  acetaminophen (TYLENOL) tablet 650 mg, 650 mg, Oral, Q6H PRN **OR** acetaminophen (TYLENOL) suppository 650 mg, 650 mg, Rectal, Q6H PRN, Salary, Montell D, MD .  ALPRAZolam Duanne Moron) tablet 0.25 mg, 0.25 mg, Oral, BID PRN, Salary, Montell D, MD .  amiodarone (PACERONE) tablet 200 mg, 200 mg, Oral, Daily, Salary, Montell D, MD, 200 mg at 04/30/17 1045 .  [START ON 05/01/2017] apixaban (ELIQUIS) tablet 5 mg, 5 mg, Oral, BID, Salary, Montell D, MD .  arformoterol (BROVANA) nebulizer solution 15 mcg, 15 mcg, Nebulization, BID, Salary, Montell D, MD .  aspirin EC tablet 81 mg, 81 mg, Oral, Daily, Salary, Montell D, MD, 81 mg at 04/30/17 1046 .  atorvastatin (LIPITOR) tablet 80 mg, 80 mg, Oral, Daily, Salary, Montell D, MD, 80 mg at 04/30/17 1046 .  benzonatate (TESSALON) capsule 100 mg, 100 mg, Oral, TID PRN, Salary, Montell D, MD .  bisacodyl (DULCOLAX) EC tablet 5 mg, 5 mg, Oral, Daily PRN, Salary, Montell D, MD .  budesonide (PULMICORT) nebulizer solution 0.5 mg, 0.5 mg, Nebulization, Q12H, Salary, Montell D, MD .  cholecalciferol (VITAMIN D) tablet 2,000 Units, 2,000 Units, Oral, Daily, Salary, Montell D, MD, 2,000 Units at 04/30/17 1044 .  docusate sodium (COLACE) capsule 100 mg, 100 mg, Oral, BID, Salary, Montell D, MD, 100 mg at 04/30/17 1046 .  furosemide (LASIX) injection 40 mg, 40 mg, Intravenous, BID, Salary, Montell D, MD, 40 mg at 04/30/17 1033 .  guaiFENesin (MUCINEX) 12 hr tablet 600 mg, 600 mg, Oral, BID, Salary, Montell D,  MD .  HYDROcodone-acetaminophen (NORCO/VICODIN) 5-325 MG per tablet 1-2 tablet, 1-2 tablet, Oral, Q4H PRN, Salary, Montell D, MD .  ipratropium (ATROVENT) 0.06 % nasal spray 2 spray, 2 spray, Nasal, QID, Salary, Montell D, MD, 2 spray at 04/30/17 1047 .  ipratropium (ATROVENT) nebulizer solution 0.5 mg, 0.5 mg, Nebulization, Q6H, Salary,  Montell D, MD .  levalbuterol (XOPENEX) nebulizer solution 0.63 mg, 0.63 mg, Nebulization, Q6H, Salary, Montell D, MD .  lisinopril (PRINIVIL,ZESTRIL) tablet 5 mg, 5 mg, Oral, Daily, Salary, Montell D, MD, 5 mg at 04/30/17 1046 .  meclizine (ANTIVERT) tablet 12.5-25 mg, 12.5-25 mg, Oral, TID PRN, Salary, Montell D, MD .  methylPREDNISolone sodium succinate (SOLU-MEDROL) 125 mg/2 mL injection 60 mg, 60 mg, Intravenous, Q6H, Salary, Montell D, MD, 60 mg at 04/30/17 1041 .  metoprolol tartrate (LOPRESSOR) tablet 12.5 mg, 12.5 mg, Oral, BID, Salary, Montell D, MD, 12.5 mg at 04/30/17 1046 .  nitroGLYCERIN (NITROSTAT) SL tablet 0.4 mg, 0.4 mg, Sublingual, Q5 min PRN, Salary, Montell D, MD .  ondansetron (ZOFRAN) tablet 4 mg, 4 mg, Oral, Q6H PRN **OR** ondansetron (ZOFRAN) injection 4 mg, 4 mg, Intravenous, Q6H PRN, Salary, Montell D, MD .  pantoprazole (PROTONIX) EC tablet 40 mg, 40 mg, Oral, Daily, Salary, Montell D, MD, 40 mg at 04/30/17 1047 .  PARoxetine (PAXIL) tablet 30 mg, 30 mg, Oral, Daily, Salary, Montell D, MD, 30 mg at 04/30/17 1047 .  senna-docusate (Senokot-S) tablet 1 tablet, 1 tablet, Oral, QHS PRN, Salary, Montell D, MD .  sodium chloride flush (NS) 0.9 % injection 3 mL, 3 mL, Intravenous, Q12H, Salary, Montell D, MD, 3 mL at 04/30/17 1033 .  sodium chloride flush (NS) 0.9 % injection 3 mL, 3 mL, Intravenous, PRN, Salary, Montell D, MD .  sodium phosphate (FLEET) 7-19 GM/118ML enema 1 enema, 1 enema, Rectal, Once PRN, Salary, Montell D, MD   Allergies:  Other  Review of Systems: Gen:  Denies  fever, sweats. HEENT: Denies blurred vision. Cvc:  No dizziness, chest pain or heaviness Resp:   Denies cough or sputum porduction. Gi: Denies swallowing difficulty, stomach pain. constipation, bowel incontinence Gu:  Denies bladder incontinence, burning urine Ext:   No Joint pain, stiffness. Skin: No skin rash, easy bruising. Endoc:  No polyuria, polydipsia. Psych: No depression,  insomnia. Other:  All other systems were reviewed and found to be negative other than what is mentioned in the HPI.   Physical Examination:   VS: BP 111/67 (BP Location: Left Arm)   Pulse 89   Temp 98.4 F (36.9 C) (Oral)   Resp 20   Ht 5\' 9"  (1.753 m)   Wt 201 lb (91.2 kg)   SpO2 98%   BMI 29.68 kg/m    General Appearance: No distress  Neuro:without focal findings,  speech normal,  HEENT: PERRLA, EOM intact. Pulmonary: normal breath sounds, scattered bilateral wheezing. CardiovascularNormal S1,S2.  No m/r/g.   Abdomen: Benign, Soft, non-tender. Renal:  No costovertebral tenderness  GU:  Not performed at this time. Endoc: No evident thyromegaly, no signs of acromegaly. Skin:   warm, no rash. Extremities: normal, no cyanosis, clubbing.   LABORATORY PANEL:   CBC Recent Labs  Lab 04/30/17 0309  WBC 14.3*  HGB 10.6*  HCT 33.0*  PLT 274   ------------------------------------------------------------------------------------------------------------------  Chemistries  Recent Labs  Lab 04/30/17 0309  NA 134*  K 4.0  CL 104  CO2 21*  GLUCOSE 149*  BUN 29*  CREATININE 1.23  CALCIUM 7.9*  AST  63*  ALT 48  ALKPHOS 121  BILITOT 0.5   ------------------------------------------------------------------------------------------------------------------  Cardiac Enzymes Recent Labs  Lab 04/30/17 0309  TROPONINI 0.03*   ------------------------------------------------------------  RADIOLOGY:   No results found for this or any previous visit. Results for orders placed during the hospital encounter of 04/27/16  DG Chest 2 View   Narrative CLINICAL DATA:  Centralized chest pain  EXAM: CHEST  2 VIEW  COMPARISON:  05/10/2014  FINDINGS: Lungs hyperexpanded. Interstitial markings are diffusely coarsened with chronic features. The cardiopericardial silhouette is within normal limits for size. Left permanent pacemaker again noted. The visualized bony structures  of the thorax are intact.  IMPRESSION: Emphysema without acute findings.   Electronically Signed   By: Misty Stanley M.D.   On: 04/27/2016 13:26    ------------------------------------------------------------------------------------------------------------------  Thank  you for allowing [facility] to assist in the care of your patient. Our recommendations are noted above.  Please contact us if we can be of further service.   Marda Stalker, MD.  [facility] Office Number: [phone]  Patricia Pesa, M.D.  Merton Border, M.D  04/30/2017

## 2017-04-30 NOTE — ED Notes (Signed)
Ostomy bag emptied at this time by this RN.

## 2017-04-30 NOTE — ED Triage Notes (Signed)
Pt arrives via [facility] for respiratory distress. Per EMS, the fire department was on scene first and reports a RA saturation of 93%. Pt reports hx of COPD and wears 2L chronically at home at this time. EMS reports 125 mg of solumedrol en route to facility with other VS WDL and CBG of 215. Pt is able to talk in complete sentences at this time with the use of Bi-Pap.

## 2017-04-30 NOTE — Discharge Instructions (Signed)
Please take all of your steroids and antibiotics as prescribed and make an appointment to follow-up with your primary care physician within 2 days for recheck.  Return to the emergency department sooner for any concerns.  It was a pleasure to take care of you today, and thank you for coming to our emergency department.  If you have any questions or concerns before leaving please ask the nurse to grab me and I'm more than happy to go through your aftercare instructions again.  If you were prescribed any opioid pain medication today such as Norco, Vicodin, Percocet, morphine, hydrocodone, or oxycodone please make sure you do not drive when you are taking this medication as it can alter your ability to drive safely.  If you have any concerns once you are home that you are not improving or are in fact getting worse before you can make it to your follow-up appointment, please do not hesitate to call [phone] and come back for further evaluation.  Darel Hong, MD  Results for orders placed or performed during the hospital encounter of 04/30/17  Comprehensive metabolic panel  Result Value Ref Range   Sodium 134 (L) 135 - 145 mmol/L   Potassium 4.0 3.5 - 5.1 mmol/L   Chloride 104 101 - 111 mmol/L   CO2 21 (L) 22 - 32 mmol/L   Glucose, Bld 149 (H) 65 - 99 mg/dL   BUN 29 (H) 6 - 20 mg/dL   Creatinine, Ser 1.23 0.61 - 1.24 mg/dL   Calcium 7.9 (L) 8.9 - 10.3 mg/dL   Total Protein 6.6 6.5 - 8.1 g/dL   Albumin 3.0 (L) 3.5 - 5.0 g/dL   AST 63 (H) 15 - 41 U/L   ALT 48 17 - 63 U/L   Alkaline Phosphatase 121 38 - 126 U/L   Total Bilirubin 0.5 0.3 - 1.2 mg/dL   GFR calc non Af Amer 57 (L) >60 mL/min   GFR calc Af Amer >60 >60 mL/min   Anion gap 9 5 - 15  Troponin I  Result Value Ref Range   Troponin I 0.03 (HH) <0.03 ng/mL  Brain natriuretic peptide  Result Value Ref Range   B Natriuretic Peptide 769.0 (H) 0.0 - 100.0 pg/mL  CBC with Differential  Result Value Ref Range   WBC 14.3 (H) 3.8 - 10.6  K/uL   RBC 3.74 (L) 4.40 - 5.90 MIL/uL   Hemoglobin 10.6 (L) 13.0 - 18.0 g/dL   HCT 33.0 (L) 40.0 - 52.0 %   MCV 88.1 80.0 - 100.0 fL   MCH 28.3 26.0 - 34.0 pg   MCHC 32.1 32.0 - 36.0 g/dL   RDW 16.0 (H) 11.5 - 14.5 %   Platelets 274 150 - 440 K/uL   Neutrophils Relative % 84 %   Neutro Abs 12.1 (H) 1.4 - 6.5 K/uL   Lymphocytes Relative 7 %   Lymphs Abs 1.0 1.0 - 3.6 K/uL   Monocytes Relative 7 %   Monocytes Absolute 0.9 0.2 - 1.0 K/uL   Eosinophils Relative 2 %   Eosinophils Absolute 0.2 0 - 0.7 K/uL   Basophils Relative 0 %   Basophils Absolute 0.1 0 - 0.1 K/uL  Blood gas, venous  Result Value Ref Range   FIO2 0.30    Delivery systems BILEVEL POSITIVE AIRWAY PRESSURE    pH, Ven 7.29 7.250 - 7.430   pCO2, Ven 49 44.0 - 60.0 mmHg   pO2, Ven 59.0 (H) 32.0 - 45.0 mmHg   Bicarbonate  23.6 20.0 - 28.0 mmol/L   Acid-base deficit 3.1 (H) 0.0 - 2.0 mmol/L   O2 Saturation 86.7 %   Patient temperature 37.0    Collection site LINE    Sample type VENOUS   Lactic acid, plasma  Result Value Ref Range   Lactic Acid, Venous 2.5 (HH) 0.5 - 1.9 mmol/L  Influenza panel by PCR (type A & B)  Result Value Ref Range   Influenza A By PCR NEGATIVE NEGATIVE   Influenza B By PCR NEGATIVE NEGATIVE  Urinalysis, Complete w Microscopic  Result Value Ref Range   Color, Urine YELLOW (A) YELLOW   APPearance TURBID (A) CLEAR   Specific Gravity, Urine 1.020 1.005 - 1.030   pH 6.0 5.0 - 8.0   Glucose, UA NEGATIVE NEGATIVE mg/dL   Hgb urine dipstick LARGE (A) NEGATIVE   Bilirubin Urine NEGATIVE NEGATIVE   Ketones, ur NEGATIVE NEGATIVE mg/dL   Protein, ur 100 (A) NEGATIVE mg/dL   Nitrite NEGATIVE NEGATIVE   Leukocytes, UA MODERATE (A) NEGATIVE   RBC / HPF TOO NUMEROUS TO COUNT 0 - 5 RBC/hpf   WBC, UA TOO NUMEROUS TO COUNT 0 - 5 WBC/hpf   Bacteria, UA NONE SEEN NONE SEEN   Squamous Epithelial / LPF 0-5 (A) NONE SEEN   WBC Clumps PRESENT    Mucus PRESENT    Dg Chest Port 1 View  Result Date:  04/30/2017 CLINICAL DATA:  Respiratory distress EXAM: PORTABLE CHEST 1 VIEW COMPARISON:  04/18/2017 FINDINGS: Stable cardiomegaly with aortic atherosclerosis. The patient is status post CABG. Right atrial and right ventricular leads are stable appearance with left-sided pacemaker apparatus projecting over the axilla. Interstitial edema is redemonstrated without acute pneumonic consolidation. No acute osseous abnormality. IMPRESSION: 1. Cardiomegaly with aortic atherosclerosis and post CABG change. 2. Redemonstration of mild interstitial edema. Electronically Signed   By: Ashley Royalty M.D.   On: 04/30/2017 03:56   Dg Chest Port 1 View  Result Date: 04/18/2017 CLINICAL DATA:  Congestive heart failure EXAM: PORTABLE CHEST 1 VIEW COMPARISON:  April 17, 2017 in March 25, 2017 FINDINGS: There is trace interstitial edema. There are small pleural effusions bilaterally with bibasilar atelectasis. There is patchy airspace consolidation in the right base. There is cardiomegaly with pulmonary venous hypertension. Pacemaker leads are attached the right atrium and right ventricle. Patient is status post coronary artery bypass grafting. No adenopathy. No bone lesions. IMPRESSION: Pulmonary vascular congestion with trace interstitial edema and small pleural effusions bilaterally. Patchy airspace consolidation right base raises question of superimposed pneumonia in this area. No adenopathy appreciable. Electronically Signed   By: Lowella Grip III M.D.   On: 04/18/2017 08:06   Dg Chest Portable 1 View  Result Date: 04/17/2017 CLINICAL DATA:  Respiratory distress Asthma, COPD, CAD, h/o MI (2015), h/o prostate cancer, a-flutter EXAM: PORTABLE CHEST 1 VIEW COMPARISON:  03/25/2017 FINDINGS: Status post median sternotomy and CABG. LEFT-sided transvenous pacemaker leads to the RIGHT atrium and RIGHT ventricle. Heart size is normal accounting for patient position. There are interstitial Kerley B-lines consistent with  interstitial edema. Mild confluent edema at the bases. IMPRESSION: Pulmonary edema. Electronically Signed   By: Nolon Nations M.D.   On: 04/17/2017 13:23

## 2017-04-30 NOTE — Consult Note (Signed)
Consultation Note Date: 04/30/2017   Patient Name: Donald Juarez  DOB: Feb 16, 1945  MRN: [id number]  Age / Sex: 72 y.o., male  PCP: Sofie Hartigan, MD Referring Physician: Gorden Harms, MD  Reason for Consultation: Establishing goals of care  HPI/Patient Profile: 72 y.o. male  admitted from home via EMS on 04/30/2017 with complaints of shortness of breath, chest tightness, dyspnea, and requiring bipap in route via EMS. He has a past medical history of angina, anxieyt, asthma, carotid stenosis, COPD, coronary artery disease, CHF, hypertension, MI (2015), a-flutter, prostate cancer, rectal cancer, and seizures. He was recently discharged from the hospital with similar presentation about 1 week ago and noted to have numerous frequent hospitalizations. During his ED course he was weaned off of bipap and placed on nasal cannula. His chest x-ray noted mild edema, BNP greater than 700, white count 14,000, and urinalysis suspicious for UTI-has chronic indwelling Foley catheter. He was admitted for acute on chronic hypoxic hypercapnic respiratory failure secondary to multifactorial process which includes acute on COPD exacerbation > acute on chronic systolic congestive heart failure. Palliative Medicine team consulted for goals of care discussion.   Clinical Assessment and Goals of Care: I have reviewed medical records including lab results, imaging, Epic notes, and MAR, received report from the bedside RN, and assessed the patient. I met at the bedside with patient to discuss diagnosis prognosis, GOC, EOL wishes, disposition and options. Patient is alert and oriented and is appropriate to engage in conversation. No family is presently at bedside and he verbalized he could relay any information to them if necessary.   I introduced Palliative Medicine as specialized medical care for people living with serious illness. It  focuses on providing relief from the symptoms and stress of a serious illness. The goal is to improve quality of life for both the patient and the family.  We discussed a brief life review of the patient. 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 His wife became ill and he retired to care for her full time, however over the past 2-3 years his health has declined due to COPD and cardiac issues and reports he had to place her in a facility. He has 2 sons, one lives in [place] and is in the [employer] and the other lives in [place] as a Engineer, structural. He reports noticing his respiratory has declined moreso over the past year requiring more frequent hospitalizations. He reports living on his own and able to care for himself although it is becoming more difficult.   We discussed his current illness and what it means in the larger context of his on-going co-morbidities.  Natural disease trajectory and expectations at EOL were discussed. He verbalized his understanding of is health condition and knows he will not live forever, however it is important for him to be here for his wife as long as he can.   I attempted to elicit values and goals of care important to the patient.  The difference between aggressive medical intervention and comfort care was considered in light of the patient's goals of care. At this time he verbalizes his wishes to continue to treat the treatable. He became tearful verbalizing he has been told that his current regimen of care regarding his COPD is at the max and what is being done now is all that Pulmonology could offer; which is oxygen support, antibiotics, and steroids. He verbalized he is ok with this and will go as long as he can, and until God see's different.   Advanced directives, concepts specific to code status, artifical feeding and hydration, and rehospitalization were considered and discussed. At this time Mr. Schroader is aware that if his  heart was to stop or he was in more severe respiratory distress that he may not come off of life-support due to the severity of his heart and lungs. He wishes to be a DNR/DNI.   Hospice and Palliative Care services outpatient were explained and offered. He is in agreement to Palliative services in the outpatient setting but does not feel that he is prepared to approach hospice (verbalizing he knows it is near however).   Questions and concerns were addressed. The family was encouraged to call with questions or concerns.  PMT will continue to support holistically.  Primary Decision Maker-Patient is able to make competent decisions, in the setting that he is not his sons would be his decision makers.     SUMMARY OF RECOMMENDATIONS    DNR/DNI-per patient's request. Verbalizes knowing the severity of his health conditions  Continue to treat the treatable per medical team per patient's request  Case Manager consult for outpatient Palliative services.   Palliative will continue to support patient, family, and medical team during hospitalization.   Code Status/Advance Care Planning:  DNR/DNI per patient request    Palliative Prophylaxis:   Bowel Regimen and Frequent Pain Assessment  Additional Recommendations (Limitations, Scope, Preferences):  Full Scope Treatment  Psycho-social/Spiritual:   Desire for further Chaplaincy support:no  Prognosis:   Unable to determine-Guarded to Poor in the setting of frequent hospitalizations, CHF, COPD, recurrent respiratory failure, hypoxia, hypercapnia, CAD  Discharge Planning: To Be Determined (Home versus facility with Palliative Services)      Primary Diagnoses: Present on Admission: . Respiratory failure with hypoxia and hypercapnia ([place])   I have reviewed the medical record, interviewed the patient and family, and examined the patient. The following aspects are pertinent.  Past Medical History:  Diagnosis Date  . Anginal pain  ([place])    feels this as jaw pain.  takes imdur for this. rarely uses nitro.  [name] Anxiety   . Arthritis   . Asthma   . Bilateral claudication of lower limb ([place])   . Carotid stenosis    a. 07/2013 Carotid U/S: 100% RICA (pt prev reported h/o 22%), LICA 02-54%, bilat ECA 50%, normal vertebrals and subclavians bilat-->F/U needed in 01/2014.  . Cataract of left eye   . Cataract, right    a. 10/2010 s/p cataract surgery   . COPD (chronic obstructive pulmonary disease) ([place])   . Coronary artery disease    a. 06/1996 & 03/2005 Cath: non-obs dzs;  b. 07/2009 NSTEMI/Cath: LCX 99->PCI/DES extending into OM1;  c. 01/2013 Cath: patent LCX stent-->Med Rx.; d. cath 03/2014: RPAV dzs->Med rx; e. 10/2014 Cath/PCI: LM 30, LAD 20p/m, LCX 50ost/p, 10 ISR, 60d, OM1 min irregs, OM2 50, RCA 30ost, 36m 60d, RPDA 70(2.5x8 Xience DES), RPAV 90small(PTCA); f. 10/2014 Relook Cath: RPAV 99(Med  Rx), EF 45-50.  [name] Dysrhythmia    since ablation, flutter resolved.  . Essential hypertension   . Hyperkalemia   . Hyperlipidemia   . Myocardial infarction ([place]) 2015   had stent inserted and then had heart attack after. stent inserted due to blockage  . Paroxysmal atrial flutter ([place])    a. CHA2DS2VASc = 3 -> eliquis;  b. 07/2013 Echo: EF 50-55%, mildly dil LA.c. s/p RFCA 10/31/2013 by Dr Caryl Comes  . Prostate cancer ([place])   . Pulmonary nodule, right    a. 0.7cm RLL - stable by CT 06/2013.  [name] Rectal cancer ([place])    a. Status post colostomy  . Seizures ([place])    a. last 30 years ago  . Shingles    a. 09/2012.  . SSS (sick sinus syndrome) ([place])    a. 12/2009 S/P MDT Adapta ADDR01 DC PPM, ser # [id number] ([name]).  . Syncope and collapse    Social History   Socioeconomic History  . Marital status: Married    Spouse name: Not on file  . Number of children: Not on file  . Years of education: Not on file  . Highest education level: Not on file  Occupational History  . Not on file  Social Needs  . Financial resource strain: Not  on file  . Food insecurity:    Worry: Not on file    Inability: Not on file  . Transportation needs:    Medical: Not on file    Non-medical: Not on file  Tobacco Use  . Smoking status: Former Smoker    Packs/day: 1.00    Years: 30.00    Pack years: 30.00    Types: Cigarettes    Last attempt to quit: 07/26/2008    Years since quitting: 8.7  . Smokeless tobacco: Never Used  Substance and Sexual Activity  . Alcohol use: No  . Drug use: No  . Sexual activity: Never  Lifestyle  . Physical activity:    Days per week: Not on file    Minutes per session: Not on file  . Stress: Not on file  Relationships  . Social connections:    Talks on phone: Not on file    Gets together: Not on file    Attends religious service: Not on file    Active member of club or organization: Not on file    Attends meetings of clubs or organizations: Not on file    Relationship status: Not on file  Other Topics Concern  . Not on file  Social History Narrative  . Not on file   Family History  Problem Relation Age of Onset  . Cancer Mother        'male cancer"  . Hypertension Mother   . Hyperlipidemia Mother   . Heart disease Father   . Heart attack Father   . Hypertension Father   . Hyperlipidemia Father    Scheduled Meds: . amiodarone  200 mg Oral Daily  . [START ON 05/01/2017] apixaban  5 mg Oral BID  . arformoterol  15 mcg Nebulization BID  . aspirin EC  81 mg Oral Daily  . atorvastatin  80 mg Oral Daily  . budesonide  0.5 mg Nebulization Q12H  . cholecalciferol  2,000 Units Oral Daily  . docusate sodium  100 mg Oral BID  . furosemide  40 mg Intravenous BID  . guaiFENesin  600 mg Oral BID  . ipratropium  2 spray Nasal QID  . ipratropium  0.5  mg Nebulization Q6H  . levalbuterol  0.63 mg Nebulization Q6H  . [START ON 05/01/2017] lisinopril  2.5 mg Oral Daily  . methylPREDNISolone (SOLU-MEDROL) injection  60 mg Intravenous Q6H  . metoprolol tartrate  12.5 mg Oral BID  . pantoprazole  40  mg Oral Daily  . PARoxetine  30 mg Oral Daily  . [START ON 05/01/2017] protein supplement shake  11 oz Oral Q24H  . sodium chloride flush  3 mL Intravenous Q12H   Continuous Infusions: . sodium chloride    . ceFEPime (MAXIPIME) IV     PRN Meds:.sodium chloride, acetaminophen **OR** acetaminophen, ALPRAZolam, benzonatate, bisacodyl, HYDROcodone-acetaminophen, meclizine, metoprolol tartrate, nitroGLYCERIN, ondansetron **OR** ondansetron (ZOFRAN) IV, senna-docusate, sodium chloride flush, sodium phosphate Medications Prior to Admission:  Prior to Admission medications   Medication Sig Start Date End Date Taking? Authorizing Provider  ALPRAZolam Duanne Moron) 0.25 MG tablet Take 1 tablet by mouth 2 (two) times daily as needed. 04/04/17  Yes [provider]  amiodarone (PACERONE) 200 MG tablet Take 200 mg by mouth daily.   Yes [provider]  apixaban (ELIQUIS) 5 MG TABS tablet Take 5 mg by mouth 2 (two) times daily. 02/26/17  Yes [provider]  arformoterol (BROVANA) 15 MCG/2ML NEBU Take 2 mLs (15 mcg total) by nebulization 2 (two) times daily. Take 2 mLs (15 mcg total) by nebulization 2 (two) times daily. DX code 493.20 Every morning and 1800 07/11/16  Yes Flora Lipps, MD  aspirin EC 81 MG tablet Take 81 mg by mouth daily.   Yes [provider]  atorvastatin (LIPITOR) 80 MG tablet Take 1 tablet (80 mg total) by mouth daily. 05/09/16 03/12/18 Yes Wende Bushy, MD  benzonatate (TESSALON) 100 MG capsule Take 1 capsule (100 mg total) by mouth 3 (three) times daily as needed for cough. 04/22/17  Yes Demetrios Loll, MD  budesonide (PULMICORT) 0.5 MG/2ML nebulizer solution Take 2 mLs (0.5 mg total) by nebulization 2 (two) times daily. DX code 493.20 Every morning and 1800 07/11/16  Yes Kasa, Maretta Bees, MD  budesonide-formoterol (SYMBICORT) 160-4.5 MCG/ACT inhaler Inhale 2 puffs into the lungs 2 (two) times daily.   Yes [provider]  Cholecalciferol (VITAMIN D) 2000 UNITS  tablet Take 2,000 Units by mouth daily.   Yes [provider]  furosemide (LASIX) 40 MG tablet Take 1 tablet (40 mg total) by mouth daily. Patient taking differently: Take 20-40 mg by mouth daily.  03/28/17  Yes Sudini, Alveta Heimlich, MD  ipratropium (ATROVENT) 0.06 % nasal spray Place 2 sprays into the nose 4 (four) times daily. 04/04/17 04/04/18 Yes Laverle Hobby, MD  levalbuterol (XOPENEX) 0.63 MG/3ML nebulizer solution Take 3 mLs (0.63 mg total) by nebulization every 4 (four) hours as needed for wheezing or shortness of breath. 03/28/17 03/28/18 Yes Sudini, Alveta Heimlich, MD  meclizine (ANTIVERT) 12.5 MG tablet Take 1-2 tablets by mouth 3 (three) times daily as needed. 01/05/17  Yes [provider]  metoprolol tartrate (LOPRESSOR) 25 MG tablet Take 0.5 tablets (12.5 mg total) by mouth 2 (two) times daily. 04/22/17  Yes Demetrios Loll, MD  nitroGLYCERIN (NITROSTAT) 0.4 MG SL tablet Place 1 tablet (0.4 mg total) under the tongue every 5 (five) minutes as needed for chest pain. 05/09/16  Yes Wende Bushy, MD  omeprazole (PRILOSEC) 40 MG capsule Take 40 mg by mouth daily.   Yes [provider]  PARoxetine (PAXIL) 30 MG tablet Take 30 mg by mouth daily.    Yes [provider]  Tiotropium Bromide Monohydrate (SPIRIVA RESPIMAT)  1.25 MCG/ACT AERS Inhale 2 puffs into the lungs 2 (two) times daily. 07/11/16  Yes Flora Lipps, MD  azithromycin (ZITHROMAX Z-PAK) 250 MG tablet Take 2 tablets (500 mg) on  Day 1,  followed by 1 tablet (250 mg) once daily on Days 2 through 5. 04/30/17   Darel Hong, MD  lisinopril (PRINIVIL,ZESTRIL) 5 MG tablet Take 1 tablet (5 mg total) by mouth daily. Patient not taking: Reported on 04/30/2017 04/22/17 04/22/18  Demetrios Loll, MD  predniSONE (DELTASONE) 50 MG tablet Take 1 tablet (50 mg total) by mouth daily for 4 days. 04/30/17 05/04/17  Darel Hong, MD   Allergies  Allergen Reactions  . Other     Sleep medications cause hallucinations, agitation per patient's  son. Did not know which one.   Review of Systems  Constitutional: Positive for fatigue.  HENT: Positive for congestion.   Respiratory: Positive for cough, shortness of breath and wheezing.     Physical Exam  Constitutional: He is oriented to person, place, and time. He appears well-developed. He is cooperative.  Cardiovascular: S1 normal, S2 normal and intact distal pulses. An irregular rhythm present.  Pulmonary/Chest: He has decreased breath sounds.  Shortness of breath, 2L/[place]   Musculoskeletal:  Generalized Weakness   Neurological: He is alert and oriented to person, place, and time.  Nursing note and vitals reviewed.   Vital Signs: BP 109/78 (BP Location: Left Arm) Comment: Post metoprolol IV.  Pulse (!) 114   Temp 98.4 F (36.9 C) (Oral)   Resp (!) 22   Ht _0  (1.753 m)   Wt 91.2 kg (201 lb)   SpO2 98%   BMI 29.68 kg/m  Pain Scale: 0-10   Pain Score: 0-No pain   SpO2: SpO2: 98 % O2 Device:SpO2: 98 % O2 Flow Rate: .O2 Flow Rate (L/min): 2 L/min  IO: Intake/output summary:   Intake/Output Summary (Last 24 hours) at 04/30/2017 1630 Last data filed at 04/30/2017 1543 Gross per 24 hour  Intake 240 ml  Output 2650 ml  Net -2410 ml    LBM: Last BM Date: 04/30/17 Baseline Weight: Weight: 91.2 kg (201 lb) Most recent weight: Weight: 91.2 kg (201 lb)     Palliative Assessment/Data:PPS 50%    Time In: 1445 Time Out: 1600 Time Total: 75 min  Greater than 50%  of this time was spent counseling and coordinating care related to the above assessment and plan.  Signed by:  Alda Lea, NP-BC Ihor Dow, NP-BC  Palliative Medicine Team  Phone: [phone] Fax: [phone]   Please contact Palliative Medicine Team phone at [phone] for questions and concerns.  For individual provider: See Shea Evans

## 2017-04-30 NOTE — ED Provider Notes (Signed)
[facility] Emergency Department Provider Note  ____________________________________________   First MD Initiated Contact with Patient 04/30/17 0259     (approximate)  I have reviewed the triage vital signs and the nursing notes.   HISTORY  Chief Complaint Respiratory Distress   HPI Donald Juarez is a 72 y.o. male who comes to the emergency department via EMS with shortness of breath and respiratory distress.  Patient is a past medical history of CHF and COPD and says that for the past hour or so he has become acutely more short of breath.  He denies fevers or chills.  He does have moderate severity throbbing aching diffuse upper chest pain nonradiating.  He was placed on CPAP prehospital which improved his symptoms.  Was given 125 mg of Solu-Medrol prehospital as well.  No beta agonists in route.  Past Medical History:  Diagnosis Date  . Anginal pain ([place])    feels this as jaw pain.  takes imdur for this. rarely uses nitro.  [name] Anxiety   . Arthritis   . Asthma   . Bilateral claudication of lower limb ([place])   . Carotid stenosis    a. 07/2013 Carotid U/S: 100% RICA (pt prev reported h/o 73%), LICA 71-06%, bilat ECA 50%, normal vertebrals and subclavians bilat-->F/U needed in 01/2014.  . Cataract of left eye   . Cataract, right    a. 10/2010 s/p cataract surgery   . COPD (chronic obstructive pulmonary disease) ([place])   . Coronary artery disease    a. 06/1996 & 03/2005 Cath: non-obs dzs;  b. 07/2009 NSTEMI/Cath: LCX 99->PCI/DES extending into OM1;  c. 01/2013 Cath: patent LCX stent-->Med Rx.; d. cath 03/2014: RPAV dzs->Med rx; e. 10/2014 Cath/PCI: LM 30, LAD 20p/m, LCX 50ost/p, 10 ISR, 60d, OM1 min irregs, OM2 50, RCA 30ost, 42m, 60d, RPDA 70(2.5x8 Xience DES), RPAV 90small(PTCA); f. 10/2014 Relook Cath: RPAV 99(Med Rx), EF 45-50.  [name] Dysrhythmia    since ablation, flutter resolved.  . Essential hypertension   . Hyperkalemia   . Hyperlipidemia   . Myocardial  infarction ([place]) 2015   had stent inserted and then had heart attack after. stent inserted due to blockage  . Paroxysmal atrial flutter ([place])    a. CHA2DS2VASc = 3 -> eliquis;  b. 07/2013 Echo: EF 50-55%, mildly dil LA.c. s/p RFCA 10/31/2013 by Dr Caryl Comes  . Prostate cancer ([place])   . Pulmonary nodule, right    a. 0.7cm RLL - stable by CT 06/2013.  [name] Rectal cancer ([place])    a. Status post colostomy  . Seizures ([place])    a. last 30 years ago  . Shingles    a. 09/2012.  . SSS (sick sinus syndrome) ([place])    a. 12/2009 S/P MDT Adapta ADDR01 DC PPM, ser # [id number] ([name]).  . Syncope and collapse     Patient Active Problem List   Diagnosis Date Noted  . Respiratory failure with hypoxia and hypercapnia ([place]) 04/30/2017  . COPD exacerbation ([place]) 03/25/2017  . Acute respiratory failure ([place]) 03/22/2017  . Acute respiratory failure with hypoxia ([place]) 03/19/2017  . Acute CHF (congestive heart failure) ([place]) 03/12/2017  . Primary localized osteoarthritis of right knee 01/14/2016  . Essential hypertension   . Hyperlipidemia   . SSS (sick sinus syndrome) ([place])   . Unstable angina ([place]) 11/03/2014  . NSTEMI (non-ST elevated myocardial infarction) ([place])   . Effort angina ([place]) 11/02/2014  . S/P PTCA (percutaneous transluminal coronary angioplasty) 11/02/2014  . Coronary artery  disease involving native coronary artery   . Prostate cancer ([place])   . Anxiety and depression 06/18/2014  . Fatigue 11/14/2013  . Typical atrial flutter ([place]) 10/31/2013  . Bilateral claudication of lower limb ([place])   . Coronary artery disease   . Paroxysmal atrial flutter ([place])   . Hypertension   . Carotid stenosis   . COPD, severe GOLD GRADE D 03/28/2013  . Solitary pulmonary nodule 03/28/2013    Past Surgical History:  Procedure Laterality Date  . ABLATION  10/31/2013   CTI ablation by Dr Caryl Comes for atrial flutter  . ATRIAL FLUTTER ABLATION N/A 10/31/2013   Procedure: ATRIAL FLUTTER ABLATION;  Surgeon: [name], MD;  Location: [facility] CATH LAB;  Service: Cardiovascular;  Laterality: N/A;  . CARDIAC CATHETERIZATION  07/2009   [facility]  . CARDIAC CATHETERIZATION  01/2013   [facility]  . CARDIAC CATHETERIZATION  03/2014   [facility]  . CARDIAC CATHETERIZATION N/A 11/02/2014   Procedure: Left Heart Cath and Cors/Grafts Angiography;  Surgeon: [name], MD;  Location: [place] CV LAB;  Service: Cardiovascular;  Laterality: N/A;  . CARDIAC CATHETERIZATION N/A 11/02/2014   Procedure: Coronary Stent Intervention;  Surgeon: [name], MD;  Location: [place] CV LAB;  Service: Cardiovascular;  Laterality: N/A;  . CARDIAC CATHETERIZATION N/A 11/03/2014   Procedure: Left Heart Cath and Coronary Angiography;  Surgeon: [name], MD;  Location: [place] CV LAB;  Service: Cardiovascular;  Laterality: N/A;  . CARDIAC SURGERY  2011   Stents placed   . CATARACT EXTRACTION Left   . CATARACT EXTRACTION W/PHACO Left 06/01/2014   Procedure: CATARACT EXTRACTION PHACO AND INTRAOCULAR LENS PLACEMENT (IOC);  Surgeon: [name], MD;  Location: [name] ORS;  Service: Ophthalmology;  Laterality: Left;  . cataract surgery  2012  . colon surgery     cancer removal  . COLON SURGERY     colostomy  . COLONOSCOPY WITH PROPOFOL N/A 05/24/2016   Procedure: COLONOSCOPY WITH PROPOFOL;  Surgeon: [name], MD;  Location: [facility] ENDOSCOPY;  Service: Endoscopy;  Laterality: N/A;  . CORONARY ANGIOPLASTY    . INSERT / REPLACE / REMOVE PACEMAKER  2011   just a pacer  . KNEE ARTHROSCOPY Right 1980's  . LEFT HEART CATH AND CORONARY ANGIOGRAPHY N/A 02/08/2017   Procedure: LEFT HEART CATH AND CORONARY ANGIOGRAPHY;  Surgeon: [name], MD;  Location: [place] CV LAB;  Service: Cardiovascular;  Laterality: N/A;  . PACEMAKER PLACEMENT  01/17/2010  . TOTAL KNEE ARTHROPLASTY Right 01/14/2016   Procedure: TOTAL KNEE ARTHROPLASTY;  Surgeon: [name], MD;  Location: [name] ORS;  Service: Orthopedics;   Laterality: Right;    Prior to Admission medications   Medication Sig Start Date End Date Taking? Authorizing Provider  ALPRAZolam Duanne Moron) 0.25 MG tablet Take 1 tablet by mouth 2 (two) times daily as needed. 04/04/17  Yes [provider]  amiodarone (PACERONE) 200 MG tablet Take 200 mg by mouth daily.   Yes [provider]  apixaban (ELIQUIS) 5 MG TABS tablet Take 5 mg by mouth 2 (two) times daily. 02/26/17  Yes [provider]  arformoterol (BROVANA) 15 MCG/2ML NEBU Take 2 mLs (15 mcg total) by nebulization 2 (two) times daily. Take 2 mLs (15 mcg total) by nebulization 2 (two) times daily. DX code 493.20 Every morning and 1800 07/11/16  Yes Flora Lipps, MD  aspirin EC 81 MG tablet Take 81 mg by mouth daily.   Yes [provider]  atorvastatin (  LIPITOR) 80 MG tablet Take 1 tablet (80 mg total) by mouth daily. 05/09/16 03/12/18 Yes Wende Bushy, MD  benzonatate (TESSALON) 100 MG capsule Take 1 capsule (100 mg total) by mouth 3 (three) times daily as needed for cough. 04/22/17  Yes Demetrios Loll, MD  budesonide (PULMICORT) 0.5 MG/2ML nebulizer solution Take 2 mLs (0.5 mg total) by nebulization 2 (two) times daily. DX code 493.20 Every morning and 1800 07/11/16  Yes Kasa, Maretta Bees, MD  budesonide-formoterol (SYMBICORT) 160-4.5 MCG/ACT inhaler Inhale 2 puffs into the lungs 2 (two) times daily.   Yes [provider]  Cholecalciferol (VITAMIN D) 2000 UNITS tablet Take 2,000 Units by mouth daily.   Yes [provider]  furosemide (LASIX) 40 MG tablet Take 1 tablet (40 mg total) by mouth daily. Patient taking differently: Take 20-40 mg by mouth daily.  03/28/17  Yes Sudini, Alveta Heimlich, MD  ipratropium (ATROVENT) 0.06 % nasal spray Place 2 sprays into the nose 4 (four) times daily. 04/04/17 04/04/18 Yes Laverle Hobby, MD  levalbuterol (XOPENEX) 0.63 MG/3ML nebulizer solution Take 3 mLs (0.63 mg total) by nebulization every 4 (four) hours as needed for wheezing or  shortness of breath. 03/28/17 03/28/18 Yes Sudini, Alveta Heimlich, MD  meclizine (ANTIVERT) 12.5 MG tablet Take 1-2 tablets by mouth 3 (three) times daily as needed. 01/05/17  Yes [provider]  metoprolol tartrate (LOPRESSOR) 25 MG tablet Take 0.5 tablets (12.5 mg total) by mouth 2 (two) times daily. 04/22/17  Yes Demetrios Loll, MD  nitroGLYCERIN (NITROSTAT) 0.4 MG SL tablet Place 1 tablet (0.4 mg total) under the tongue every 5 (five) minutes as needed for chest pain. 05/09/16  Yes Wende Bushy, MD  omeprazole (PRILOSEC) 40 MG capsule Take 40 mg by mouth daily.   Yes [provider]  PARoxetine (PAXIL) 30 MG tablet Take 30 mg by mouth daily.    Yes [provider]  Tiotropium Bromide Monohydrate (SPIRIVA RESPIMAT) 1.25 MCG/ACT AERS Inhale 2 puffs into the lungs 2 (two) times daily. 07/11/16  Yes Flora Lipps, MD  azithromycin (ZITHROMAX Z-PAK) 250 MG tablet Take 2 tablets (500 mg) on  Day 1,  followed by 1 tablet (250 mg) once daily on Days 2 through 5. 04/30/17   Darel Hong, MD  lisinopril (PRINIVIL,ZESTRIL) 5 MG tablet Take 1 tablet (5 mg total) by mouth daily. Patient not taking: Reported on 04/30/2017 04/22/17 04/22/18  Demetrios Loll, MD  predniSONE (DELTASONE) 50 MG tablet Take 1 tablet (50 mg total) by mouth daily for 4 days. 04/30/17 05/04/17  Darel Hong, MD    Allergies Other  Family History  Problem Relation Age of Onset  . Cancer Mother        'male cancer"  . Hypertension Mother   . Hyperlipidemia Mother   . Heart disease Father   . Heart attack Father   . Hypertension Father   . Hyperlipidemia Father     Social History Social History   Tobacco Use  . Smoking status: Former Smoker    Packs/day: 1.00    Years: 30.00    Pack years: 30.00    Types: Cigarettes    Last attempt to quit: 07/26/2008    Years since quitting: 8.7  . Smokeless tobacco: Never Used  Substance Use Topics  . Alcohol use: No  . Drug use: No    Review of Systems Constitutional: No  fever/chills Eyes: No visual changes. ENT: No sore throat. Cardiovascular: Positive for chest pain. Respiratory: Positive for shortness of breath. Gastrointestinal: No abdominal  pain.  No nausea, no vomiting.  No diarrhea.  No constipation. Genitourinary: Negative for dysuria. Musculoskeletal: Negative for back pain. Skin: Negative for rash. Neurological: Negative for headaches, focal weakness or numbness.   ____________________________________________   PHYSICAL EXAM:  VITAL SIGNS: ED Triage Vitals  Enc Vitals Group     BP      Pulse      Resp      Temp      Temp src      SpO2      Weight      Height      Head Circumference      Peak Flow      Pain Score      Pain Loc      Pain Edu?      Excl. in [place]?     Constitutional: On BiPAP appears critically ill elevated respiratory rate Eyes: PERRL EOMI. Head: Atraumatic. Nose: No congestion/rhinnorhea. Mouth/Throat: No trismus Neck: No stridor.   Cardiovascular: Tachycardic rate, regular rhythm. Grossly normal heart sounds.  Good peripheral circulation. Respiratory: Severe respiratory distress using accessory muscles with wheezes in all fields Gastrointestinal: Soft nontender Musculoskeletal: No lower extremity edema legs are equal in size Neurologic:  Normal speech and language. No gross focal neurologic deficits are appreciated. Skin:  Skin is warm, dry and intact. No rash noted. Psychiatric: Anxious appearing   ____________________________________________   DIFFERENTIAL includes but not limited to  COPD exacerbation, pneumothorax, pulmonary embolism, flash pulmonary edema ____________________________________________   LABS (all labs ordered are listed, but only abnormal results are displayed)  Labs Reviewed  COMPREHENSIVE METABOLIC PANEL - Abnormal; Notable for the following components:      Result Value   Sodium 134 (*)    CO2 21 (*)    Glucose, Bld 149 (*)    BUN 29 (*)    Calcium 7.9 (*)    Albumin  3.0 (*)    AST 63 (*)    GFR calc non Af Amer 57 (*)    All other components within normal limits  TROPONIN I - Abnormal; Notable for the following components:   Troponin I 0.03 (*)    All other components within normal limits  BRAIN NATRIURETIC PEPTIDE - Abnormal; Notable for the following components:   B Natriuretic Peptide 769.0 (*)    All other components within normal limits  CBC WITH DIFFERENTIAL/PLATELET - Abnormal; Notable for the following components:   WBC 14.3 (*)    RBC 3.74 (*)    Hemoglobin 10.6 (*)    HCT 33.0 (*)    RDW 16.0 (*)    Neutro Abs 12.1 (*)    All other components within normal limits  BLOOD GAS, VENOUS - Abnormal; Notable for the following components:   pO2, Ven 59.0 (*)    Acid-base deficit 3.1 (*)    All other components within normal limits  LACTIC ACID, PLASMA - Abnormal; Notable for the following components:   Lactic Acid, Venous 2.5 (*)    All other components within normal limits  URINALYSIS, COMPLETE (UACMP) WITH MICROSCOPIC - Abnormal; Notable for the following components:   Color, Urine YELLOW (*)    APPearance TURBID (*)    Hgb urine dipstick LARGE (*)    Protein, ur 100 (*)    Leukocytes, UA MODERATE (*)    Squamous Epithelial / LPF 0-5 (*)    All other components within normal limits  CULTURE, BLOOD (ROUTINE X 2)  CULTURE, BLOOD (ROUTINE X 2)  URINE  CULTURE  LACTIC ACID, PLASMA  INFLUENZA PANEL BY PCR (TYPE A & B)  PREALBUMIN    Lab work reviewed by me with a number of abnormalities most notably an elevated lactic acid concerning for under resuscitation __________________________________________  EKG  ED ECG REPORT I, Darel Hong, the attending physician, personally viewed and interpreted this ECG.  Date: 04/30/2017 EKG Time:  Rate: 75 Rhythm:  ventricular paced rhythm QRS Axis: Leftward axis Intervals: normal ST/T Wave abnormalities: normal Narrative Interpretation: no evidence of acute  ischemia  ____________________________________________  RADIOLOGY  Chest x-ray reviewed by me consistent with mild pulmonary edema ____________________________________________   PROCEDURES  Procedure(s) performed: no  .Critical Care Performed by: Darel Hong, MD Authorized by: Darel Hong, MD   Critical care provider statement:    Critical care time (minutes):  30   Critical care time was exclusive of:  Separately billable procedures and treating other patients   Critical care was necessary to treat or prevent imminent or life-threatening deterioration of the following conditions:  Respiratory failure   Critical care was time spent personally by me on the following activities:  Development of treatment plan with patient or surrogate, discussions with consultants, evaluation of patient's response to treatment, examination of patient, obtaining history from patient or surrogate, ordering and performing treatments and interventions, ordering and review of laboratory studies, ordering and review of radiographic studies, pulse oximetry, re-evaluation of patient's condition and review of old charts    Critical Care performed: yes  Observation: no ____________________________________________   INITIAL IMPRESSION / ASSESSMENT AND PLAN / ED COURSE  Pertinent labs & imaging results that were available during my care of the patient were reviewed by me and considered in my medical decision making (see chart for details).  The patient arrives critically ill-appearing on BiPAP with extremely wheezy lungs.  He has a history of both CHF and COPD.  He has no leg swelling and I think he most likely is having more of a COPD exacerbation at this time.  Duo nebs, Solu-Medrol, magnesium are pending.    ----------------------------------------- 3:55 AM on 04/30/2017 -----------------------------------------  The patient's lungs are significantly improved and I took him off BiPAP and  placed him on 2 L.  He declines further albuterol at this time.  He is saturating 99-100%.  He is requesting to go home.   The patient attempted to get up and ambulate to go home and he became extremely short of breath and more wheezy.  Taken back to bed and his lungs are more wheezy with elevated respiratory rate.  Given 3 more breathing treatments but at this time he does require inpatient admission for further steroids and beta agonist.  The patient verbalizes understanding and agreement the plan.  I then discussed with the hospitalist who has graciously agreed to admit the patient to his service. ____________________________________________     FINAL CLINICAL IMPRESSION(S) / ED DIAGNOSES  Final diagnoses:  COPD exacerbation ([place])  Acute respiratory distress      NEW MEDICATIONS STARTED DURING THIS VISIT:  New Prescriptions   AZITHROMYCIN (ZITHROMAX Z-PAK) 250 MG TABLET    Take 2 tablets (500 mg) on  Day 1,  followed by 1 tablet (250 mg) once daily on Days 2 through 5.   PREDNISONE (DELTASONE) 50 MG TABLET    Take 1 tablet (50 mg total) by mouth daily for 4 days.     Note:  This document was prepared using Dragon voice recognition software and may include unintentional dictation errors.  Darel Hong, MD 04/30/17 [phone]

## 2017-05-01 ENCOUNTER — Ambulatory Visit: Payer: Medicare Other | Admitting: Urology

## 2017-05-01 DIAGNOSIS — J9622 Acute and chronic respiratory failure with hypercapnia: Secondary | ICD-10-CM

## 2017-05-01 DIAGNOSIS — Z7189 Other specified counseling: Secondary | ICD-10-CM | POA: Diagnosis not present

## 2017-05-01 DIAGNOSIS — J9621 Acute and chronic respiratory failure with hypoxia: Secondary | ICD-10-CM

## 2017-05-01 DIAGNOSIS — J441 Chronic obstructive pulmonary disease with (acute) exacerbation: Secondary | ICD-10-CM | POA: Diagnosis not present

## 2017-05-01 DIAGNOSIS — R0603 Acute respiratory distress: Secondary | ICD-10-CM | POA: Diagnosis not present

## 2017-05-01 DIAGNOSIS — Z515 Encounter for palliative care: Secondary | ICD-10-CM | POA: Diagnosis not present

## 2017-05-01 LAB — EXPECTORATED SPUTUM ASSESSMENT W GRAM STAIN, RFLX TO RESP C

## 2017-05-01 LAB — BASIC METABOLIC PANEL
Anion gap: 7 (ref 5–15)
BUN: 38 mg/dL — AB (ref 6–20)
CO2: 28 mmol/L (ref 22–32)
Calcium: 8.5 mg/dL — ABNORMAL LOW (ref 8.9–10.3)
Chloride: 106 mmol/L (ref 101–111)
Creatinine, Ser: 1.12 mg/dL (ref 0.61–1.24)
GFR calc Af Amer: >60 mL/min (ref >60)
GLUCOSE: 176 mg/dL — AB (ref 65–99)
POTASSIUM: 4 mmol/L (ref 3.5–5.1)
Sodium: 141 mmol/L (ref 135–145)

## 2017-05-01 MED ORDER — FUROSEMIDE 40 MG PO TABS: 40.0000 mg | ORAL_TABLET | Freq: Two times a day (BID) | ORAL | 0 refills | Status: AC

## 2017-05-01 MED ORDER — SULFAMETHOXAZOLE-TRIMETHOPRIM 800-160 MG PO TABS: 1.0000 | ORAL_TABLET | Freq: Every day | ORAL | 4 refills | Status: AC

## 2017-05-01 MED ORDER — CEFDINIR 300 MG PO CAPS: 300.0000 mg | ORAL_CAPSULE | Freq: Two times a day (BID) | ORAL | 0 refills | Status: DC

## 2017-05-01 MED ORDER — PREDNISONE 20 MG PO TABS: 20.0000 mg | ORAL_TABLET | Freq: Every day | ORAL | 3 refills | Status: AC

## 2017-05-01 MED ORDER — LISINOPRIL 2.5 MG PO TABS: 2.5000 mg | ORAL_TABLET | Freq: Every day | ORAL | 0 refills | Status: AC

## 2017-05-01 MED ORDER — PREMIER PROTEIN SHAKE: 11.0000 [oz_av] | ORAL | 3 refills | Status: AC

## 2017-05-01 MED ORDER — AMPICILLIN 500 MG PO CAPS: 500.0000 mg | ORAL_CAPSULE | Freq: Four times a day (QID) | ORAL | 0 refills | Status: DC

## 2017-05-01 NOTE — Plan of Care (Signed)
  Problem: Education: Goal: Knowledge of General Education information will improve Outcome: Progressing   Problem: Clinical Measurements: Goal: Ability to maintain clinical measurements within normal limits will improve Outcome: Progressing   Problem: Activity: Goal: Risk for activity intolerance will decrease Outcome: Progressing   Problem: Safety: Goal: Ability to remain free from injury will improve Outcome: Progressing   Problem: Respiratory: Goal: Levels of oxygenation will improve Outcome: Progressing Goal: Ability to maintain adequate ventilation will improve Outcome: Progressing

## 2017-05-01 NOTE — NC FL2 (Addendum)
[place] LEVEL OF CARE SCREENING TOOL     IDENTIFICATION  Patient Name: Donald Juarez Birthdate: [birth year] Sex: male Admission Date (Current Location): 04/30/2017  [place] and [place] Number:  Engineering geologist and Address:  [facility], [address], [place], [place] [address]      Provider Number: [id number]  Attending Physician Name and Address:  Gorden Harms, MD  Relative Name and Phone Number:  Sterling, Mondo [phone]  [phone]     Current Level of Care: Hospital Recommended Level of Care: [place] Prior Approval Number:    Date Approved/Denied:   PASRR Number:  [id number]   Discharge Plan: SNF    Current Diagnoses: Patient Active Problem List   Diagnosis Date Noted  . Respiratory failure with hypoxia and hypercapnia ([place]) 04/30/2017  . DNR (do not resuscitate) discussion   . Palliative care by specialist   . Goals of care, counseling/discussion   . COPD exacerbation ([place]) 03/25/2017  . Acute on chronic respiratory failure ([place]) 03/22/2017  . Acute respiratory failure with hypoxia ([place]) 03/19/2017  . Acute CHF (congestive heart failure) ([place]) 03/12/2017  . Primary localized osteoarthritis of right knee 01/14/2016  . Essential hypertension   . Hyperlipidemia   . SSS (sick sinus syndrome) ([place])   . Unstable angina ([place]) 11/03/2014  . NSTEMI (non-ST elevated myocardial infarction) ([place])   . Effort angina ([place]) 11/02/2014  . S/P PTCA (percutaneous transluminal coronary angioplasty) 11/02/2014  . Coronary artery disease involving native coronary artery   . Prostate cancer ([place])   . Anxiety and depression 06/18/2014  . Fatigue 11/14/2013  . Typical atrial flutter ([place]) 10/31/2013  . Bilateral claudication of lower limb ([place])   . Coronary artery disease   . Paroxysmal atrial flutter ([place])   . Hypertension   . Carotid stenosis   . COPD, severe GOLD GRADE D 03/28/2013  . Solitary  pulmonary nodule 03/28/2013    Orientation RESPIRATION BLADDER Height & Weight     Self, Time, Situation, Place  O2(2L) Continent Weight: 214 lb 8 oz (97.3 kg) Height:  5\' 9"  (175.3 cm)  BEHAVIORAL SYMPTOMS/MOOD NEUROLOGICAL BOWEL NUTRITION STATUS      Continent Diet(Cardiac)  AMBULATORY STATUS COMMUNICATION OF NEEDS Skin   Limited Assist Verbally Normal                       Personal Care Assistance Level of Assistance  Bathing, Feeding, Dressing Bathing Assistance: Limited assistance Feeding assistance: Independent Dressing Assistance: Limited assistance     Functional Limitations Info  Sight, Hearing, Speech Sight Info: Adequate Hearing Info: Adequate Speech Info: Adequate    SPECIAL CARE FACTORS FREQUENCY  PT (By licensed PT)     PT Frequency: minimum 2x a week              Contractures Contractures Info: Not present    Additional Factors Info  Code Status, Allergies(Partial code)  Psychotropic medications Code Status Info: Partial Code Allergies Info: Sleep medications    PARoxetine (PAXIL) tablet 30 mg          Current Medications (05/01/2017):  This is the current hospital active medication list Current Facility-Administered Medications  Medication Dose Route Frequency Provider Last Rate Last Dose  . 0.9 %  sodium chloride infusion  250 mL Intravenous PRN Salary, Montell D, MD      . acetaminophen (TYLENOL) tablet 650 mg  650 mg Oral Q6H PRN Salary, Avel Peace, MD  Or  . acetaminophen (TYLENOL) suppository 650 mg  650 mg Rectal Q6H PRN Salary, Montell D, MD      . ALPRAZolam Duanne Moron) tablet 0.25 mg  0.25 mg Oral BID PRN Salary, Montell D, MD      . amiodarone (PACERONE) tablet 200 mg  200 mg Oral Daily Salary, Montell D, MD   200 mg at 05/01/17 1035  . apixaban (ELIQUIS) tablet 5 mg  5 mg Oral BID Salary, Montell D, MD   5 mg at 05/01/17 1036  . arformoterol (BROVANA) nebulizer solution 15 mcg  15 mcg Nebulization BID Loney Hering D, MD   15  mcg at 05/01/17 0801  . aspirin EC tablet 81 mg  81 mg Oral Daily Salary, Montell D, MD   81 mg at 05/01/17 1035  . atorvastatin (LIPITOR) tablet 80 mg  80 mg Oral Daily Salary, Montell D, MD   80 mg at 05/01/17 1034  . benzonatate (TESSALON) capsule 100 mg  100 mg Oral TID PRN Salary, Montell D, MD      . bisacodyl (DULCOLAX) EC tablet 5 mg  5 mg Oral Daily PRN Salary, Montell D, MD      . budesonide (PULMICORT) nebulizer solution 0.5 mg  0.5 mg Nebulization Q12H Salary, Montell D, MD   0.5 mg at 05/01/17 0747  . ceFEPIme (MAXIPIME) 2 g in sodium chloride 0.9 % 100 mL IVPB  2 g Intravenous Q8H Salary, Avel Peace, MD   Stopped at 05/01/17 [id number]  . cholecalciferol (VITAMIN D) tablet 2,000 Units  2,000 Units Oral Daily Salary, Montell D, MD   2,000 Units at 05/01/17 1035  . docusate sodium (COLACE) capsule 100 mg  100 mg Oral BID Salary, Montell D, MD   100 mg at 05/01/17 1035  . furosemide (LASIX) injection 40 mg  40 mg Intravenous BID Loney Hering D, MD   40 mg at 05/01/17 1034  . guaiFENesin (MUCINEX) 12 hr tablet 600 mg  600 mg Oral BID Salary, Montell D, MD   600 mg at 05/01/17 1034  . HYDROcodone-acetaminophen (NORCO/VICODIN) 5-325 MG per tablet 1-2 tablet  1-2 tablet Oral Q4H PRN Salary, Montell D, MD      . ipratropium (ATROVENT) 0.06 % nasal spray 2 spray  2 spray Nasal QID Loney Hering D, MD   2 spray at 04/30/17 2100  . ipratropium (ATROVENT) nebulizer solution 0.5 mg  0.5 mg Nebulization Q6H Salary, Montell D, MD   0.5 mg at 05/01/17 0747  . levalbuterol (XOPENEX) nebulizer solution 0.63 mg  0.63 mg Nebulization Q6H Salary, Montell D, MD   0.63 mg at 05/01/17 0748  . lisinopril (PRINIVIL,ZESTRIL) tablet 2.5 mg  2.5 mg Oral Daily Salary, Montell D, MD   2.5 mg at 05/01/17 1036  . meclizine (ANTIVERT) tablet 12.5-25 mg  12.5-25 mg Oral TID PRN Salary, Holly Bodily D, MD      . MEDLINE mouth rinse  15 mL Mouth Rinse BID Salary, Montell D, MD   15 mL at 04/30/17 2111  . methylPREDNISolone  sodium succinate (SOLU-MEDROL) 125 mg/2 mL injection 60 mg  60 mg Intravenous Q6H Salary, Montell D, MD   60 mg at 05/01/17 1034  . metoprolol tartrate (LOPRESSOR) injection 2.5 mg  2.5 mg Intravenous Q6H PRN Salary, Montell D, MD   2.5 mg at 04/30/17 1411  . metoprolol tartrate (LOPRESSOR) tablet 12.5 mg  12.5 mg Oral BID Salary, Montell D, MD   12.5 mg at 05/01/17 1035  . nitroGLYCERIN (NITROSTAT) SL tablet  0.4 mg  0.4 mg Sublingual Q5 min PRN Salary, Montell D, MD      . ondansetron (ZOFRAN) tablet 4 mg  4 mg Oral Q6H PRN Salary, Montell D, MD       Or  . ondansetron (ZOFRAN) injection 4 mg  4 mg Intravenous Q6H PRN Salary, Montell D, MD      . pantoprazole (PROTONIX) EC tablet 40 mg  40 mg Oral Daily Salary, Montell D, MD   40 mg at 05/01/17 1036  . PARoxetine (PAXIL) tablet 30 mg  30 mg Oral Daily Salary, Montell D, MD   30 mg at 05/01/17 1036  . protein supplement (PREMIER PROTEIN) liquid  11 oz Oral Q24H Salary, Montell D, MD   11 oz at 05/01/17 1052  . senna-docusate (Senokot-S) tablet 1 tablet  1 tablet Oral QHS PRN Salary, Montell D, MD      . sodium chloride flush (NS) 0.9 % injection 3 mL  3 mL Intravenous Q12H Salary, Montell D, MD   3 mL at 05/01/17 1050  . sodium chloride flush (NS) 0.9 % injection 3 mL  3 mL Intravenous PRN Salary, Montell D, MD      . sodium phosphate (FLEET) 7-19 GM/118ML enema 1 enema  1 enema Rectal Once PRN Salary, Avel Peace, MD         Discharge Medications: Please see discharge summary for a list of discharge medications.  Relevant Imaging Results:  Relevant Lab Results:   Additional Information SSN [id number]  Ross Ludwig, [place]

## 2017-05-01 NOTE — Progress Notes (Addendum)
Daily Progress Note   Patient Name: Donald Juarez       Date: 05/01/2017 DOB: 1945-10-07  Age: 72 y.o. MRN#: [id number] Attending Physician: Gorden Harms, MD Primary Care Physician: Sofie Hartigan, MD Admit Date: 04/30/2017  Reason for Consultation/Follow-up: Establishing goals of care and Psychosocial/spiritual support  Subjective: Patient alert and oriented x 3 sitting up in bed. His 2 sons are at the bedside. Patient denies pain or discomfort. Verbalizes it is important for him to go home today as his son is flying back home to [place] and he would like to go and visit his wife prior to them leaving. Bedside RN at bedside administering medications. Sons verbalized they are going to take him home with home services with hopes of placing him into a facility with his wife. Would like Palliative services to follow in the outpatient setting. MOST form completed at bedside with patient and sons. Educated the sons on Hosp[facility] and [facility]ospice's philosophy.   Length of Stay: 1  Current Medications: Scheduled Meds:  . amiodarone  200 mg Oral Daily  . apixaban  5 mg Oral BID  . arformoterol  15 mcg Nebulization BID  . aspirin EC  81 mg Oral Daily  . atorvastatin  80 mg Oral Daily  . budesonide  0.5 mg Nebulization Q12H  . cholecalciferol  2,000 Units Oral Daily  . docusate sodium  100 mg Oral BID  . furosemide  40 mg Intravenous BID  . guaiFENesin  600 mg Oral BID  . ipratropium  2 spray Nasal QID  . ipratropium  0.5 mg Nebulization Q6H  . levalbuterol  0.63 mg Nebulization Q6H  . lisinopril  2.5 mg Oral Daily  . mouth rinse  15 mL Mouth Rinse BID  . methylPREDNISolone (SOLU-MEDROL) injection  60 mg Intravenous Q6H  . metoprolol tartrate  12.5 mg Oral BID  .  pantoprazole  40 mg Oral Daily  . PARoxetine  30 mg Oral Daily  . protein supplement shake  11 oz Oral Q24H  . sodium chloride flush  3 mL Intravenous Q12H    Continuous Infusions: . sodium chloride    . ceFEPime (MAXIPIME) IV Stopped (05/01/17 0552)    PRN Meds: sodium chloride, acetaminophen **OR** acetaminophen, ALPRAZolam, benzonatate, bisacodyl, HYDROcodone-acetaminophen, meclizine, metoprolol tartrate, nitroGLYCERIN, ondansetron **OR**  ondansetron (ZOFRAN) IV, senna-docusate, sodium chloride flush, sodium phosphate  Physical Exam  Constitutional: He is oriented to person, place, and time. Vital signs are normal. He appears well-developed. He is cooperative.  Cardiovascular: Normal rate, regular rhythm and intact distal pulses.  Pulmonary/Chest: Effort normal.  No respiratory distress of shortness of breath noted   Musculoskeletal:  Generalized weakness   Neurological: He is alert and oriented to person, place, and time.  Skin:  Some scattered bruising on BUE.  Psychiatric: He has a normal mood and affect. His speech is normal and behavior is normal. Judgment and thought content normal. Cognition and memory are normal.  Nursing note and vitals reviewed.           Vital Signs: BP 128/64   Pulse (!) 117   Temp 98.2 F (36.8 C) (Oral)   Resp 20   Ht 5\' 9"  (1.753 m)   Wt 97.3 kg (214 lb 8 oz)   SpO2 97%   BMI 31.68 kg/m  SpO2: SpO2: 97 % O2 Device: O2 Device: Nasal Cannula O2 Flow Rate: O2 Flow Rate (L/min): 2 L/min  Intake/output summary:   Intake/Output Summary (Last 24 hours) at 05/01/2017 1134 Last data filed at 05/01/2017 0438 Gross per 24 hour  Intake 2380 ml  Output 3250 ml  Net -870 ml   LBM: Last BM Date: 04/30/17 Baseline Weight: Weight: 91.2 kg (201 lb) Most recent weight: Weight: 97.3 kg (214 lb 8 oz)       Palliative Assessment/Data:PPS 50%      Patient Active Problem List   Diagnosis Date Noted  . Respiratory failure with hypoxia and  hypercapnia ([place]) 04/30/2017  . DNR (do not resuscitate) discussion   . Palliative care by specialist   . Goals of care, counseling/discussion   . COPD exacerbation ([place]) 03/25/2017  . Acute on chronic respiratory failure ([place]) 03/22/2017  . Acute respiratory failure with hypoxia ([place]) 03/19/2017  . Acute CHF (congestive heart failure) ([place]) 03/12/2017  . Primary localized osteoarthritis of right knee 01/14/2016  . Essential hypertension   . Hyperlipidemia   . SSS (sick sinus syndrome) ([place])   . Unstable angina ([place]) 11/03/2014  . NSTEMI (non-ST elevated myocardial infarction) ([place])   . Effort angina ([place]) 11/02/2014  . S/P PTCA (percutaneous transluminal coronary angioplasty) 11/02/2014  . Coronary artery disease involving native coronary artery   . Prostate cancer ([place])   . Anxiety and depression 06/18/2014  . Fatigue 11/14/2013  . Typical atrial flutter ([place]) 10/31/2013  . Bilateral claudication of lower limb ([place])   . Coronary artery disease   . Paroxysmal atrial flutter ([place])   . Hypertension   . Carotid stenosis   . COPD, severe GOLD GRADE D 03/28/2013  . Solitary pulmonary nodule 03/28/2013    Palliative Care Assessment & Plan   Patient Profile: 72 y.o. male  admitted from home via EMS on 04/30/2017 with complaints of shortness of breath, chest tightness, dyspnea, and requiring bipap in route via EMS. He has a past medical history of angina, anxieyt, asthma, carotid stenosis, COPD, coronary artery disease, CHF, hypertension, MI (2015), a-flutter, prostate cancer, rectal cancer, and seizures. He was recently discharged from the hospital with similar presentation about 1 week ago and noted to have numerous frequent hospitalizations. During his ED course he was weaned off of bipap and placed on nasal cannula. His chest x-ray noted mild edema, BNP greater than 700, white count 14,000, and urinalysis suspicious for UTI-has chronic indwelling Foley catheter. He was admitted  for acute  on chronic hypoxic hypercapnic respiratory failure secondary to multifactorial process which includes acute on COPD exacerbation >acute on chronic systolic congestive heart failure. Palliative Medicine team consulted for goals of care discussion.   Recommendations/Plan:  Limited Code -per patient's request. Verbalizes knowing the severity of his health conditions. Would like to have CPR with no intubation or defibrillation, ok to use bipap, IVF, antibiotics, and medications  MOST form completed at family's request   Continue to treat the treatable per medical team per patient's request  Case Manager to continue working with arrangements for outpatient Palliative services.   Palliative will continue to support patient, family, and medical team during hospitalization.   Goals of Care and Additional Recommendations:  Limitations on Scope of Treatment: Full Scope Treatment  Code Status:    Code Status Orders  (From admission, onward)        Start     Ordered   05/01/17 1038  Limited resuscitation (code)  Continuous    Question Answer Comment  In the event of cardiac or respiratory ARREST: Initiate Code Blue, Call Rapid Response Yes   In the event of cardiac or respiratory ARREST: Perform CPR Yes   In the event of cardiac or respiratory ARREST: Perform Intubation/Mechanical Ventilation No   In the event of cardiac or respiratory ARREST: Use NIPPV/BiPAp only if indicated Yes   In the event of cardiac or respiratory ARREST: Administer ACLS medications if indicated Yes   In the event of cardiac or respiratory ARREST: Perform Defibrillation or Cardioversion if indicated No      05/01/17 1037    Code Status History    Date Active Date Inactive Code Status Order ID Comments User Context   04/30/2017 1656 05/01/2017 1037 DNR [id number]  Jimmy Footman, NP Inpatient   04/30/2017 0828 04/30/2017 1656 Full Code [id number]  Gorden Harms, MD Inpatient   04/17/2017 1610 04/22/2017  1754 Full Code [id number]  Dustin Flock, MD Inpatient   03/25/2017 0823 03/28/2017 1732 Full Code [id number]  Hillary Bow, MD ED   03/19/2017 0829 03/23/2017 1502 Full Code [id number]  Epifanio Lesches, MD ED   03/12/2017 1135 03/14/2017 1653 Full Code [id number]  Dustin Flock, MD ED   02/07/2017 1927 02/10/2017 0825 Full Code [id number]  Nicholes Mango, MD Inpatient   01/14/2016 1122 01/17/2016 1659 Full Code [id number]  [name], MD Inpatient   11/03/2014 1640 11/04/2014 1357 Full Code [id number]  [name], MD Inpatient   11/02/2014 1333 11/03/2014 1640 Full Code [id number]  [name], MD Inpatient   10/31/2013 1208 11/01/2013 0102 Full Code [id number]  [name], MD Inpatient      Prognosis:   Unable to determine Guarded to Poor in the setting of frequent hospitalizations, CHF, COPD, recurrent respiratory failure, hypoxia, hypercapnia, CAD  Discharge Planning:  Home with Palliative Services and HH per family's request. Son's verbalized working to get him placed in a facility with his wife so that they could be together and he would have someone to assist him with his needs. Did not want to remain in the hospital or allow case manager/social work to assist with this process as it is most important for him to be discharged and visit with family.   Care plan was discussed with bedside RN, patient, patient's family, Hassan Rowan, [place], and Dr. Jerelyn Charles involved in discussion.   Thank you for allowing the Palliative Medicine Team to assist in the care of this patient.   Total Time 45 min  Prolonged Time Billed  No       Greater than 50%  of this time was spent counseling and coordinating care related to the above assessment and plan.  Alda Lea, NP-BC Ihor Dow, NP-BC Palliative Medicine Team  Phone: [phone] Fax: [phone]  Please contact Palliative Medicine Team phone at [phone] for questions and concerns.

## 2017-05-01 NOTE — Evaluation (Signed)
Physical Therapy Evaluation Patient Details Name: Donald Juarez MRN: [id number] DOB: 04/27/1945 Today's Date: 05/01/2017   History of Present Illness  Pt is a 72 year old male admitted for respiratory failure with hypoxia.  PMH includes syncope and collapse, MI, COPD, asthma, arthritis, anxiety, carotid stenosis, cataracts of L and R eye.  Clinical Impression  Pt is a 72 year old male who lives in a two story home alone.  Pt is independent with mobility at baseline but does have a RW and SPC available if needed.  Pt is independent with bed mobility and STS transfer.  He was able to ambulate 150 ft, 75 ft with RW and 75 ft pushing O2 tank.  Pt demonstrated mild increase in fatigue following ambulation but O2 sats remained at 95%.  Pt presented with WNL UE/LE strength and ROM and no decreased sensation of bilateral feet.  Pt will continue to benefit from skilled PT for tolerance to activity,balance, safe use of RW and strength and cardiac rehabilitation or [name]'s [facility] program to continue to improve endurance and tolerance to activity.    Follow Up Recommendations Home health PT(Pt would benefit from cardiac rehab or [name]'s [facility] supervised exercise program.)    Equipment Recommendations       Recommendations for Other Services       Precautions / Restrictions Precautions Precautions: Fall Restrictions Weight Bearing Restrictions: No      Mobility  Bed Mobility Overal bed mobility: Independent                Transfers Overall transfer level: Independent                  Ambulation/Gait Ambulation/Gait assistance: Supervision Ambulation Distance (Feet): 150 Feet Assistive device: Rolling walker (2 wheeled)     Gait velocity interpretation: at or above normal speed for age/gender General Gait Details: Pt ambulated around nurses station, 75 ft with RW and 75 ft pushing O2 tank.  Pt presented with moderate foot clearance and good step length.  Pt presented  with mild fatigue following but O2 measured 95%.  Stairs            Wheelchair Mobility    Modified Rankin (Stroke Patients Only)       Balance Overall balance assessment: Independent                                           Pertinent Vitals/Pain Pain Assessment: No/denies pain    Home Living Family/patient expects to be discharged to:: Private residence Living Arrangements: Alone Available Help at Discharge: Family;Available PRN/intermittently Type of Home: House Home Access: Stairs to enter Entrance Stairs-Rails: Can reach both Entrance Stairs-Number of Steps: 3 Home Layout: Two level;Able to live on main level with bedroom/bathroom        Prior Function Level of Independence: Independent with assistive device(s)         Comments: Independent with all ADL's.     Hand Dominance        Extremity/Trunk Assessment   Upper Extremity Assessment Upper Extremity Assessment: Overall WFL for tasks assessed    Lower Extremity Assessment Lower Extremity Assessment: Overall WFL for tasks assessed    Cervical / Trunk Assessment Cervical / Trunk Assessment: Normal  Communication   Communication: No difficulties  Cognition Arousal/Alertness: Awake/alert Behavior During Therapy: WFL for tasks assessed/performed Overall Cognitive Status: Within Functional  Limits for tasks assessed                                        General Comments      Exercises     Assessment/Plan    PT Assessment Patient needs continued PT services  PT Problem List Decreased activity tolerance;Cardiopulmonary status limiting activity;Decreased balance       PT Treatment Interventions DME instruction;Therapeutic activities;Therapeutic exercise;Stair training;Balance training;Functional mobility training;Patient/family education    PT Goals (Current goals can be found in the Care Plan section)  Acute Rehab PT Goals Patient Stated Goal: To go  home and continue home health PT. PT Goal Formulation: With patient Time For Goal Achievement: 05/15/17 Potential to Achieve Goals: Good    Frequency Min 2X/week   Barriers to discharge        Co-evaluation               AM-PAC PT "6 Clicks" Daily Activity  Outcome Measure Difficulty turning over in bed (including adjusting bedclothes, sheets and blankets)?: None Difficulty moving from lying on back to sitting on the side of the bed? : None Difficulty sitting down on and standing up from a chair with arms (e.g., wheelchair, bedside commode, etc,.)?: A Little Help needed moving to and from a bed to chair (including a wheelchair)?: A Little Help needed walking in hospital room?: A Little Help needed climbing 3-5 steps with a railing? : A Little 6 Click Score: 20    End of Session Equipment Utilized During Treatment: Gait belt;Oxygen Activity Tolerance: Patient tolerated treatment well Patient left: in bed;with bed alarm set;with call bell/phone within reach;with family/visitor present Nurse Communication: Mobility status PT Visit Diagnosis: Muscle weakness (generalized) (M62.81);Unsteadiness on feet (R26.81)    Time: 2993-7169 PT Time Calculation (min) (ACUTE ONLY): 25 min   Charges:   PT Evaluation $PT Eval Low Complexity: 1 Low     PT G Codes:   PT G-Codes **NOT FOR INPATIENT CLASS** Functional Assessment Tool Used: AM-PAC 6 Clicks Basic Mobility    [name], PT, DPT   [name] 05/01/2017, 9:40 AM

## 2017-05-01 NOTE — Progress Notes (Signed)
Pt  Discharged to [facility] per sons.via car. Alert. No resp distress. 02 2l [place]. Instructions given to pt and sons. Verbalizes understanding.  No c/o. Left via w/c.packet to son.

## 2017-05-01 NOTE — Discharge Summary (Addendum)
[facility] [place] NAME: Donald Juarez    MR#:  [id number]  DATE OF BIRTH:  1946/01/01  DATE OF ADMISSION:  04/30/2017 ADMITTING PHYSICIAN: Avel Peace Enyla Lisbon, MD  DATE OF DISCHARGE: No discharge date for patient encounter.  PRIMARY CARE PHYSICIAN: Sofie Hartigan, MD    ADMISSION DIAGNOSIS:  Acute respiratory distress [R06.03] COPD exacerbation (HCC) [J44.1]  DISCHARGE DIAGNOSIS:  Active Problems:   Acute on chronic respiratory failure (HCC)   Respiratory failure with hypoxia and hypercapnia ([place])   DNR (do not resuscitate) discussion   Palliative care by specialist   Goals of care, counseling/discussion   SECONDARY DIAGNOSIS:   Past Medical History:  Diagnosis Date  . Anginal pain ([place])    feels this as jaw pain.  takes imdur for this. rarely uses nitro.  [name] Anxiety   . Arthritis   . Asthma   . Bilateral claudication of lower limb ([place])   . Carotid stenosis    a. 07/2013 Carotid U/S: 100% RICA (pt prev reported h/o 62%), LICA 95-28%, bilat ECA 50%, normal vertebrals and subclavians bilat-->F/U needed in 01/2014.  . Cataract of left eye   . Cataract, right    a. 10/2010 s/p cataract surgery   . COPD (chronic obstructive pulmonary disease) ([place])   . Coronary artery disease    a. 06/1996 & 03/2005 Cath: non-obs dzs;  b. 07/2009 NSTEMI/Cath: LCX 99->PCI/DES extending into OM1;  c. 01/2013 Cath: patent LCX stent-->Med Rx.; d. cath 03/2014: RPAV dzs->Med rx; e. 10/2014 Cath/PCI: LM 30, LAD 20p/m, LCX 50ost/p, 10 ISR, 60d, OM1 min irregs, OM2 50, RCA 30ost, 40m, 60d, RPDA 70(2.5x8 Xience DES), RPAV 90small(PTCA); f. 10/2014 Relook Cath: RPAV 99(Med Rx), EF 45-50.  [name] Dysrhythmia    since ablation, flutter resolved.  . Essential hypertension   . Hyperkalemia   . Hyperlipidemia   . Myocardial infarction ([place]) 2015   had stent inserted and then had heart attack after. stent inserted due to blockage  . Paroxysmal atrial flutter ([place])     a. CHA2DS2VASc = 3 -> eliquis;  b. 07/2013 Echo: EF 50-55%, mildly dil LA.c. s/p RFCA 10/31/2013 by Dr Caryl Comes  . Prostate cancer ([place])   . Pulmonary nodule, right    a. 0.7cm RLL - stable by CT 06/2013.  [name] Rectal cancer ([place])    a. Status post colostomy  . Seizures ([place])    a. last 30 years ago  . Shingles    a. 09/2012.  . SSS (sick sinus syndrome) ([place])    a. 12/2009 S/P MDT [place] ADDR01 DC PPM, ser # [id number] ([name]).  . Syncope and collapse     HOSPITAL COURSE:  1 acute recurrent hypoxic hypercapnic respiratory failure Resolved Noted numerous frequent hospitalizations for similar presentation, last discharged less than 1 week ago prior to admission Secondary to multifactorial process which includes acute on COPD exacerbation >> acute on chronic systolic congestive heart failure exacerbation Successfully weaned to his O2 requirement of 2 L via nasal cannula at home continuous, pulmonology to see patient while in house-recommended prednisone 20 mg daily indefinitely/Bactrim 800 mg daily/recommended BiPAP or trilogy device for home-patient to follow-up with pulmonology status post discharge in 1 week for reevaluation  2 acute on COPD exacerbation Resolved Treated with IV Solu-Medrol while in house, inhaled corticosteroids twice daily, scheduled breathing treatments, respiratory therapy did see pt while in house, mucolytic agents, empiric azithromycin   3 acute on chronic systolic congestive heart failure  exacerbation, mild EF35-40% noted on most recent echocardiogram IV Lasix twice daily, on Eliquis, continue aspirin, statin therapy, strict I&O monitoring, daily weights, Lopressor, lisinopril  4 chronic A. Fib Stable Continued Eliquis, Lopressor, amiodarone  5 chronic GERD without esophagitis Stable PPI daily  6 chronic hyperlipidemia, unspecified Stable  continued statin therapy  7 acute complicated Enterococcus faecalis UTI  sensitivities pending at the time of  discharge Patient discharge per family request Ampicillin twice daily for 5-day course  8 chronic urinary retention Continue chronic indwelling Foley catheter follow-up with urology in 3-5 days for reevaluation  DISCHARGE CONDITIONS:  On the day of discharge patient is afebrile, hemodynamically stable, tolerating diet, ready for discharge to skilled nursing facility as private pay, to follow-up with pulmonology as directed, follow-up with urology in 3-5 days for chronic indwelling Foley, palliative care services to follow, follow-up primary care provider 3-5 days for reevaluation, for more specific details please see chart   CONSULTS OBTAINED:  Treatment Team:  Laverle Hobby, MD Yolonda Kida, MD  DRUG ALLERGIES:   Allergies  Allergen Reactions  . Other     Sleep medications cause hallucinations, agitation per patient's son. Did not know which one.    DISCHARGE MEDICATIONS:   Allergies as of 05/01/2017      Reactions   Other    Sleep medications cause hallucinations, agitation per patient's son. Did not know which one.      Medication List    TAKE these medications   ALPRAZolam 0.25 MG tablet Commonly known as:  XANAX Take 1 tablet by mouth 2 (two) times daily as needed.   amiodarone 200 MG tablet Commonly known as:  PACERONE Take 200 mg by mouth daily.   ampicillin 500 MG capsule Commonly known as:  PRINCIPEN Take 1 capsule (500 mg total) by mouth 4 (four) times daily.   arformoterol 15 MCG/2ML Nebu Commonly known as:  BROVANA Take 2 mLs (15 mcg total) by nebulization 2 (two) times daily. Take 2 mLs (15 mcg total) by nebulization 2 (two) times daily. DX code 493.20 Every morning and 1800   aspirin EC 81 MG tablet Take 81 mg by mouth daily.   atorvastatin 80 MG tablet Commonly known as:  LIPITOR Take 1 tablet (80 mg total) by mouth daily.   benzonatate 100 MG capsule Commonly known as:  TESSALON Take 1 capsule (100 mg total) by mouth 3 (three)  times daily as needed for cough.   budesonide 0.5 MG/2ML nebulizer solution Commonly known as:  PULMICORT Take 2 mLs (0.5 mg total) by nebulization 2 (two) times daily. DX code 493.20 Every morning and 1800   budesonide-formoterol 160-4.5 MCG/ACT inhaler Commonly known as:  SYMBICORT Inhale 2 puffs into the lungs 2 (two) times daily.   ELIQUIS 5 MG Tabs tablet Generic drug:  apixaban Take 5 mg by mouth 2 (two) times daily.   furosemide 40 MG tablet Commonly known as:  LASIX Take 1 tablet (40 mg total) by mouth 2 (two) times daily. What changed:  when to take this   ipratropium 0.06 % nasal spray Commonly known as:  ATROVENT Place 2 sprays into the nose 4 (four) times daily.   levalbuterol 0.63 MG/3ML nebulizer solution Commonly known as:  XOPENEX Take 3 mLs (0.63 mg total) by nebulization every 4 (four) hours as needed for wheezing or shortness of breath.   lisinopril 2.5 MG tablet Commonly known as:  PRINIVIL,ZESTRIL Take 1 tablet (2.5 mg total) by mouth daily. What changed:  medication strength  how much to take   meclizine 12.5 MG tablet Commonly known as:  ANTIVERT Take 1-2 tablets by mouth 3 (three) times daily as needed.   metoprolol tartrate 25 MG tablet Commonly known as:  LOPRESSOR Take 0.5 tablets (12.5 mg total) by mouth 2 (two) times daily.   nitroGLYCERIN 0.4 MG SL tablet Commonly known as:  NITROSTAT Place 1 tablet (0.4 mg total) under the tongue every 5 (five) minutes as needed for chest pain.   omeprazole 40 MG capsule Commonly known as:  PRILOSEC Take 40 mg by mouth daily.   PARoxetine 30 MG tablet Commonly known as:  PAXIL Take 30 mg by mouth daily.   predniSONE 50 MG tablet Commonly known as:  DELTASONE Take 1 tablet (50 mg total) by mouth daily for 4 days.   predniSONE 20 MG tablet Commonly known as:  DELTASONE Take 1 tablet (20 mg total) by mouth daily.   protein supplement shake Liqd Commonly known as:  PREMIER PROTEIN Take  325 mLs (11 oz total) by mouth daily.   sulfamethoxazole-trimethoprim 800-160 MG tablet Commonly known as:  BACTRIM DS,SEPTRA DS Take 1 tablet by mouth daily.   Tiotropium Bromide Monohydrate 1.25 MCG/ACT Aers Commonly known as:  SPIRIVA RESPIMAT Inhale 2 puffs into the lungs 2 (two) times daily.   Vitamin D 2000 units tablet Take 2,000 Units by mouth daily.        DISCHARGE INSTRUCTIONS:  If you experience worsening of your admission symptoms, develop shortness of breath, life threatening emergency, suicidal or homicidal thoughts you must seek medical attention immediately by calling 911 or calling your MD immediately  if symptoms less severe.  You Must read complete instructions/literature along with all the possible adverse reactions/side effects for all the Medicines you take and that have been prescribed to you. Take any new Medicines after you have completely understood and accept all the possible adverse reactions/side effects.   Please note  You were cared for by a hospitalist during your hospital stay. If you have any questions about your discharge medications or the care you received while you were in the hospital after you are discharged, you can call the unit and asked to speak with the hospitalist on call if the hospitalist that took care of you is not available. Once you are discharged, your primary care physician will handle any further medical issues. Please note that NO REFILLS for any discharge medications will be authorized once you are discharged, as it is imperative that you return to your primary care physician (or establish a relationship with a primary care physician if you do not have one) for your aftercare needs so that they can reassess your need for medications and monitor your lab values.    Today   CHIEF COMPLAINT:   Chief Complaint  Patient presents with  . Respiratory Distress    HISTORY OF PRESENT ILLNESS:  72 y.o. male with a known history per  below, recently discharged from the hospital with similar presentation 1 week ago, noted numerous frequent hospitalizations for the same thing, presenting to the emergency room via EMS for shortness of breath, chest tightness, dyspnea, patient requiring BiPAP in route via EMS as well as in the emergency room-was successful in being weaned off, chest x-ray noted for mild edema, BNP greater than 700, white count 14,000, urinalysis suspicious for UTI-has chronic indwelling Foley catheter, patient evaluated in the emergency room, son at the bedside, patient states "I do not know what happened-I just  got short of breath, " patient is now being admitted for acute on chronic hypoxic hypercapnic respiratory failure secondary to multifactorial process which includes acute on COPD exacerbation > acute on chronic systolic congestive heart failure. VITAL SIGNS:  Blood pressure 128/64, pulse (!) 117, temperature 98.2 F (36.8 C), temperature source Oral, resp. rate 20, height 5\' 9"  (1.753 m), weight 97.3 kg (214 lb 8 oz), SpO2 97 %.  I/O:    Intake/Output Summary (Last 24 hours) at 05/01/2017 1132 Last data filed at 05/01/2017 0438 Gross per 24 hour  Intake 2380 ml  Output 3250 ml  Net -870 ml    PHYSICAL EXAMINATION:  GENERAL:  72 y.o.-year-old patient lying in the bed with no acute distress.  EYES: Pupils equal, round, reactive to light and accommodation. No scleral icterus. Extraocular muscles intact.  HEENT: Head atraumatic, normocephalic. Oropharynx and nasopharynx clear.  NECK:  Supple, no jugular venous distention. No thyroid enlargement, no tenderness.  LUNGS: Normal breath sounds bilaterally, no wheezing, rales,rhonchi or crepitation. No use of accessory muscles of respiration.  CARDIOVASCULAR: S1, S2 normal. No murmurs, rubs, or gallops.  ABDOMEN: Soft, non-tender, non-distended. Bowel sounds present. No organomegaly or mass.  EXTREMITIES: No pedal edema, cyanosis, or clubbing.  NEUROLOGIC: Cranial  nerves II through XII are intact. Muscle strength 5/5 in all extremities. Sensation intact. Gait not checked.  PSYCHIATRIC: The patient is alert and oriented x 3.  SKIN: No obvious rash, lesion, or ulcer.   DATA REVIEW:   CBC Recent Labs  Lab 04/30/17 0309  WBC 14.3*  HGB 10.6*  HCT 33.0*  PLT 274    Chemistries  Recent Labs  Lab 04/30/17 0309 05/01/17 0701  NA 140 141  K 4.0 4.0  CL 104 106  CO2 21* 28  GLUCOSE 149* 176*  BUN 29* 38*  CREATININE 1.23 1.12  CALCIUM 7.9* 8.5*  AST 63*  --   ALT 48  --   ALKPHOS 121  --   BILITOT 0.5  --     Cardiac Enzymes Recent Labs  Lab 04/30/17 0309  TROPONINI 0.03*    Microbiology Results  Results for orders placed or performed during the hospital encounter of 04/30/17  Culture, blood (routine x 2)     Status: None (Preliminary result)   Collection Time: 04/30/17  3:09 AM  Result Value Ref Range Status   Specimen Description BLOOD BLOOD LEFT HAND  Final   Special Requests   Final    BOTTLES DRAWN AEROBIC AND ANAEROBIC Blood Culture adequate volume   Culture   Final    NO GROWTH 1 DAY Performed at [facility], [address] [place], [place] [address]    Report Status PENDING  Incomplete  Culture, blood (routine x 2)     Status: None (Preliminary result)   Collection Time: 04/30/17  3:09 AM  Result Value Ref Range Status   Specimen Description BLOOD BLOOD RIGHT HAND  Final   Special Requests   Final    BOTTLES DRAWN AEROBIC AND ANAEROBIC Blood Culture results may not be optimal due to an inadequate volume of blood received in culture bottles   Culture   Final    NO GROWTH 1 DAY Performed at [facility], [address] [place], [place] [address]    Report Status PENDING  Incomplete  Urine culture     Status: Abnormal (Preliminary result)   Collection Time: 04/30/17  3:09 AM  Result Value Ref Range Status   Specimen Description  Final    URINE, RANDOM Performed at [facility],  [address] [place], [place] [address]    Special Requests   Final    NONE Performed at [facility], [address] [place], [place] [address]    Culture >=100,000 COLONIES/mL ENTEROCOCCUS FAECALIS (A)  Final   Report Status PENDING  Incomplete  Culture, expectorated sputum-assessment     Status: None   Collection Time: 05/01/17  5:07 AM  Result Value Ref Range Status   Specimen Description EXPECTORATED SPUTUM  Final   Special Requests NONE  Final   Sputum evaluation   Final    THIS SPECIMEN IS ACCEPTABLE FOR SPUTUM CULTURE Performed at [facility], [address], [place], [place] [address]    Report Status 05/01/2017 FINAL  Final    RADIOLOGY:  Dg Chest Port 1 View  Result Date: 04/30/2017 CLINICAL DATA:  Respiratory distress EXAM: PORTABLE CHEST 1 VIEW COMPARISON:  04/18/2017 FINDINGS: Stable cardiomegaly with aortic atherosclerosis. The patient is status post CABG. Right atrial and right ventricular leads are stable appearance with left-sided pacemaker apparatus projecting over the axilla. Interstitial edema is redemonstrated without acute pneumonic consolidation. No acute osseous abnormality. IMPRESSION: 1. Cardiomegaly with aortic atherosclerosis and post CABG change. 2. Redemonstration of mild interstitial edema. Electronically Signed   By: Ashley Royalty M.D.   On: 04/30/2017 03:56    EKG:   Orders placed or performed during the hospital encounter of 04/30/17  . EKG 12-Lead  . EKG 12-Lead      Management plans discussed with the patient, family and they are in agreement.  CODE STATUS:     Code Status Orders  (From admission, onward)        Start     Ordered   05/01/17 1038  Limited resuscitation (code)  Continuous    Question Answer Comment  In the event of cardiac or respiratory ARREST: Initiate Code Blue, Call Rapid Response Yes   In the event of cardiac or respiratory ARREST: Perform CPR Yes   In the event of cardiac or respiratory  ARREST: Perform Intubation/Mechanical Ventilation No   In the event of cardiac or respiratory ARREST: Use NIPPV/BiPAp only if indicated Yes   In the event of cardiac or respiratory ARREST: Administer ACLS medications if indicated Yes   In the event of cardiac or respiratory ARREST: Perform Defibrillation or Cardioversion if indicated No      05/01/17 1037    Code Status History    Date Active Date Inactive Code Status Order ID Comments User Context   04/30/2017 1656 05/01/2017 1037 DNR [id number]  Jimmy Footman, NP Inpatient   04/30/2017 0828 04/30/2017 1656 Full Code [id number]  Gorden Harms, MD Inpatient   04/17/2017 1610 04/22/2017 1754 Full Code [id number]  Dustin Flock, MD Inpatient   03/25/2017 0823 03/28/2017 1732 Full Code [id number]  Hillary Bow, MD ED   03/19/2017 0829 03/23/2017 1502 Full Code [id number]  Epifanio Lesches, MD ED   03/12/2017 1135 03/14/2017 1653 Full Code [id number]  Dustin Flock, MD ED   02/07/2017 1927 02/10/2017 0825 Full Code [id number]  Nicholes Mango, MD Inpatient   01/14/2016 1122 01/17/2016 1659 Full Code [id number]  [name], MD Inpatient   11/03/2014 1640 11/04/2014 1357 Full Code [id number]  [name], MD Inpatient   11/02/2014 1333 11/03/2014 1640 Full Code [id number]  [name], MD Inpatient   10/31/2013 1208 11/01/2013 0102 Full Code [id number]  [name], MD Inpatient  TOTAL TIME TAKING CARE OF THIS PATIENT: 45 minutes.    Avel Peace Mendi Constable M.D on 05/01/2017 at 11:32 AM  Between 7am to 6pm - Pager - [phone]  After 6pm go to [email or web address] - password [email or web address] [facility]  Office  [phone]  CC: Primary care physician; Sofie Hartigan, MD   Note: This dictation was prepared with Dragon dictation along with smaller phrase technology. Any transcriptional errors that result from this process are unintentional.

## 2017-05-01 NOTE — Telephone Encounter (Signed)
error 

## 2017-05-02 ENCOUNTER — Other Ambulatory Visit: Payer: Self-pay | Admitting: *Deleted

## 2017-05-02 ENCOUNTER — Telehealth: Payer: Self-pay | Admitting: Internal Medicine

## 2017-05-02 DIAGNOSIS — J441 Chronic obstructive pulmonary disease with (acute) exacerbation: Secondary | ICD-10-CM

## 2017-05-02 LAB — URINE CULTURE: Culture: >=100000 — AB

## 2017-05-02 NOTE — Telephone Encounter (Signed)
-----   Message from [place], [place] sent at 03/08/348  1:30 PM EDT ----- Regarding: FW: HFU Please schedule pt in a 30 min slot for a hosp f/u for COPD in 1-2 weeks with DR.  Thanks, Misty ----- Message ----- From: Laverle Hobby, MD Sent: 05/01/2017  12:43 PM To: [facility] Clinical Pool Subject: HFU                                            Needs HFU in 1-2 weeks for AECOPD.

## 2017-05-02 NOTE — Telephone Encounter (Signed)
lmov to schedule  °

## 2017-05-04 LAB — CULTURE, RESPIRATORY W GRAM STAIN

## 2017-05-04 LAB — CULTURE, RESPIRATORY

## 2017-05-05 LAB — CULTURE, BLOOD (ROUTINE X 2)
Culture: NO GROWTH
Culture: NO GROWTH
SPECIAL REQUESTS: ADEQUATE

## 2017-05-07 NOTE — Telephone Encounter (Signed)
Attempted to schedule with patient .  He suggested to coordinate with transportation at [facility].  Called to schedule held several minutes.  Will call again to confirm scheduled 4/19 appt

## 2017-05-08 ENCOUNTER — Other Ambulatory Visit: Payer: Self-pay | Admitting: *Deleted

## 2017-05-08 ENCOUNTER — Telehealth: Payer: Self-pay

## 2017-05-08 ENCOUNTER — Ambulatory Visit: Payer: Self-pay | Admitting: *Deleted

## 2017-05-08 NOTE — Telephone Encounter (Signed)
Per Patient spoke with [place] to confirm appt 4/19

## 2017-05-08 NOTE — Patient Outreach (Signed)
[name] Care Management  05/08/2017  Donald Juarez 09/25/1945 [id number]   Phone call to the discharge planner Donald Juarez at [facility] to discuss patient's status in rehab and the discharge plan. Per Donald once patient has completed his rehab he will be transitioning to [facility] in [place] with his wife for long term care.   Plan: Social work will not continue to follow due to plan to transition to long term care following his rehab stay.   Donald Juarez [facility] Care Management [phone]

## 2017-05-08 NOTE — Telephone Encounter (Signed)
This is the third attempt to make an appointment with this patient. He was very addiment the last time I contacted the patient that he did not schedule the appointment with the clinic therefore he did not want to come nor did he wish to be contacted by the clinic again.   Patient had a rehospitalization and an appointment was made with the clinic post discharge. Patient was discharged to [facility]. I contacted [place] about his appointment scheduled for 05/09/2017 and he is again refusing to come to the appointment. I advised the nurse at [facility] that we will place a note in the chart and no further appointments will be made.    Thank you for the referral.

## 2017-05-09 ENCOUNTER — Ambulatory Visit: Payer: Medicare Other | Admitting: Family

## 2017-05-09 ENCOUNTER — Other Ambulatory Visit: Payer: Self-pay | Admitting: *Deleted

## 2017-05-09 ENCOUNTER — Ambulatory Visit: Payer: Medicare Other | Admitting: Urology

## 2017-05-09 NOTE — Patient Outreach (Signed)
[facility]) Care Management   05/09/2017  THEORDORE CISNERO 1945/03/22 [id number]  [facility] episode resolved.  This RN CM was informed by [facility]  LCSW pt to transition to long term care following his rehab stay.    Zara Chess.   [place] Care Management  [phone]

## 2017-05-16 ENCOUNTER — Encounter: Payer: Self-pay | Admitting: Urology

## 2017-05-16 ENCOUNTER — Ambulatory Visit: Payer: Medicare Other | Admitting: Urology

## 2017-05-16 VITALS — BP 98/63 | HR 99 | Resp 16 | Ht 69.0 in | Wt 199.2 lb

## 2017-05-16 DIAGNOSIS — C61 Malignant neoplasm of prostate: Secondary | ICD-10-CM

## 2017-05-16 DIAGNOSIS — R339 Retention of urine, unspecified: Secondary | ICD-10-CM | POA: Insufficient documentation

## 2017-05-16 NOTE — Progress Notes (Signed)
Catheter Removal  Patient is present today for a catheter removal.  10ml of water was drained from the balloon. A 16FR foley cath was removed from the bladder no complications were noted . Patient tolerated well.  Preformed by: Chalise Pe, CMA 

## 2017-05-16 NOTE — Progress Notes (Signed)
05/16/2017 12:54 PM   Donald Juarez Mar 27, 1945 [id number]  Referring provider: Sofie Hartigan, [address], [place] [address]  Chief complaint: Catheter removal  HPI: 72 year old male who was hospitalized with respiratory failure in late March 2019.  He had an indwelling Foley at that hospitalization and the catheter was traumatically pulled in the prostatic urethra with subsequent bleeding.  He was seen by Dr. Pilar Jarvis and the catheter was repositioned back in the bladder.  He was discharged with an indwelling Foley with urologic follow-up recommended.  He has a history of high risk, Gleason 9 adenocarcinoma the prostate and underwent CyberKnife radiation at [facility] in 2016.  He was on androgen deprivation until August 2018.  He has regular follow-up with radiation oncology at [facility].  He did have urinary retention prior to his treatment and elected intermittent catheterization which he has continued.   PMH: Past Medical History:  Diagnosis Date  . Anginal pain ([place])    feels this as jaw pain.  takes imdur for this. rarely uses nitro.  [name] Anxiety   . Arthritis   . Asthma   . Bilateral claudication of lower limb ([place])   . Carotid stenosis    a. 07/2013 Carotid U/S: 100% RICA (pt prev reported h/o 24%), LICA 58-09%, bilat ECA 50%, normal vertebrals and subclavians bilat-->F/U needed in 01/2014.  . Cataract of left eye   . Cataract, right    a. 10/2010 s/p cataract surgery   . COPD (chronic obstructive pulmonary disease) ([place])   . Coronary artery disease    a. 06/1996 & 03/2005 Cath: non-obs dzs;  b. 07/2009 NSTEMI/Cath: LCX 99->PCI/DES extending into OM1;  c. 01/2013 Cath: patent LCX stent-->Med Rx.; d. cath 03/2014: RPAV dzs->Med rx; e. 10/2014 Cath/PCI: LM 30, LAD 20p/m, LCX 50ost/p, 10 ISR, 60d, OM1 min irregs, OM2 50, RCA 30ost, 58m, 60d, RPDA 70(2.5x8 Xience DES), RPAV 90small(PTCA); f. 10/2014 Relook Cath: RPAV 99(Med Rx), EF 45-50.  [name] Dysrhythmia    since ablation, flutter  resolved.  . Essential hypertension   . Hyperkalemia   . Hyperlipidemia   . Myocardial infarction ([place]) 2015   had stent inserted and then had heart attack after. stent inserted due to blockage  . Paroxysmal atrial flutter ([place])    a. CHA2DS2VASc = 3 -> eliquis;  b. 07/2013 Echo: EF 50-55%, mildly dil LA.c. s/p RFCA 10/31/2013 by Dr Caryl Comes  . Prostate cancer ([place])   . Pulmonary nodule, right    a. 0.7cm RLL - stable by CT 06/2013.  [name] Rectal cancer ([place])    a. Status post colostomy  . Seizures ([place])    a. last 30 years ago  . Shingles    a. 09/2012.  . SSS (sick sinus syndrome) ([place])    a. 12/2009 S/P MDT [place] ADDR01 DC PPM, ser # [id number] ([name]).  . Syncope and collapse     Surgical History: Past Surgical History:  Procedure Laterality Date  . ABLATION  10/31/2013   CTI ablation by Dr Caryl Comes for atrial flutter  . ATRIAL FLUTTER ABLATION N/A 10/31/2013   Procedure: ATRIAL FLUTTER ABLATION;  Surgeon: [name], MD;  Location: [facility] CATH LAB;  Service: Cardiovascular;  Laterality: N/A;  . CARDIAC [facility]  . CARDIAC CAT[facility]ATION  [facility]/2013   [facility]  . CARDIAC CATHETERIZATION  03/2014   [facility]  . CARDIAC CATHETERIZATION N/A 11/02/2014   Procedure: Left Heart Cath and Cors/Grafts Angiography;  Surgeon: [name], MD;  Location: [place] CV LAB;  Service: Cardiovascular;  Laterality: N/A;  . CARDIAC CATHETERIZATION N/A 11/02/2014   Procedure: Coronary Stent Intervention;  Surgeon: [name], MD;  Location: [place] CV LAB;  Service: Cardiovascular;  Laterality: N/A;  . CARDIAC CATHETERIZATION N/A 11/03/2014   Procedure: Left Heart Cath and Coronary Angiography;  Surgeon: [name], MD;  Location: [place] CV LAB;  Service: Cardiovascular;  Laterality: N/A;  . CARDIAC SURGERY  2011   Stents placed   . CATARACT EXTRACTION Left   . CATARACT EXTRACTION W/PHACO Left 06/01/2014   Procedure: CATARACT EXTRACTION PHACO AND INTRAOCULAR  LENS PLACEMENT (IOC);  Surgeon: [name], MD;  Location: [name] ORS;  Service: Ophthalmology;  Laterality: Left;  . cataract surgery  2012  . colon surgery     cancer removal  . COLON SURGERY     colostomy  . COLONOSCOPY WITH PROPOFOL N/A 05/24/2016   Procedure: COLONOSCOPY WITH PROPOFOL;  Surgeon: [name], MD;  Location: [facility] ENDOSCOPY;  Service: Endoscopy;  Laterality: N/A;  . CORONARY ANGIOPLASTY    . CORONARY ARTERY BYPASS GRAFT  02/13/2017   CABG x 3 @ [facility]   . INSERT / REPLACE / REMOVE PACEMAKER  2011   just a pacer  . KNEE ARTHROSCOPY Right 1980's  . LEFT HEART CATH AND CORONARY ANGIOGRAPHY N/A 02/08/2017   Procedure: LEFT HEART CATH AND CORONARY ANGIOGRAPHY;  Surgeon: [name], MD;  Location: [place] CV LAB;  Service: Cardiovascular;  Laterality: N/A;  . PACEMAKER PLACEMENT  01/17/2010  . TOTAL KNEE ARTHROPLASTY Right 01/14/2016   Procedure: TOTAL KNEE ARTHROPLASTY;  Surgeon: [name], MD;  Location: [name] ORS;  Service: Orthopedics;  Laterality: Right;    Home Medications:  Allergies as of 05/16/2017      Reactions   Amoxicillin-pot Clavulanate Diarrhea   Patient unaware 04/09/15   Other    Sleep medications cause hallucinations, agitation per patient's son. Did not know which one.      Medication List        Accurate as of 05/16/17 12:54 PM. Always use your most recent med list.          ALPRAZolam 0.25 MG tablet Commonly known as:  XANAX Take 1 tablet by mouth 2 (two) times daily as needed.   amiodarone 200 MG tablet Commonly known as:  PACERONE Take 200 mg by mouth daily.   ampicillin 500 MG capsule Commonly known as:  PRINCIPEN Take 1 capsule (500 mg total) by mouth 4 (four) times daily.   arformoterol 15 MCG/2ML Nebu Commonly known as:  BROVANA Take 2 mLs (15 mcg total) by nebulization 2 (two) times daily. Take 2 mLs (15 mcg total) by nebulization 2 (two) times daily. DX code 493.20 Every morning and 1800   aspirin  EC 81 MG tablet Take 81 mg by mouth daily.   atorvastatin 80 MG tablet Commonly known as:  LIPITOR Take 1 tablet (80 mg total) by mouth daily.   benzonatate 100 MG capsule Commonly known as:  TESSALON Take 1 capsule (100 mg total) by mouth 3 (three) times daily as needed for cough.   budesonide 0.5 MG/2ML nebulizer solution Commonly known as:  PULMICORT Take 2 mLs (0.5 mg total) by nebulization 2 (two) times daily. DX code 493.20 Every morning and 1800   budesonide-formoterol 160-4.5 MCG/ACT inhaler Commonly known as:  SYMBICORT Inhale 2 puffs into the lungs 2 (two) times daily.   ELIQUIS 5 MG Tabs tablet Generic drug:  apixaban  Take 5 mg by mouth 2 (two) times daily.   furosemide 40 MG tablet Commonly known as:  LASIX Take 1 tablet (40 mg total) by mouth 2 (two) times daily.   ipratropium 0.06 % nasal spray Commonly known as:  ATROVENT Place 2 sprays into the nose 4 (four) times daily.   levalbuterol 0.63 MG/3ML nebulizer solution Commonly known as:  XOPENEX Take 3 mLs (0.63 mg total) by nebulization every 4 (four) hours as needed for wheezing or shortness of breath.   lisinopril 2.5 MG tablet Commonly known as:  PRINIVIL,ZESTRIL Take 1 tablet (2.5 mg total) by mouth daily.   meclizine 12.5 MG tablet Commonly known as:  ANTIVERT Take 1-2 tablets by mouth 3 (three) times daily as needed.   metoprolol tartrate 25 MG tablet Commonly known as:  LOPRESSOR Take 0.5 tablets (12.5 mg total) by mouth 2 (two) times daily.   nitroGLYCERIN 0.4 MG SL tablet Commonly known as:  NITROSTAT Place 1 tablet (0.4 mg total) under the tongue every 5 (five) minutes as needed for chest pain.   omeprazole 40 MG capsule Commonly known as:  PRILOSEC Take 40 mg by mouth daily.   PARoxetine 30 MG tablet Commonly known as:  PAXIL Take 30 mg by mouth daily.   predniSONE 20 MG tablet Commonly known as:  DELTASONE Take 1 tablet (20 mg total) by mouth daily.   protein supplement shake  Liqd Commonly known as:  PREMIER PROTEIN Take 325 mLs (11 oz total) by mouth daily.   sulfamethoxazole-trimethoprim 800-160 MG tablet Commonly known as:  BACTRIM DS,SEPTRA DS Take 1 tablet by mouth daily.   Tiotropium Bromide Monohydrate 1.25 MCG/ACT Aers Commonly known as:  SPIRIVA RESPIMAT Inhale 2 puffs into the lungs 2 (two) times daily.   Vitamin D 2000 units tablet Take 2,000 Units by mouth daily.       Allergies:  Allergies  Allergen Reactions  . Amoxicillin-Pot Clavulanate Diarrhea    Patient unaware 04/09/15  . Other     Sleep medications cause hallucinations, agitation per patient's son. Did not know which one.    Family History: Family History  Problem Relation Age of Onset  . Cancer Mother        'male cancer"  . Hypertension Mother   . Hyperlipidemia Mother   . Heart disease Father   . Heart attack Father   . Hypertension Father   . Hyperlipidemia Father     Social History:  reports that he quit smoking about 8 years ago. His smoking use included cigarettes. He has a 30.00 pack-year smoking history. He has never used smokeless tobacco. He reports that he does not drink alcohol or use drugs.  ROS: UROLOGY Frequent Urination?: No Hard to postpone urination?: No Burning/pain with urination?: No Get up at night to urinate?: Yes Leakage of urine?: No Urine stream starts and stops?: No Trouble starting stream?: No Do you have to strain to urinate?: No Blood in urine?: Yes Urinary tract infection?: Yes Sexually transmitted disease?: No Injury to kidneys or bladder?: No Painful intercourse?: No Weak stream?: No Erection problems?: Yes Penile pain?: No  Gastrointestinal Nausea?: No Vomiting?: No Indigestion/heartburn?: No Diarrhea?: No Constipation?: No  Constitutional Fever: No Night sweats?: No Weight loss?: No Fatigue?: Yes  Skin Skin rash/lesions?: No Itching?: No  Eyes Blurred vision?: No Double vision?:  No  Ears/Nose/Throat Sore throat?: No Sinus problems?: No  Hematologic/Lymphatic Swollen glands?: No Easy bruising?: No  Cardiovascular Leg swelling?: No Chest pain?: No  Respiratory Cough?:  Yes Shortness of breath?: Yes  Endocrine Excessive thirst?: No  Musculoskeletal Back pain?: No Joint pain?: Yes  Neurological Headaches?: No Dizziness?: No  Psychologic Depression?: No Anxiety?: Yes  Physical Exam: BP 98/63   Pulse 99   Resp 16   Ht 5\' 9"  (1.753 m)   Wt 199 lb 3.2 oz (90.4 kg)   SpO2 98%   BMI 29.42 kg/m   Constitutional:  Alert and oriented, No acute distress. HEENT: [place] AT, moist mucus membranes.  Trachea midline, no masses. Cardiovascular: No clubbing, cyanosis, or edema. Respiratory: Normal respiratory effort, no increased work of breathing. GI: Abdomen is soft, nontender, nondistended, no abdominal masses GU: No CVA tenderness Lymph: No cervical or inguinal lymphadenopathy. Skin: No rashes, bruises or suspicious lesions. Neurologic: Grossly intact, no focal deficits, moving all 4 extremities. Psychiatric: Normal mood and affect.  Laboratory Data: Lab Results  Component Value Date   WBC 14.3 (H) 04/30/2017   HGB 10.6 (L) 04/30/2017   HCT 33.0 (L) 04/30/2017   MCV 88.1 04/30/2017   PLT 274 04/30/2017    Lab Results  Component Value Date   CREATININE 1.12 05/01/2017    Assessment & Plan:   72 year old male with history of prostate cancer and urinary retention on CIC.  His Foley catheter was removed.  He desired to continue intermittent catheterization.  He will keep his radiation oncology and urology follow-ups at [facility].   Abbie Sons, [place] [address] [place], [place] [address] [phone]

## 2017-05-17 NOTE — Progress Notes (Signed)
Cardiology Office Note  Date:  05/18/2017   ID:  Donald Juarez, DOB [birth year], MRN [id number]  PCP:  Sofie Hartigan, MD   Chief Complaint  Patient presents with  . Other    CABG. Meds reviewed verbally with pt.    HPI:  Donald Juarez is a 72 y.o.male patient who History of Atrial fibrillation and flutter , unspecified (CMS-HCC)  S/P CABG x 3  [facility] December 2018 Angina at rest (CMS-HCC)  Mixed hyperlipidemia  Bilateral carotid artery stenosis 100% RICA (pt prev reported h/o 61%), LICA 60-73%, bilat ECA 50%,  Coronary artery disease involving native coronary artery of native heart with other form of angina pectoris  Delirium, unspecified   Chronic obstructive airway disease with asthma  permanent cardiac pacemaker SOB (shortness of breath)  Obesity (BMI 30.0-34.9), unspecified  Anxiety associated with hyperbaric oxygen therapy  Who presents for follow   Previously seen by Farrel Conners in 2018, Dr. Caryl Comes and Dr. Clayborn Bigness on Eliquis for anticoagulation   He reports having generalized weakness  Refused appt with CHF clinic Does not feel that he needs to see Kr. Caryl Comes, EP for pacer check Saw Caryl Comes 03/2016  In the hospital 05/01/2017 Copd exacerbation Hospital records reviewed with the patient in detail Treated with steroids EF35-40%noted on most recent echocardiogram acute complicated Enterococcus faecalis UTI, treated  Recommendation was to start BiPAP or trilogy device for home-  Blood pressure low today,no symptom Sometimes feels like he hurts all over First thing in the [place] insomnia  Seen by pulmonary today Bronchitis, started on levaquin 7 days Recent sputum culture from 05/01/17 grew Pseudomonas.  EKG personally reviewed by myself on todays visit V- Paced rhythm rate 61 bpm   PMH:   has a past medical history of Anginal pain ([name]), Anxiety, Arthritis, Asthma, Bilateral claudication of lower limb ([name]), Carotid stenosis, Cataract of left eye, Cataract, right, COPD  (chronic obstructive pulmonary disease) ([place]), Coronary artery disease, Dysrhythmia, Essential hypertension, Hyperkalemia, Hyperlipidemia, Myocardial infarction ([name]) (2015), Paroxysmal atrial flutter ([name]), Prostate cancer ([name]), Pulmonary nodule, right, Rectal cancer ([name]), Seizures ([name]), Shingles, SSS (sick sinus syndrome) ([name]), and Syncope and collapse.  PSH:    Past Surgical History:  Procedure Laterality Date  . ABLATION  10/31/2013   CTI ablation by Dr Caryl Comes for atrial flutter  . ATRIAL FLUTTER ABLATION N/A 10/31/2013   Procedure: ATRIAL FLUTTER ABLATION;  Surgeon: [name], MD;  Location: [facility] CATH LAB;  Service: Cardiovascular;  Laterality: N/A;  . CARDIAC CAT[facility]ATION  [facility]   [facility]  . CARDIAC CATHETERIZATION  01/2013   [facility]  . CARDIAC CATHETERIZATION  03/2014   [facility]  . CARDIAC CATHETERIZATION N/A 11/02/2014   Procedure: Left Heart Cath and Cors/Grafts Angiography;  Surgeon: [name], MD;  Location: [place] CV LAB;  Service: Cardiovascular;  Laterality: N/A;  . CARDIAC CATHETERIZATION N/A 11/02/2014   Procedure: Coronary Stent Intervention;  Surgeon: [name], MD;  Location: [place] CV LAB;  Service: Cardiovascular;  Laterality: N/A;  . CARDIAC CATHETERIZATION N/A 11/03/2014   Procedure: Left Heart Cath and Coronary Angiography;  Surgeon: [name], MD;  Location: [place] CV LAB;  Service: Cardiovascular;  Laterality: N/A;  . CARDIAC SURGERY  2011   Stents placed   . CATARACT EXTRACTION Left   . CATARACT EXTRACTION W/PHACO Left 06/01/2014   Procedure: CATARACT EXTRACTION PHACO AND INTRAOCULAR LENS PLACEMENT (IOC);  Surgeon: [name], MD;  Location: [name] ORS;  Service: Ophthalmology;  Laterality: Left;  . cataract  surgery  2012  . colon surgery     cancer removal  . COLON SURGERY     colostomy  . COLONOSCOPY WITH PROPOFOL N/A 05/24/2016   Procedure: COLONOSCOPY WITH PROPOFOL;  Surgeon: [name], MD;  Location[facility]  ENDOSCOPY;  Service: Endoscopy;  Laterality: N/A;  . CORONARY ANGIOPLASTY    . CORONARY ARTERY BYPASS GRAFT  02/13/2017   CABG x 3 @ [facility]   . INSERT / REPLACE / REMOVE PACEMAKER  2011   just a pacer  . KNEE ARTHROSCOPY Right 1980's  . LEFT HEART CATH AND CORONARY ANGIOGRAPHY N/A 02/08/2017   Procedure: LEFT HEART CATH AND CORONARY ANGIOGRAPHY;  Surgeon: [name], MD;  Location: [place] CV LAB;  Service: Cardiovascular;  Laterality: N/A;  . PACEMAKER PLACEMENT  01/17/2010  . TOTAL KNEE ARTHROPLASTY Right 01/14/2016   Procedure: TOTAL KNEE ARTHROPLASTY;  Surgeon: [name], MD;  Location: [name] ORS;  Service: Orthopedics;  Laterality: Right;    Current Outpatient Medications  Medication Sig Dispense Refill  . ALPRAZolam (XANAX) 0.25 MG tablet Take 1 tablet by mouth 2 (two) times daily as needed.    [name] amiodarone (PACERONE) 200 MG tablet Take 200 mg by mouth daily.    [name] apixaban (ELIQUIS) 5 MG TABS tablet Take 5 mg by mouth 2 (two) times daily.    [name] arformoterol (BROVANA) 15 MCG/2ML NEBU Take 2 mLs (15 mcg total) by nebulization 2 (two) times daily. Take 2 mLs (15 mcg total) by nebulization 2 (two) times daily. DX code 493.20 Every morning and 1800 360 mL 3  . aspirin EC 81 MG tablet Take 81 mg by mouth daily.    [name] atorvastatin (LIPITOR) 80 MG tablet Take 1 tablet (80 mg total) by mouth daily. 90 tablet 3  . benzonatate (TESSALON) 100 MG capsule Take 1 capsule (100 mg total) by mouth 3 (three) times daily as needed for cough. 20 capsule 0  . budesonide (PULMICORT) 0.5 MG/2ML nebulizer solution Take 2 mLs (0.5 mg total) by nebulization 2 (two) times daily. DX code 493.20 Every morning and 1800 360 mL 3  . Cholecalciferol (VITAMIN D) 2000 UNITS tablet Take 2,000 Units by mouth daily.    . furosemide (LASIX) 40 MG tablet Take 1 tablet (40 mg total) by mouth 2 (two) times daily. 60 tablet 0  . ipratropium (ATROVENT) 0.06 % nasal spray Place 2 sprays into the nose 4 (four) times  daily. 15 mL 1  . levalbuterol (XOPENEX) 0.63 MG/3ML nebulizer solution Take 3 mLs (0.63 mg total) by nebulization every 4 (four) hours as needed for wheezing or shortness of breath. 3 mL 2  . levofloxacin (LEVAQUIN) 500 MG tablet Take 1 tablet (500 mg total) by mouth daily. 7 tablet 0  . lisinopril (PRINIVIL,ZESTRIL) 2.5 MG tablet Take 1 tablet (2.5 mg total) by mouth daily. 30 tablet 0  . meclizine (ANTIVERT) 12.5 MG tablet Take 1-2 tablets by mouth 3 (three) times daily as needed.    . metoprolol tartrate (LOPRESSOR) 25 MG tablet Take 0.5 tablets (12.5 mg total) by mouth 2 (two) times daily. 60 tablet 2  . nitroGLYCERIN (NITROSTAT) 0.4 MG SL tablet Place 1 tablet (0.4 mg total) under the tongue every 5 (five) minutes as needed for chest pain. 25 tablet 3  . omeprazole (PRILOSEC) 40 MG capsule Take 40 mg by mouth daily.    [name] PARoxetine (PAXIL) 30 MG tablet Take 30 mg by mouth daily.     . predniSONE (DELTASONE) 20 MG  tablet Take 1 tablet (20 mg total) by mouth daily. 90 tablet 3  . protein supplement shake (PREMIER PROTEIN) LIQD Take 325 mLs (11 oz total) by mouth daily. 90 Can 3  . sulfamethoxazole-trimethoprim (BACTRIM DS,SEPTRA DS) 800-160 MG tablet Take 1 tablet by mouth daily. 90 tablet 4   No current facility-administered medications for this visit.      Allergies:   Amoxicillin-pot clavulanate and Other   Social History:  The patient  reports that he quit smoking about 8 years ago. His smoking use included cigarettes. He has a 30.00 pack-year smoking history. He has never used smokeless tobacco. He reports that he does not drink alcohol or use drugs.   Family History:   family history includes Cancer in his mother; Heart attack in his father; Heart disease in his father; Hyperlipidemia in his father and mother; Hypertension in his father and mother.    Review of Systems: Review of Systems  Constitutional: Positive for malaise/fatigue.  Respiratory: Positive for cough and shortness  of breath.   Cardiovascular: Negative.   Gastrointestinal: Negative.   Musculoskeletal: Negative.        Leg weakness  Neurological: Negative.   Psychiatric/Behavioral: The patient has insomnia.   All other systems reviewed and are negative.    PHYSICAL EXAM: VS:  BP (!) 85/54 (BP Location: Left Arm, Patient Position: Sitting, Cuff Size: Normal)   Pulse 61   Ht 5\' 9"  (1.753 m)   Wt 197 lb (89.4 kg)   BMI 29.09 kg/m  , BMI Body mass index is 29.09 kg/m. GEN: Well nourished, well developed, in no acute distress , presenting in a wheelchair HEENT: normal  Neck: no JVD, carotid bruits, or masses Cardiac: RRR; no murmurs, rubs, or gallops,no edema  Respiratory:  Moderately decreased breath sounds throughout, rales at the bases b/l normal work of breathing GI: soft, nontender, nondistended, + BS MS: no deformity or atrophy  Skin: warm and dry, no rash Neuro:  Strength and sensation are intact Psych: euthymic mood, full affect    Recent Labs: 03/12/2017: TSH 10.191 04/18/2017: Magnesium 1.9 04/30/2017: ALT 48; B Natriuretic Peptide 769.0; Hemoglobin 10.6; Platelets 274 05/01/2017: BUN 38; Creatinine, Ser 1.12; Potassium 4.0; Sodium 141    Lipid Panel No results found for: CHOL, HDL, LDLCALC, TRIG    Wt Readings from Last 3 Encounters:  05/18/17 197 lb (89.4 kg)  05/18/17 197 lb 6.4 oz (89.5 kg)  05/16/17 199 lb 3.2 oz (90.4 kg)       ASSESSMENT AND PLAN:  Paroxysmal atrial flutter (HCC) - Plan: EKG 12-Lead V-paced rhythm Continue amiodarone and eliquis BID  SSS (sick sinus syndrome) (HCC) Pacer, We will help arrange yearly visit with Dr. Caryl Comes when he comes in to see pulmonary in 3 months.  Atherosclerosis of native coronary artery of native heart with stable angina pectoris ([place]) Currently with no symptoms of angina. No further workup at this time. Continue current medication regimen. F/u with Dr. Clayborn Bigness (regular cardiologist)  Stenosis of carotid artery,  unspecified laterality B/l dz, long smoking hx  Other hyperlipidemia Continue lipitor 80 daily  Essential hypertension Blood pressure low, No sx Continue med, metoprolol lisinopril  Chronic respiratory failure with hypoxia and hypercapnia (HCC) Severe, end stage disease Followed by pulmonary   Total encounter time more than 45 minutes  Greater than 50% was spent in counseling and coordination of care with the patient   Disposition:   F/U  3 months with Dr. Jolyn Nap, EP   Orders  Placed This Encounter  Procedures  . EKG 12-Lead     Signed, Esmond Plants, M.D., Ph.D. 05/18/2017  [place], [place]

## 2017-05-17 NOTE — Progress Notes (Signed)
Subjective:    Patient ID: Donald Juarez, male    DOB: March 20, 1945, 73 y.o.   MRN: [id number]  Synopsis: Donald Juarez first saw the [facility] clinic in 2014 for COPD after smoking 1 PPD for 30 years and quitting in 2010.  He has frequent exacerbations.    03/2013 Full PFT> Ratio 53% FEV1 1.89 (65% pred, 13% change), TLC 5.83 L (93% pred), DLCO 16.6 (69% pred) Quit tobacco 2010 Recent Cardiac Stent 11/16  CC: chronic SOB, follow up COPD +cough and chest congestion  HPI Patient is a 72 year old male with a history of severe COPD.  He normally sees Dr. Mortimer Juarez.  He thus far has had 6 hospital admissions in the first 3 months of this year for various cardio/respiratory issues including COPD exacerbation, unstable angina, pulmonary edema with congestive heart failure. He has had considerable confusion about his medications in the past.  After his most recent hospitalization he was discharged to rehab, he has been on Symbicort and Spiriva, brovana, and pulmicort nebs.  He was asked to continue prednisone 20 mg daily and Bactrim DS 1 tab daily.     Current Outpatient Medications:  .  ALPRAZolam (XANAX) 0.25 MG tablet, Take 1 tablet by mouth 2 (two) times daily as needed., Disp: , Rfl:  .  amiodarone (PACERONE) 200 MG tablet, Take 200 mg by mouth daily., Disp: , Rfl:  .  ampicillin (PRINCIPEN) 500 MG capsule, Take 1 capsule (500 mg total) by mouth 4 (four) times daily., Disp: 10 capsule, Rfl: 0 .  apixaban (ELIQUIS) 5 MG TABS tablet, Take 5 mg by mouth 2 (two) times daily., Disp: , Rfl:  .  arformoterol (BROVANA) 15 MCG/2ML NEBU, Take 2 mLs (15 mcg total) by nebulization 2 (two) times daily. Take 2 mLs (15 mcg total) by nebulization 2 (two) times daily. DX code 493.20 Every morning and 1800, Disp: 360 mL, Rfl: 3 .  aspirin EC 81 MG tablet, Take 81 mg by mouth daily., Disp: , Rfl:  .  atorvastatin (LIPITOR) 80 MG tablet, Take 1 tablet (80 mg total) by mouth daily., Disp: 90 tablet, Rfl:  3 .  benzonatate (TESSALON) 100 MG capsule, Take 1 capsule (100 mg total) by mouth 3 (three) times daily as needed for cough., Disp: 20 capsule, Rfl: 0 .  budesonide (PULMICORT) 0.5 MG/2ML nebulizer solution, Take 2 mLs (0.5 mg total) by nebulization 2 (two) times daily. DX code 493.20 Every morning and 1800, Disp: 360 mL, Rfl: 3 .  budesonide-formoterol (SYMBICORT) 160-4.5 MCG/ACT inhaler, Inhale 2 puffs into the lungs 2 (two) times daily., Disp: , Rfl:  .  Cholecalciferol (VITAMIN D) 2000 UNITS tablet, Take 2,000 Units by mouth daily., Disp: , Rfl:  .  furosemide (LASIX) 40 MG tablet, Take 1 tablet (40 mg total) by mouth 2 (two) times daily., Disp: 60 tablet, Rfl: 0 .  ipratropium (ATROVENT) 0.06 % nasal spray, Place 2 sprays into the nose 4 (four) times daily., Disp: 15 mL, Rfl: 1 .  levalbuterol (XOPENEX) 0.63 MG/3ML nebulizer solution, Take 3 mLs (0.63 mg total) by nebulization every 4 (four) hours as needed for wheezing or shortness of breath., Disp: 3 mL, Rfl: 2 .  lisinopril (PRINIVIL,ZESTRIL) 2.5 MG tablet, Take 1 tablet (2.5 mg total) by mouth daily., Disp: 30 tablet, Rfl: 0 .  meclizine (ANTIVERT) 12.5 MG tablet, Take 1-2 tablets by mouth 3 (three) times daily as needed., Disp: , Rfl:  .  metoprolol tartrate (LOPRESSOR) 25 MG tablet, Take 0.5  tablets (12.5 mg total) by mouth 2 (two) times daily., Disp: 60 tablet, Rfl: 2 .  nitroGLYCERIN (NITROSTAT) 0.4 MG SL tablet, Place 1 tablet (0.4 mg total) under the tongue every 5 (five) minutes as needed for chest pain., Disp: 25 tablet, Rfl: 3 .  omeprazole (PRILOSEC) 40 MG capsule, Take 40 mg by mouth daily., Disp: , Rfl:  .  PARoxetine (PAXIL) 30 MG tablet, Take 30 mg by mouth daily. , Disp: , Rfl:  .  predniSONE (DELTASONE) 20 MG tablet, Take 1 tablet (20 mg total) by mouth daily., Disp: 90 tablet, Rfl: 3 .  protein supplement shake (PREMIER PROTEIN) LIQD, Take 325 mLs (11 oz total) by mouth daily., Disp: 90 Can, Rfl: 3 .   sulfamethoxazole-trimethoprim (BACTRIM DS,SEPTRA DS) 800-160 MG tablet, Take 1 tablet by mouth daily., Disp: 90 tablet, Rfl: 4 .  Tiotropium Bromide Monohydrate (SPIRIVA RESPIMAT) 1.25 MCG/ACT AERS, Inhale 2 puffs into the lungs 2 (two) times daily., Disp: 12 g, Rfl: 3  Review of Systems  Constitutional: Positive for fatigue. Negative for chills and fever.  HENT: Positive for congestion, postnasal drip and rhinorrhea.   Respiratory: Positive for cough, shortness of breath and wheezing. Negative for chest tightness.   Cardiovascular: Negative for chest pain, palpitations and leg swelling.  Genitourinary: Positive for difficulty urinating and frequency.  Neurological: Negative for dizziness.  All other systems reviewed and are negative.      Objective:   Physical Exam  Constitutional: He is oriented to person, place, and time. He appears well-developed and well-nourished. No distress.  HENT:  Mouth/Throat: No oropharyngeal exudate.  Eyes: EOM are normal.  Neck: Neck supple.  Cardiovascular: Normal rate, regular rhythm and normal heart sounds.  No murmur heard. Pulmonary/Chest: No stridor. No respiratory distress. He has wheezes. He has rales.  Musculoskeletal: Normal range of motion. He exhibits no edema.  Neurological: He is alert and oriented to person, place, and time.  Skin: Skin is warm. He is not diaphoretic.  Psychiatric: He has a normal mood and affect.   There were no vitals taken for this visit.   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 Patient's regimen seems to be  at maximum level of therapy.   Severe end-stage COPD with recurrent exacerbations. Patient remains at high risk of death and re-hospitalization due to multiple recurrent COPD exacerbations. Recent sputum culture from 05/01/17 grew Pseudomonas. -Continue prednisone 20 mg daily indefinitely, as well as Bactrim DS 1 tablet daily, in the hopes of reducing future exacerbation and hospitalization risk. - We will treat with a course of Levaquin to treat the patient's recent Pseudomonas. --Discontinue symbicort.    Deconditioned state -Recommend increased daily activity and exercise  Chronic rhinitis, not improved with Flonase. -Given prescription for nasal ipratropium to be used 3 times daily.  Excessive daytime sleepiness. - Epworth score is 11 will consider sleep study when stable.     Meds ordered this encounter  Medications  . levofloxacin (LEVAQUIN) 500 MG tablet    Sig: Take 1 tablet (500 mg total) by mouth daily.    Dispense:  7 tablet    Refill:  0   Return in about 3 months (around 08/17/2017).

## 2017-05-18 ENCOUNTER — Ambulatory Visit (INDEPENDENT_AMBULATORY_CARE_PROVIDER_SITE_OTHER): Payer: Medicare Other | Admitting: Internal Medicine

## 2017-05-18 ENCOUNTER — Encounter: Payer: Self-pay | Admitting: Internal Medicine

## 2017-05-18 ENCOUNTER — Encounter: Payer: Self-pay | Admitting: Cardiovascular Disease

## 2017-05-18 ENCOUNTER — Ambulatory Visit (INDEPENDENT_AMBULATORY_CARE_PROVIDER_SITE_OTHER): Payer: Medicare Other | Admitting: Cardiovascular Disease

## 2017-05-18 VITALS — BP 85/54 | HR 61 | Ht 69.0 in | Wt 197.0 lb

## 2017-05-18 VITALS — BP 102/62 | HR 65 | Ht 69.0 in | Wt 197.4 lb

## 2017-05-18 DIAGNOSIS — I4892 Unspecified atrial flutter: Secondary | ICD-10-CM

## 2017-05-18 DIAGNOSIS — I10 Essential (primary) hypertension: Secondary | ICD-10-CM | POA: Diagnosis not present

## 2017-05-18 DIAGNOSIS — E7849 Other hyperlipidemia: Secondary | ICD-10-CM

## 2017-05-18 DIAGNOSIS — I25118 Atherosclerotic heart disease of native coronary artery with other forms of angina pectoris: Secondary | ICD-10-CM

## 2017-05-18 DIAGNOSIS — I495 Sick sinus syndrome: Secondary | ICD-10-CM | POA: Diagnosis not present

## 2017-05-18 DIAGNOSIS — J441 Chronic obstructive pulmonary disease with (acute) exacerbation: Secondary | ICD-10-CM

## 2017-05-18 DIAGNOSIS — J9611 Chronic respiratory failure with hypoxia: Secondary | ICD-10-CM

## 2017-05-18 DIAGNOSIS — J449 Chronic obstructive pulmonary disease, unspecified: Secondary | ICD-10-CM

## 2017-05-18 DIAGNOSIS — I6529 Occlusion and stenosis of unspecified carotid artery: Secondary | ICD-10-CM | POA: Diagnosis not present

## 2017-05-18 DIAGNOSIS — J9612 Chronic respiratory failure with hypercapnia: Secondary | ICD-10-CM | POA: Diagnosis not present

## 2017-05-18 MED ORDER — LEVOFLOXACIN 500 MG PO TABS: 500.0000 mg | ORAL_TABLET | Freq: Every day | ORAL | 0 refills | Status: AC

## 2017-05-18 NOTE — Patient Instructions (Addendum)
Discontinue symbicort.  Continue brovana, pulmicort, xopenex nebs.  Will give you a course of antibiotic for infection.   Stay on prednisone and bactrim indefinitely.

## 2017-05-18 NOTE — Patient Instructions (Signed)
Medication Instructions:   No medication changes made  Labwork:  No new labs needed  Testing/Procedures:  No further testing at this time   Follow-Up: It was a pleasure seeing you in the office today. Please call us if you have new issues that need to be addressed before your next appt.  [phone]  Your physician wants you to follow-up in:  Follow up with Dr. Caryl Comes on next visit with pulmonary   If you need a refill on your cardiac medications before your next appointment, please call your pharmacy.  For educational health videos Log in to : [email or web address] Or : [email or web address], password : [email or web address]

## 2017-05-21 ENCOUNTER — Ambulatory Visit: Payer: Medicare Other

## 2017-07-20 ENCOUNTER — Encounter: Payer: Self-pay | Admitting: Cardiology

## 2017-08-08 ENCOUNTER — Telehealth: Payer: Self-pay | Admitting: Internal Medicine

## 2017-08-08 NOTE — Telephone Encounter (Signed)
Called pt to schedule yearly carotid ultrasound. Spoke with pt son, Roderic Palau, [facility]) and he states pt is in [facility] hospitalized and will not be able at this time to schedule. He states he is not sure when he will be available to schedule, but will call us when and if he can have this test performed.

## 2017-08-14 ENCOUNTER — Encounter: Payer: Medicare Other | Admitting: Internal Medicine

## 2017-09-30 DEATH — deceased

## 2017-11-14 ENCOUNTER — Ambulatory Visit: Payer: Medicare Other | Admitting: Urology

## 2019-10-11 IMAGING — DX DG CHEST 1V PORT
1 series · 1 of 1 positions shown · non-contrast
Comparison: 04/27/2016

CLINICAL DATA: Left-sided chest pain.

EXAM:
PORTABLE CHEST 1 VIEW

[chest ap]
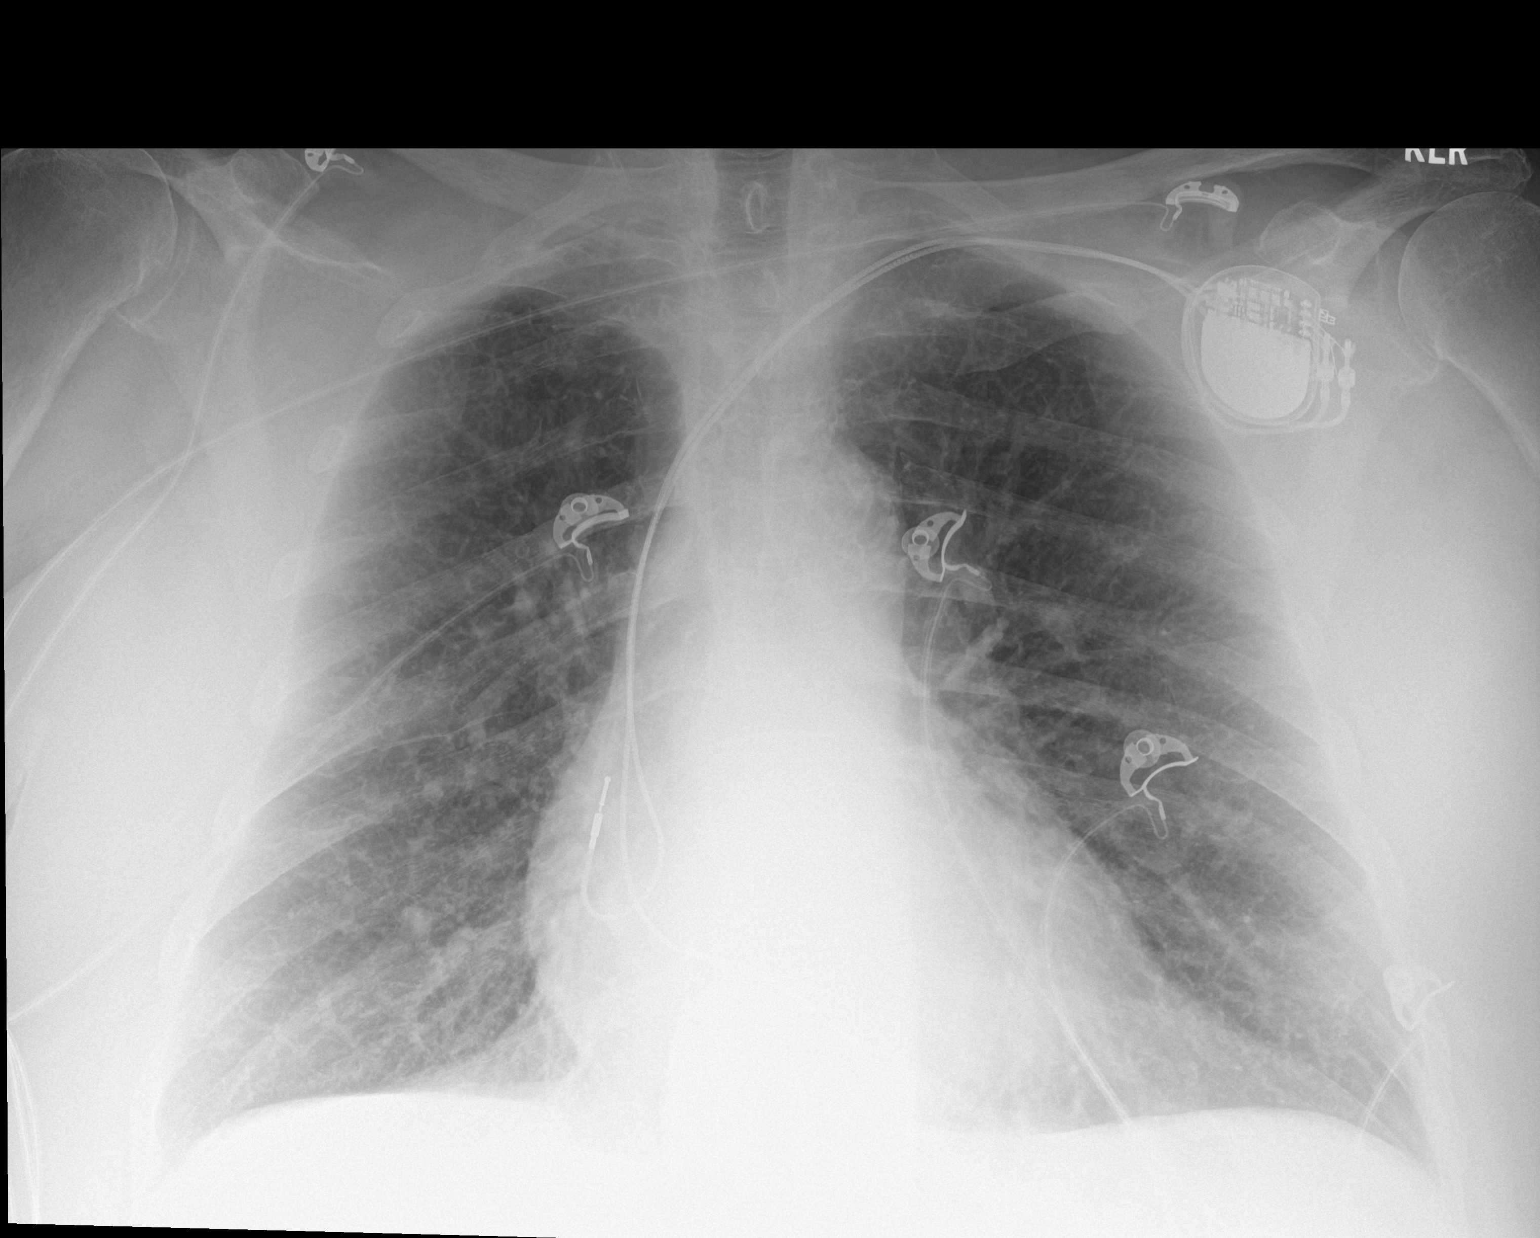

[1 of 1 positions shown; findings below may reference images not displayed]

FINDINGS: Dual lead cardiac pacemaker.

Cardiomediastinal silhouette is normal. Mediastinal contours appear
intact. Calcific atherosclerotic disease of the aorta.

There is no evidence of focal airspace consolidation, pleural
effusion or pneumothorax.

Osseous structures are without acute abnormality. Soft tissues are
grossly normal.
IMPRESSION: Calcific atherosclerotic disease of the aorta.

Otherwise no evidence of acute cardiopulmonary disease.
